# Patient Record
Sex: Female | Born: 1935 | Race: White | Hispanic: No | Smoking: Never smoker
Health system: Southern US, Community
[De-identification: ages and names within clinical notes are randomized; demographics above are authoritative.]

## PROBLEM LIST (undated history)

## (undated) DIAGNOSIS — S42109A Fracture of unspecified part of scapula, unspecified shoulder, initial encounter for closed fracture: Secondary | ICD-10-CM

## (undated) DIAGNOSIS — E119 Type 2 diabetes mellitus without complications: Secondary | ICD-10-CM

## (undated) DIAGNOSIS — R0600 Dyspnea, unspecified: Secondary | ICD-10-CM

## (undated) DIAGNOSIS — I420 Dilated cardiomyopathy: Secondary | ICD-10-CM

## (undated) DIAGNOSIS — I340 Nonrheumatic mitral (valve) insufficiency: Secondary | ICD-10-CM

## (undated) DIAGNOSIS — I429 Cardiomyopathy, unspecified: Secondary | ICD-10-CM

## (undated) DIAGNOSIS — C801 Malignant (primary) neoplasm, unspecified: Secondary | ICD-10-CM

## (undated) DIAGNOSIS — J9620 Acute and chronic respiratory failure, unspecified whether with hypoxia or hypercapnia: Secondary | ICD-10-CM

## (undated) DIAGNOSIS — D62 Acute posthemorrhagic anemia: Secondary | ICD-10-CM

## (undated) DIAGNOSIS — Z853 Personal history of malignant neoplasm of breast: Secondary | ICD-10-CM

## (undated) DIAGNOSIS — N184 Chronic kidney disease, stage 4 (severe): Secondary | ICD-10-CM

## (undated) DIAGNOSIS — S72002A Fracture of unspecified part of neck of left femur, initial encounter for closed fracture: Secondary | ICD-10-CM

## (undated) DIAGNOSIS — N185 Chronic kidney disease, stage 5: Secondary | ICD-10-CM

## (undated) DIAGNOSIS — I480 Paroxysmal atrial fibrillation: Secondary | ICD-10-CM

## (undated) DIAGNOSIS — I509 Heart failure, unspecified: Secondary | ICD-10-CM

## (undated) DIAGNOSIS — I4891 Unspecified atrial fibrillation: Secondary | ICD-10-CM

## (undated) DIAGNOSIS — R221 Localized swelling, mass and lump, neck: Secondary | ICD-10-CM

## (undated) DIAGNOSIS — N179 Acute kidney failure, unspecified: Secondary | ICD-10-CM

## (undated) DIAGNOSIS — W19XXXA Unspecified fall, initial encounter: Secondary | ICD-10-CM

## (undated) DIAGNOSIS — S2239XA Fracture of one rib, unspecified side, initial encounter for closed fracture: Secondary | ICD-10-CM

## (undated) DIAGNOSIS — I10 Essential (primary) hypertension: Secondary | ICD-10-CM

## (undated) DIAGNOSIS — E785 Hyperlipidemia, unspecified: Secondary | ICD-10-CM

## (undated) DIAGNOSIS — R7989 Other specified abnormal findings of blood chemistry: Secondary | ICD-10-CM

## (undated) DIAGNOSIS — I4821 Permanent atrial fibrillation: Secondary | ICD-10-CM

## (undated) DIAGNOSIS — I5022 Chronic systolic (congestive) heart failure: Secondary | ICD-10-CM

## (undated) HISTORY — DX: Essential (primary) hypertension: I10

## (undated) HISTORY — DX: Chronic systolic (congestive) heart failure: I50.22

## (undated) HISTORY — DX: Localized swelling, mass and lump, neck: R22.1

## (undated) HISTORY — PX: ABDOMINAL HYSTERECTOMY: SHX81

## (undated) HISTORY — DX: Heart failure, unspecified: I50.9

## (undated) HISTORY — PX: BREAST LUMPECTOMY: SHX2

## (undated) HISTORY — DX: Permanent atrial fibrillation: I48.21

## (undated) HISTORY — DX: Cardiomyopathy, unspecified: I42.9

## (undated) HISTORY — PX: FRACTURE SURGERY: SHX138

## (undated) HISTORY — DX: Nonrheumatic mitral (valve) insufficiency: I34.0

## (undated) HISTORY — DX: Chronic kidney disease, stage 4 (severe): N18.4

## (undated) HISTORY — PX: JOINT REPLACEMENT: SHX530

---
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
## 1998-01-14 ENCOUNTER — Ambulatory Visit (HOSPITAL_COMMUNITY): Admission: RE | Admit: 1998-01-14 | Discharge: 1998-01-14 | Payer: Self-pay | Admitting: Gastroenterology

## 1999-03-09 ENCOUNTER — Encounter: Admission: RE | Admit: 1999-03-09 | Discharge: 1999-03-09 | Payer: Self-pay | Admitting: General Surgery

## 1999-04-14 ENCOUNTER — Encounter: Payer: Self-pay | Admitting: *Deleted

## 1999-04-14 ENCOUNTER — Encounter: Admission: RE | Admit: 1999-04-14 | Discharge: 1999-04-14 | Payer: Self-pay | Admitting: *Deleted

## 1999-04-27 ENCOUNTER — Ambulatory Visit (HOSPITAL_COMMUNITY): Admission: RE | Admit: 1999-04-27 | Discharge: 1999-04-27 | Payer: Self-pay | Admitting: Gastroenterology

## 1999-08-30 ENCOUNTER — Encounter: Admission: RE | Admit: 1999-08-30 | Discharge: 1999-08-30 | Payer: Self-pay | Admitting: General Surgery

## 1999-08-30 ENCOUNTER — Encounter: Payer: Self-pay | Admitting: General Surgery

## 2000-08-30 ENCOUNTER — Encounter: Admission: RE | Admit: 2000-08-30 | Discharge: 2000-08-30 | Payer: Self-pay | Admitting: Ophthalmology

## 2000-08-30 ENCOUNTER — Encounter: Payer: Self-pay | Admitting: General Surgery

## 2001-03-28 ENCOUNTER — Other Ambulatory Visit: Admission: RE | Admit: 2001-03-28 | Discharge: 2001-03-28 | Payer: Self-pay | Admitting: Obstetrics and Gynecology

## 2001-09-03 ENCOUNTER — Encounter: Payer: Self-pay | Admitting: General Surgery

## 2001-09-03 ENCOUNTER — Encounter: Admission: RE | Admit: 2001-09-03 | Discharge: 2001-09-03 | Payer: Self-pay | Admitting: General Surgery

## 2002-06-17 ENCOUNTER — Encounter: Admission: RE | Admit: 2002-06-17 | Discharge: 2002-06-17 | Payer: Self-pay | Admitting: Internal Medicine

## 2002-06-17 ENCOUNTER — Encounter: Payer: Self-pay | Admitting: Internal Medicine

## 2002-09-08 ENCOUNTER — Encounter: Payer: Self-pay | Admitting: General Surgery

## 2002-09-08 ENCOUNTER — Encounter: Admission: RE | Admit: 2002-09-08 | Discharge: 2002-09-08 | Payer: Self-pay | Admitting: General Surgery

## 2003-05-23 ENCOUNTER — Emergency Department (HOSPITAL_COMMUNITY): Admission: EM | Admit: 2003-05-23 | Discharge: 2003-05-23 | Payer: Self-pay | Admitting: Emergency Medicine

## 2003-06-09 ENCOUNTER — Encounter: Admission: RE | Admit: 2003-06-09 | Discharge: 2003-06-09 | Payer: Self-pay | Admitting: Internal Medicine

## 2003-08-10 ENCOUNTER — Encounter: Admission: RE | Admit: 2003-08-10 | Discharge: 2003-08-10 | Payer: Self-pay | Admitting: Internal Medicine

## 2003-08-12 ENCOUNTER — Inpatient Hospital Stay (HOSPITAL_COMMUNITY): Admission: AD | Admit: 2003-08-12 | Discharge: 2003-08-17 | Payer: Self-pay | Admitting: Internal Medicine

## 2003-08-31 ENCOUNTER — Encounter: Admission: RE | Admit: 2003-08-31 | Discharge: 2003-08-31 | Payer: Self-pay | Admitting: Internal Medicine

## 2003-09-09 ENCOUNTER — Encounter: Admission: RE | Admit: 2003-09-09 | Discharge: 2003-09-09 | Payer: Self-pay | Admitting: General Surgery

## 2003-10-20 ENCOUNTER — Inpatient Hospital Stay (HOSPITAL_COMMUNITY): Admission: RE | Admit: 2003-10-20 | Discharge: 2003-10-27 | Payer: Self-pay | Admitting: Orthopedic Surgery

## 2004-01-22 ENCOUNTER — Ambulatory Visit (HOSPITAL_COMMUNITY): Admission: RE | Admit: 2004-01-22 | Discharge: 2004-01-22 | Payer: Self-pay | Admitting: Orthopedic Surgery

## 2004-03-01 ENCOUNTER — Ambulatory Visit: Payer: Self-pay | Admitting: Physical Medicine & Rehabilitation

## 2004-03-01 ENCOUNTER — Inpatient Hospital Stay (HOSPITAL_COMMUNITY): Admission: RE | Admit: 2004-03-01 | Discharge: 2004-03-05 | Payer: Self-pay | Admitting: Orthopedic Surgery

## 2004-09-09 ENCOUNTER — Encounter: Admission: RE | Admit: 2004-09-09 | Discharge: 2004-09-09 | Payer: Self-pay | Admitting: General Surgery

## 2005-09-11 ENCOUNTER — Encounter: Admission: RE | Admit: 2005-09-11 | Discharge: 2005-09-11 | Payer: Self-pay | Admitting: General Surgery

## 2005-10-25 ENCOUNTER — Observation Stay (HOSPITAL_COMMUNITY): Admission: EM | Admit: 2005-10-25 | Discharge: 2005-10-27 | Payer: Self-pay | Admitting: Emergency Medicine

## 2005-10-25 ENCOUNTER — Ambulatory Visit: Payer: Self-pay | Admitting: Cardiology

## 2005-10-26 ENCOUNTER — Encounter (INDEPENDENT_AMBULATORY_CARE_PROVIDER_SITE_OTHER): Payer: Self-pay | Admitting: Cardiology

## 2005-11-04 ENCOUNTER — Ambulatory Visit (HOSPITAL_COMMUNITY): Admission: RE | Admit: 2005-11-04 | Discharge: 2005-11-04 | Payer: Self-pay | Admitting: Orthopedic Surgery

## 2005-11-14 ENCOUNTER — Ambulatory Visit: Payer: Self-pay

## 2005-11-17 ENCOUNTER — Ambulatory Visit: Payer: Self-pay | Admitting: Cardiology

## 2005-11-28 ENCOUNTER — Encounter: Admission: RE | Admit: 2005-11-28 | Discharge: 2005-11-28 | Payer: Self-pay | Admitting: Gastroenterology

## 2006-01-30 ENCOUNTER — Encounter (INDEPENDENT_AMBULATORY_CARE_PROVIDER_SITE_OTHER): Payer: Self-pay | Admitting: Specialist

## 2006-01-30 ENCOUNTER — Inpatient Hospital Stay (HOSPITAL_COMMUNITY): Admission: RE | Admit: 2006-01-30 | Discharge: 2006-02-06 | Payer: Self-pay | Admitting: Surgery

## 2006-02-06 ENCOUNTER — Ambulatory Visit: Payer: Self-pay | Admitting: Hematology and Oncology

## 2006-02-15 LAB — CBC WITH DIFFERENTIAL/PLATELET
BASO%: 0.7 % (ref 0.0–2.0)
Basophils Absolute: 0.1 10*3/uL (ref 0.0–0.1)
EOS%: 2.5 % (ref 0.0–7.0)
Eosinophils Absolute: 0.2 10*3/uL (ref 0.0–0.5)
HCT: 33.7 % — ABNORMAL LOW (ref 34.8–46.6)
LYMPH%: 25.3 % (ref 14.0–48.0)
MCHC: 34.2 g/dL (ref 32.0–36.0)
MCV: 84.1 fL (ref 81.0–101.0)
MONO#: 0.6 10*3/uL (ref 0.1–0.9)
MONO%: 7.5 % (ref 0.0–13.0)
RDW: 14.3 % (ref 11.3–14.5)
WBC: 8.4 10*3/uL (ref 3.9–10.0)
lymph#: 2.1 10*3/uL (ref 0.9–3.3)

## 2006-02-15 LAB — CEA: CEA: <0.5 ng/mL (ref 0.0–5.0)

## 2006-02-15 LAB — COMPREHENSIVE METABOLIC PANEL
ALT: <8 U/L (ref 0–40)
Albumin: 3.8 g/dL (ref 3.5–5.2)
BUN: 21 mg/dL (ref 6–23)
CO2: 19 mEq/L (ref 19–32)
Glucose, Bld: 95 mg/dL (ref 70–99)
Total Bilirubin: 0.4 mg/dL (ref 0.3–1.2)

## 2006-04-08 ENCOUNTER — Inpatient Hospital Stay (HOSPITAL_COMMUNITY): Admission: EM | Admit: 2006-04-08 | Discharge: 2006-04-12 | Payer: Self-pay | Admitting: Emergency Medicine

## 2006-05-24 ENCOUNTER — Ambulatory Visit: Payer: Self-pay | Admitting: Hematology and Oncology

## 2006-06-06 ENCOUNTER — Encounter: Admission: RE | Admit: 2006-06-06 | Discharge: 2006-06-06 | Payer: Self-pay | Admitting: Internal Medicine

## 2006-07-20 ENCOUNTER — Inpatient Hospital Stay (HOSPITAL_COMMUNITY): Admission: RE | Admit: 2006-07-20 | Discharge: 2006-07-27 | Payer: Self-pay | Admitting: Orthopaedic Surgery

## 2007-05-17 ENCOUNTER — Ambulatory Visit: Payer: Self-pay | Admitting: Cardiology

## 2010-06-19 ENCOUNTER — Encounter: Payer: Self-pay | Admitting: Hematology and Oncology

## 2010-06-19 ENCOUNTER — Encounter: Payer: Self-pay | Admitting: Gastroenterology

## 2010-10-11 NOTE — Assessment & Plan Note (Signed)
[facility] OFFICE NOTE   [name], [name]                        MRN:          [id number]  DATE:05/17/2007                            DOB:          1925/11/21    [name] is seen today for cardiac evaluation and cardiac clearance  for orthopedic surgery to her leg.  I had seen the patient in the past.  She had had some shortness of breath.  We were concerned, and ischemia  was ruled out.  She had a 2D echocardiogram in May 2007 showing good LV  function.  She also had a Myoview scan revealing no evidence of  ischemia.  She is not currently having any chest pain or shortness of  breath.  She is limited only by the discomfort in her left leg for which  she needs surgery.   ALLERGIES:  CODEINE.   MEDICATIONS:  Actonel, Celebrex, iron, metformin, multivitamin,  Micardis, hydrochlorothiazide, Carvedilol, Actos, Cardizem CD, and  lisinopril.   OTHER MEDICAL PROBLEMS:  See the list below.   REVIEW OF SYSTEMS:  As of today a Review of Systems is negative.  She is  not having any significant problems other than the leg discomfort  mentioned in the HPI.   PHYSICAL EXAMINATION:  VITAL SIGNS:  Blood pressure 170/70.  This is her  usual range, and she is on multiple medications.  Pulse 81.  GENERAL:  [name] oriented to person, time, and place.  Affect is  normal.  She is here with a family member today.  LUNGS:  Clear.  Respiratory effort is not labored.  CARDIAC:  Reveals S1, S2.  There are no clicks or significant murmurs.  ABDOMEN:  Soft.  There are no masses or bruits.  EXTREMITIES:  There is no peripheral edema.   EKG shows interventricular conduction delay and decreased anterior R  wave progression.  These are old, and there is no significant change.   PROBLEMS:  1. Hypertension being treated.  2. Diabetes treated.  3. History of breast cancer with lumpectomy and tamoxifen in the past.  4.  History of diverticulosis.  5. Osteoporosis.  6. History of uterine cancer status post hysterectomy.  7. Normal LV function by echocardiogram and no sign of ischemia by      nuclear scan.  8. Need for orthopedic surgery to her left leg.   The patient's cardiac status is stable.  She is cleared for surgery.     Carlena Bjornstad, MD, [facility]  Electronically Signed    JDK/MedQ  DD: 05/17/2007  DT: 05/18/2007  Job #: [id number]   cc:   Ardeen Jourdain, M.D.  Tacey Ruiz, M.D.

## 2010-10-14 NOTE — Op Note (Signed)
NAMEINDIYA, DARITY               ACCOUNT NO.:  [id number]   MEDICAL RECORD NO.:  [id number]          PATIENT TYPE:  AMB   LOCATION:  SDS                          FACILITY:  [facility]   PHYSICIAN:  Lind Guest. Ninfa Linden, M.D.DATE OF BIRTH:  July 12, 1925   DATE OF PROCEDURE:  07/24/2006  DATE OF DISCHARGE:                               OPERATIVE REPORT   PREOPERATIVE DIAGNOSIS:  Left distal femur fracture, nonunion status  post open reduction and internal fixation.   POSTOPERATIVE DIAGNOSIS:  Left distal femur fracture, nonunion status  post open reduction and internal fixation.   PROCEDURE:  1. Antibiotic bead removal, left distal femur fracture.  2. Irrigation and debridement of left distal femur fracture nonunion.  3. Revision osteosynthesis right distal femur fracture using Synthes      4.5 mm medial locking plate.  4. Allograft bone placed on the left distal femur fracture using      Actifuse 15 mL putty and OP1.   SURGEON:  Lind Guest. Ninfa Linden, M.D.   ANESTHESIA:  General.   ANTIBIOTICS:  600 mg IV clindamycin.   BLOOD LOSS:  250 mL.   COMPLICATIONS:  None.   INDICATIONS:  Briefly, Ms. Nakao is a 77 year old female who, back in  November, sustained an open left distal femur fracture that was a  periprosthetic fracture.  She underwent irrigation, debridement, open  reduction, and internal fixation.  At three months, she had collapsed  into a varus position in spite of a large periarticular locking plate  and showed no evidence of healing.  I took her to the operating room  four days ago where she underwent irrigation and debridement at the  fracture site and I found a large seroma. Cultures were taken at the  fracture site of the bone and the fluid as well as the knee and after  four days, all of these were negative.  There was rare white blood  cells, no organisms, and the cultures grew back negative.  She is  returning for repeat irrigation and debridement of the  wound and  possible plating depending on the findings. She has, otherwise, been  doing well and consents for surgery.   PROCEDURE DESCRIPTION:  After informed consent was obtained, the  appropriate left leg was marked, Ms. Owusu was brought to the operating  room and placed supine on the operating table.  General anesthesia was  then obtained. Her leg was prepped and draped with DuraPrep and sterile  drapes down to the ankle and a sterile stockinette was used. I placed a  sterile tourniquet around the leg at the upper thigh and the tourniquet  was inflated to 300 mmHg.  I went through the previous medial incision  and removed the staples.  I dissected down through the vastus medialis  and took this down as a flap. The muscle was very viable and contractile  with electrocautery.  I did encounter a large hematoma from my previous  surgery.  This was irrigated with pulsatile lavage and normal saline  solution to free up the blood clot. I did find some bleeders that were  cauterized. I then continued to have oozing and this was felt to be a  venous tourniquet so I removed the tourniquet from the leg in its  entirety after letting it down.  Hemostasis was then easily obtained.  I  then copiously irrigated the wound again with 3 liters normal saline  solution followed by 500 mL of bacitracin solution.  I curetted the  fracture site again and I did actually get some bleeding bone.  Once  this was accomplished, I was able to dissect further medial off the bone  trying to keep as much periosteum intact.  Again, I found no evidence of  gross purulence at the fracture site.  I next chose a narrow 4.5 mm  Synthes locking plate and used the plate benders to contour this to the  medial side of the distal femur.  This all verified under direct  fluoroscopic guidance.  Once I felt that the plate was an appropriate  fit, holding the leg straight, I worked on the fracture site further  with curetting. I  then packed the fracture site with 15 mL Actifuse bone  graft mixed with OP1 for stimulus. The plate was secured with three  locking screws proximally and two locking screws distally as well as a  cortical screw.  All screws got good purchase of the bone and under  direct fluoroscopic guidance, I had much improvement in the alignment of  the leg overall. There appeared to be fracture stability with range of  motion of the knee.  I then reapproximated the vastus medialis to the  quad tendon as a flap with interrupted #1 Vicryl suture followed by 0  Vicryl and the fascia over the vastus medialis, 2-0 Vicryl in the  subcutaneous tissue, and staples on the skin.  Xeroform followed by a  well padded sterile dressing were applied and the knee was placed in a  knee immobilizer. The foot remained perfused throughout the case with  the tourniquet down.  The patient was then awakened, extubated, and  taken to the recovery room in stable condition.  There were no  complications.  Final blood count was 250 mL and all final counts were  correct.  Postoperatively, I will have her non-weight bearing and I will  place her in a Bledsoe hinged knee brace and begin the rehabilitative  process.           ______________________________  Lind Guest. Ninfa Linden, M.D.     CYB/MEDQ  D:  07/24/2006  T:  07/24/2006  Job:  [id number]

## 2010-10-14 NOTE — Op Note (Signed)
[name], [name]                           ACCOUNT NO.:  [id number]   MEDICAL RECORD NO.:  [id number]                   PATIENT TYPE:  INP   LOCATION:  5041                                 FACILITY:  [facility]   PHYSICIAN:  Anderson Malta, M.D.                 DATE OF BIRTH:  [birth year]   DATE OF PROCEDURE:  10/20/2003  DATE OF DISCHARGE:                                 OPERATIVE REPORT   PREOPERATIVE DIAGNOSIS:  Right knee arthritis.   POSTOPERATIVE DIAGNOSIS:  Right knee arthritis.   PROCEDURE:  Right total knee replacement.   SURGEON:  Anderson Malta, M.D.   ASSISTANT:  Emogene Morgan. Lavender, M.D.   ESTIMATED BLOOD LOSS:  150 mL.   DRAINS:  None.   TOURNIQUET TIME:  1 hour and 39 minutes at 300 mmHg.   COMPONENTS:  Posterior stabilized size 5 patella, 7 femur, 7 tibia with 10  mm spacer.   DESCRIPTION OF PROCEDURE:  The patient was brought to the operating room  where general endotracheal anesthesia was induced.  Preoperative IV  antibiotics were administered.  The right leg was prepped with duraprep  solution and draped in a sterile manner, Ioban was used to cover the  operative field.  The leg was elevated and exsanguinated with the esmarch  wrap, tourniquet was inflated.  An anterior midline incision was made, skin  and subcutaneous tissue was sharply divided, a median parapatellar  arthrotomy was made. The precise location of this arthrotomy was marked with  #1 Vicryl suture a superior medial border of the patella.  The patella was  then everted, fat pad was partially excised, the patellofemoral ligament was  released, elevation of the medial soft tissue sleeve was performed and soft  tissue was cleared from the anterior distal aspect of the femur for  visualization of the anterior cut.  Intramedullary alignment was then used  in order to make the initial distal femoral cut which was placed at 5  degrees valgus.  Following distal femoral resection, the chamfer cuts were  made, the femur was sized to a size 7, chamfer cutting block was then placed  and the anterior posterior chamfer cuts were made.  At this time, a leveling  cut on the tibia was made, intramedullary alignment was used to make a cut  on the tibia perpendicular to the mechanical axis of the tibial shaft.  The  cut was made, spacing block in extension  __________ indicated adequate cut.  At this time, a box cut was made on the femur. The patella was then measured  at 20 mm thickness, 10 mm resection was then performed. A size patella trial  was placed, keel punch on the tibia was performed after trial components and  rotations were marked. The trial components then positioned, the patella had  excellent tracking with __________ technique and good overall mechanical  appearance and had excellent range  of motion with no lift off or full  flexion. Good collateral ligament clearly was noted in both full extension  and full flexion.  At this time, the trial components were removed.  The  knee joint was thoroughly irrigated. The components were cemented into  position beginning with 5/7 tibia, 7 femur, 5 patella, followed by 10  crossfire polyethylene spacer. At this time, the tourniquet was released,  bleeding points were counter controlled using electrocautery.  The  polyethylene spacer was removed as there did appear to be bleeding from the  posterior aspect of the knee.  With the poly removed, there was no active  arterial bleeding seen as the bleeding was coming from the intramedullary  femoral alignment pole. This was packed with bone graft.  A second  polyethylene spacer was placed. The knee was then again thoroughly irrigated  and closed over a Hemovac drain using #1 Vicryl figure-of-eight sutures  followed interrupted inverted 2-0 Vicryl sutures and skin staples.  The  patient was then placed in a bulky dressing and knee immobilizer.  She  tolerated the procedure well without immediate  complications.                                               Anderson Malta, M.D.    GSD/MEDQ  D:  10/20/2003  T:  10/20/2003  Job:  [id number]

## 2010-10-14 NOTE — H&P (Signed)
NAMERHEMY, LANNOM               ACCOUNT NO.:  [id number]   MEDICAL RECORD NO.:  [id number]          PATIENT TYPE:  EMS   LOCATION:  MAJO                         FACILITY:  [facility]   PHYSICIAN:  Cherene Altes, M.D.DATE OF BIRTH:  1925-07-13   DATE OF ADMISSION:  10/25/2005  DATE OF DISCHARGE:                                HISTORY & PHYSICAL   PRIMARY CARE PHYSICIAN:  Ardeen Jourdain, M.D.   CHIEF COMPLAINT:  Dyspnea on exertion.   HISTORY OF PRESENT ILLNESS:  Ms. [name] is a very pleasant 77-year-  old female who lives independently here in [place].  She was in her usual  state of health until this morning.  She was up working around the house  when she experienced the abuse onset of severe shortness of breath.  There  was no associated chest pain or pressure.  She just simply felt that she  could not catch her breath.  This made her also feel very anxious.  She  denies diaphoresis.  She denies nausea or vomiting.  She does report that  she possibly has some numbness in the left arm, but states that she has been  having lots of shoulder trouble on the left for the last few months, which  is being evaluated by her orthopedist.  Her shortness of breath persisted  despite her sitting down.  She then summoned her family, who ultimately  presented with her to [facility] emergency room.  After approximately 3  hours, the patient's symptoms resolved spontaneously.  No medical  intervention in the emergency room changed the course of her symptoms  whatsoever.  There was no associated nausea or vomiting.  There has been no  abdominal pain, no low back pain.  There have been no focal neurologic  deficits.  The patient has not had recent similar symptoms.  She has noted  no lower extremity edema.  As previously stated, she has been up and around  and highly functional without any difficulty until onset of symptoms this  morning.   REVIEW OF SYSTEMS:  Comprehensive review  of systems is unremarkable, with  the exception of the positive ailments noted in the history of present  illness above.   PAST MEDICAL HISTORY:  1.  Hypertension.  2.  Diabetes mellitus.  3.  History of breast cancer status post lumpectomy and Tamoxifen with no      requirement for mastectomy or chemotherapy or radiation.  4.  Diverticulosis.  5.  Osteoporosis/osteoarthritis status post right total knee arthroplasty in      May of 2005 and left total knee arthroplasty in October of 2005.  6.  Status post hysterectomy secondary to uterine cancer.   MEDICATIONS:  1.  AcipHex 20 mg p.o. daily.  2.  Micardis 40 mg p.o. daily.  3.  Cardizem LA 240 mg daily.  4.  Actonel dose unknown.  5.  Metformin 1000 mg b.i.d.  6.  Celebrex 200 mg daily.  7.  Darvocet-N 100/650 one q.h.s.  8.  Iron sulfate 324 mg b.i.d.  9.  Actos 30 mg daily.  ALLERGIES:  CODEINE.   FAMILY HISTORY:  Noncontributory secondary to advanced age.   SOCIAL HISTORY:  [name] a widow.  She lives in [place] in her own  home.  She does have a 88-something-year-old grandson who lives with her,  but his assistance is not required for her day-to-day living.  She does not  smoke, nor has she ever.  She does not drink alcohol.   DATA REVIEWED:  D-dimer is normal.  pH of 7.55, PCO2 of 26, PO2 of 106.  Sodium, potassium, chronic, bicarbonate, BUN, creatinine and serum glucose  are all normal.  Point of care cardiac markers are negative x2.  Chest x-ray  reveals no acute disease as reviewed by this examiner.  A 12-lead  electrocardiogram reveals T wave inversions in leads III and aVF.  These  were not present on a previous electrocardiogram dating February 25, 2004.  Otherwise, there is no evidence of ST elevation or depression on review of  the electrocardiogram.   PHYSICAL EXAMINATION:  VITAL SIGNS:  Temperature of 97.4, blood pressure  188/91, heart rate 88, respiratory rate 18, O2 saturation 98% on room  air.  GENERAL:  Well-developed, well-nourished female in no acute respiratory  distress at the present time.  HEENT:  Normocephalic and atraumatic.  Pupils equal, round and reactive to  light and accommodation.  Extraocular muscles intact bilaterally.  OC/OP  clear.  NECK:  No JVD, no lymphadenopathy.  LUNGS:  Clear to auscultation bilaterally without wheezes or rhonchi.  CARDIOVASCULAR:  Regular rate and rhythm without murmur, gallop, or rub with  normal S1 and S2.  ABDOMEN:  Thin, nontender, nondistended, soft.  Bowel sounds present.  No  hepatosplenomegaly, no rebound, no ascites.  EXTREMITIES:  No significant cyanosis, clubbing, edema in bilateral lower  extremities.  NEUROLOGIC:  Alert and oriented x4.  There was 5/5 strength in bilateral  upper and lower extremities.  Intact sensation to touch throughout.  No  Babinski.   IMPRESSION AND PLAN:  1.  Dyspnea on exertion.  Ms. [name] has multiple risk factors for      coronary artery disease to include advanced age, hypertension and      diabetes.  Her lipid status is not known.  My concern is the patient's      dyspnea represents an angina equivalent in this 77 year old diabetic.      Given her electrocardiogram changes, I feel it is prudent to admit the      patient and to rule her out.  Given evident of left ventricular      hypertrophy on electrocardiogram, I will also obtain an echocardiogram.      This will further provide information useful in risk stratification for      coronary artery disease.  If there is evidence of a wall motion      abnormality or if cardiac enzymes prove to be positive, will consult      cardiology for possible inpatient risk stratification.  The patient will      be treated with aspirin.  There is no evidence of acute heart failure,      and therefore she will also be treated with a beta blocker.  In that D-     dimer, the likelihood of a pulmonary embolism is exceedingly low.       Furthermore, my pretest probability for pulmonary embolism is low in      this patient, who is very active.  She will be treated with  Lovenox for      DVT prophylaxis, but I do not feel that a spiral CT is warranted at the      present time.  2.  Uncontrolled hypertension.  I suspect that the patient's hypertension is      presently elevated because of significant anxiety related to her      presence in the emergency room and her symptoms today.  She admits to      feeling real anxious about her present status.  I will add a beta      blocker as noted above for the possibility of an acute pulmonary      syndrome.  I will not, however, adjust her antihypertensive regimen      further.  Will keep her on her usual home regimen, and we will follow      her blood pressure trend during this hospital stay.  We will avoid acute      over-correction.  3.  Diabetes mellitus.  The patient's serum glucose is 76 at the present      time.  She does report that she is closely adherent to a diabetic diet.      We will check a hemoglobin A1C to assess if adjustment of her baseline      regimen is required.  She will be covered with her baseline medications      plus sliding scale insulin.  4.  History of anemia.  The patient does report that she has been seen by a      gastroenterologist and has had a colonoscopy within the last 2 years.      She is on iron therapy, but denies any history of GI bleeding.  I will      check a CBC, and further evaluation will be carried out as appropriate.      Cherene Altes, M.D.  Electronically Signed     JTM/MEDQ  D:  10/25/2005  T:  10/25/2005  Job:  [id number]   cc:   Ardeen Jourdain, M.D.  Fax: [phone]

## 2010-10-14 NOTE — Op Note (Signed)
[name], [name]               ACCOUNT NO.:  [id number]   MEDICAL RECORD NO.:  [id number]          PATIENT TYPE:  INP   LOCATION:  0005                         FACILITY:  [facility]   PHYSICIAN:  Isabel Caprice. Hassell Done, MD  DATE OF BIRTH:  [birth year]   DATE OF PROCEDURE:  01/30/2006  DATE OF DISCHARGE:                                 OPERATIVE REPORT   PREOPERATIVE DIAGNOSIS:  Gastric adenocarcinoma.   POSTOPERATIVE DIAGNOSIS:  Gastric adenocarcinoma along greater curvature   PROCEDURE:  Laparoscopy, open partial gastrectomy and endoscopy.   SURGEON:  Isabel Caprice. Hassell Done, MD   ASSISTANT:  Jonne Ply, MD.   ANESTHESIA:  General endotracheal.   ESTIMATED BLOOD LOSS:  Minimal.   DESCRIPTION OF PROCEDURE:  [name] is a 77 year old white female who  presented with nausea and vomiting and had endoscopy by Dr. Oletta Lamas who  biopsied a mass along the greater curvature which proved to be an adenoca.  CT scan did not show much of the mass of the greater curvature.  The  possibility of some gallstones seen on the study and we discussed possibly  doing a cholecystectomy if the gallbladder appeared to have had chronic  inflammatory changes.   The patient was taken to room one, given general anesthesia.  I entered the  abdomen through the left upper quadrant with an OptiView and found a lot of  adhesions in the left upper quadrant well away from any previous surgery.  I  went ahead and put in multiple other ports and began trying to tease these  down, but they were very densely adherent and I did look up on the stomach  and could not obviously see the tumor, and felt at this point I was going to  need and go ahead and palpate her stomach, so I elected to make a midline  incision and take down her adhesions.  There was a little serosal tear of  the transverse colon which I repaired with interrupted 4-0 Vicryl sutures.  I palpated the stomach and felt like I could feel the mass. I went  ahead and  broke out scrub, passed the endoscope from above and did an upper endoscopy  and saw the mass on the greater curvature and we put a suture through it.   I then did decompress the stomach and withdrew the scope and from the  outside, got a palpable idea of the size of this mass and went along from  the greater curvature, taking it down with harmonic scalpel and then I did a  gastric resection using multiple applications using the Endo-GIA with the  bronze cartridge.  The specimen was opened on the table and it appeared to  have good margins around this.  It was sent for permanent sections.  Stomach  was not bleeding.  I looked at the gallbladder and was a nice robin's egg  blue with no adhesions whatsoever.  No palpable stones were noted.  I  elected not to do a cholecystectomy.   Sponge and needle counts were reported as correct.  The wound was closed  with #  1 PDS from above and below and tied in the middle.  The wounds were  irrigated and closed with staples.  The patient tolerated the procedure well  and was taken to the recovery room in satisfactory condition.      Isabel Caprice Hassell Done, MD  Electronically Signed     MBM/MEDQ  D:  01/30/2006  T:  01/30/2006  Job:  [id number]   cc:   Ardeen Jourdain, M.D.  Fax: Harrells Rolla Flatten., M.D.  Fax: [phone]

## 2010-10-14 NOTE — Op Note (Signed)
NAMEHALLEL, MELLAND               ACCOUNT NO.:  [id number]   MEDICAL RECORD NO.:  [id number]          PATIENT TYPE:  INP   LOCATION:  [id number]                         FACILITY:  [facility]   PHYSICIAN:  Anderson Malta, M.D.    DATE OF BIRTH:  [birth year]   DATE OF PROCEDURE:  03/01/2004  DATE OF DISCHARGE:                                 OPERATIVE REPORT   PREOPERATIVE DIAGNOSIS:  Left knee arthritis.   POSTOPERATIVE DIAGNOSIS:  Left knee arthritis.   PROCEDURE:  Left total knee replacement using Osteonics cemented posterior-  stabilized femur, size 7 cemented tibia, 12 mm __________ polyethylene  spacer, and size 5 patella.   SURGEON:  Anderson Malta, M.D.   ASSISTANT:  Emogene Morgan. Laurina Bustle, M.D.   ANESTHESIA:  General endotracheal.   ESTIMATED BLOOD LOSS:  150 mL.   DRAINS:  Hemovac x1.   TOURNIQUET TIME:  1 hour 31 minutes at 300 mmHg.   PROCEDURE IN DETAIL:  The patient was brought to the operating room, where  general endotracheal anesthesia was induced, preoperative IV antibiotics  were administered, and the left leg was prepped with Duraprep solution and  draped in a sterile manner.  It was covered with an [place].  The leg was  elevated and exsanguinated with the Esmarch wrap.  The tourniquet was  inflated.  An anterior approach to the knee was utilized with the knee over  a bolster.  A median parapatellar arthrotomy was made.  The precise location  of this arthrotomy was marked with a #1 Vicryl suture.  The patellofemoral  ligament was released.  Osteophytes were removed from the femur and tibia.  ACL and PCL were removed.  The fat pad was partially excised.  Soft tissue  was elevated from the posteromedial aspect of the tibial plateau back to the  semimembranosus bursa.  Soft tissue was removed from the anterior distal  aspect of the femur.  Distal cut of 12 mm was then made and at 5 degrees of  valgus.  The chamfer cutting guide was then placed for a size 7 femur, which  did  not match.  The guide was placed parallel to the epicondylar axis.  Chamfer and condylar cuts were then made and the collateral ligaments  protected.  The tibia was then subluxated forward with the posterior  retractor.  The tibial cut was then made by making a 2 mm resection for  release of the affected lateral plateau.  This cut was made perpendicular to  the mechanical axis using intramedullary alignment.  A box cut was then made  on the femur, the PCL remnant was excised.  Posterior stripping of the  capsule was performed.  Trial components were placed and the patient noted  to have full extension with the 12 mm spacer in place with excellent  collateral stability at 0, 30, and 90 degrees.  At this time the tibial keel  punch was performed after rotation was marked.  The patella was then  repaired by making a free hand cut of 10 mm off the 22 mm thick patella.  A  size 5 trial button was placed, and again the patella had excellent tracking  and a good range of motion with no lift-off.  Trial components were removed.  True components were then cemented into position with excess cement removed.  Cement was allowed to harden.  The knee was again taken through a range of  motion and found to have excellent stability and patellar tracking.  At this  time the tourniquet was released, bleeding points encountered were  controlled using electrocautery.  A Hemovac drain was placed.  Thorough  irrigation was performed.  The 12 mm true spacer was placed.  It was placed  onto the dry tibial base plate.  At this time the medial patellar arthrotomy  was closed using interrupted #1 Vicryl sutures.  The skin was closed using  interrupted inverted 2-0 Vicryl suture and skin staples.  The patient was  placed in a knee immobilizer.  She tolerated the procedure well without  immediate complications.       GSD/MEDQ  D:  03/02/2004  T:  03/02/2004  Job:  [id number]

## 2010-10-14 NOTE — H&P (Signed)
[name], YAMBOR                           ACCOUNT NO.:  [id number]   MEDICAL RECORD NO.:  [id number]                   PATIENT TYPE:  INP   LOCATION:  5703                                 FACILITY:  [facility]   PHYSICIAN:  Ardeen Jourdain, M.D.            DATE OF BIRTH:  [birth year]   DATE OF ADMISSION:  08/12/2003  DATE OF DISCHARGE:                                HISTORY & PHYSICAL   CHIEF COMPLAINT:  Shortness of breath and cough.   HISTORY OF PRESENT ILLNESS:  This is a 77 year old female who was seen in  the primary care setting on March 14 and diagnosed with community-acquired  pneumonia.  She was sent home on Levaquin and has not gotten any better.  The family calls today with continued problems with weakness and shortness  of breath.  As discussed in the office previously we are going to admit her  for IV antibiotics and hydration as she failed outpatient treatment for  community-acquired pneumonia.  She continues to be short of breath, cough,  slight purulent production, poor appetite, weakness.  She denies any chest  pain.   REVIEW OF SYSTEMS:  Positive for shortness of breath, cough, sputum  production.  She denies any chest pain, dysuria, melena, abdominal pain,  rashes, musculoskeletal complaints.  Ten plus systems were reviewed with the  patient.   PAST MEDICAL HISTORY:  Significant for breast cancer, diabetes mellitus type  2, hypertension, diverticulosis and osteopenia.   PAST SURGICAL HISTORY:  The patient had a hysterectomy.   SOCIAL HISTORY:  She is a widow.  She lives with her child.  Nonsmoker,  nondrinker.   ALLERGIES:  CODEINE.   FAMILY HISTORY:  Noncontributory.   MEDICATIONS:  1. Celebrex 200 mg p.o. daily.  2. Metformin 500 mg p.o. b.i.d.  3. Glyburide 5 mg p.o. daily.  4. Elavil 25 mg q.h.s.  5. Actonel 35 mg q. week.   PHYSICAL EXAMINATION:  VITAL SIGNS:  Temperature 98.4.  Blood pressure  150/80. Pulse 80.  Weight 140 pounds.  GENERAL:   The patient was awake, oriented, mild respiratory distress.  HEENT:  Pupils, equal, round and reactive to light.  The extraocular motion  intact.  Supple neck, no JVD, no bruit, no thyromegaly.  LUNGS:  Decreased breath sounds, right base.  No wheezing noted.  HEART:  Slightly tachycardic without murmur, gallop or rub.  ABDOMEN:  Soft, nondistended and nontender, positive bowel sounds.  No  hepatosplenomegaly.  MUSCULOSKELETAL:  Grossly within normal limits.  No clubbing, cyanosis or  edema noted.  NEUROLOGIC:  Grossly nonfocal.   REVIEW OF STUDIES:  Outpatient x-rays showed right lower lobe infiltrate and  early left lower lobe infiltrate.   IMPRESSION/PLAN:  [name] is being admitted for IV Zosyn and IV fluids  after failing outpatient therapy for community-acquired pneumonia.  We will  get routine laboratory studies on the patient.  We will hold off  on blood  cultures since she has received two days of Levaquin. We will get a followup  chest x-ray in the morning and start her on breathing treatments and IV  Zosyn and Mucinex to loosen secretions.  We will start her on 2 liters nasal  cannula and get routine lab studies on the patient.                                                Ardeen Jourdain, M.D.    DEH/MEDQ  D:  08/12/2003  T:  08/14/2003  Job:  [id number]

## 2010-10-14 NOTE — Discharge Summary (Signed)
[name], [name]               ACCOUNT NO.:  [id number]   MEDICAL RECORD NO.:  [id number]          PATIENT TYPE:  INP   LOCATION:  5002                         FACILITY:  [facility]   PHYSICIAN:  Lind Guest. Ninfa Linden, M.D.DATE OF BIRTH:  June 23, 1925   DATE OF ADMISSION:  04/08/2006  DATE OF DISCHARGE:  04/12/2006                               DISCHARGE SUMMARY   ADMITTING DIAGNOSIS:  Left open distal femur periarticular fracture  (around total knee replacement).   DISCHARGE DIAGNOSIS:  Left open distal femur periarticular fracture  (around total knee replacement).   PROCEDURES:  1. Irrigation and debridement of left open distal femur fracture.  2. Open reduction and internal fixation of left open periarticular      distal femur fracture using a locking plate.   HOSPITAL COURSE:  Briefly, Ms. Halseth is a 77 year old female with a  history of bilateral total knee replacement.  She was ambulating at the  [facility] on the day of admission when she sustained a mechanical fall.  She did not have a syncopal episode.  She said she just actually lost  her footing.  She sustained a periarticular distal femur fracture around  her total knee replacement.  This was an open fracture and was taken by  EMS to [facility] Emergency Department.  Assessment was made, and she  was found to have at least a 2 x 2-cm open wound over her anterior thigh  with protruding exposed bone.  It was recommended she undergo internal  fixation due to the nature of the fracture.  She was taken to the  operating room on the day of admission where she underwent the  aforementioned procedures.  She tolerated this well without  complications.  Postoperatively, she was admitted to the hospital for  physical therapy as well as occupational  therapy and IV antibiotics due  to the open nature of her fracture.  By the day of discharge, she was  working well with therapy with non weightbearing on her left leg with  minimal  motion of her knee.  The wound was found to be clean, dry, and  intact.   DISPOSITION:  Home.   DISCHARGE INSTRUCTIONS:  While she is at home, she will keep a close eye  over her incisions and make sure this stays clean, dry, and intact.  I  will let home health physical therapy work with her for just ambulation  and gait training with no weightbearing on her left leg until further  notice.  A followup appointment will be established in two weeks in the  office.   DISCHARGE MEDICATIONS:  1. See medical reconciliation sheet for continuing on home medications      as before.  2. Darvocet for pain as stated.  3. Robaxin for pain as needed.  4. Take 1 aspirin 325 mg daily until further notice.     Lind Guest. Ninfa Linden, M.D.  Electronically Signed    CYB/MEDQ  D:  08/04/2006  T:  08/04/2006  Job:  [id number]

## 2010-10-14 NOTE — Discharge Summary (Signed)
NAMESAMORA, HAUX               ACCOUNT NO.:  [id number]   MEDICAL RECORD NO.:  [id number]          PATIENT TYPE:  INP   LOCATION:  [place]                         FACILITY:  [facility]   PHYSICIAN:  Isabel Caprice. Hassell Done, MD  DATE OF BIRTH:  1925/06/14   DATE OF ADMISSION:  01/30/2006  DATE OF DISCHARGE:  02/06/2006                                 DISCHARGE SUMMARY   CCS#:  [id number]   PROCEDURE:  January 30, 2006 laparoscopy, open, partial gastrectomy and  endoscopy.   DISCHARGE DIAGNOSES:  Gastric adenocarcinoma along the greater curvature  status post resection. Moderately differentiated margins free of tumor.  Seven perigastric lymph nodes free of tumor.   HOSPITAL COURSE:  [name] is a 77 year old lady who had the above  mentioned lesion identified by Dr. Oletta Lamas on endoscopy for nausea. She  underwent the above mentioned operation and did well. She was moved up to  the floor where she had a little bit of trouble with some nausea that  progressed and by postoperative day 7 was ready to go home. She was seen by  Dr. Gunnar Bulla Magrinat and arrangements were made for her to followup with Dr.  Sophronia Simas. She was given prescriptions for Darvocet and Phenergan  tablets at the time of discharge and instructed to return to the office in 2-  3 weeks. Her staples were removed. Her condition is good.      Isabel Caprice Hassell Done, MD  Electronically Signed     MBM/MEDQ  D:  02/06/2006  T:  02/07/2006  Job:  [id number]   cc:   Ardeen Jourdain, M.D.  Fax: [place]. Odogwu, M.D.  Fax: [place]. Rolla Flatten., M.D.  Fax: [phone]

## 2010-10-14 NOTE — Op Note (Signed)
[name], [name]               ACCOUNT NO.:  [id number]   MEDICAL RECORD NO.:  [id number]          PATIENT TYPE:  INP   LOCATION:  2550                         FACILITY:  [facility]   PHYSICIAN:  Lind Guest. Ninfa Linden, M.D.DATE OF BIRTH:  Aug 04, 1925   DATE OF PROCEDURE:  07/20/2006  DATE OF DISCHARGE:                               OPERATIVE REPORT   PREOPERATIVE DIAGNOSIS:  Left distal femur fracture nonunion, status  post open reduction and internal fixation of open fracture.   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   PROCEDURE:  1. Irrigation and debridement of left distal femur nonunion site.  2. Antibiotic bead placement in left distal femur fracture site.   SURGEON:  Lind Guest. Ninfa Linden, M.D.   ASSISTANT:  Epimenio Foot, P.A.-C.   ANESTHESIA:  General.   BLOOD LOSS:  Minimal.   ANTIBIOTICS:  1 gram IV Ancef followed by 1 gram IV vancomycin after  cultures obtained.   FINDINGS:  Large seroma collection and obvious nonunion.  Cultures  pending.   COMPLICATIONS:  None.   INDICATIONS:  Briefly, [name] is a 77 year old who sustained an open  left distal femur fracture in November of this past year.  She underwent  a thorough irrigation and debridement and then plating of the distal  femur.  This was a periprosthetic fracture just proximal to a total knee  replacement.  She had been doing well postoperatively, with slowly  advancing her weightbearing status with walking with a walker.  At an 8-  week standpoint x-rays were obtained and still showed anatomic alignment  and mild evidence of healing.  I decided to advance her weightbearing  status hoping this would help her lay down new bone as well.  She  returned to the office this week complaining of knee pain but no thigh  pain, but she was ambulating with an obvious varus deformity at her  knee.  X-rays did show a nonunion of the  fracture site and varus  collapse as well.  I talked to her and the family at length about the  possibility that this may be an infectious nonunion and brought her to  the operating room for assessment of the fracture site with potential  for bone grafting and medial plating depending on what the finding of  surgery are.  The risks and benefits of this were explained.  The  patient well-understood, and she and her family agreed to proceed with  surgery.   DESCRIPTION OF PROCEDURE:  After informed consent was obtained, the  appropriate left leg was marked.  [name] was brought to the  operating room and placed supine on the operating table.  General  anesthesia was then obtained.  Her left leg was prepped and draped from  the thigh down to the ankle with DuraPrep and sterile drapes.  A sterile  stockinette was used as well as a sterile tourniquet.  The tourniquet  was placed high on the thigh, and then the leg was wrapped with Esmarch.  The tourniquet was inflated to 300 mmHg pressure.  A time-out was  called, and she was identified as the correct patient and the correct  extremity.   I then took a medial approach to the distal femur and tried to keep my  incisions 6 to 7 cm from the previous midline and lateral incisions.  As  I dissected down to the vastus, I then teased the vastus medialis  obliquus muscle off of the bone and right away encountered a large  seroma around the fracture.  I obtained cultures immediately, and she  had been given Ancef prior to the cultures and just as she was brought  back to the operating room.  I also took cultures of the fracture site  and found that at the fracture site as I dissected down that this was an  obvious nonunion, and there was minimal callus formation.  I curetted  extensively the inside and outside of the bone and all around the  fracture site itself.  I then gave her a gram of vancomycin after  cultures were obtained of the soft  tissues and bone.  Using pulsatile  lavage, I irrigated the leg and the fracture site with 500 mL of  bacitracin solution followed by 3 liters of normal saline solution and  finally another 500 mL of bacitracin solution.  I then decided to place  antibiotic beads within the fracture site, and so cement was mixed with  vancomycin and tobramycin and placed on a Prolene suture.  Small beads  were then placed all around the inside of the fracture site and on the  outside of this as well.  I then reapproximated the sleeve using an  interrupted #1 PDS suture followed by 0 PDS in the fascial plane and 2-0  PDS in the subcutaneous tissue.  Staples were used to close the skin.  I  placed Xeroform and a well-padded sterile dressing.  The tourniquet was  let down, and the foot pinked up nicely, and there was a nice pulse.  I  then placed the knee in a knee immobilizer as well.   She was awakened, extubated, and taken to the recovery room in stable  condition.  Postoperatively, we will follow cultures and keep her on IV  antibiotics and then make a decision about more definitive fixation in  the coming days.           ______________________________  Lind Guest. Ninfa Linden, M.D.     CYB/MEDQ  D:  07/20/2006  T:  07/20/2006  Job:  [id number]

## 2010-10-14 NOTE — Discharge Summary (Signed)
NAMESOSHA, EMSLEY                           ACCOUNT NO.:  [id number]   MEDICAL RECORD NO.:  [id number]                   PATIENT TYPE:  INP   LOCATION:  [id number]                                 FACILITY:  [facility]   PHYSICIAN:  Anderson Malta, M.D.                 DATE OF BIRTH:  [birth year]   DATE OF ADMISSION:  10/20/2003  DATE OF DISCHARGE:  10/27/2003                                 DISCHARGE SUMMARY   DISCHARGE DIAGNOSES:  Right knee arthritis.   SECONDARY DIAGNOSES:  None.   OPERATIONS AND PROCEDURES:  Right total knee replacement Oct 20, 2003.   HOSPITAL COURSE:  [name] is a 77 year old patient with right knee  arthritis who presented for right total knee replacement Oct 20, 2003.  She  underwent this procedure without complications.  She tolerated the procedure  well without immediate complications.  She was transferred to the recovery  room.  Motor and sensory functions intact on postop day #1.  She was  mobilized with physical therapy and placed on the CPM machine.  She was  started on Coumadin for DVT prophylaxis.  She was transfused 1 units of  packed red blood cells for hemoglobin of 8.2 on Oct 22, 2003.  The patient  had an otherwise unremarkable recovery.  She was mobilizing well with  physical therapy and went home with home health care,  The patient's  incision was intact.   Discharge medications include previous medications plus Coumadin and  Percocet for pain.  She will follow up with me in one week for staple  removal.  Continue weightbearing as tolerated, CPM machine at home, as well  as home health care.                                                Anderson Malta, M.D.    GSD/MEDQ  D:  12/08/2003  T:  12/08/2003  Job:  [id number]

## 2010-10-14 NOTE — Discharge Summary (Signed)
NAMETRENNY, STENSETH                           ACCOUNT NO.:  [id number]   MEDICAL RECORD NO.:  [id number]                   PATIENT TYPE:  INP   LOCATION:  5703                                 FACILITY:  [facility]   PHYSICIAN:  Ardeen Jourdain, M.D.            DATE OF BIRTH:  03-Dec-1925   DATE OF ADMISSION:  08/12/2003  DATE OF DISCHARGE:  08/17/2003                                 DISCHARGE SUMMARY   DISCHARGE DIAGNOSES:  1. Pneumonia.  2. Diabetes.  3. Hypertension.  4. Anemia of chronic disease.  5. History of breast cancer.   DISCHARGE MEDICATIONS:  1. Celebrex 200 mg p.o. daily.  2. Metformin 500 mg p.o. b.i.d.  3. Glyburide 5 mg p.o. daily.  4. Elavil 25 mg q.h.s.  5. Actimol 35 mg q. week.  6. Levaquin 500 mg p.o. daily times seven days.  7. Cardizem LA 180 mg p.o. q.h.s.   CONSULTATIONS:  None.   PROCEDURES:  None.   FOLLOWUP:  Follow up with Dr. Tomasa Hosteller in 10 to 14 days.   HOSPITAL COURSE:  The patient was admitted on August 12, 2003 after failing  outpatient treatment for community-acquired pneumonia.  She was brought to  the primary care office and noted to be increasing short of breath, weak  with continued purulent production.  She was admitted for IV hydration and  antibiotics.  The patient was admitted and started on Zosyn and IV fluids.  Mucinex to loosen secretions.  During the course of hospitalization, her  white count did slowly resolve, her x-rays did improve with time.  She was  noted to be hypokalemic.  This was replaced.  Her blood pressure was noted  to be elevated and additional medications were added.  The patient was also  found to be anemic and this was felt to be anemia due to chronic disease.  She had guaiac-negative stools.  The patient was discharged in stable  condition, feeling much better, and able to once again eat a regular meal.  She is to follow up as directed.  She was discharged in stable condition.                        Ardeen Jourdain, M.D.    DEH/MEDQ  D:  09/08/2003  T:  09/09/2003  Job:  [id number]

## 2010-10-14 NOTE — Consult Note (Signed)
NAME:  [name], [name]                   ACCOUNT NO.:  o   MEDICAL RECORD NO.:  [id number]          PATIENT TYPE:  INP   LOCATION:  [id number]                         FACILITY:  [facility]   PHYSICIAN:  Dola Argyle, M.D.     DATE OF BIRTH:  [birth year]   DATE OF CONSULTATION:  10/27/2005  DATE OF DISCHARGE:                                   CONSULTATION   Ms. Rochefort is a very pleasant 77 year old lady.  She was admitted after  feeling acute shortness of breath.  She also has a jittery sensation and  feels very anxious.  She was admitted to be sure that she had not had a  pulmonary embolus or an acute MI. Since being here, she has been stable.  We  have an EKG from 2005, and at that time, her T-waves were upright.  Currently she has T-wave inversions.  She does have left ventricular  hypertrophy.  The EKGs have not evolved since being in the hospital.  Her  enzymes are negative.  [name] stable.   The patient tells me today that she had an episode several months ago where  she had this jittery feeling.  At that time, she did not have shortness of  breath.  She rested and improved and this resolved.   Patient does have risk factors for coronary disease including hypertension  and diabetes and of course her age.   ALLERGIES:  CODEINE.   MEDICATIONS ON ADMISSION:  1.  AcipHex 20.  2.  Micardis 40.  3.  Cardizem LA 240.  4.  Actonel.  5.  Metformin 1000 b.i.d.  6.  Celebrex 200 daily.  7.  Darvocet-N nightly.  8.  Iron sulfate.  9.  Actos 30.   OTHER MEDICAL PROBLEMS:  See the complete list below.   SOCIAL HISTORY:  Patient is widowed.  She has family living in town and I  spoke with her daughter during my interview with the patient recently.  She  does not smoke.   FAMILY HISTORY:  Family history is noncontributory related to this patient  at this time.   REVIEW OF SYSTEMS:  She has had no fever or chills.  She does have some  visual problems and uses reading glasses.  She  has had no major skin  problems.  The shortness of breath, as mentioned, occurred with admission.  With review once again, she has not had marked shortness of breath recently  prior to admission.  The patient has some numbness in her left arm  intermittently.  She had some nocturia.  She has arthralgias of her neck.  Otherwise, her review of systems is negative.   PHYSICAL EXAMINATION:  GENERAL APPEARANCE:  [name] oriented to  person, time and place and her affect is normal.  VITAL SIGNS:  Blood pressure 170/74 with respirations of 19, temperature  97.5.  HEENT:  No xanthelasma. She has normal extraocular motion.  There are no  carotid bruits.  There is no jugular venous distension.  CARDIOVASCULAR:  There is an S1 with an S2.  There are no clicks or  significant murmurs.  ABDOMEN:  Soft.  There are no masses or bruits.  There is no significant  peripheral edema.   EKG from 2005 revealed increased voltage but upright T-waves.  EKG this  admission showed some lateral T-wave inversions. The T-wave changes are new  since 2005 but there is no significant evolution of the T-waves here in the  hospital.   Cardiac enzymes are normal so far.  BNP is slightly elevated but not  particularly abnormal for her age at 74.  D-dimer is normal at 0.31 and TSH  is normal.  BUN is 18 with creatinine 1.  Hemoglobin 13.1.   A 2-D echo was done.  It shows an ejection fraction of 55 to 60%.  There is  mild left ventricular hypertrophy. There is some upper septal thickening but  there is no evidence of left ventricular outflow tract obstruction.   PROBLEMS:  1.  Hypertension.  2.  Diabetes.  3.  History of breast carcinoma, status post lumpectomy and tamoxifen in the      past.  4.  History of diverticulosis.  5.  Osteoporosis.  6.  Uterine cancer, status post hysterectomy.  7.  *Current episode of feeling jittery and shortness of breath suddenly.      There has been no evidence of an  myocardial infarction.  She has good      left ventricular function.  As mentioned, she had an episode a month or      two ago at which time she also felt jittery.  At that time, she      thought that her walking was a little unstable.  She then improved.      She does have abnormal EKGs.  However, she does have significant      hypertension.  At this point, I am not convinced that this episode was      an anginal equivalent.  I have carefully thought through the type of      testing that she needs.  The studies can be done as an outpatient.  We      will arrange for an adenosine Myoview scan and I will see her in my      office for follow-up.  I may consider an event recorder.  We will try to      follow her symptoms more carefully over time to get a better feel for      how to proceed with her work-up.  I spoke with the patient's daughter by      telephone when I was in the room with the patient.  She understands the      plan.           ______________________________  Dola Argyle, M.D.     JK/MEDQ  D:  10/27/2005  T:  10/27/2005  Job:  [id number]   cc:   Ardeen Jourdain, M.D.  Fax: [phone]

## 2010-10-14 NOTE — Consult Note (Signed)
[name], [name]               ACCOUNT NO.:  [id number]   MEDICAL RECORD NO.:  [id number]          PATIENT TYPE:  INP   LOCATION:  4731                         FACILITY:  [facility]   PHYSICIAN:  Mobolaji B. Maia Petties, M.D.DATE OF BIRTH:  09/05/1925   DATE OF CONSULTATION:  DATE OF DISCHARGE:                                 CONSULTATION   PRIMARY CARE PHYSICIAN:  [name], M.D.   REFERRING PHYSICIAN:  Dr. Ninfa [name].   REASON FOR CONSULTATION:  Postoperative medical management of diabetes  and hypertension.   HISTORY OF PRESENT ILLNESS:  [name] is a pleasant 77 year old  Caucasian female who unfortunately visited her late husband grave site  in the [place] this afternoon.  She accidentally tripped over while  walking and sustained open fracture involving periprosthetic left knee  distal femoral fracture.  She has been taken to the OR by Dr. Ninfa [name].  We have been requested to manage medical problems which include diabetes  mellitus and hypertension.  Most recently, the patient had partial  gastrectomy by Dr. Hassell Done in early September of 2007.  She was found to  have gastric adenocarcinoma along the greater curvature on upper  endoscopy.  There was metastases. The patient has been having early  morning nausea.   She denies any chest pain, dizziness or syncope preceding the fall.  The  patient has been in her usual state of health and has been relatively  stable.   REVIEW OF SYSTEMS:  She denies pain.  There is no shortness of breath,  chest pain, diarrhea, vomiting, fever or chills, cough or shortness of  breath.   PAST MEDICAL HISTORY:  1. Significant for hypertension.  2. Diabetes mellitus.  3. Diverticulosis.  4. Osteoporosis.  5. Osteoarthritis:  She is status post bilateral knee replacement.  6. Breast cancer:  She is status post lumpectomy, tamoxifen.   PAST SURGICAL HISTORY:  1. Right total knee replacement in May 2005 and left total knee  replacement in October 2005.  2. Hysterectomy secondary to uterine cancer.   CURRENT MEDICATIONS:  Prior to hospitalization include  1.Actonel 35 mg once a week.  1. Celebrex 200 mg daily.  2. Ferrous sulfate 325 mg one table two times a day.  3. Cardizem 240 mg daily.  4. Pioglitazone 30 mg daily.  5. Metformin 1000 mg twice a daily.  6. AcipHex 20 mg daily.  7. Centrum Silver once a day.  8. Micardis 80 mg daily.  9. Reglan 10 mg four times a day.  10.Coreg CR 20 mg daily.   MEDICATIONS IN HOSPITAL:  1. Cephazolin 1 gm IV q.8h.  2. Cardizem 240 mg daily.  3. Avapro 300 mg daily.  4. Actos 30 mg daily.  5. Tylenol p.r.n.  6. Robaxin p.r.n.  7. Morphine p.r.n.  8. Zofran p.r.n.  9. Metformin is on hold.   ALLERGIES:  CODEINE.   FAMILY HISTORY:  Noncontributory.   SOCIAL HISTORY:  [name] a widow.  She is a lifelong nonsmoker.  She does not drink alcohol.  One of her grandsons lives with her.   CURRENT  VITALS:  Temperature 96.8, pulse 71, respiratory rate 18, blood  pressure 142/63, O2 saturation of 97% on two liters.  On examination, the patient is alert, oriented in time, place and  person.  Normocephalic and atraumatic head.  Not in respiratory  distress.  Looks pale, anicteric.  No elevated JVD.  Mucus membrane dry.  Not clear clinically to auscultation.  CVS S1-S2.  A grade 2/6 systolic  murmur.  Abdomen is nondistended, soft, nontender.  Bowel sounds  present.  Extremities no pedal edema or calf tenderness.  Dorsalis pedis  pulses 2+ bilaterally.  CNS is nonfocal.   LABORATORY DATA:  Urinalysis unremarkable, specific gravity was 1.016.  Sodium 141, potassium 3.4, chloride 112, glucose 126, BUN 21, creatinine  1.4.  White cell 8.9, hemoglobin 11.3, hematocrit 33.4, MCV 86,  platelets 360.  Differential is normal.  Note a previous BUN and  creatinine on the February 06, 2006 was 6/1.1.  Chest x-ray shows  cardiomegaly with evidence of mild interstitial  edema.  Pelvic x-ray  shows no acute abnormality.  X-ray left knee showed distal femoral  fracture with periprostatic fracture.   EKG shows sinus arrhythmia with PVCs, although this was reported as  atrial fibrillation.  This EKG has a wavy baseline.  [name]  currently on telemetry and she is in sinus rythym at a rate of mid-60s.  Left ventricular hypertrophy.   ASSESSMENT/PLAN:  Ms. [name] is a 77 year old Caucasian female  presenting with open fracture of the distal femur.  We have been asked  to manage medical problems.   IMPRESSION:  1. Open fracture distal left femur.  She is status post surgery.  2. Diabetes mellitus.  3. Hypertension which is fairly controlled.  4. Acute renal insufficiency, likely prerenal.  5. History of gastric cancer, status post partial gastrectomy      September 2007.   PLAN:  We will continue with home medications.  We will continue hold  metformin for increased creatinine and check hemoglobin A1c.  We will  place on hyperglycemia protocol with NovoLog q.a.c. using moderate  hyperglycemia protocol and if creatinine improves we will resume  metformin and also titrate hyperglycemia protocol as deemed necessary.  Continue with IV fluids, although chest x-ray shows mild interstitial  edema.  The patient appears to be clinically dry.  We will check BNP.  Of note is that 2-D echocardiogram in May 2007 showed normal ejection  fraction of 60%.  Thank you for the consult.  We will follow.   CBG has not been checked yet.  We will request for this, although BMET  showed CPK of 126.  We will repeat EKG in the a.m.      Mobolaji B. Maia Petties, M.D.  Electronically Signed     MBB/MEDQ  D:  04/09/2006  T:  04/09/2006  Job:  [id number]   cc:   [name]. [name], M.D.  [name], M.D.

## 2010-10-14 NOTE — Op Note (Signed)
[name], [name]               ACCOUNT NO.:  [id number]   MEDICAL RECORD NO.:  [id number]          PATIENT TYPE:  INP   LOCATION:  4731                         FACILITY:  [facility]   PHYSICIAN:  Lind Guest. Ninfa Linden, M.D.DATE OF BIRTH:  10/28/1925   DATE OF PROCEDURE:  04/08/2006  DATE OF DISCHARGE:                                 OPERATIVE REPORT   PREOPERATIVE DIAGNOSIS:  Left left open distal femur periarticular fracture  (around total knee replacement).   POSTOPERATIVE DIAGNOSIS:  Left left open distal femur periarticular fracture  (around total knee replacement).   PROCEDURES:  1. Irrigation and debridement of left open distal femur fracture.  2. Open reduction and internal fixation of left open periarticular distal      femur fracture using a [employer] Nephew 8-hole periarticular locking      plate.   SURGEON:  Lind Guest. Ninfa Linden, M.D.   ANESTHESIA:  General.   ANTIBIOTICS:  1 g IV Ancef.   BLOOD LOSS:  150 mL.   TOURNIQUET TIME:  1 hour 45 minutes.   COMPLICATIONS:  None.   INDICATIONS:  Briefly, [name] is a very pleasant 77 year old female with  a history of bilateral total knee replacement.  She was ambulating with  family at a cemetery today when she sustained a mechanical fall.  She did  not have a syncopal episode and says she accidentally lost her footing.  She  sustained a periarticular distal femur fracture around her left total knee  replacement.  This was an open fracture, and she was taken by EMS to the  [facility] emergency department.  Assessment was made, and she was found to  have a least 2 x 2 cm open wound on the anterior lateral thigh with  protruding exposed bone.  It was recommend that she undergo open reduction  and internal fixation with irrigation and debridement due to the open nature  of the fracture as well as plating of the fracture due to its comminution  and her likeliness of soft bone.  The risks and benefits of this were  explained to her and her family.  They agreed to proceed with surgery.  She  was given 900 mg of IV clindamycin in the emergency room.   PROCEDURE DESCRIPTION:  After informed consent was obtained, the appropriate  left leg was marked.  She was brought to the operating room and placed  supine on the plate flat table.  General anesthesia was obtained.  Her leg  was prepped and draped with Betadine scrub and paint.  A sterile stockinette  was used as well as sterile drapes and a sterile tourniquet.  The leg was  left in the tourniquet that was inflated to 350 mm pressure.  The open wound  was then extended proximally and distally so I could expose the bone ends.  There was minimal contamination but I did curette the bone ends and then  irrigated the tissues using pulsatile lavage with first 500 mL of bacitracin  solution, followed by 3 L of normal saline solution, again using pulsatile  lavage.  Due to  the distal nature of the fracture, I did have to open the  joint and expose the distal femoral component of the total knee.  This was  not found to be loose, but the fracture lines did extend laterally down to  the distal aspect of the femur.  Again, there was significant comminution  and the bone felt to be soft.  I was able to obtain close to anatomic  reduction visually and with holding the fracture in place I then fashioned  an 8-hole Smith [employer] Nephew periarticular locking plate along the lateral  cortex of the femur.  I then secured this proximally with 4 bicortical  screws 3 of these being locking screws.  I then secured it distally with at  least 6 locking screws as well to have an anatomical reduction.  This was  all verified under direct fluoroscopic guidance.  I then copiously irrigated  the wound again using pulsatile lavage and an additional 3 L of normal  saline solution.  I then closed the joint capsule with interrupted #1 Vicryl  suture, followed by 0 Vicryl loosely in the muscle  that was split.  I did  not close the IT band because of the swollen nature of this but was easily  able to mobilize the soft tissues and close the subcutaneous tissue with 2-0  interrupted Vicryl and then staples on the skin.  The tourniquet was let  down and Xeroform was placed over the wound, followed by a well-padded  sterile dressing and an Ace wrap.  The knee was then put in a knee  immobilizer.  The total tourniquet time was 1 hour 45 minutes, the total  blood loss was 150 mL, and final counts were all correct and there were no  complications noted.  She was awakened, extubated, taken to the recovery  room in stable condition.           ______________________________  Lind Guest. Ninfa Linden, M.D.     CYB/MEDQ  D:  04/08/2006  T:  04/09/2006  Job:  [id number]

## 2010-10-14 NOTE — Discharge Summary (Signed)
NAMECHEZ, GRAUER               ACCOUNT NO.:  [id number]   MEDICAL RECORD NO.:  [id number]          PATIENT TYPE:  INP   LOCATION:  5035                         FACILITY:  [facility]   PHYSICIAN:  Lind Guest. Ninfa Linden, M.D.DATE OF BIRTH:  04-23-1926   DATE OF ADMISSION:  07/20/2006  DATE OF DISCHARGE:  07/27/2006                               DISCHARGE SUMMARY   ADMITTING DIAGNOSIS:  Left distal femur periprosthetic fracture  nonunion.   DISCHARGE DIAGNOSIS:  Left distal femur periprosthetic fracture  nonunion.   SECONDARY DIAGNOSES:  1. History of chronic anemia.  2. Hypertension.  3. Diabetes.  4. Osteoporosis.  5. Status post left knee replacement.   PROCEDURES:  1. Irrigation and debridement with antibiotic bead placement on      July 20, 2006.  2. Irrigation and debridement of left leg with definitive fixation and      plating on July 24, 2006.   HOSPITAL COURSE:  Ms. Kaschak is a 77 year old female who back in  November sustained a periprosthetic left distal femur fracture just  proximal to a knee prosthesis.  This is an open fracture from a fall in  a [place].  She underwent open reduction, internal fixation of this  fracture.  At three months' time it was felt that she has developed a  nonunion.  She came into the office with a varus deformity.  I  recommended further operative intervention.  She was admitted to the  hospital and taken to the operating room on July 20, 2006, where she  underwent an assessment of the fracture site.  There was a large seroma  noted and no healing of the bone was encountered.  Cultures were  obtained from the knee joint as well as of the fracture and antibiotic  beads were placed in the wound.  For detailed description of the  operation, please refer to the dictated operative note in the patient's  medical record.  She was admitted for further IV antibiotics and for  assessment of cultures.  After three to four days, the  cultures were  final and none of these grew out anything.  Her infectious parameter  indices came back to normal.  She did require a transfusion in the  interim due to acute blood loss anemia on top of chronic anemia.  She  was taken back to the operating room on July 24, 2006, where she  underwent removal of the antibiotic beads and thorough irrigation and  debridement.  She underwent bone grafting with allograft bone graft at  the fracture site and then plating with a medial plate.  For detailed  description of this operation, please refer to the dictated operative  noted in the patient's medical record.   Postoperatively, I had her in a Bledsoe knee brace with touchdown  weightbearing on this leg.  She did require one additional transfusion  prior to discharge.  By the day of discharge, she was tolerating a  regular diet as well as oral pain medications.  She was having a bowel  movement.  A PICC line had been placed as well.  It was felt that she  could be discharged home safely with Home Health to follow up and [facility] working with her vancomycin.   DISPOSITION:  Home.   DISCHARGE INSTRUCTIONS:  While she is at home, she will receive  vancomycin per pharmacy protocol following their peaks and troughs for a  total of three weeks.  She will remain in the [facility] knee brace.  She  can come out of this for rest and for shower but she should limit any  type of knee motion.  She will remain non weightbearing on that knee  with work and with therapy.   DISCHARGE MEDICATIONS:  1. Vancomycin.  2. Percocet.  3. She will continue all of her same home medications as before which      can be seen in her medical administration record sheet.   Followup in the office will be established in two weeks.           ______________________________  Lind Guest. Ninfa Linden, M.D.     CYB/MEDQ  D:  07/27/2006  T:  07/28/2006  Job:  [id number]

## 2010-10-14 NOTE — Discharge Summary (Signed)
[name], [name]               ACCOUNT NO.:  [id number]   MEDICAL RECORD NO.:  [id number]          PATIENT TYPE:  INP   LOCATION:  [id number]                         FACILITY:  [facility]   PHYSICIAN:  Anderson Malta, M.D.    DATE OF BIRTH:  [birth year]   DATE OF ADMISSION:  03/01/2004  DATE OF DISCHARGE:  03/05/2004                                 DISCHARGE SUMMARY   DISCHARGE DIAGNOSES:  Left knee arthritis.   SECONDARY DIAGNOSES:  None.   OPERATION/PROCEDURE:  Left total knee replacement performed March 01, 2004.   HOSPITAL COURSE:  [name] is a 77 year old female with bilateral knee  arthritis.  She underwent a left total knee replacement March 01, 2004.  She tolerated procedure well without immediate complications.  Postoperative  day one left foot was noted to be perfuse, sensate, mobile.  Her starting  hematocrit was 30.  Hematocrit postoperative day #1 was 21.6.  She was  transfused 2 units of packed red blood cells at that time.  She was started  on CPM for knee range of motion and physical therapy for mobilization.  Patient's hematocrit bounced back to 27 on postoperative day #2.  INR was  1.5 at that time.  Incision was intact on postoperative day #3.  CPM was  increased.  She made apparently quick recovery and was safe to walk around  in the room by postoperative day #4.  She was discharged at that time.  She  will follow up with me in a week.   DISCHARGE MEDICATIONS:  1.  Admission medications.  2.  Coumadin for DVT prophylaxis.  3.  Percocet for pain.  4.  Robaxin for muscle spasm.   Home health OT and PT will follow the patient at home.       GSD/MEDQ  D:  03/31/2004  T:  03/31/2004  Job:  [id number]

## 2011-12-05 ENCOUNTER — Ambulatory Visit
Admission: RE | Admit: 2011-12-05 | Discharge: 2011-12-05 | Disposition: A | Discharge status: Home / Self Care | Payer: Medicare Other | Source: Ambulatory Visit | Attending: Internal Medicine | Admitting: Internal Medicine

## 2011-12-05 ENCOUNTER — Other Ambulatory Visit: Payer: Self-pay | Admitting: Internal Medicine

## 2011-12-05 DIAGNOSIS — R109 Unspecified abdominal pain: Secondary | ICD-10-CM

## 2011-12-05 DIAGNOSIS — M25512 Pain in left shoulder: Secondary | ICD-10-CM

## 2012-03-07 ENCOUNTER — Emergency Department (HOSPITAL_COMMUNITY): Payer: Medicare Other

## 2012-03-07 ENCOUNTER — Encounter (HOSPITAL_COMMUNITY): Payer: Self-pay | Admitting: *Deleted

## 2012-03-07 ENCOUNTER — Emergency Department (HOSPITAL_COMMUNITY)
Admission: EM | Admit: 2012-03-07 | Discharge: 2012-03-07 | Disposition: A | Discharge status: Home / Self Care | Payer: Medicare Other | Attending: Emergency Medicine | Admitting: Emergency Medicine

## 2012-03-07 DIAGNOSIS — I10 Essential (primary) hypertension: Secondary | ICD-10-CM | POA: Insufficient documentation

## 2012-03-07 DIAGNOSIS — M25519 Pain in unspecified shoulder: Secondary | ICD-10-CM | POA: Insufficient documentation

## 2012-03-07 DIAGNOSIS — S52539A Colles' fracture of unspecified radius, initial encounter for closed fracture: Secondary | ICD-10-CM | POA: Insufficient documentation

## 2012-03-07 DIAGNOSIS — E119 Type 2 diabetes mellitus without complications: Secondary | ICD-10-CM | POA: Insufficient documentation

## 2012-03-07 DIAGNOSIS — M25539 Pain in unspecified wrist: Secondary | ICD-10-CM | POA: Insufficient documentation

## 2012-03-07 DIAGNOSIS — S52502A Unspecified fracture of the lower end of left radius, initial encounter for closed fracture: Secondary | ICD-10-CM

## 2012-03-07 DIAGNOSIS — S42209A Unspecified fracture of upper end of unspecified humerus, initial encounter for closed fracture: Secondary | ICD-10-CM

## 2012-03-07 DIAGNOSIS — S42293A Other displaced fracture of upper end of unspecified humerus, initial encounter for closed fracture: Secondary | ICD-10-CM | POA: Insufficient documentation

## 2012-03-07 DIAGNOSIS — W19XXXA Unspecified fall, initial encounter: Secondary | ICD-10-CM

## 2012-03-07 DIAGNOSIS — W010XXA Fall on same level from slipping, tripping and stumbling without subsequent striking against object, initial encounter: Secondary | ICD-10-CM | POA: Insufficient documentation

## 2012-03-07 HISTORY — DX: Malignant (primary) neoplasm, unspecified: C80.1

## 2012-03-07 HISTORY — DX: Type 2 diabetes mellitus without complications: E11.9

## 2012-03-07 MED ORDER — TRAMADOL HCL 50 MG PO TABS: 50.0000 mg | ORAL_TABLET | Freq: Four times a day (QID) | ORAL | Status: DC | PRN

## 2012-03-07 MED ORDER — TRAMADOL HCL 50 MG PO TABS
50.0000 mg | ORAL_TABLET | Freq: Once | ORAL | Status: AC
  Administered 2012-03-07: 50 mg via ORAL
  Filled 2012-03-07: qty 1

## 2012-03-07 NOTE — ED Notes (Signed)
Pt's daughter reports pt fell, missed a step.  Landed on her L shoulder.  Pt reports pain radiates from her L shoulder all the way down to there L wrist.  Pt is guarding her L arm.

## 2012-03-07 NOTE — ED Notes (Signed)
[employer] called for splint application.

## 2012-03-07 NOTE — ED Provider Notes (Signed)
History     CSN: [id number]  Arrival date & time 03/07/12  1107   First MD Initiated Contact with Patient 03/07/12 1252      Chief Complaint  Patient presents with  . Fall  . Shoulder Pain    (Consider location/radiation/quality/duration/timing/severity/associated sxs/prior treatment) Patient is a 78 y.o. female presenting with fall and shoulder pain. The history is provided by the patient.  Fall Pertinent negatives include no numbness and no abdominal pain.  Shoulder Pain Pertinent negatives include no chest pain, no abdominal pain and no shortness of breath.   patient tripped and fell. Pain in left shoulder and left wrist. She denies hitting her head or neck. No chest or abdominal pain. No numbness or weakness. She's normally ambulatory. She does use a cane as needed, however she does not use it at home. She does have an orthopedic surgeon. No lightheadedness or dizziness.  Past Medical History  Diagnosis Date  . Diabetes mellitus without complication   . Hypertension   . Cancer     breast cancer    Past Surgical History  Procedure Date  . Abdominal hysterectomy   . Joint replacement     knees  . Fracture surgery     L femur  . Breast lumpectomy     No family history on file.  History  Substance Use Topics  . Smoking status: Never Smoker   . Smokeless tobacco: Not on file  . Alcohol Use: No    OB History    Grav Para Term Preterm Abortions TAB SAB Ect Mult Living                  Review of Systems  Constitutional: Negative for chills.  Respiratory: Negative for shortness of breath.   Cardiovascular: Negative for chest pain.  Gastrointestinal: Negative for abdominal pain.  Genitourinary: Negative for flank pain.  Musculoskeletal: Negative for back pain.       Left shoulder and left wrist pain.  Neurological: Negative for weakness and numbness.    Allergies  Codeine and Percocet  Home Medications   Current Outpatient Rx  Name Route Sig  Dispense Refill  . CALCIUM CARBONATE 600 MG PO TABS Oral Take 600 mg by mouth 2 (two) times daily with a meal.    . CARVEDILOL 12.5 MG PO TABS Oral Take 12.5 mg by mouth 2 (two) times daily with a meal.    . VITAMIN D 1000 UNITS PO TABS Oral Take 2,000 Units by mouth daily.    [name] FERROUS SULFATE 325 (65 FE) MG PO TABS Oral Take 325 mg by mouth daily with breakfast.    . LUTEIN PO Oral Take 1 capsule by mouth daily.    [name] MAGNESIUM-ZINC PO Oral Take 1 tablet by mouth daily.    . ADULT MULTIVITAMIN W/MINERALS CH Oral Take 1 tablet by mouth daily.    [name] PIOGLITAZONE HCL 30 MG PO TABS Oral Take 30 mg by mouth daily.    [name] SIMVASTATIN 20 MG PO TABS Oral Take 20 mg by mouth every evening.    [name] TELMISARTAN 40 MG PO TABS Oral Take 40 mg by mouth daily.    . TRAMADOL HCL 50 MG PO TABS Oral Take 1 tablet (50 mg total) by mouth every 6 (six) hours as needed for pain. 15 tablet 0    BP 116/57  Pulse 56  Temp 98.1 F (36.7 C) (Oral)  Resp 18  SpO2 95%  Physical Exam  Constitutional: She appears well-developed.  HENT:  Head: Normocephalic.  Eyes: Pupils are equal, round, and reactive to light.  Cardiovascular: Normal rate and regular rhythm.   Pulmonary/Chest: Effort normal.  Abdominal: Soft. There is no tenderness.  Musculoskeletal:       Patient with left shoulder tenderness. Decreased range of motion. Neurovascularly intact in hand. Tenderness over wrist. Decreased flexion-extension of wrist due to pain. Skin is intact.good capilary refil distally.  Skin: Skin is warm.    ED Course  Procedures (including critical care time)  Labs Reviewed - No data to display Dg Wrist Complete Left  03/07/2012  *RADIOLOGY REPORT*  Clinical Data: Fall, left wrist pain/swelling  LEFT WRIST - COMPLETE 3+ VIEW  Comparison: None.  Findings: Comminuted distal radial fracture with a transverse component as well as a longitudinal component extending to the articular surface.  No additional fracture is seen.   Associated soft tissue swelling  IMPRESSION: Comminuted distal radial fracture with intra-articular extension.   Original Report Authenticated By: Julian Hy, M.D.    Dg Shoulder Left  03/07/2012  *RADIOLOGY REPORT*  Clinical Data: Fall, left shoulder pain  LEFT SHOULDER - 2+ VIEW  Comparison: None.  Findings: Comminuted fracture involving the left humeral head/neck with at least four discrete fracture fragments.  Multiple left rib deformities, likely old.  IMPRESSION: Comminuted fracture involving the left humeral head/neck.   Original Report Authenticated By: Julian Hy, M.D.      1. Fall   2. Proximal humerus fracture   3. Closed fracture of left distal radius       MDM  Patient with fall. Proximal left humerus fracture and left radial intra-articular fracture. Patient has baseline ambulatory. She would like to go home. No other apparent injuries. After discussion with Dr. Marlou Sa he will see her in the office tomorrow.        Jasper Riling. Alvino Chapel, MD 03/07/12 [phone]

## 2012-04-22 ENCOUNTER — Other Ambulatory Visit: Payer: Self-pay | Admitting: Nephrology

## 2012-04-22 DIAGNOSIS — I10 Essential (primary) hypertension: Secondary | ICD-10-CM

## 2012-04-24 ENCOUNTER — Other Ambulatory Visit: Payer: Medicare Other

## 2012-04-29 ENCOUNTER — Ambulatory Visit
Admission: RE | Admit: 2012-04-29 | Discharge: 2012-04-29 | Disposition: A | Discharge status: Home / Self Care | Payer: Medicare Other | Source: Ambulatory Visit | Attending: Nephrology | Admitting: Nephrology

## 2012-04-29 DIAGNOSIS — I10 Essential (primary) hypertension: Secondary | ICD-10-CM

## 2013-01-28 ENCOUNTER — Other Ambulatory Visit: Payer: Self-pay | Admitting: Nurse Practitioner

## 2013-01-28 ENCOUNTER — Ambulatory Visit
Admission: RE | Admit: 2013-01-28 | Discharge: 2013-01-28 | Disposition: A | Discharge status: Home / Self Care | Payer: Medicare Other | Source: Ambulatory Visit | Attending: Nurse Practitioner | Admitting: Nurse Practitioner

## 2013-01-28 DIAGNOSIS — R0989 Other specified symptoms and signs involving the circulatory and respiratory systems: Secondary | ICD-10-CM

## 2013-04-01 ENCOUNTER — Ambulatory Visit (INDEPENDENT_AMBULATORY_CARE_PROVIDER_SITE_OTHER): Payer: Medicare Other

## 2013-04-01 VITALS — BP 162/69 | HR 76 | Resp 12

## 2013-04-01 DIAGNOSIS — E1140 Type 2 diabetes mellitus with diabetic neuropathy, unspecified: Secondary | ICD-10-CM

## 2013-04-01 DIAGNOSIS — B351 Tinea unguium: Secondary | ICD-10-CM

## 2013-04-01 DIAGNOSIS — Q828 Other specified congenital malformations of skin: Secondary | ICD-10-CM

## 2013-04-01 DIAGNOSIS — E1149 Type 2 diabetes mellitus with other diabetic neurological complication: Secondary | ICD-10-CM

## 2013-04-01 DIAGNOSIS — M79609 Pain in unspecified limb: Secondary | ICD-10-CM

## 2013-04-01 DIAGNOSIS — E1142 Type 2 diabetes mellitus with diabetic polyneuropathy: Secondary | ICD-10-CM

## 2013-04-01 NOTE — Patient Instructions (Signed)

## 2013-04-01 NOTE — Progress Notes (Signed)
  Subjective:    Patient ID: [name], female    DOB: Apr 23, 1926, 79 y.o.   MRN: [id number]  HPI Comments: '' TOENAILS TRIM''  no changes medication her health. Patient is having some burning or cramping in her feet especially right foot at times associated with her diabetes and neuropathy.    Review of Systems  Constitutional: Negative.   HENT: Negative.   Eyes: Positive for visual disturbance.  Cardiovascular: Positive for leg swelling.  Gastrointestinal: Negative.   Endocrine: Negative.   Genitourinary: Negative.   Musculoskeletal: Negative.   Skin: Negative.   Allergic/Immunologic: Negative.   Neurological: Negative.   Hematological: Negative.   Psychiatric/Behavioral: Negative.        Objective:   Physical Exam Vascular status unchanged intact dorsalis pedis pulse one over 4 bilateral absent PT pulse bilateral. Capillary fill time 3 seconds all digits. No edema rubor noted mild varicosities noted bilateral. Epicritic and proprioceptive sensations diminished on Semmes Weinstein testing to both feet there is normal plantar response DTRs not listed neurologic skin color pigment normal hair growth absent bilateral nails extremely friable criptotic toes incurvation one through 5 bilateral painful tender on palpation and attempted debridement at this time. Patient also pinch callus in the hallux IP joint at MTP joints bilateral which are debrided.       Assessment & Plan:  Diabetes with peripheral neuropathy., Onychomycosis glucose in nails 1 through 5 bilateral. At this time nails are debrided and the presence of diabetes cocking factors as well as pain symptomology. Also this time debridement multiple keratoses both hallux IP joint and MTP joints. Return for followup and palliative care in 3 months. Maintain a coming shoe year as instructed also recommended lotion to her feet daily  Harriet Masson DPM

## 2013-07-01 ENCOUNTER — Ambulatory Visit (INDEPENDENT_AMBULATORY_CARE_PROVIDER_SITE_OTHER): Payer: Medicare Other

## 2013-07-01 VITALS — BP 136/80 | HR 78 | Resp 16

## 2013-07-01 DIAGNOSIS — Q828 Other specified congenital malformations of skin: Secondary | ICD-10-CM

## 2013-07-01 DIAGNOSIS — B351 Tinea unguium: Secondary | ICD-10-CM

## 2013-07-01 DIAGNOSIS — E1149 Type 2 diabetes mellitus with other diabetic neurological complication: Secondary | ICD-10-CM

## 2013-07-01 DIAGNOSIS — M79609 Pain in unspecified limb: Secondary | ICD-10-CM

## 2013-07-01 DIAGNOSIS — E1142 Type 2 diabetes mellitus with diabetic polyneuropathy: Secondary | ICD-10-CM

## 2013-07-01 DIAGNOSIS — E1140 Type 2 diabetes mellitus with diabetic neuropathy, unspecified: Secondary | ICD-10-CM

## 2013-07-01 NOTE — Patient Instructions (Signed)
Diabetes and Foot Care Diabetes may cause you to have problems because of poor blood supply (circulation) to your feet and legs. This may cause the skin on your feet to become thinner, break easier, and heal more slowly. Your skin may become dry, and the skin may peel and crack. You may also have nerve damage in your legs and feet causing decreased feeling in them. You may not notice minor injuries to your feet that could lead to infections or more serious problems. Taking care of your feet is one of the most important things you can do for yourself.  HOME CARE INSTRUCTIONS  Wear shoes at all times, even in the house. Do not go barefoot. Bare feet are easily injured.  Check your feet daily for blisters, cuts, and redness. If you cannot see the bottom of your feet, use a mirror or ask someone for help.  Wash your feet with warm water (do not use hot water) and mild soap. Then pat your feet and the areas between your toes until they are completely dry. Do not soak your feet as this can dry your skin.  Apply a moisturizing lotion or petroleum jelly (that does not contain alcohol and is unscented) to the skin on your feet and to dry, brittle toenails. Do not apply lotion between your toes.  Trim your toenails straight across. Do not dig under them or around the cuticle. File the edges of your nails with an emery board or nail file.  Do not cut corns or calluses or try to remove them with medicine.  Wear clean socks or stockings every day. Make sure they are not too tight. Do not wear knee-high stockings since they may decrease blood flow to your legs.  Wear shoes that fit properly and have enough cushioning. To break in new shoes, wear them for just a few hours a day. This prevents you from injuring your feet. Always look in your shoes before you put them on to be sure there are no objects inside.  Do not cross your legs. This may decrease the blood flow to your feet.  If you find a minor scrape,  cut, or break in the skin on your feet, keep it and the skin around it clean and dry. These areas may be cleansed with mild soap and water. Do not cleanse the area with peroxide, alcohol, or iodine.  When you remove an adhesive bandage, be sure not to damage the skin around it.  If you have a wound, look at it several times a day to make sure it is healing.  Do not use heating pads or hot water bottles. They may burn your skin. If you have lost feeling in your feet or legs, you may not know it is happening until it is too late.  Make sure your health care provider performs a complete foot exam at least annually or more often if you have foot problems. Report any cuts, sores, or bruises to your health care provider immediately. SEEK MEDICAL CARE IF:   You have an injury that is not healing.  You have cuts or breaks in the skin.  You have an ingrown nail.  You notice redness on your legs or feet.  You feel burning or tingling in your legs or feet.  You have pain or cramps in your legs and feet.  Your legs or feet are numb.  Your feet always feel cold. SEEK IMMEDIATE MEDICAL CARE IF:   There is increasing redness,   swelling, or pain in or around a wound.  There is a red line that goes up your leg.  Pus is coming from a wound.  You develop a fever or as directed by your health care provider.  You notice a bad smell coming from an ulcer or wound. Document Released: 05/12/2000 Document Revised: 01/15/2013 Document Reviewed: 10/22/2012 [employer] Patient Information 2014 [employer].  

## 2013-07-01 NOTE — Progress Notes (Signed)
   Subjective:    Patient ID: [name], female    DOB: 08-25-1925, 80 y.o.   MRN: [id number]  HPI Comments: "Trim my toenails and the calluses"     Review of Systems no new changes or findings noted     Objective:   Physical Exam Vascular status is intact with pedal pulses palpable DP plus one over 4 bilateral PT pulse thready absent bilateral Refill time 3 seconds all digits neurologically epicritic and proprioceptive sensations diminished on Semmes Weinstein testing. There is normal plantar response DTRs not elicited skin color pigment and hair growth absent nails thick brittle criptotic incurvated 1 through 5 bilateral with discoloration tenderness on palpation and debridement at this time. Also is callus and pinch callus of the first digit MTP and IP joints bilateral. No secondary infections no other changes or new findings noted.       Assessment & Plan:  Assessment this time diabetes with history peripheral neuropathy. Also onychomycosis criptotic incurvation of nails 1 through 5 bilateral painful tender and symptomatic debrided at this time. Debridement of multiple keratoses pinch callus the hallux IP joint first bilateral. Maintain a coming shoe gear recheck in 3 months for continued palliative care in the future as needed

## 2013-09-26 ENCOUNTER — Ambulatory Visit (INDEPENDENT_AMBULATORY_CARE_PROVIDER_SITE_OTHER): Payer: Medicare Other

## 2013-09-26 VITALS — BP 153/86 | HR 70 | Resp 12

## 2013-09-26 DIAGNOSIS — B351 Tinea unguium: Secondary | ICD-10-CM

## 2013-09-26 DIAGNOSIS — M79609 Pain in unspecified limb: Secondary | ICD-10-CM

## 2013-09-26 DIAGNOSIS — E1149 Type 2 diabetes mellitus with other diabetic neurological complication: Secondary | ICD-10-CM

## 2013-09-26 DIAGNOSIS — E1142 Type 2 diabetes mellitus with diabetic polyneuropathy: Secondary | ICD-10-CM

## 2013-09-26 DIAGNOSIS — Q828 Other specified congenital malformations of skin: Secondary | ICD-10-CM

## 2013-09-26 DIAGNOSIS — E1140 Type 2 diabetes mellitus with diabetic neuropathy, unspecified: Secondary | ICD-10-CM

## 2013-09-26 NOTE — Progress Notes (Signed)
   Subjective:    Patient ID: [name], female    DOB: 1926/03/17, 80 y.o.   MRN: [id number]  HPI toenails trim.    Review of Systems no new systemic changes or findings     Objective:   Physical Exam Masker status is intact pedal pulses palpable DP plus one over 4 bilateral PT thready one over 4 bilateral absent on the left refill time 3 seconds all digits and capillary refill epicritic sensations diminished on Semmes Weinstein testing to the digits and forefoot and arch area. There is pinch callus of the hallux and MTP joint bilateral with hemorrhage a keratoses no open wounds or ulcers noted DTRs not listed there is normal plantar response noted there is absent hair growth diminished skin texture turgor nails thick criptotic incurvated brittle and crumbly 1 through 5 bilateral no other new changes no open wounds no secondary infections.       Assessment & Plan:  Assessment this time his diabetes with peripheral neuropathy multiple keratoses debrided pinch callus the hallux IP joint and MTP joints. Also debridement multiple dystrophic probably orthotic nails brittle discolored and tender and painful 1 through 5 bilateral with brittleness and friability consistent with onychomycosis nails debrided x10 multiple keratoses debrided return in 3 months for long-term followup  Harriet Masson DPM

## 2013-09-26 NOTE — Patient Instructions (Signed)
Diabetes and Foot Care Diabetes may cause you to have problems because of poor blood supply (circulation) to your feet and legs. This may cause the skin on your feet to become thinner, break easier, and heal more slowly. Your skin may become dry, and the skin may peel and crack. You may also have nerve damage in your legs and feet causing decreased feeling in them. You may not notice minor injuries to your feet that could lead to infections or more serious problems. Taking care of your feet is one of the most important things you can do for yourself.  HOME CARE INSTRUCTIONS  Wear shoes at all times, even in the house. Do not go barefoot. Bare feet are easily injured.  Check your feet daily for blisters, cuts, and redness. If you cannot see the bottom of your feet, use a mirror or ask someone for help.  Wash your feet with warm water (do not use hot water) and mild soap. Then pat your feet and the areas between your toes until they are completely dry. Do not soak your feet as this can dry your skin.  Apply a moisturizing lotion or petroleum jelly (that does not contain alcohol and is unscented) to the skin on your feet and to dry, brittle toenails. Do not apply lotion between your toes.  Trim your toenails straight across. Do not dig under them or around the cuticle. File the edges of your nails with an emery board or nail file.  Do not cut corns or calluses or try to remove them with medicine.  Wear clean socks or stockings every day. Make sure they are not too tight. Do not wear knee-high stockings since they may decrease blood flow to your legs.  Wear shoes that fit properly and have enough cushioning. To break in new shoes, wear them for just a few hours a day. This prevents you from injuring your feet. Always look in your shoes before you put them on to be sure there are no objects inside.  Do not cross your legs. This may decrease the blood flow to your feet.  If you find a minor scrape,  cut, or break in the skin on your feet, keep it and the skin around it clean and dry. These areas may be cleansed with mild soap and water. Do not cleanse the area with peroxide, alcohol, or iodine.  When you remove an adhesive bandage, be sure not to damage the skin around it.  If you have a wound, look at it several times a day to make sure it is healing.  Do not use heating pads or hot water bottles. They may burn your skin. If you have lost feeling in your feet or legs, you may not know it is happening until it is too late.  Make sure your health care provider performs a complete foot exam at least annually or more often if you have foot problems. Report any cuts, sores, or bruises to your health care provider immediately. SEEK MEDICAL CARE IF:   You have an injury that is not healing.  You have cuts or breaks in the skin.  You have an ingrown nail.  You notice redness on your legs or feet.  You feel burning or tingling in your legs or feet.  You have pain or cramps in your legs and feet.  Your legs or feet are numb.  Your feet always feel cold. SEEK IMMEDIATE MEDICAL CARE IF:   There is increasing redness,   swelling, or pain in or around a wound.  There is a red line that goes up your leg.  Pus is coming from a wound.  You develop a fever or as directed by your health care provider.  You notice a bad smell coming from an ulcer or wound. Document Released: 05/12/2000 Document Revised: 01/15/2013 Document Reviewed: 10/22/2012 [employer] Patient Information 2014 [employer].  

## 2014-01-02 ENCOUNTER — Ambulatory Visit: Payer: Medicare Other

## 2014-02-03 ENCOUNTER — Ambulatory Visit (INDEPENDENT_AMBULATORY_CARE_PROVIDER_SITE_OTHER): Payer: Medicare Other

## 2014-02-03 DIAGNOSIS — M79609 Pain in unspecified limb: Secondary | ICD-10-CM

## 2014-02-03 DIAGNOSIS — E1149 Type 2 diabetes mellitus with other diabetic neurological complication: Secondary | ICD-10-CM

## 2014-02-03 DIAGNOSIS — E1142 Type 2 diabetes mellitus with diabetic polyneuropathy: Secondary | ICD-10-CM

## 2014-02-03 DIAGNOSIS — B351 Tinea unguium: Secondary | ICD-10-CM

## 2014-02-03 DIAGNOSIS — E1140 Type 2 diabetes mellitus with diabetic neuropathy, unspecified: Secondary | ICD-10-CM

## 2014-02-03 DIAGNOSIS — Q828 Other specified congenital malformations of skin: Secondary | ICD-10-CM

## 2014-02-03 DIAGNOSIS — M79676 Pain in unspecified toe(s): Secondary | ICD-10-CM

## 2014-02-03 NOTE — Progress Notes (Signed)
   Subjective:    Patient ID: [name], female    DOB: [birth year], 80 y.o.   MRN: [id number]  HPI Comments: Pt requests trimming of 10 toenails and callouses B/L 1st toes and MPJs.      Review of Systems no new findings or systemic changes noted     Objective:   Physical Exam Lower extremity objective findings reveal pedal pulses palpable DP plus one over 4 bilateral PT nonpalpable thready one over 4S bilateral absent on the left. Capillary refill time 3 seconds to 4 seconds all digits. There is no open wounds or ulcers at this time however multiple keratoses pinch callus both hallux with hemorrhage changes at the MTP and IP joints bilateral. Nails thick brittle crumbly friable dystrophic with tenderness and discoloration and friability. No open wounds no ulcers no secondary infections patient does have a digital contractures hammertoe deformities as well as mild HAV deformity      Assessment & Plan:  Assessment this time his diabetes with history peripheral neuropathy and angiopathy. This time painful mycotic nails 1 through 5 bilateral debrided and brittleness discoloration for and tenderness with onychomycosis and diabetes noted at this time also debridement multiple keratoses hallux at MTP and IP joints bilateral following debridement the right first MTP to with lumicain and Neosporin and Band-Aid dressing reappointed 3 months for followup future continued palliative nail care as needed maintain appropriate coming shoes at all times next progress Harriet Masson DPM

## 2014-05-05 ENCOUNTER — Ambulatory Visit: Payer: Medicare Other

## 2014-05-05 ENCOUNTER — Ambulatory Visit (INDEPENDENT_AMBULATORY_CARE_PROVIDER_SITE_OTHER): Payer: Medicare Other

## 2014-05-05 DIAGNOSIS — M79673 Pain in unspecified foot: Secondary | ICD-10-CM

## 2014-05-05 DIAGNOSIS — Q828 Other specified congenital malformations of skin: Secondary | ICD-10-CM

## 2014-05-05 DIAGNOSIS — B351 Tinea unguium: Secondary | ICD-10-CM

## 2014-05-05 DIAGNOSIS — E1140 Type 2 diabetes mellitus with diabetic neuropathy, unspecified: Secondary | ICD-10-CM

## 2014-05-05 NOTE — Progress Notes (Signed)
   Subjective:    Patient ID: [name], female    DOB: 30-Mar-1926, 80 y.o.   MRN: [id number]  HPI  Pt presents for nail debridement  Review of Systems no new findings or systemic changes noted     Objective:   Physical Exam Lower extremity objective findings unchanged pedal pulses DP plus one over 4 PT thready to nonpalpable bilateral Refill time 3 seconds all digits epicritic sensation is diminished to the forefoot digits and arch. There is pinch callus the hallux IP joint bilateral and first MTP area right. No open wounds no ulcers no secondary infections nails thick brittle [place] friable dystrophic 1 through 5 bilateral with discoloration and friability. Digital contractures noted hammertoe deformities and mild HAV deformity noted.       Assessment & Plan:  Assessment diabetes with history of neuropathy and angiopathy. Painful mycotic brittle dystrophic probably nails debrided 1 through 5 bilateral. Also keratotic lesions debrided 3 hallux IP joints bilateral and first MTP area right the MTP area right is treated with lidocaine Neosporin and Band-Aid dressing following debridement maintain appropriate accommodative shoes and socks at all times. Recommended 3 month follow-up for continued palliative nail care  Harriet Masson DPM

## 2014-05-05 NOTE — Patient Instructions (Signed)
Diabetes and Foot Care Diabetes may cause you to have problems because of poor blood supply (circulation) to your feet and legs. This may cause the skin on your feet to become thinner, break easier, and heal more slowly. Your skin may become dry, and the skin may peel and crack. You may also have nerve damage in your legs and feet causing decreased feeling in them. You may not notice minor injuries to your feet that could lead to infections or more serious problems. Taking care of your feet is one of the most important things you can do for yourself.  HOME CARE INSTRUCTIONS  Wear shoes at all times, even in the house. Do not go barefoot. Bare feet are easily injured.  Check your feet daily for blisters, cuts, and redness. If you cannot see the bottom of your feet, use a mirror or ask someone for help.  Wash your feet with warm water (do not use hot water) and mild soap. Then pat your feet and the areas between your toes until they are completely dry. Do not soak your feet as this can dry your skin.  Apply a moisturizing lotion or petroleum jelly (that does not contain alcohol and is unscented) to the skin on your feet and to dry, brittle toenails. Do not apply lotion between your toes.  Trim your toenails straight across. Do not dig under them or around the cuticle. File the edges of your nails with an emery board or nail file.  Do not cut corns or calluses or try to remove them with medicine.  Wear clean socks or stockings every day. Make sure they are not too tight. Do not wear knee-high stockings since they may decrease blood flow to your legs.  Wear shoes that fit properly and have enough cushioning. To break in new shoes, wear them for just a few hours a day. This prevents you from injuring your feet. Always look in your shoes before you put them on to be sure there are no objects inside.  Do not cross your legs. This may decrease the blood flow to your feet.  If you find a minor scrape,  cut, or break in the skin on your feet, keep it and the skin around it clean and dry. These areas may be cleansed with mild soap and water. Do not cleanse the area with peroxide, alcohol, or iodine.  When you remove an adhesive bandage, be sure not to damage the skin around it.  If you have a wound, look at it several times a day to make sure it is healing.  Do not use heating pads or hot water bottles. They may burn your skin. If you have lost feeling in your feet or legs, you may not know it is happening until it is too late.  Make sure your health care provider performs a complete foot exam at least annually or more often if you have foot problems. Report any cuts, sores, or bruises to your health care provider immediately. SEEK MEDICAL CARE IF:   You have an injury that is not healing.  You have cuts or breaks in the skin.  You have an ingrown nail.  You notice redness on your legs or feet.  You feel burning or tingling in your legs or feet.  You have pain or cramps in your legs and feet.  Your legs or feet are numb.  Your feet always feel cold. SEEK IMMEDIATE MEDICAL CARE IF:   There is increasing redness,   swelling, or pain in or around a wound.  There is a red line that goes up your leg.  Pus is coming from a wound.  You develop a fever or as directed by your health care provider.  You notice a bad smell coming from an ulcer or wound. Document Released: 05/12/2000 Document Revised: 01/15/2013 Document Reviewed: 10/22/2012 [employer] Patient Information 2015 [employer]. This information is not intended to replace advice given to you by your health care provider. Make sure you discuss any questions you have with your health care provider.  

## 2014-06-10 DIAGNOSIS — H401232 Low-tension glaucoma, bilateral, moderate stage: Secondary | ICD-10-CM | POA: Diagnosis not present

## 2014-08-04 ENCOUNTER — Ambulatory Visit

## 2014-08-19 ENCOUNTER — Ambulatory Visit (INDEPENDENT_AMBULATORY_CARE_PROVIDER_SITE_OTHER): Payer: Medicare Other | Admitting: Podiatry

## 2014-08-19 ENCOUNTER — Encounter: Payer: Self-pay | Admitting: Podiatry

## 2014-08-19 DIAGNOSIS — M79676 Pain in unspecified toe(s): Secondary | ICD-10-CM

## 2014-08-19 DIAGNOSIS — B351 Tinea unguium: Secondary | ICD-10-CM | POA: Diagnosis not present

## 2014-08-19 DIAGNOSIS — E1140 Type 2 diabetes mellitus with diabetic neuropathy, unspecified: Secondary | ICD-10-CM

## 2014-08-19 DIAGNOSIS — L84 Corns and callosities: Secondary | ICD-10-CM

## 2014-08-19 NOTE — Patient Instructions (Signed)
Diabetes and Foot Care Diabetes may cause you to have problems because of poor blood supply (circulation) to your feet and legs. This may cause the skin on your feet to become thinner, break easier, and heal more slowly. Your skin may become dry, and the skin may peel and crack. You may also have nerve damage in your legs and feet causing decreased feeling in them. You may not notice minor injuries to your feet that could lead to infections or more serious problems. Taking care of your feet is one of the most important things you can do for yourself.  HOME CARE INSTRUCTIONS  Wear shoes at all times, even in the house. Do not go barefoot. Bare feet are easily injured.  Check your feet daily for blisters, cuts, and redness. If you cannot see the bottom of your feet, use a mirror or ask someone for help.  Wash your feet with warm water (do not use hot water) and mild soap. Then pat your feet and the areas between your toes until they are completely dry. Do not soak your feet as this can dry your skin.  Apply a moisturizing lotion or petroleum jelly (that does not contain alcohol and is unscented) to the skin on your feet and to dry, brittle toenails. Do not apply lotion between your toes.  Trim your toenails straight across. Do not dig under them or around the cuticle. File the edges of your nails with an emery board or nail file.  Do not cut corns or calluses or try to remove them with medicine.  Wear clean socks or stockings every day. Make sure they are not too tight. Do not wear knee-high stockings since they may decrease blood flow to your legs.  Wear shoes that fit properly and have enough cushioning. To break in new shoes, wear them for just a few hours a day. This prevents you from injuring your feet. Always look in your shoes before you put them on to be sure there are no objects inside.  Do not cross your legs. This may decrease the blood flow to your feet.  If you find a minor scrape,  cut, or break in the skin on your feet, keep it and the skin around it clean and dry. These areas may be cleansed with mild soap and water. Do not cleanse the area with peroxide, alcohol, or iodine.  When you remove an adhesive bandage, be sure not to damage the skin around it.  If you have a wound, look at it several times a day to make sure it is healing.  Do not use heating pads or hot water bottles. They may burn your skin. If you have lost feeling in your feet or legs, you may not know it is happening until it is too late.  Make sure your health care provider performs a complete foot exam at least annually or more often if you have foot problems. Report any cuts, sores, or bruises to your health care provider immediately. SEEK MEDICAL CARE IF:   You have an injury that is not healing.  You have cuts or breaks in the skin.  You have an ingrown nail.  You notice redness on your legs or feet.  You feel burning or tingling in your legs or feet.  You have pain or cramps in your legs and feet.  Your legs or feet are numb.  Your feet always feel cold. SEEK IMMEDIATE MEDICAL CARE IF:   There is increasing redness,   swelling, or pain in or around a wound.  There is a red line that goes up your leg.  Pus is coming from a wound.  You develop a fever or as directed by your health care provider.  You notice a bad smell coming from an ulcer or wound. Document Released: 05/12/2000 Document Revised: 01/15/2013 Document Reviewed: 10/22/2012 [employer] Patient Information 2015 [employer]. This information is not intended to replace advice given to you by your health care provider. Make sure you discuss any questions you have with your health care provider.  

## 2014-08-20 NOTE — Progress Notes (Signed)
Patient ID: [name], female   DOB: 1926/01/30, 81 y.o.   MRN: [id number]  Subjective: Patient presents with her granddaughter and request debridement of painful toenails and keratoses.  Objective: The toenails are elongated, brittle, incurvated and tender to palpation 6-10 Keratoses hallux bilaterally Plantar callus right  Assessment History of diabetes: History of neuropathy and angiopathy Symptomatic onychomycoses 6-10 Keratoses 3  Plan: Debridement of toenails 10 and keratoses 3 without any bleeding  Reappoint at three-month intervals

## 2014-09-06 ENCOUNTER — Emergency Department (INDEPENDENT_AMBULATORY_CARE_PROVIDER_SITE_OTHER): Payer: Medicare Other

## 2014-09-06 ENCOUNTER — Emergency Department (INDEPENDENT_AMBULATORY_CARE_PROVIDER_SITE_OTHER)
Admission: EM | Admit: 2014-09-06 | Discharge: 2014-09-06 | Disposition: A | Discharge status: Home / Self Care | Payer: Medicare Other | Source: Home / Self Care | Attending: Family Medicine | Admitting: Family Medicine

## 2014-09-06 DIAGNOSIS — S93402A Sprain of unspecified ligament of left ankle, initial encounter: Secondary | ICD-10-CM | POA: Diagnosis not present

## 2014-09-06 NOTE — ED Provider Notes (Signed)
CSN: [id number]     Arrival date & time 09/06/14  0941 History   First MD Initiated Contact with Patient 09/06/14 1005     Chief Complaint  Patient presents with  . Fall   (Consider location/radiation/quality/duration/timing/severity/associated sxs/prior Treatment) HPI      81 year old female with a history of osteoporosis presents complaining of left lower leg and ankle pain. She tripped over something this morning and then started to express pain. She has been ambulatory since this happened. She denies any numbness. She denies any other injury. She does not take any blood thinners. She did not hit her head and has had no headache or loss of consciousness. No numbness or weakness.  Past Medical History  Diagnosis Date  . Diabetes mellitus without complication   . Hypertension   . Cancer     breast cancer   Past Surgical History  Procedure Laterality Date  . Abdominal hysterectomy    . Joint replacement      knees  . Fracture surgery      L femur  . Breast lumpectomy     No family history on file. History  Substance Use Topics  . Smoking status: Never Smoker   . Smokeless tobacco: Not on file  . Alcohol Use: No   OB History    No data available     Review of Systems  Musculoskeletal:       Left lower leg and ankle pain  All other systems reviewed and are negative.   Allergies  Codeine and Percocet  Home Medications   Prior to Admission medications   Medication Sig Start Date End Date Taking? Authorizing Provider  carvedilol (COREG) 12.5 MG tablet Take 12.5 mg by mouth 2 (two) times daily with a meal.   Yes Historical Provider, MD  ferrous sulfate 325 (65 FE) MG tablet Take 325 mg by mouth daily with breakfast.   Yes Historical Provider, MD  simvastatin (ZOCOR) 20 MG tablet Take 20 mg by mouth every evening.   Yes Historical Provider, MD  calcium carbonate (OS-CAL) 600 MG TABS Take 600 mg by mouth 2 (two) times daily with a meal.    Historical Provider, MD   cholecalciferol (VITAMIN D) 1000 UNITS tablet Take 2,000 Units by mouth daily.    Historical Provider, MD  LUTEIN PO Take 1 capsule by mouth daily.    Historical Provider, MD  MAGNESIUM-ZINC PO Take 1 tablet by mouth daily.    Historical Provider, MD  Multiple Vitamin (MULTIVITAMIN WITH MINERALS) TABS Take 1 tablet by mouth daily.    Historical Provider, MD  pioglitazone (ACTOS) 30 MG tablet Take 30 mg by mouth daily.    Historical Provider, MD  telmisartan (MICARDIS) 40 MG tablet Take 40 mg by mouth daily.    Historical Provider, MD  traMADol (ULTRAM) 50 MG tablet Take 1 tablet (50 mg total) by mouth every 6 (six) hours as needed for pain. 03/07/12   Davonna Belling, MD   BP 170/73 mmHg  Pulse 73  Temp(Src) 98 F (36.7 C) (Oral)  Resp 16  SpO2 98% Physical Exam  Constitutional: She is oriented to person, place, and time. Vital signs are normal. She appears well-developed and well-nourished. No distress.  HENT:  Head: Normocephalic and atraumatic.  Cardiovascular:  Pulses:      Dorsalis pedis pulses are 2+ on the left side.  Pulmonary/Chest: Effort normal. No respiratory distress.  Musculoskeletal:       Left knee: Normal.  Left ankle: She exhibits swelling (About the lateral malleolus). She exhibits normal range of motion, no deformity and normal pulse. Tenderness. Lateral malleolus tenderness found. No head of 5th metatarsal tenderness found. Achilles tendon normal.       Left lower leg: Normal.       Left foot: Normal.  Neurological: She is alert and oriented to person, place, and time. She has normal strength. Coordination normal.  Skin: Skin is warm and dry. No rash noted. She is not diaphoretic.  Psychiatric: She has a normal mood and affect. Judgment normal.  Nursing note and vitals reviewed.   ED Course  Procedures (including critical care time) Labs Review Labs Reviewed - No data to display  Imaging Review Dg Ankle Complete Left  09/06/2014   CLINICAL DATA:   Fall today with pain and swelling lateral ankle.  EXAM: LEFT ANKLE COMPLETE - 3+ VIEW  COMPARISON:  None.  FINDINGS: There is diffuse decreased bone mineralization. There is generalized soft tissue swelling over the ankle. Ankle mortise is normal. No acute fracture or dislocation. There are mild degenerative changes over the midfoot. Small calcaneal spur is present.  IMPRESSION: No acute fracture.   Electronically Signed   By: Marin Olp M.D.   On: 09/06/2014 11:26     MDM   1. Ankle sprain, left, initial encounter    X-rays negative for any fracture. Most likely a sprain, treated with an Ace wrap and a stirrup splint. Ice, elevation. Follow-up with primary care as necessary      Liam Graham, PA-C 09/06/14 1138

## 2014-09-06 NOTE — Discharge Instructions (Signed)

## 2014-09-06 NOTE — ED Notes (Signed)
Pt reports tripping over grandson's high chair and falling hitting left leg and ankle.  Incident happened this a.m.  Having pain in left leg and ankle.

## 2014-09-30 ENCOUNTER — Other Ambulatory Visit: Payer: Self-pay | Admitting: Internal Medicine

## 2014-09-30 DIAGNOSIS — Z9889 Other specified postprocedural states: Secondary | ICD-10-CM

## 2014-09-30 DIAGNOSIS — M810 Age-related osteoporosis without current pathological fracture: Secondary | ICD-10-CM

## 2014-09-30 DIAGNOSIS — Z1231 Encounter for screening mammogram for malignant neoplasm of breast: Secondary | ICD-10-CM

## 2014-10-23 ENCOUNTER — Ambulatory Visit
Admission: RE | Admit: 2014-10-23 | Discharge: 2014-10-23 | Disposition: A | Discharge status: Home / Self Care | Payer: Medicare Other | Source: Ambulatory Visit | Attending: Internal Medicine | Admitting: Internal Medicine

## 2014-10-23 DIAGNOSIS — Z9889 Other specified postprocedural states: Secondary | ICD-10-CM

## 2014-10-23 DIAGNOSIS — Z1231 Encounter for screening mammogram for malignant neoplasm of breast: Secondary | ICD-10-CM

## 2014-10-23 DIAGNOSIS — M810 Age-related osteoporosis without current pathological fracture: Secondary | ICD-10-CM

## 2014-11-18 ENCOUNTER — Ambulatory Visit (INDEPENDENT_AMBULATORY_CARE_PROVIDER_SITE_OTHER): Payer: Medicare Other | Admitting: Podiatry

## 2014-11-18 ENCOUNTER — Encounter: Payer: Self-pay | Admitting: Podiatry

## 2014-11-18 DIAGNOSIS — E1140 Type 2 diabetes mellitus with diabetic neuropathy, unspecified: Secondary | ICD-10-CM

## 2014-11-18 DIAGNOSIS — L84 Corns and callosities: Secondary | ICD-10-CM

## 2014-11-18 DIAGNOSIS — B351 Tinea unguium: Secondary | ICD-10-CM | POA: Diagnosis not present

## 2014-11-18 DIAGNOSIS — M79676 Pain in unspecified toe(s): Secondary | ICD-10-CM

## 2014-11-18 NOTE — Patient Instructions (Signed)
Diabetes and Foot Care Diabetes may cause you to have problems because of poor blood supply (circulation) to your feet and legs. This may cause the skin on your feet to become thinner, break easier, and heal more slowly. Your skin may become dry, and the skin may peel and crack. You may also have nerve damage in your legs and feet causing decreased feeling in them. You may not notice minor injuries to your feet that could lead to infections or more serious problems. Taking care of your feet is one of the most important things you can do for yourself.  HOME CARE INSTRUCTIONS  Wear shoes at all times, even in the house. Do not go barefoot. Bare feet are easily injured.  Check your feet daily for blisters, cuts, and redness. If you cannot see the bottom of your feet, use a mirror or ask someone for help.  Wash your feet with warm water (do not use hot water) and mild soap. Then pat your feet and the areas between your toes until they are completely dry. Do not soak your feet as this can dry your skin.  Apply a moisturizing lotion or petroleum jelly (that does not contain alcohol and is unscented) to the skin on your feet and to dry, brittle toenails. Do not apply lotion between your toes.  Trim your toenails straight across. Do not dig under them or around the cuticle. File the edges of your nails with an emery board or nail file.  Do not cut corns or calluses or try to remove them with medicine.  Wear clean socks or stockings every day. Make sure they are not too tight. Do not wear knee-high stockings since they may decrease blood flow to your legs.  Wear shoes that fit properly and have enough cushioning. To break in new shoes, wear them for just a few hours a day. This prevents you from injuring your feet. Always look in your shoes before you put them on to be sure there are no objects inside.  Do not cross your legs. This may decrease the blood flow to your feet.  If you find a minor scrape,  cut, or break in the skin on your feet, keep it and the skin around it clean and dry. These areas may be cleansed with mild soap and water. Do not cleanse the area with peroxide, alcohol, or iodine.  When you remove an adhesive bandage, be sure not to damage the skin around it.  If you have a wound, look at it several times a day to make sure it is healing.  Do not use heating pads or hot water bottles. They may burn your skin. If you have lost feeling in your feet or legs, you may not know it is happening until it is too late.  Make sure your health care provider performs a complete foot exam at least annually or more often if you have foot problems. Report any cuts, sores, or bruises to your health care provider immediately. SEEK MEDICAL CARE IF:   You have an injury that is not healing.  You have cuts or breaks in the skin.  You have an ingrown nail.  You notice redness on your legs or feet.  You feel burning or tingling in your legs or feet.  You have pain or cramps in your legs and feet.  Your legs or feet are numb.  Your feet always feel cold. SEEK IMMEDIATE MEDICAL CARE IF:   There is increasing redness,   swelling, or pain in or around a wound.  There is a red line that goes up your leg.  Pus is coming from a wound.  You develop a fever or as directed by your health care provider.  You notice a bad smell coming from an ulcer or wound. Document Released: 05/12/2000 Document Revised: 01/15/2013 Document Reviewed: 10/22/2012 [employer] Patient Information 2015 [employer]. This information is not intended to replace advice given to you by your health care provider. Make sure you discuss any questions you have with your health care provider.  

## 2014-11-19 NOTE — Progress Notes (Signed)
Patient ID: [name], female   DOB: 1925-06-13, 81 y.o.   MRN: [id number]  Subjective: This patient presents again force schedule visit complaining of painful toenails and keratoses.  Objective: The toenails are incurvated, brittle, discolored and tender to direct palpation Keratoses hallux bilaterally Plantar callus right  Assessment: Symptomatic onychomycoses 6-10 Keratoses 3 History of diabetes associated with neuropathy and angiopathy  Plan: Debridement of toenails 10 and keratoses 3 without any bleeding  Reappoint 3 months

## 2015-02-23 ENCOUNTER — Encounter: Payer: Self-pay | Admitting: Podiatry

## 2015-02-23 ENCOUNTER — Ambulatory Visit (INDEPENDENT_AMBULATORY_CARE_PROVIDER_SITE_OTHER): Payer: Medicare Other | Admitting: Podiatry

## 2015-02-23 DIAGNOSIS — L84 Corns and callosities: Secondary | ICD-10-CM

## 2015-02-23 DIAGNOSIS — M79676 Pain in unspecified toe(s): Secondary | ICD-10-CM

## 2015-02-23 DIAGNOSIS — E1140 Type 2 diabetes mellitus with diabetic neuropathy, unspecified: Secondary | ICD-10-CM

## 2015-02-23 DIAGNOSIS — B351 Tinea unguium: Secondary | ICD-10-CM | POA: Diagnosis not present

## 2015-02-23 NOTE — Progress Notes (Signed)
Patient ID: [name], female   DOB: 18-Sep-1925, 81 y.o.   MRN: [id number]  Subjective: This patient presents today for scheduled visit complaining of painful toenails and keratoses  Objective: The toenails are elongated, brittle, incurvated, discolored and tender to direct palpation 6-10 Keratoses hallux bilaterally and plantar right first MPJ  Assessment: Diabetic with a history of neuropathy and angiopathy Symptomatic onychomycoses 6-10 Keratoses 3  Plan: Debrided toenails 10 mechanically and electrically without any bleeding Debrided keratoses 3 without a bleeding  Reappoint 3 months

## 2015-02-23 NOTE — Patient Instructions (Signed)
Diabetes and Foot Care Diabetes may cause you to have problems because of poor blood supply (circulation) to your feet and legs. This may cause the skin on your feet to become thinner, break easier, and heal more slowly. Your skin may become dry, and the skin may peel and crack. You may also have nerve damage in your legs and feet causing decreased feeling in them. You may not notice minor injuries to your feet that could lead to infections or more serious problems. Taking care of your feet is one of the most important things you can do for yourself.  HOME CARE INSTRUCTIONS  Wear shoes at all times, even in the house. Do not go barefoot. Bare feet are easily injured.  Check your feet daily for blisters, cuts, and redness. If you cannot see the bottom of your feet, use a mirror or ask someone for help.  Wash your feet with warm water (do not use hot water) and mild soap. Then pat your feet and the areas between your toes until they are completely dry. Do not soak your feet as this can dry your skin.  Apply a moisturizing lotion or petroleum jelly (that does not contain alcohol and is unscented) to the skin on your feet and to dry, brittle toenails. Do not apply lotion between your toes.  Trim your toenails straight across. Do not dig under them or around the cuticle. File the edges of your nails with an emery board or nail file.  Do not cut corns or calluses or try to remove them with medicine.  Wear clean socks or stockings every day. Make sure they are not too tight. Do not wear knee-high stockings since they may decrease blood flow to your legs.  Wear shoes that fit properly and have enough cushioning. To break in new shoes, wear them for just a few hours a day. This prevents you from injuring your feet. Always look in your shoes before you put them on to be sure there are no objects inside.  Do not cross your legs. This may decrease the blood flow to your feet.  If you find a minor scrape,  cut, or break in the skin on your feet, keep it and the skin around it clean and dry. These areas may be cleansed with mild soap and water. Do not cleanse the area with peroxide, alcohol, or iodine.  When you remove an adhesive bandage, be sure not to damage the skin around it.  If you have a wound, look at it several times a day to make sure it is healing.  Do not use heating pads or hot water bottles. They may burn your skin. If you have lost feeling in your feet or legs, you may not know it is happening until it is too late.  Make sure your health care Magdalene Tardiff performs a complete foot exam at least annually or more often if you have foot problems. Report any cuts, sores, or bruises to your health care Tywon Niday immediately. SEEK MEDICAL CARE IF:   You have an injury that is not healing.  You have cuts or breaks in the skin.  You have an ingrown nail.  You notice redness on your legs or feet.  You feel burning or tingling in your legs or feet.  You have pain or cramps in your legs and feet.  Your legs or feet are numb.  Your feet always feel cold. SEEK IMMEDIATE MEDICAL CARE IF:   There is increasing redness,   swelling, or pain in or around a wound.  There is a red line that goes up your leg.  Pus is coming from a wound.  You develop a fever or as directed by your health care Amila Callies.  You notice a bad smell coming from an ulcer or wound. Document Released: 05/12/2000 Document Revised: 01/15/2013 Document Reviewed: 10/22/2012 Exit[employer] Patient Information 2015 [employer]. This information is not intended to replace advice given to you by your health care Jaun Galluzzo. Make sure you discuss any questions you have with your health care Hilmar Moldovan.  

## 2015-05-09 ENCOUNTER — Emergency Department (HOSPITAL_COMMUNITY): Payer: Medicare Other

## 2015-05-09 ENCOUNTER — Inpatient Hospital Stay (HOSPITAL_COMMUNITY)
Admission: EM | Admit: 2015-05-09 | Discharge: 2015-05-13 | DRG: 183 | Disposition: A | Discharge status: Home / Self Care | Payer: Medicare Other | Attending: Internal Medicine | Admitting: Internal Medicine

## 2015-05-09 ENCOUNTER — Encounter (HOSPITAL_COMMUNITY): Payer: Self-pay

## 2015-05-09 DIAGNOSIS — W010XXA Fall on same level from slipping, tripping and stumbling without subsequent striking against object, initial encounter: Secondary | ICD-10-CM | POA: Diagnosis present

## 2015-05-09 DIAGNOSIS — J9811 Atelectasis: Secondary | ICD-10-CM | POA: Diagnosis present

## 2015-05-09 DIAGNOSIS — I10 Essential (primary) hypertension: Secondary | ICD-10-CM | POA: Diagnosis present

## 2015-05-09 DIAGNOSIS — D649 Anemia, unspecified: Secondary | ICD-10-CM | POA: Diagnosis present

## 2015-05-09 DIAGNOSIS — M810 Age-related osteoporosis without current pathological fracture: Secondary | ICD-10-CM | POA: Diagnosis present

## 2015-05-09 DIAGNOSIS — Z79899 Other long term (current) drug therapy: Secondary | ICD-10-CM | POA: Diagnosis not present

## 2015-05-09 DIAGNOSIS — E872 Acidosis: Secondary | ICD-10-CM | POA: Diagnosis present

## 2015-05-09 DIAGNOSIS — E119 Type 2 diabetes mellitus without complications: Secondary | ICD-10-CM | POA: Diagnosis present

## 2015-05-09 DIAGNOSIS — S2231XA Fracture of one rib, right side, initial encounter for closed fracture: Secondary | ICD-10-CM | POA: Diagnosis not present

## 2015-05-09 DIAGNOSIS — I4891 Unspecified atrial fibrillation: Secondary | ICD-10-CM | POA: Diagnosis present

## 2015-05-09 DIAGNOSIS — R0902 Hypoxemia: Secondary | ICD-10-CM

## 2015-05-09 DIAGNOSIS — S2241XA Multiple fractures of ribs, right side, initial encounter for closed fracture: Principal | ICD-10-CM | POA: Diagnosis present

## 2015-05-09 DIAGNOSIS — S2249XA Multiple fractures of ribs, unspecified side, initial encounter for closed fracture: Secondary | ICD-10-CM | POA: Diagnosis present

## 2015-05-09 DIAGNOSIS — J9601 Acute respiratory failure with hypoxia: Secondary | ICD-10-CM | POA: Diagnosis present

## 2015-05-09 DIAGNOSIS — Z853 Personal history of malignant neoplasm of breast: Secondary | ICD-10-CM

## 2015-05-09 DIAGNOSIS — S42101A Fracture of unspecified part of scapula, right shoulder, initial encounter for closed fracture: Secondary | ICD-10-CM | POA: Diagnosis present

## 2015-05-09 DIAGNOSIS — S42109A Fracture of unspecified part of scapula, unspecified shoulder, initial encounter for closed fracture: Secondary | ICD-10-CM | POA: Diagnosis present

## 2015-05-09 DIAGNOSIS — N179 Acute kidney failure, unspecified: Secondary | ICD-10-CM | POA: Diagnosis present

## 2015-05-09 DIAGNOSIS — J811 Chronic pulmonary edema: Secondary | ICD-10-CM | POA: Diagnosis present

## 2015-05-09 DIAGNOSIS — M79601 Pain in right arm: Secondary | ICD-10-CM | POA: Diagnosis present

## 2015-05-09 DIAGNOSIS — Z885 Allergy status to narcotic agent status: Secondary | ICD-10-CM

## 2015-05-09 DIAGNOSIS — S2239XA Fracture of one rib, unspecified side, initial encounter for closed fracture: Secondary | ICD-10-CM | POA: Diagnosis present

## 2015-05-09 DIAGNOSIS — W19XXXA Unspecified fall, initial encounter: Secondary | ICD-10-CM | POA: Diagnosis present

## 2015-05-09 DIAGNOSIS — K430 Incisional hernia with obstruction, without gangrene: Secondary | ICD-10-CM | POA: Diagnosis present

## 2015-05-09 DIAGNOSIS — Y92009 Unspecified place in unspecified non-institutional (private) residence as the place of occurrence of the external cause: Secondary | ICD-10-CM | POA: Diagnosis not present

## 2015-05-09 HISTORY — DX: Personal history of malignant neoplasm of breast: Z85.3

## 2015-05-09 HISTORY — DX: Unspecified fall, initial encounter: W19.XXXA

## 2015-05-09 HISTORY — DX: Fracture of unspecified part of scapula, unspecified shoulder, initial encounter for closed fracture: S42.109A

## 2015-05-09 HISTORY — DX: Unspecified atrial fibrillation: I48.91

## 2015-05-09 HISTORY — DX: Multiple fractures of ribs, unspecified side, initial encounter for closed fracture: S22.49XA

## 2015-05-09 LAB — CBC WITH DIFFERENTIAL/PLATELET
BASOS PCT: 0 %
Basophils Absolute: 0 10*3/uL (ref 0.0–0.1)
EOS ABS: 0 10*3/uL (ref 0.0–0.7)
Eosinophils Relative: 0 %
HCT: 33.2 % — ABNORMAL LOW (ref 36.0–46.0)
Hemoglobin: 11.7 g/dL — ABNORMAL LOW (ref 12.0–15.0)
LYMPHS PCT: 9 %
Lymphs Abs: 1.2 10*3/uL (ref 0.7–4.0)
MCH: 29.8 pg (ref 26.0–34.0)
MCHC: 35.2 g/dL (ref 30.0–36.0)
MCV: 84.5 fL (ref 78.0–100.0)
Monocytes Absolute: 0.8 10*3/uL (ref 0.1–1.0)
Monocytes Relative: 6 %
NEUTROS ABS: 11.7 10*3/uL — AB (ref 1.7–7.7)
Neutrophils Relative %: 85 %
Platelets: 239 10*3/uL (ref 150–400)
RBC: 3.93 MIL/uL (ref 3.87–5.11)
RDW: 13.4 % (ref 11.5–15.5)
WBC: 13.7 10*3/uL — ABNORMAL HIGH (ref 4.0–10.5)

## 2015-05-09 LAB — I-STAT TROPONIN, ED: TROPONIN I, POC: 0.02 ng/mL (ref 0.00–0.08)

## 2015-05-09 LAB — COMPREHENSIVE METABOLIC PANEL
ALK PHOS: 56 U/L (ref 38–126)
ALT: 13 U/L — ABNORMAL LOW (ref 14–54)
ANION GAP: 11 (ref 5–15)
AST: 19 U/L (ref 15–41)
Albumin: 3.9 g/dL (ref 3.5–5.0)
BUN: 50 mg/dL — ABNORMAL HIGH (ref 6–20)
CHLORIDE: 110 mmol/L (ref 101–111)
CO2: 18 mmol/L — ABNORMAL LOW (ref 22–32)
Calcium: 9.2 mg/dL (ref 8.9–10.3)
Creatinine, Ser: 2.1 mg/dL — ABNORMAL HIGH (ref 0.44–1.00)
GFR calc Af Amer: 23 mL/min — ABNORMAL LOW (ref >60)
GFR calc non Af Amer: 20 mL/min — ABNORMAL LOW (ref >60)
Glucose, Bld: 170 mg/dL — ABNORMAL HIGH (ref 65–99)
Potassium: 4.2 mmol/L (ref 3.5–5.1)
SODIUM: 139 mmol/L (ref 135–145)
Total Bilirubin: 0.8 mg/dL (ref 0.3–1.2)
Total Protein: 6.5 g/dL (ref 6.5–8.1)

## 2015-05-09 LAB — MRSA PCR SCREENING: MRSA by PCR: NEGATIVE

## 2015-05-09 LAB — BRAIN NATRIURETIC PEPTIDE: B NATRIURETIC PEPTIDE 5: 587.2 pg/mL — AB (ref 0.0–100.0)

## 2015-05-09 MED ORDER — PHENOL 1.4 % MT LIQD
2.0000 | OROMUCOSAL | Status: DC | PRN
  Filled 2015-05-09: qty 177

## 2015-05-09 MED ORDER — SIMVASTATIN 20 MG PO TABS
20.0000 mg | ORAL_TABLET | Freq: Every evening | ORAL | Status: DC
  Administered 2015-05-10 – 2015-05-12 (×3): 20 mg via ORAL
  Filled 2015-05-09 (×2): qty 1
  Filled 2015-05-09: qty 2

## 2015-05-09 MED ORDER — ALUM & MAG HYDROXIDE-SIMETH 200-200-20 MG/5ML PO SUSP: 30.0000 mL | Freq: Four times a day (QID) | ORAL | Status: DC | PRN

## 2015-05-09 MED ORDER — METHOCARBAMOL 1000 MG/10ML IJ SOLN
1000.0000 mg | Freq: Four times a day (QID) | INTRAVENOUS | Status: DC | PRN
  Filled 2015-05-09: qty 10

## 2015-05-09 MED ORDER — LIP MEDEX EX OINT
1.0000 "application " | TOPICAL_OINTMENT | Freq: Two times a day (BID) | CUTANEOUS | Status: DC
  Administered 2015-05-09 – 2015-05-13 (×8): 1 via TOPICAL
  Filled 2015-05-09 (×2): qty 7

## 2015-05-09 MED ORDER — CARVEDILOL 25 MG PO TABS
25.0000 mg | ORAL_TABLET | Freq: Every evening | ORAL | Status: DC
  Administered 2015-05-09 – 2015-05-12 (×4): 25 mg via ORAL
  Filled 2015-05-09 (×2): qty 2
  Filled 2015-05-09 (×2): qty 1

## 2015-05-09 MED ORDER — SODIUM CHLORIDE 0.9 % IJ SOLN: 3.0000 mL | INTRAMUSCULAR | Status: DC | PRN

## 2015-05-09 MED ORDER — CARVEDILOL 12.5 MG PO TABS
12.5000 mg | ORAL_TABLET | Freq: Every day | ORAL | Status: DC
  Administered 2015-05-10 – 2015-05-13 (×4): 12.5 mg via ORAL
  Filled 2015-05-09 (×4): qty 1

## 2015-05-09 MED ORDER — HYDROMORPHONE HCL 1 MG/ML IJ SOLN
0.5000 mg | INTRAMUSCULAR | Status: DC | PRN
  Administered 2015-05-09: 1 mg via INTRAVENOUS
  Filled 2015-05-09: qty 1

## 2015-05-09 MED ORDER — SODIUM CHLORIDE 0.9 % IV BOLUS (SEPSIS)
1000.0000 mL | Freq: Once | INTRAVENOUS | Status: AC
  Administered 2015-05-09: 1000 mL via INTRAVENOUS

## 2015-05-09 MED ORDER — ONDANSETRON HCL 4 MG PO TABS
4.0000 mg | ORAL_TABLET | Freq: Four times a day (QID) | ORAL | Status: DC | PRN
  Administered 2015-05-11: 4 mg via ORAL
  Filled 2015-05-09: qty 1

## 2015-05-09 MED ORDER — SODIUM CHLORIDE 0.9 % IV SOLN
250.0000 mL | INTRAVENOUS | Status: DC | PRN
  Administered 2015-05-10: 10 mL via INTRAVENOUS

## 2015-05-09 MED ORDER — TRAMADOL HCL 50 MG PO TABS
50.0000 mg | ORAL_TABLET | Freq: Two times a day (BID) | ORAL | Status: DC | PRN
Start: 2015-05-09 — End: 2015-05-13
  Administered 2015-05-10: 100 mg via ORAL
  Administered 2015-05-11 – 2015-05-12 (×3): 50 mg via ORAL
  Administered 2015-05-13: 100 mg via ORAL
  Administered 2015-05-13: 50 mg via ORAL
  Filled 2015-05-09: qty 2
  Filled 2015-05-09 (×2): qty 1
  Filled 2015-05-09: qty 2
  Filled 2015-05-09 (×2): qty 1

## 2015-05-09 MED ORDER — BISACODYL 10 MG RE SUPP: 10.0000 mg | Freq: Two times a day (BID) | RECTAL | Status: DC | PRN

## 2015-05-09 MED ORDER — SODIUM CHLORIDE 0.9 % IJ SOLN
3.0000 mL | Freq: Two times a day (BID) | INTRAMUSCULAR | Status: DC
  Administered 2015-05-09 – 2015-05-13 (×8): 3 mL via INTRAVENOUS

## 2015-05-09 MED ORDER — ENOXAPARIN SODIUM 30 MG/0.3ML ~~LOC~~ SOLN
30.0000 mg | SUBCUTANEOUS | Status: DC
  Administered 2015-05-09 – 2015-05-12 (×4): 30 mg via SUBCUTANEOUS
  Filled 2015-05-09 (×4): qty 0.3

## 2015-05-09 MED ORDER — FENTANYL CITRATE (PF) 100 MCG/2ML IJ SOLN
50.0000 ug | Freq: Once | INTRAMUSCULAR | Status: AC
Start: 2015-05-09 — End: 2015-05-09
  Administered 2015-05-09: 50 ug via INTRAVENOUS
  Filled 2015-05-09: qty 2

## 2015-05-09 MED ORDER — MENTHOL 3 MG MT LOZG: 1.0000 | LOZENGE | OROMUCOSAL | Status: DC | PRN

## 2015-05-09 MED ORDER — MAGIC MOUTHWASH
15.0000 mL | Freq: Four times a day (QID) | ORAL | Status: DC | PRN
  Filled 2015-05-09: qty 15

## 2015-05-09 MED ORDER — DILTIAZEM HCL 100 MG IV SOLR: 5.0000 mg/h | INTRAVENOUS | Status: DC

## 2015-05-09 MED ORDER — POLYETHYLENE GLYCOL 3350 17 G PO PACK
17.0000 g | PACK | Freq: Two times a day (BID) | ORAL | Status: DC
  Administered 2015-05-10 – 2015-05-13 (×8): 17 g via ORAL
  Filled 2015-05-09 (×7): qty 1

## 2015-05-09 MED ORDER — DEXTROSE 5 % IV SOLN
5.0000 mg/h | INTRAVENOUS | Status: DC
  Administered 2015-05-09: 5 mg/h via INTRAVENOUS
  Filled 2015-05-09: qty 100

## 2015-05-09 MED ORDER — FENTANYL CITRATE (PF) 100 MCG/2ML IJ SOLN
50.0000 ug | Freq: Once | INTRAMUSCULAR | Status: AC
  Administered 2015-05-09: 50 ug via INTRAVENOUS
  Filled 2015-05-09: qty 2

## 2015-05-09 MED ORDER — POLYETHYLENE GLYCOL 3350 17 G PO PACK: 17.0000 g | PACK | Freq: Two times a day (BID) | ORAL | Status: DC | PRN

## 2015-05-09 MED ORDER — ADULT MULTIVITAMIN W/MINERALS CH
1.0000 | ORAL_TABLET | Freq: Every day | ORAL | Status: DC
  Administered 2015-05-10 – 2015-05-13 (×4): 1 via ORAL
  Filled 2015-05-09 (×4): qty 1

## 2015-05-09 MED ORDER — MORPHINE SULFATE (PF) 4 MG/ML IV SOLN
4.0000 mg | Freq: Once | INTRAVENOUS | Status: AC
  Administered 2015-05-09: 4 mg via INTRAVENOUS
  Filled 2015-05-09: qty 1

## 2015-05-09 MED ORDER — ACETAMINOPHEN 325 MG PO TABS: 650.0000 mg | ORAL_TABLET | Freq: Four times a day (QID) | ORAL | Status: DC | PRN

## 2015-05-09 MED ORDER — HYDROMORPHONE HCL 1 MG/ML IJ SOLN
0.5000 mg | INTRAMUSCULAR | Status: DC | PRN
  Administered 2015-05-10 – 2015-05-13 (×11): 1 mg via INTRAVENOUS
  Administered 2015-05-13: 0.5 mg via INTRAVENOUS
  Administered 2015-05-13: 1 mg via INTRAVENOUS
  Filled 2015-05-09 (×13): qty 1

## 2015-05-09 MED ORDER — CALCIUM CARBONATE 1250 (500 CA) MG PO TABS
1250.0000 mg | ORAL_TABLET | Freq: Two times a day (BID) | ORAL | Status: DC
  Administered 2015-05-10 – 2015-05-13 (×7): 1250 mg via ORAL
  Filled 2015-05-09 (×7): qty 1

## 2015-05-09 MED ORDER — MAGNESIUM OXIDE 400 (241.3 MG) MG PO TABS
400.0000 mg | ORAL_TABLET | Freq: Every day | ORAL | Status: DC
  Administered 2015-05-10 – 2015-05-13 (×4): 400 mg via ORAL
  Filled 2015-05-09 (×4): qty 1

## 2015-05-09 MED ORDER — SODIUM CHLORIDE 0.9 % IV SOLN
INTRAVENOUS | Status: DC
  Administered 2015-05-09: 22:00:00 via INTRAVENOUS

## 2015-05-09 MED ORDER — ONDANSETRON HCL 4 MG/2ML IJ SOLN
4.0000 mg | Freq: Four times a day (QID) | INTRAMUSCULAR | Status: DC | PRN
  Administered 2015-05-13: 4 mg via INTRAVENOUS
  Filled 2015-05-09: qty 2

## 2015-05-09 MED ORDER — ACETAMINOPHEN 650 MG RE SUPP: 650.0000 mg | Freq: Four times a day (QID) | RECTAL | Status: DC | PRN

## 2015-05-09 MED ORDER — VITAMIN D 1000 UNITS PO TABS
2000.0000 [IU] | ORAL_TABLET | Freq: Every day | ORAL | Status: DC
  Administered 2015-05-10 – 2015-05-13 (×4): 2000 [IU] via ORAL
  Filled 2015-05-09 (×4): qty 2

## 2015-05-09 MED ORDER — ACETAMINOPHEN 500 MG PO TABS
1000.0000 mg | ORAL_TABLET | Freq: Three times a day (TID) | ORAL | Status: DC
  Administered 2015-05-10 – 2015-05-13 (×11): 1000 mg via ORAL
  Filled 2015-05-09 (×11): qty 2

## 2015-05-09 MED ORDER — FERROUS SULFATE 325 (65 FE) MG PO TABS
325.0000 mg | ORAL_TABLET | Freq: Two times a day (BID) | ORAL | Status: DC
  Administered 2015-05-10 – 2015-05-13 (×7): 325 mg via ORAL
  Filled 2015-05-09 (×7): qty 1

## 2015-05-09 NOTE — Discharge Instructions (Signed)
Scapular Fracture  A scapular fracture is a break in the large, triangular bone behind your shoulder (shoulder blade or scapula). This bone makes up the socket joint of your shoulder.  The scapula is well protected by muscles, so scapular fractures are unusual injuries. They often involve a lot of force. People who have a scapular fracture often have other injuries as well. These may be injuries to the lung, spine, head, shoulder, or ribs.  CAUSES   Typical causes of a scapular fracture include:  · A fall from a significant height.  · A car or motorcycle accident.  · A heavy, direct blow to the scapula.  SIGNS AND SYMPTOMS  The main symptom of a scapular fracture is severe pain when you try to move your arm. Other signs and symptoms include:  · Swelling behind the shoulder.  · Bruising.  · Holding the arm still and close to the body.  DIAGNOSIS  Your health care provider may suspect a scapular fracture based on your symptoms and the details of a recent injury. A physical exam will be done. Tests may also be done to confirm the diagnosis. These may include a regular X-ray or a CT scan. Your health care provider will also use these tests to check for injuries to other parts of your body.  TREATMENT  Treatment options for a scapular fracture include:  · Immobilization. Your health care provider may put your arm in a sling and wrap a support bandage around your chest. The health care provider will explain how to move your shoulder for the first week after your injury to prevent stiffness. The sling can be removed as your movement increases and your pain decreases.  · Surgery. Surgery is rarely needed. You may need surgery if the bone pieces are out of place (displaced fracture). You may also need surgery if the fracture causes the bone to be deformed. In this case, the broken scapula will be put back into position and held in place with a surgical plate and screws.  · Physical therapy. A physical therapist will teach  you exercises to stretch and strengthen your shoulder. The goal is to keep your shoulder from getting stiff or frozen. You may need to do these exercises for 6-12 months.  HOME CARE INSTRUCTIONS  · Apply ice to the back of your shoulder:    Put ice in a plastic bag.    Place a towel between your skin and the bag.    Leave the ice on for 20 minutes, 2-3 times per day.  · If you have a splint and a wrap, wear them all of the time. Remove them only to take a shower or to do your physical therapy exercises.  · Take medicines only as directed by your health care provider.  · Ask your health care provider:    When you can stop wearing the splint and wrap.    When you can do all of your usual activities again.  SEEK MEDICAL CARE IF:  · You have pain that is not helped by pain medicine.  · You are unable to do your physical therapy because of pain or stiffness.  SEEK IMMEDIATE MEDICAL CARE IF:  · You are short of breath.  · You cough up blood.  · You cannot move your arm or your fingers.     This information is not intended to replace advice given to you by your health care provider. Make sure you discuss any questions you have   with your health care provider.     Document Released: 05/15/2005 Document Revised: 06/05/2014 Document Reviewed: 10/15/2013  Elsevier Interactive Patient Education ©2016 [employer]

## 2015-05-09 NOTE — ED Provider Notes (Signed)
CSN: [id number]     Arrival date & time 05/09/15  1333 History   First MD Initiated Contact with Patient 05/09/15 1355     Chief Complaint  Patient presents with  . Fall    Patient is a 81 y.o. female presenting with fall. The history is provided by the patient and a relative. No language interpreter was used.  Fall   [name] is a 81 y.o. female who presents to the Emergency Department complaining of injuries related to a fall.  She was on the front porch and went to walk inside and tripped over the threshold falling backwards striking her head on the stove and her right arm. This happened just prior to ED arrival. She experienced immediate pain in the right arm. She denies any loss of consciousness, vomiting, abdominal pain. She has some pain throughout her right side and back. Symptoms are moderate, constant, worsening.  Past Medical History  Diagnosis Date  . Diabetes mellitus without complication ([place])   . Hypertension   . Cancer [facility])     breast cancer   Past Surgical History  Procedure Laterality Date  . Abdominal hysterectomy    . Joint replacement      knees  . Fracture surgery      L femur  . Breast lumpectomy     No family history on file. Social History  Substance Use Topics  . Smoking status: Never Smoker   . Smokeless tobacco: None  . Alcohol Use: No   OB History    No data available     Review of Systems  All other systems reviewed and are negative.     Allergies  Codeine and Percocet  Home Medications   Prior to Admission medications   Medication Sig Start Date End Date Taking? Authorizing Provider  calcium carbonate (OS-CAL) 600 MG TABS Take 600 mg by mouth 2 (two) times daily with a meal.    Historical Provider, MD  carvedilol (COREG) 12.5 MG tablet Take 12.5 mg by mouth 2 (two) times daily with a meal.    Historical Provider, MD  cholecalciferol (VITAMIN D) 1000 UNITS tablet Take 2,000 Units by mouth daily.    Historical Provider, MD   ferrous sulfate 325 (65 FE) MG tablet Take 325 mg by mouth daily with breakfast.    Historical Provider, MD  LUTEIN PO Take 1 capsule by mouth daily.    Historical Provider, MD  MAGNESIUM-ZINC PO Take 1 tablet by mouth daily.    Historical Provider, MD  Multiple Vitamin (MULTIVITAMIN WITH MINERALS) TABS Take 1 tablet by mouth daily.    Historical Provider, MD  pioglitazone (ACTOS) 30 MG tablet Take 30 mg by mouth daily.    Historical Provider, MD  simvastatin (ZOCOR) 20 MG tablet Take 20 mg by mouth every evening.    Historical Provider, MD  telmisartan (MICARDIS) 40 MG tablet Take 40 mg by mouth daily.    Historical Provider, MD  traMADol (ULTRAM) 50 MG tablet Take 1 tablet (50 mg total) by mouth every 6 (six) hours as needed for pain. 03/07/12   Davonna Belling, MD   BP 141/82 mmHg  Temp(Src) 97.9 F (36.6 C) (Oral)  Resp 16  SpO2 95% Physical Exam  Constitutional: She is oriented to person, place, and time. She appears well-developed and well-nourished.  Uncomfortable appearing  HENT:  Head: Normocephalic and atraumatic.  Eyes: Pupils are equal, round, and reactive to light.  Neck:  No C-spine tenderness  Cardiovascular: Normal rate and  regular rhythm.   No murmur heard. Pulmonary/Chest: Effort normal and breath sounds normal. No respiratory distress. She exhibits no tenderness.  Abdominal: Soft. There is no rebound and no guarding.  There is a large umbilical and ventral hernia that are soft. Patient has mild local tenderness in this area. She states this tenderness in the hernia is chronic.  Musculoskeletal:  Moderate tenderness throughout the right upper arm. No shoulder tenderness bilaterally. No elbow tenderness. 2+ radial pulses bilaterally. No thoracic or lumbar tenderness to palpation. No hip tenderness to palpation. Flexion and extension is intact at the elbow bilaterally.  Neurological: She is alert and oriented to person, place, and time.  5/5 grip strength in bilateral  upper extremities, sensation to light touch is intact throughout bilateral upper extremities.  Skin: Skin is warm and dry.  Psychiatric: She has a normal mood and affect. Her behavior is normal.  Nursing note and vitals reviewed.   ED Course  Procedures (including critical care time) Labs Review Labs Reviewed - No data to display  Imaging Review Dg Chest 1 View  05/09/2015  CLINICAL DATA:  Pain following fall.  History of breast carcinoma EXAM: CHEST 1 VIEW COMPARISON:  January 28, 2013 FINDINGS: There is mild generalized interstitial prominence. No frank edema or consolidation. Heart is enlarged with pulmonary vascularity within normal limits. There old healed rib fractures bilaterally. No acute fracture is evident. There is arthropathy in both shoulders and in the thoracic spine. No pneumothorax. No adenopathy. IMPRESSION: Mild generalized interstitial prominence without frank edema or consolidation. Suspect mild underlying inflammatory type change/ fibrotic change. Stable cardiac enlargement. Old fractures bilaterally without acute fracture evident. Extensive arthropathy in both shoulders. No pneumothorax. Electronically Signed   By: Lowella Grip III M.D.   On: 05/09/2015 15:39   Dg Shoulder Right  05/09/2015  CLINICAL DATA:  Fall today with right shoulder pain, initial encounter EXAM: RIGHT SHOULDER - 2+ VIEW COMPARISON:  None. FINDINGS: Mild degenerative changes of the acromioclavicular joint are seen. No dislocation of the humeral head is noted. Just central to the glenoid there is a mildly displaced fracture in the scapula. No underlying rib abnormality is noted. IMPRESSION: Mildly displaced scapular fracture. Electronically Signed   By: Inez Catalina M.D.   On: 05/09/2015 15:38   Ct Head Wo Contrast  05/09/2015  CLINICAL DATA:  Recent trip and fall with headaches and neck pain, initial encounter EXAM: CT HEAD WITHOUT CONTRAST CT CERVICAL SPINE WITHOUT CONTRAST TECHNIQUE:  Multidetector CT imaging of the head and cervical spine was performed following the standard protocol without intravenous contrast. Multiplanar CT image reconstructions of the cervical spine were also generated. COMPARISON:  None. FINDINGS: CT HEAD FINDINGS Bony calvarium is intact. Opacification of the left maxillary antrum is noted likely related chronic sinusitis. Diffuse atrophic changes and chronic white matter ischemic change is seen. No findings to suggest acute hemorrhage, acute infarction or space-occupying mass lesion are noted. CT CERVICAL SPINE FINDINGS Seven cervical segments are well visualized. Vertebral body height is well maintained. Diffuse facet hypertrophic changes are noted. No acute fracture or acute facet abnormality is noted in the cervical spine. In the right scapula, there is a displaced fracture similar to that seen on the prior shoulder films. This is best visualized on the sagittal reconstructions. No soft tissue abnormality is noted. IMPRESSION: CT of the head: Chronic atrophic and ischemic changes without acute abnormality. CT of the cervical spine:  Multilevel degenerative changes. Minimally displaced right scapular fracture. Electronically Signed  By: Inez Catalina M.D.   On: 05/09/2015 15:48   Ct Cervical Spine Wo Contrast  05/09/2015  CLINICAL DATA:  Recent trip and fall with headaches and neck pain, initial encounter EXAM: CT HEAD WITHOUT CONTRAST CT CERVICAL SPINE WITHOUT CONTRAST TECHNIQUE: Multidetector CT imaging of the head and cervical spine was performed following the standard protocol without intravenous contrast. Multiplanar CT image reconstructions of the cervical spine were also generated. COMPARISON:  None. FINDINGS: CT HEAD FINDINGS Bony calvarium is intact. Opacification of the left maxillary antrum is noted likely related chronic sinusitis. Diffuse atrophic changes and chronic white matter ischemic change is seen. No findings to suggest acute hemorrhage, acute  infarction or space-occupying mass lesion are noted. CT CERVICAL SPINE FINDINGS Seven cervical segments are well visualized. Vertebral body height is well maintained. Diffuse facet hypertrophic changes are noted. No acute fracture or acute facet abnormality is noted in the cervical spine. In the right scapula, there is a displaced fracture similar to that seen on the prior shoulder films. This is best visualized on the sagittal reconstructions. No soft tissue abnormality is noted. IMPRESSION: CT of the head: Chronic atrophic and ischemic changes without acute abnormality. CT of the cervical spine:  Multilevel degenerative changes. Minimally displaced right scapular fracture. Electronically Signed   By: Inez Catalina M.D.   On: 05/09/2015 15:48   Dg Humerus Right  05/09/2015  CLINICAL DATA:  Recent fall with right shoulder and arm pain. EXAM: RIGHT HUMERUS - 2+ VIEW COMPARISON:  None. FINDINGS: Humerus is well visualized and demonstrates no acute fracture or dislocation. There is again visualized fracture in the scapula similar to that seen on the shoulder films. IMPRESSION: Right scapular fracture.  No humeral fracture is noted. Electronically Signed   By: Inez Catalina M.D.   On: 05/09/2015 15:40   I have personally reviewed and evaluated these images and lab results as part of my medical decision-making.   EKG Interpretation None      MDM   Final diagnoses:  Fall    Patient here for evaluation of injuries following a mechanical fall. She reports pain to her right upper arm. Imaging demonstrates right scapular fracture. Plan to obtain CT chest to better evaluate and rule out additional rib fractures or ptx.  Pt care transferred pending CT scan.  D/w Dr. Sharol Given regarding scapula fracture - recommends sling with outpatient follow up with Dr. Marlou Sa.     Quintella Reichert, MD 05/09/15 1755

## 2015-05-09 NOTE — H&P (Signed)
[facility] Admission History and Physical       [name] [id number] DOB: 02-Sep-1925 DOA: 05/09/2015  Referring physician: EDP PCP: Salena Saner., MD  Specialists:   Chief Complaint: Fall  HPI: [name] is a 81 y.o. female with a history of DM2, HTN and a remote history of Breast Cancer who presents to the ED with complaints of falling as she walked from her front porch and tripped on the threshold hitting her head and right arm on the stove.   She was evaluated in the ED and found to have fractures of the right scapula and multiple right ribs.  She was also found to have new atrial fibrillation.  She was referred for admission.   The EDP consulted Orthopedics to see the patient.      Review of Systems:  Constitutional: No Weight Loss, No Weight Gain, Night Sweats, Fevers, Chills, Dizziness, Light Headedness, Fatigue, or Generalized Weakness HEENT: No Headaches, Difficulty Swallowing,Tooth/Dental Problems,Sore Throat,  No Sneezing, Rhinitis, Ear Ache, Nasal Congestion, or Post Nasal Drip,  Cardio-vascular:  No Chest pain, Orthopnea, PND, Edema in Lower Extremities, Anasarca, Dizziness, Palpitations  Resp: No Dyspnea, No DOE, No Productive Cough, No Non-Productive Cough, No Hemoptysis, No Wheezing.    GI: No Heartburn, Indigestion, Abdominal Pain, Nausea, Vomiting, Diarrhea, Constipation, Hematemesis, Hematochezia, Melena, Change in Bowel Habits,  Loss of Appetite  GU: No Dysuria, No Change in Color of Urine, No Urgency or Urinary Frequency, No Flank pain.  Musculoskeletal: No Joint Pain or Swelling, No Decreased Range of Motion, No Back Pain.  Neurologic: No Syncope, No Seizures, Muscle Weakness, Paresthesia, Vision Disturbance or Loss, No Diplopia, No Vertigo, No Difficulty Walking,  Skin: No Rash or Lesions. Psych: No Change in Mood or Affect, No Depression or Anxiety, No Memory loss, No Confusion, or Hallucinations   Past Medical History  Diagnosis  Date  . Diabetes mellitus without complication ([place])   . Hypertension   . Cancer [facility])     breast cancer     Past Surgical History  Procedure Laterality Date  . Abdominal hysterectomy    . Joint replacement      knees  . Fracture surgery      L femur  . Breast lumpectomy        Prior to Admission medications   Medication Sig Start Date End Date Taking? Authorizing Provider  calcium carbonate (OS-CAL) 600 MG TABS Take 600 mg by mouth 2 (two) times daily with a meal.   Yes Historical Provider, MD  carvedilol (COREG) 12.5 MG tablet Take 12.5 mg by mouth daily with breakfast.    Yes Historical Provider, MD  carvedilol (COREG) 25 MG tablet Take 25 mg by mouth every evening.   Yes Historical Provider, MD  cholecalciferol (VITAMIN D) 1000 UNITS tablet Take 2,000 Units by mouth daily.   Yes Historical Provider, MD  diphenhydramine-acetaminophen (TYLENOL PM) 25-500 MG TABS tablet Take 2 tablets by mouth at bedtime as needed (for sleep).   Yes Historical Provider, MD  ferrous sulfate 325 (65 FE) MG tablet Take 325 mg by mouth 2 (two) times daily with a meal.    Yes Historical Provider, MD  LUTEIN PO Take 1 capsule by mouth daily.   Yes Historical Provider, MD  MAGNESIUM-ZINC PO Take 1 tablet by mouth daily.   Yes Historical Provider, MD  Multiple Vitamin (MULTIVITAMIN WITH MINERALS) TABS Take 1 tablet by mouth daily.   Yes Historical Provider, MD  pioglitazone (ACTOS) 30 MG tablet Take 30 mg  by mouth daily.   Yes Historical Provider, MD  simvastatin (ZOCOR) 20 MG tablet Take 20 mg by mouth every evening.   Yes Historical Provider, MD     Allergies  Allergen Reactions  . Codeine     "talks out of her head"  . Percocet [Oxycodone-Acetaminophen]     "talks out of her head"    Social History:  reports that she has never smoked. She does not have any smokeless tobacco history on file. She reports that she does not drink alcohol or use illicit drugs.    No family history on file.      Physical Exam:  GEN:  Pleasant Elderly Thin 81 y.o. Caucasian female examined and in discomfort but no acute distress; cooperative with exam Filed Vitals:   05/09/15 1549 05/09/15 1756 05/09/15 2029 05/09/15 2030  BP: 162/80 147/78 154/73 154/73  Pulse: 90 103 109 114  Temp:      TempSrc:      Resp: 25 23 18 17   SpO2: 91% 92% 94% 93%   Blood pressure 154/73, pulse 114, temperature 97.9 F (36.6 C), temperature source Oral, resp. rate 17, SpO2 93 %. PSYCH: She is alert and oriented x4; does not appear anxious does not appear depressed; affect is normal HEENT: Normocephalic and Atraumatic, Mucous membranes pink; PERRLA; EOM intact; Fundi:  Benign;  No scleral icterus, Nares: Patent, Oropharynx: Clear,    Neck:  FROM, No Cervical Lymphadenopathy nor Thyromegaly or Carotid Bruit; No JVD; Breasts:: Not examined CHEST WALL: No tenderness CHEST: Normal respiration, clear to auscultation bilaterally HEART: Irregular rate and rhythm; no murmurs rubs or gallops BACK: No kyphosis or scoliosis; No CVA tenderness ABDOMEN: Positive Bowel Sounds, Large Reducible mid-ABD Ventral Hernia, Soft Non-Tender, No Rebound or Guarding; No Masses, No Organomegaly.    Rectal Exam: Not done EXTREMITIES: No Cyanosis, Clubbing, or Edema; No Ulcerations. Genitalia: not examined PULSES: 2+ and symmetric SKIN: Normal hydration no rash or ulceration CNS:  Alert and Oriented x 4, No Focal Deficits Vascular: pulses palpable throughout    Labs on Admission:  Basic Metabolic Panel:  Recent Labs Lab 05/09/15 1815  NA 139  K 4.2  CL 110  CO2 18*  GLUCOSE 170*  BUN 50*  CREATININE 2.10*  CALCIUM 9.2   Liver Function Tests:  Recent Labs Lab 05/09/15 1815  AST 19  ALT 13*  ALKPHOS 56  BILITOT 0.8  PROT 6.5  ALBUMIN 3.9   No results for input(s): LIPASE, AMYLASE in the last 168 hours. No results for input(s): AMMONIA in the last 168 hours. CBC:  Recent Labs Lab 05/09/15 1815  WBC 13.7*   NEUTROABS 11.7*  HGB 11.7*  HCT 33.2*  MCV 84.5  PLT 239   Cardiac Enzymes: No results for input(s): CKTOTAL, CKMB, CKMBINDEX, TROPONINI in the last 168 hours.  BNP (last 3 results) No results for input(s): BNP in the last 8760 hours.  ProBNP (last 3 results) No results for input(s): PROBNP in the last 8760 hours.  CBG: No results for input(s): GLUCAP in the last 168 hours.  Radiological Exams on Admission: Dg Chest 1 View  05/09/2015  CLINICAL DATA:  Pain following fall.  History of breast carcinoma EXAM: CHEST 1 VIEW COMPARISON:  January 28, 2013 FINDINGS: There is mild generalized interstitial prominence. No frank edema or consolidation. Heart is enlarged with pulmonary vascularity within normal limits. There old healed rib fractures bilaterally. No acute fracture is evident. There is arthropathy in both shoulders and in the thoracic  spine. No pneumothorax. No adenopathy. IMPRESSION: Mild generalized interstitial prominence without frank edema or consolidation. Suspect mild underlying inflammatory type change/ fibrotic change. Stable cardiac enlargement. Old fractures bilaterally without acute fracture evident. Extensive arthropathy in both shoulders. No pneumothorax. Electronically Signed   By: Lowella Grip III M.D.   On: 05/09/2015 15:39   Dg Shoulder Right  05/09/2015  CLINICAL DATA:  Fall today with right shoulder pain, initial encounter EXAM: RIGHT SHOULDER - 2+ VIEW COMPARISON:  None. FINDINGS: Mild degenerative changes of the acromioclavicular joint are seen. No dislocation of the humeral head is noted. Just central to the glenoid there is a mildly displaced fracture in the scapula. No underlying rib abnormality is noted. IMPRESSION: Mildly displaced scapular fracture. Electronically Signed   By: Inez Catalina M.D.   On: 05/09/2015 15:38   Ct Head Wo Contrast  05/09/2015  CLINICAL DATA:  Recent trip and fall with headaches and neck pain, initial encounter EXAM: CT HEAD  WITHOUT CONTRAST CT CERVICAL SPINE WITHOUT CONTRAST TECHNIQUE: Multidetector CT imaging of the head and cervical spine was performed following the standard protocol without intravenous contrast. Multiplanar CT image reconstructions of the cervical spine were also generated. COMPARISON:  None. FINDINGS: CT HEAD FINDINGS Bony calvarium is intact. Opacification of the left maxillary antrum is noted likely related chronic sinusitis. Diffuse atrophic changes and chronic white matter ischemic change is seen. No findings to suggest acute hemorrhage, acute infarction or space-occupying mass lesion are noted. CT CERVICAL SPINE FINDINGS Seven cervical segments are well visualized. Vertebral body height is well maintained. Diffuse facet hypertrophic changes are noted. No acute fracture or acute facet abnormality is noted in the cervical spine. In the right scapula, there is a displaced fracture similar to that seen on the prior shoulder films. This is best visualized on the sagittal reconstructions. No soft tissue abnormality is noted. IMPRESSION: CT of the head: Chronic atrophic and ischemic changes without acute abnormality. CT of the cervical spine:  Multilevel degenerative changes. Minimally displaced right scapular fracture. Electronically Signed   By: Inez Catalina M.D.   On: 05/09/2015 15:48   Ct Chest Wo Contrast  05/09/2015  CLINICAL DATA:  Fall in home. Right posterior flank pain. Right arm pain. EXAM: CT CHEST WITHOUT CONTRAST TECHNIQUE: Multidetector CT imaging of the chest was performed following the standard protocol without IV contrast. COMPARISON:  05/09/2015 chest radiograph FINDINGS: Mediastinum/Nodes: Coronary, aortic arch, and branch vessel atherosclerotic vascular disease. Tortuous brachiocephalic artery. Moderate cardiomegaly. Small pericardial effusion. No pathologic thoracic adenopathy. Lungs/Pleura: Abnormal secondary pulmonary lobular interstitial accentuation favoring interstitial pulmonary edema.  Mild airway thickening. Mild atelectasis in both lower lobes. Hazy dependent density in the upper lobes could worsened select early airspace edema. Upper abdomen: Unremarkable Musculoskeletal: Mildly displaced and minimally comminuted right scapular fracture. Today' s exam was not optimized to assess this fracture, but it does appear to extend into the inferior glenoid articular surface on images 12-13 of series 5, and extend along the blade of the scapula inferiorly to the inferior scapular tip. There is severe degenerative arthropathy of both glenohumeral joints. There is some old healed right posterolateral rib fractures, and also acute nondisplaced rib fractures of the right eighth, ninth, and tenth ribs posteriorly. On the left side there numerous rib deformities compatible with old fractures but I do not see any new left rib fractures. Thoracic kyphosis and spondylosis. Suspected remote superior endplate compressions at T9 and T10. IMPRESSION: 1. Mildly displaced and minimally comminuted right scapular fracture extends from the  inferior glenoid to the inferior scapular tip. 2. Acute nondisplaced right eighth, ninth, and tenth rib fractures posteriorly. Old bilateral healed rib fractures are also present. 3. Remote superior endplate compression fractures at T9 and T10. 4. Moderate enlargement of the cardiopericardial silhouette with interstitial pulmonary edema and a small pericardial effusion. No pleural effusion. 5. Coronary, aortic arch, and branch vessel atherosclerotic vascular disease. 6. Mild atelectasis in both lower lobes. 7. Bilateral degenerative glenohumeral arthropathy. Electronically Signed   By: Van Clines M.D.   On: 05/09/2015 17:14   Ct Cervical Spine Wo Contrast  05/09/2015  CLINICAL DATA:  Recent trip and fall with headaches and neck pain, initial encounter EXAM: CT HEAD WITHOUT CONTRAST CT CERVICAL SPINE WITHOUT CONTRAST TECHNIQUE: Multidetector CT imaging of the head and  cervical spine was performed following the standard protocol without intravenous contrast. Multiplanar CT image reconstructions of the cervical spine were also generated. COMPARISON:  None. FINDINGS: CT HEAD FINDINGS Bony calvarium is intact. Opacification of the left maxillary antrum is noted likely related chronic sinusitis. Diffuse atrophic changes and chronic white matter ischemic change is seen. No findings to suggest acute hemorrhage, acute infarction or space-occupying mass lesion are noted. CT CERVICAL SPINE FINDINGS Seven cervical segments are well visualized. Vertebral body height is well maintained. Diffuse facet hypertrophic changes are noted. No acute fracture or acute facet abnormality is noted in the cervical spine. In the right scapula, there is a displaced fracture similar to that seen on the prior shoulder films. This is best visualized on the sagittal reconstructions. No soft tissue abnormality is noted. IMPRESSION: CT of the head: Chronic atrophic and ischemic changes without acute abnormality. CT of the cervical spine:  Multilevel degenerative changes. Minimally displaced right scapular fracture. Electronically Signed   By: Inez Catalina M.D.   On: 05/09/2015 15:48   Dg Humerus Right  05/09/2015  CLINICAL DATA:  Recent fall with right shoulder and arm pain. EXAM: RIGHT HUMERUS - 2+ VIEW COMPARISON:  None. FINDINGS: Humerus is well visualized and demonstrates no acute fracture or dislocation. There is again visualized fracture in the scapula similar to that seen on the shoulder films. IMPRESSION: Right scapular fracture.  No humeral fracture is noted. Electronically Signed   By: Inez Catalina M.D.   On: 05/09/2015 15:40     EKG: Independently reviewed. Atrial Fibrillation rate = 116, +LBBB        Assessment/Plan:    81 y.o. female with  Principal Problem:   1.      Atrial fibrillation with rapid ventricular response (HCC)   Crdiac Monitoring   IV Diltiazem Drip    (did not order  Full dose Lovenox due to her acute fractures)   Active Problems:   2.     Rib fractures and Scapula fracture   Pain control PRN   Orthopedics to see in AM      3.     Fall- Mechanical in [employer] Precautions     4.     Essential hypertension, benign   Continue Carvedilol Rx         5.     Diabetes mellitus without complication ([place])   Hold Actos Rx   SSI coverage PRN   Check HbA1C in AM     6.     History of breast cancer   Past Hx > 20 Years ago     7.     DVT Prophylaxis   Lovenox   Code Status:  FULL CODE        Family Communication:   Family at Bedside   Disposition Plan:    Inpatient Status        Time spent:  72 Minutes      [facility] Pager [phone]   If [place] Please Contact the Day Rounding Team MD for [facility]  If 7PM-7AM, Please Contact Night-Floor Coverage  [email or web address] Password [email or web address] 05/09/2015, 9:29 PM     ADDENDUM:   Patient was seen and examined on 05/09/2015

## 2015-05-09 NOTE — ED Notes (Signed)
She states she tripped whilst coming in her back door.  She c/o right arm and post. Right flank area pain.  She is awake, alert and in no distress.

## 2015-05-09 NOTE — ED Notes (Signed)
Patient back from radiology.

## 2015-05-09 NOTE — ED Notes (Signed)
Patient transported to X-ray 

## 2015-05-10 ENCOUNTER — Encounter (HOSPITAL_COMMUNITY): Payer: Self-pay | Admitting: *Deleted

## 2015-05-10 ENCOUNTER — Inpatient Hospital Stay (HOSPITAL_COMMUNITY): Payer: Medicare Other

## 2015-05-10 DIAGNOSIS — I4891 Unspecified atrial fibrillation: Secondary | ICD-10-CM

## 2015-05-10 DIAGNOSIS — N179 Acute kidney failure, unspecified: Secondary | ICD-10-CM

## 2015-05-10 HISTORY — DX: Acute kidney failure, unspecified: N17.9

## 2015-05-10 LAB — CBC
HEMATOCRIT: 33.1 % — AB (ref 36.0–46.0)
Hemoglobin: 11.5 g/dL — ABNORMAL LOW (ref 12.0–15.0)
MCH: 30.3 pg (ref 26.0–34.0)
MCHC: 34.7 g/dL (ref 30.0–36.0)
MCV: 87.1 fL (ref 78.0–100.0)
PLATELETS: 244 10*3/uL (ref 150–400)
RBC: 3.8 MIL/uL — ABNORMAL LOW (ref 3.87–5.11)
RDW: 13.7 % (ref 11.5–15.5)
WBC: 10.9 10*3/uL — AB (ref 4.0–10.5)

## 2015-05-10 LAB — BASIC METABOLIC PANEL
ANION GAP: 9 (ref 5–15)
BUN: 49 mg/dL — AB (ref 6–20)
CO2: 20 mmol/L — ABNORMAL LOW (ref 22–32)
Calcium: 8.9 mg/dL (ref 8.9–10.3)
Chloride: 110 mmol/L (ref 101–111)
Creatinine, Ser: 2.03 mg/dL — ABNORMAL HIGH (ref 0.44–1.00)
GFR calc Af Amer: 24 mL/min — ABNORMAL LOW (ref >60)
GFR, EST NON AFRICAN AMERICAN: 21 mL/min — AB (ref >60)
Glucose, Bld: 214 mg/dL — ABNORMAL HIGH (ref 65–99)
POTASSIUM: 4.5 mmol/L (ref 3.5–5.1)
SODIUM: 139 mmol/L (ref 135–145)

## 2015-05-10 LAB — GLUCOSE, CAPILLARY
Glucose-Capillary: 160 mg/dL — ABNORMAL HIGH (ref 65–99)
Glucose-Capillary: 180 mg/dL — ABNORMAL HIGH (ref 65–99)
Glucose-Capillary: 189 mg/dL — ABNORMAL HIGH (ref 65–99)

## 2015-05-10 LAB — CREATININE, URINE, RANDOM: CREATININE, URINE: 48.38 mg/dL

## 2015-05-10 LAB — TSH: TSH: 2.264 u[IU]/mL (ref 0.350–4.500)

## 2015-05-10 LAB — CK: CK TOTAL: 119 U/L (ref 38–234)

## 2015-05-10 LAB — SODIUM, URINE, RANDOM: Sodium, Ur: 109 mmol/L

## 2015-05-10 LAB — TROPONIN I

## 2015-05-10 MED ORDER — FUROSEMIDE 10 MG/ML IJ SOLN
20.0000 mg | Freq: Once | INTRAMUSCULAR | Status: AC
  Administered 2015-05-10: 20 mg via INTRAVENOUS
  Filled 2015-05-10: qty 2

## 2015-05-10 MED ORDER — POLYVINYL ALCOHOL 1.4 % OP SOLN
1.0000 [drp] | OPHTHALMIC | Status: DC | PRN
  Filled 2015-05-10: qty 15

## 2015-05-10 MED ORDER — INSULIN ASPART 100 UNIT/ML ~~LOC~~ SOLN: 0.0000 [IU] | Freq: Every day | SUBCUTANEOUS | Status: DC

## 2015-05-10 MED ORDER — INSULIN ASPART 100 UNIT/ML ~~LOC~~ SOLN
0.0000 [IU] | Freq: Three times a day (TID) | SUBCUTANEOUS | Status: DC
  Administered 2015-05-10 – 2015-05-11 (×3): 2 [IU] via SUBCUTANEOUS
  Administered 2015-05-11 – 2015-05-13 (×6): 1 [IU] via SUBCUTANEOUS
  Administered 2015-05-13: 2 [IU] via SUBCUTANEOUS

## 2015-05-10 NOTE — Progress Notes (Addendum)
[facility] Progress Note   [name]  [id number]  DOB: Sep 18, 1925  DOA: 05/09/2015 PCP: Salena Saner., MD  Brief narrative: [name] is a 81 y.o. female with osteoporosis, diabetes, hypertension and history of breast cancer who presents after a fall where she hit her right chest and shoulder onto the stove. She is found to have a right scapular fracture and fractures of the right 8, 9 and 10 ribs all with minimal displacement. She is also found to have new onset atrial fibrillation.  Subjective: Having pain in right chest. No dyspnea, cough, nausea, vomiting or diarrhea. No palpitations or chest pain.   Assessment/Plan: Principal Problem:   Atrial fibrillation with rapid ventricular response -CHA2DS2-VASc Score at least 5 - Cardizem infusion weaned off- - Rate controlled- Cont Coreg as she takes at home - hold off on anticoagulation for now as she has acute trauma and may be a fall risk- await PT eval to assess balance - f/u ECHO, TSH- last ECHO in 2007 revealed mild LV and RV failure  Active Problems:    Fall - PT eval- states she tripped- last fall was this summer due to tripping as well  Rib fractures - right #8, 9, 10  - IS - control pain with Tramadol -  watch for pneumonia   Right Scapula fracture - non displaced- follow- arm in sling - ortho eval requested by ER  AKI - last Cr in 01/2006 was 1.06 - received slow IV hydration overnight- will need to stop as CT reveals mild pulm edema - check CPK - check U sodium and Cr to determine FeNa  Metabolic acidosis - likely from acute renal failure - mild   Acute hypoxic resp failure (A)Pulm edema - noted on CT, elevated BNP, quite hypoxic but no resp distress- able to speak in full sentences -stop IVF - small dose of Lasix today - F/U ECHO  (B) Splinting/ atelectasis - IS, OOB in chair, ambulate, PT ordered    Essential hypertension, benign - cont Coreg    Diabetes mellitus without  complication  - Actos on hold- ISS ordered  Anemia -cont Ferrous Sulfate    History of breast cancer    Code Status:     Code Status Orders        Start     Ordered   05/09/15 2137  Full code   Continuous     05/09/15 2136     Family Communication:  Disposition Plan: follow in SDU today DVT prophylaxis: SCDs, Lovenox Consultants: trauma, ortho Procedures:   Antibiotics: Anti-infectives    None      Objective: Filed Weights   05/09/15 2200  Weight: 76.6 kg (168 lb 14 oz)    Intake/Output Summary (Last 24 hours) at 05/10/15 0814 Last data filed at 05/10/15 0600  Gross per 24 hour  Intake 638.41 ml  Output    125 ml  Net 513.41 ml     Vitals Filed Vitals:   05/10/15 0330 05/10/15 0400 05/10/15 0500 05/10/15 0600  BP: 114/71 119/53 134/41 141/66  Pulse: 60  70 78  Temp:  97.5 F (36.4 C)    TempSrc:  Oral    Resp: 14 13 13 14   Height:      Weight:      SpO2: 90% 91% 93% 91%    Exam:  General:  Pt is alert, not in acute distress  HEENT: No icterus, No thrush, oral mucosa moist  Cardiovascular: regular rate and rhythm, S1/S2 No murmur  Respiratory: very poor air entry- unable to hear crackles, no wheeze or rhonchi  Abdomen: Soft, +Bowel sounds, non tender, non distended, no guarding  MSK: No LE edema, cyanosis or clubbing- right arm in sling  Data Reviewed: Basic Metabolic Panel:  Recent Labs Lab 05/09/15 1815 05/10/15 0110  NA 139 139  K 4.2 4.5  CL 110 110  CO2 18* 20*  GLUCOSE 170* 214*  BUN 50* 49*  CREATININE 2.10* 2.03*  CALCIUM 9.2 8.9   Liver Function Tests:  Recent Labs Lab 05/09/15 1815  AST 19  ALT 13*  ALKPHOS 56  BILITOT 0.8  PROT 6.5  ALBUMIN 3.9   No results for input(s): LIPASE, AMYLASE in the last 168 hours. No results for input(s): AMMONIA in the last 168 hours. CBC:  Recent Labs Lab 05/09/15 1815 05/10/15 0110  WBC 13.7* 10.9*  NEUTROABS 11.7*  --   HGB 11.7* 11.5*  HCT 33.2* 33.1*  MCV  84.5 87.1  PLT 239 244   Cardiac Enzymes:  Recent Labs Lab 05/10/15 0110  TROPONINI <0.03   BNP (last 3 results)  Recent Labs  05/09/15 1815  BNP 587.2*    ProBNP (last 3 results) No results for input(s): PROBNP in the last 8760 hours.  CBG: No results for input(s): GLUCAP in the last 168 hours.  Recent Results (from the past 240 hour(s))  MRSA PCR Screening     Status: None   Collection Time: 05/09/15  9:37 PM  Result Value Ref Range Status   MRSA by PCR NEGATIVE NEGATIVE Final    Comment:        The GeneXpert MRSA Assay (FDA approved for NASAL specimens only), is one component of a comprehensive MRSA colonization surveillance program. It is not intended to diagnose MRSA infection nor to guide or monitor treatment for MRSA infections.      Studies: Dg Chest 1 View  05/09/2015  CLINICAL DATA:  Pain following fall.  History of breast carcinoma EXAM: CHEST 1 VIEW COMPARISON:  January 28, 2013 FINDINGS: There is mild generalized interstitial prominence. No frank edema or consolidation. Heart is enlarged with pulmonary vascularity within normal limits. There old healed rib fractures bilaterally. No acute fracture is evident. There is arthropathy in both shoulders and in the thoracic spine. No pneumothorax. No adenopathy. IMPRESSION: Mild generalized interstitial prominence without frank edema or consolidation. Suspect mild underlying inflammatory type change/ fibrotic change. Stable cardiac enlargement. Old fractures bilaterally without acute fracture evident. Extensive arthropathy in both shoulders. No pneumothorax. Electronically Signed   By: Lowella Grip III M.D.   On: 05/09/2015 15:39   Dg Shoulder Right  05/09/2015  CLINICAL DATA:  Fall today with right shoulder pain, initial encounter EXAM: RIGHT SHOULDER - 2+ VIEW COMPARISON:  None. FINDINGS: Mild degenerative changes of the acromioclavicular joint are seen. No dislocation of the humeral head is noted. Just  central to the glenoid there is a mildly displaced fracture in the scapula. No underlying rib abnormality is noted. IMPRESSION: Mildly displaced scapular fracture. Electronically Signed   By: Inez Catalina M.D.   On: 05/09/2015 15:38   Ct Head Wo Contrast  05/09/2015  CLINICAL DATA:  Recent trip and fall with headaches and neck pain, initial encounter EXAM: CT HEAD WITHOUT CONTRAST CT CERVICAL SPINE WITHOUT CONTRAST TECHNIQUE: Multidetector CT imaging of the head and cervical spine was performed following the standard protocol without intravenous contrast. Multiplanar CT image reconstructions of the cervical spine were also generated. COMPARISON:  None. FINDINGS: CT HEAD  FINDINGS Bony calvarium is intact. Opacification of the left maxillary antrum is noted likely related chronic sinusitis. Diffuse atrophic changes and chronic white matter ischemic change is seen. No findings to suggest acute hemorrhage, acute infarction or space-occupying mass lesion are noted. CT CERVICAL SPINE FINDINGS Seven cervical segments are well visualized. Vertebral body height is well maintained. Diffuse facet hypertrophic changes are noted. No acute fracture or acute facet abnormality is noted in the cervical spine. In the right scapula, there is a displaced fracture similar to that seen on the prior shoulder films. This is best visualized on the sagittal reconstructions. No soft tissue abnormality is noted. IMPRESSION: CT of the head: Chronic atrophic and ischemic changes without acute abnormality. CT of the cervical spine:  Multilevel degenerative changes. Minimally displaced right scapular fracture. Electronically Signed   By: Inez Catalina M.D.   On: 05/09/2015 15:48   Ct Chest Wo Contrast  05/09/2015  CLINICAL DATA:  Fall in home. Right posterior flank pain. Right arm pain. EXAM: CT CHEST WITHOUT CONTRAST TECHNIQUE: Multidetector CT imaging of the chest was performed following the standard protocol without IV contrast.  COMPARISON:  05/09/2015 chest radiograph FINDINGS: Mediastinum/Nodes: Coronary, aortic arch, and branch vessel atherosclerotic vascular disease. Tortuous brachiocephalic artery. Moderate cardiomegaly. Small pericardial effusion. No pathologic thoracic adenopathy. Lungs/Pleura: Abnormal secondary pulmonary lobular interstitial accentuation favoring interstitial pulmonary edema. Mild airway thickening. Mild atelectasis in both lower lobes. Hazy dependent density in the upper lobes could worsened select early airspace edema. Upper abdomen: Unremarkable Musculoskeletal: Mildly displaced and minimally comminuted right scapular fracture. Today' s exam was not optimized to assess this fracture, but it does appear to extend into the inferior glenoid articular surface on images 12-13 of series 5, and extend along the blade of the scapula inferiorly to the inferior scapular tip. There is severe degenerative arthropathy of both glenohumeral joints. There is some old healed right posterolateral rib fractures, and also acute nondisplaced rib fractures of the right eighth, ninth, and tenth ribs posteriorly. On the left side there numerous rib deformities compatible with old fractures but I do not see any new left rib fractures. Thoracic kyphosis and spondylosis. Suspected remote superior endplate compressions at T9 and T10. IMPRESSION: 1. Mildly displaced and minimally comminuted right scapular fracture extends from the inferior glenoid to the inferior scapular tip. 2. Acute nondisplaced right eighth, ninth, and tenth rib fractures posteriorly. Old bilateral healed rib fractures are also present. 3. Remote superior endplate compression fractures at T9 and T10. 4. Moderate enlargement of the cardiopericardial silhouette with interstitial pulmonary edema and a small pericardial effusion. No pleural effusion. 5. Coronary, aortic arch, and branch vessel atherosclerotic vascular disease. 6. Mild atelectasis in both lower lobes. 7.  Bilateral degenerative glenohumeral arthropathy. Electronically Signed   By: Van Clines M.D.   On: 05/09/2015 17:14   Ct Cervical Spine Wo Contrast  05/09/2015  CLINICAL DATA:  Recent trip and fall with headaches and neck pain, initial encounter EXAM: CT HEAD WITHOUT CONTRAST CT CERVICAL SPINE WITHOUT CONTRAST TECHNIQUE: Multidetector CT imaging of the head and cervical spine was performed following the standard protocol without intravenous contrast. Multiplanar CT image reconstructions of the cervical spine were also generated. COMPARISON:  None. FINDINGS: CT HEAD FINDINGS Bony calvarium is intact. Opacification of the left maxillary antrum is noted likely related chronic sinusitis. Diffuse atrophic changes and chronic white matter ischemic change is seen. No findings to suggest acute hemorrhage, acute infarction or space-occupying mass lesion are noted. CT CERVICAL SPINE FINDINGS Seven cervical  segments are well visualized. Vertebral body height is well maintained. Diffuse facet hypertrophic changes are noted. No acute fracture or acute facet abnormality is noted in the cervical spine. In the right scapula, there is a displaced fracture similar to that seen on the prior shoulder films. This is best visualized on the sagittal reconstructions. No soft tissue abnormality is noted. IMPRESSION: CT of the head: Chronic atrophic and ischemic changes without acute abnormality. CT of the cervical spine:  Multilevel degenerative changes. Minimally displaced right scapular fracture. Electronically Signed   By: Inez Catalina M.D.   On: 05/09/2015 15:48   Dg Humerus Right  05/09/2015  CLINICAL DATA:  Recent fall with right shoulder and arm pain. EXAM: RIGHT HUMERUS - 2+ VIEW COMPARISON:  None. FINDINGS: Humerus is well visualized and demonstrates no acute fracture or dislocation. There is again visualized fracture in the scapula similar to that seen on the shoulder films. IMPRESSION: Right scapular fracture.  No  humeral fracture is noted. Electronically Signed   By: Inez Catalina M.D.   On: 05/09/2015 15:40    Scheduled Meds:  Scheduled Meds: . acetaminophen  1,000 mg Oral TID  . calcium carbonate  1,250 mg Oral BID WC  . carvedilol  12.5 mg Oral Q breakfast  . carvedilol  25 mg Oral QPM  . cholecalciferol  2,000 Units Oral Daily  . enoxaparin (LOVENOX) injection  30 mg Subcutaneous Q24H  . ferrous sulfate  325 mg Oral BID WC  . lip balm  1 application Topical BID  . magnesium oxide  400 mg Oral Daily  . multivitamin with minerals  1 tablet Oral Daily  . polyethylene glycol  17 g Oral BID  . simvastatin  20 mg Oral QPM  . sodium chloride  3 mL Intravenous Q12H   Continuous Infusions: . sodium chloride 75 mL/hr at 05/09/15 2148  . diltiazem (CARDIZEM) infusion Stopped (05/10/15 0306)    Time spent on care of this patient: 29 min   Morse, MD 05/10/2015, 8:14 AM  LOS: 1 day   [facility] Office  [phone] Pager - Text Page per [email or web address] If 7PM-7AM, please contact night-coverage [email or web address]

## 2015-05-10 NOTE — Evaluation (Addendum)
Physical Therapy Evaluation Patient Details Name: [name] MRN: [id number] DOB: Oct 22, 1925 Today's Date: 05/10/2015   History of Present Illness  [name] is a 81 y.o. female with a history of DM2, HTN and a remote history of Breast Cancer who presents to the ED 05/09/15  with complaints of falling , hitting her head and right arm on the stove.Found to have  displaced and minimally comminuted right scapular fractures of 8,9,10 ribs on the  right. She was also found to have new atrial fibrillation. Patient has  A Chronic incarcerated ventral incisional hernia without obstruction.  Clinical Impression  [name] very pleasant, tolerated standing with HHA and taking small steps x 4  to recliner. Patient reports desire for Home. Sats dropped to 86 on RA during transfer. Replaced back on 5 l . sats > 91%. HR remained 80-90's.Pt admitted with above diagnosis. Pt currently with functional limitations due to the deficits listed below (see PT Problem List).  Pt will benefit from skilled PT to increase their independence and safety with mobility to allow discharge to SNF vs HH.   Follow Up Recommendations SNF;Supervision/Assistance - 24 hour;Home health PT (patient wants home, no caregiver/family present.)    Equipment Recommendations  None recommended by PT    Recommendations for Other Services OT consult     Precautions / Restrictions Precautions Precautions: Fall Required Braces or Orthoses: Sling      Mobility  Bed Mobility Overal bed mobility: Needs Assistance Bed Mobility: Supine to Sit     Supine to sit: Mod assist;HOB elevated     General bed mobility comments: extra time to  and assist for trunk to upright with increased pain of  R side and RUE.  Transfers Overall transfer level: Needs assistance Equipment used: 1 person hand held assist Transfers: Sit to/from [facility] Sit to Stand: Mod assist;+2 safety/equipment;From elevated  surface Stand pivot transfers: Mod assist;+2 safety/equipment       General transfer comment: HHA on the  L, use of safety belt for satbilizing after standing up. Small shuffle steps x 4 to recliner  Ambulation/Gait- 4 small steps to the recliner with HHA                Stairs            Wheelchair Mobility    Modified Rankin (Stroke Patients Only)       Balance Overall balance assessment: History of Falls;Needs assistance Sitting-balance support: Single extremity supported Sitting balance-Leahy Scale: Fair     Standing balance support: During functional activity;Single extremity supported Standing balance-Leahy Scale: Poor                               Pertinent Vitals/Pain Pain Assessment: Faces Faces Pain Scale: Hurts even more Pain Location: R arm and chest on the R. Pain Descriptors / Indicators: Aching;Discomfort;Grimacing Pain Intervention(s): Repositioned    Home Living Family/patient expects to be discharged to:: Private residence   Available Help at Discharge: Family Type of Home: House Home Access: Stairs to enter   Technical brewer of Steps: 1 Home Layout: One level Home Equipment: Environmental consultant - 2 wheels;Cane - single point;Bedside commode;Wheelchair - manual      Prior Function Level of Independence: Independent with assistive device(s)               Hand Dominance   Dominant Hand: Right    Extremity/Trunk Assessment   Upper Extremity  Assessment: Generalized weakness;RUE deficits/detail RUE Deficits / Details: in sling         Lower Extremity Assessment: Generalized weakness      Cervical / Trunk Assessment: Kyphotic  Communication   Communication: No difficulties  Cognition Arousal/Alertness: Awake/alert Behavior During Therapy: WFL for tasks assessed/performed Overall Cognitive Status: Within Functional Limits for tasks assessed                      General Comments      Exercises         Assessment/Plan    PT Assessment Patient needs continued PT services  PT Diagnosis Difficulty walking;Generalized weakness;Acute pain   PT Problem List Decreased strength;Decreased activity tolerance;Decreased balance;Decreased mobility;Decreased knowledge of precautions;Decreased safety awareness;Decreased knowledge of use of DME;Pain  PT Treatment Interventions DME instruction;Gait training;Functional mobility training;Therapeutic activities;Therapeutic exercise;Balance training;Stair training;Patient/family education   PT Goals (Current goals can be found in the Care Plan section) Acute Rehab PT Goals Patient Stated Goal: to go home PT Goal Formulation: With patient Time For Goal Achievement: 05/24/15 Potential to Achieve Goals: Good    Frequency Min 3X/week   Barriers to discharge        Co-evaluation               End of Session Equipment Utilized During Treatment: Gait belt Activity Tolerance: Patient limited by fatigue;Patient limited by pain Patient left: in chair;with call bell/phone within reach Nurse Communication: Mobility status         Time: 1145-1200 PT Time Calculation (min) (ACUTE ONLY): 15 min   Charges:   PT Evaluation $Initial PT Evaluation Tier I: 1 Procedure     PT G CodesClaretha Cooper 05/10/2015, 12:35 PM Tresa Endo PT [phone]

## 2015-05-10 NOTE — Care Management Note (Signed)
Case Management Note  Patient Details  Name: [name] MRN: [id number] Date of Birth: 12-17-1925  Subjective/Objective:    Falls at home new onset afib             Action/Plan:Date: May 10, 2015 Chart reviewed for concurrent status and case management needs. Will continue to follow patient for changes and needs: Velva Harman, RN, BSN, [place]   [phone]  Expected Discharge Date:                  Expected Discharge Plan:  Home/Self Care  In-House Referral:  NA  Discharge planning Services  CM Consult  Post Acute Care Choice:    Choice offered to:     DME Arranged:    DME Agency:     HH Arranged:    HH Agency:     Status of Service:  In process, will continue to follow  Medicare Important Message Given:    Date Medicare IM Given:    Medicare IM give by:    Date Additional Medicare IM Given:    Additional Medicare Important Message give by:     If discussed at [place] of Stay Meetings, dates discussed:    Additional Comments:  Leeroy Cha, RN 05/10/2015, 8:29 AM

## 2015-05-10 NOTE — Consult Note (Signed)
Reason for Consult:Right shoulder pain Referring Physician: Dr. Davy Pique is an 81 y.o. female.  HPI: On [name] is an 81 year old female with right shoulder pain. She had a fall yesterday and was evaluated in the emergency room. Her some concern about her fluctuating heart rate. She's currently in the intensive care unit. She reports right shoulder pain but denies any other orthopedic complaints.  Past Medical History  Diagnosis Date  . Diabetes mellitus without complication ([place])   . Hypertension   . Cancer [facility])     breast cancer    Past Surgical History  Procedure Laterality Date  . Abdominal hysterectomy    . Joint replacement      knees  . Fracture surgery      L femur  . Breast lumpectomy      History reviewed. No pertinent family history.  Social History:  reports that she has never smoked. She does not have any smokeless tobacco history on file. She reports that she does not drink alcohol or use illicit drugs.  Allergies:  Allergies  Allergen Reactions  . Codeine     "talks out of her head"  . Oxycodone Other (See Comments)    confusion    Medications: I have reviewed the patient's current medications.  Results for orders placed or performed during the hospital encounter of 05/09/15 (from the past 48 hour(s))  CBC with Differential     Status: Abnormal   Collection Time: 05/09/15  6:15 PM  Result Value Ref Range   WBC 13.7 (H) 4.0 - 10.5 K/uL   RBC 3.93 3.87 - 5.11 MIL/uL   Hemoglobin 11.7 (L) 12.0 - 15.0 g/dL   HCT 33.2 (L) 36.0 - 46.0 %   MCV 84.5 78.0 - 100.0 fL   MCH 29.8 26.0 - 34.0 pg   MCHC 35.2 30.0 - 36.0 g/dL   RDW 13.4 11.5 - 15.5 %   Platelets 239 150 - 400 K/uL   Neutrophils Relative % 85 %   Lymphocytes Relative 9 %   Monocytes Relative 6 %   Eosinophils Relative 0 %   Basophils Relative 0 %   Neutro Abs 11.7 (H) 1.7 - 7.7 K/uL   Lymphs Abs 1.2 0.7 - 4.0 K/uL   Monocytes Absolute 0.8 0.1 - 1.0 K/uL   Eosinophils Absolute 0.0  0.0 - 0.7 K/uL   Basophils Absolute 0.0 0.0 - 0.1 K/uL   Smear Review MORPHOLOGY UNREMARKABLE   Comprehensive metabolic panel     Status: Abnormal   Collection Time: 05/09/15  6:15 PM  Result Value Ref Range   Sodium 139 135 - 145 mmol/L   Potassium 4.2 3.5 - 5.1 mmol/L   Chloride 110 101 - 111 mmol/L   CO2 18 (L) 22 - 32 mmol/L   Glucose, Bld 170 (H) 65 - 99 mg/dL   BUN 50 (H) 6 - 20 mg/dL   Creatinine, Ser 2.10 (H) 0.44 - 1.00 mg/dL   Calcium 9.2 8.9 - 10.3 mg/dL   Total Protein 6.5 6.5 - 8.1 g/dL   Albumin 3.9 3.5 - 5.0 g/dL   AST 19 15 - 41 U/L   ALT 13 (L) 14 - 54 U/L   Alkaline Phosphatase 56 38 - 126 U/L   Total Bilirubin 0.8 0.3 - 1.2 mg/dL   GFR calc non Af Amer 20 (L) >60 mL/min   GFR calc Af Amer 23 (L) >60 mL/min    Comment: (NOTE) The eGFR has been calculated using the CKD EPI  equation. This calculation has not been validated in all clinical situations. eGFR's persistently <60 mL/min signify possible Chronic Kidney Disease.    Anion gap 11 5 - 15  Brain natriuretic peptide     Status: Abnormal   Collection Time: 05/09/15  6:15 PM  Result Value Ref Range   B Natriuretic Peptide 587.2 (H) 0.0 - 100.0 pg/mL  I-Stat Troponin, ED - 0, 3, 6 hours (not at [facility])     Status: None   Collection Time: 05/09/15  6:18 PM  Result Value Ref Range   Troponin i, poc 0.02 0.00 - 0.08 ng/mL   Comment 3            Comment: Due to the release kinetics of cTnI, a negative result within the first hours of the onset of symptoms does not rule out myocardial infarction with certainty. If myocardial infarction is still suspected, repeat the test at appropriate intervals.   MRSA PCR Screening     Status: None   Collection Time: 05/09/15  9:37 PM  Result Value Ref Range   MRSA by PCR NEGATIVE NEGATIVE    Comment:        The GeneXpert MRSA Assay (FDA approved for NASAL specimens only), is one component of a comprehensive MRSA colonization surveillance program. It is not intended  to diagnose MRSA infection nor to guide or monitor treatment for MRSA infections.   Basic metabolic panel     Status: Abnormal   Collection Time: 05/10/15  1:10 AM  Result Value Ref Range   Sodium 139 135 - 145 mmol/L   Potassium 4.5 3.5 - 5.1 mmol/L   Chloride 110 101 - 111 mmol/L   CO2 20 (L) 22 - 32 mmol/L   Glucose, Bld 214 (H) 65 - 99 mg/dL   BUN 49 (H) 6 - 20 mg/dL   Creatinine, Ser 2.03 (H) 0.44 - 1.00 mg/dL   Calcium 8.9 8.9 - 10.3 mg/dL   GFR calc non Af Amer 21 (L) >60 mL/min   GFR calc Af Amer 24 (L) >60 mL/min    Comment: (NOTE) The eGFR has been calculated using the CKD EPI equation. This calculation has not been validated in all clinical situations. eGFR's persistently <60 mL/min signify possible Chronic Kidney Disease.    Anion gap 9 5 - 15  CBC     Status: Abnormal   Collection Time: 05/10/15  1:10 AM  Result Value Ref Range   WBC 10.9 (H) 4.0 - 10.5 K/uL   RBC 3.80 (L) 3.87 - 5.11 MIL/uL   Hemoglobin 11.5 (L) 12.0 - 15.0 g/dL   HCT 33.1 (L) 36.0 - 46.0 %   MCV 87.1 78.0 - 100.0 fL   MCH 30.3 26.0 - 34.0 pg   MCHC 34.7 30.0 - 36.0 g/dL   RDW 13.7 11.5 - 15.5 %   Platelets 244 150 - 400 K/uL  Troponin I     Status: None   Collection Time: 05/10/15  1:10 AM  Result Value Ref Range   Troponin I <0.03 <0.031 ng/mL    Comment:        NO INDICATION OF MYOCARDIAL INJURY.   CK     Status: None   Collection Time: 05/10/15 12:35 PM  Result Value Ref Range   Total CK 119 38 - 234 U/L  TSH     Status: None   Collection Time: 05/10/15 12:35 PM  Result Value Ref Range   TSH 2.264 0.350 - 4.500 uIU/mL  Glucose, capillary  Status: Abnormal   Collection Time: 05/10/15 12:37 PM  Result Value Ref Range   Glucose-Capillary 160 (H) 65 - 99 mg/dL    Dg Chest 1 View  05/09/2015  CLINICAL DATA:  Pain following fall.  History of breast carcinoma EXAM: CHEST 1 VIEW COMPARISON:  January 28, 2013 FINDINGS: There is mild generalized interstitial prominence. No  frank edema or consolidation. Heart is enlarged with pulmonary vascularity within normal limits. There old healed rib fractures bilaterally. No acute fracture is evident. There is arthropathy in both shoulders and in the thoracic spine. No pneumothorax. No adenopathy. IMPRESSION: Mild generalized interstitial prominence without frank edema or consolidation. Suspect mild underlying inflammatory type change/ fibrotic change. Stable cardiac enlargement. Old fractures bilaterally without acute fracture evident. Extensive arthropathy in both shoulders. No pneumothorax. Electronically Signed   By: Lowella Grip III M.D.   On: 05/09/2015 15:39   Dg Shoulder Right  05/09/2015  CLINICAL DATA:  Fall today with right shoulder pain, initial encounter EXAM: RIGHT SHOULDER - 2+ VIEW COMPARISON:  None. FINDINGS: Mild degenerative changes of the acromioclavicular joint are seen. No dislocation of the humeral head is noted. Just central to the glenoid there is a mildly displaced fracture in the scapula. No underlying rib abnormality is noted. IMPRESSION: Mildly displaced scapular fracture. Electronically Signed   By: Inez Catalina M.D.   On: 05/09/2015 15:38   Ct Head Wo Contrast  05/09/2015  CLINICAL DATA:  Recent trip and fall with headaches and neck pain, initial encounter EXAM: CT HEAD WITHOUT CONTRAST CT CERVICAL SPINE WITHOUT CONTRAST TECHNIQUE: Multidetector CT imaging of the head and cervical spine was performed following the standard protocol without intravenous contrast. Multiplanar CT image reconstructions of the cervical spine were also generated. COMPARISON:  None. FINDINGS: CT HEAD FINDINGS Bony calvarium is intact. Opacification of the left maxillary antrum is noted likely related chronic sinusitis. Diffuse atrophic changes and chronic white matter ischemic change is seen. No findings to suggest acute hemorrhage, acute infarction or space-occupying mass lesion are noted. CT CERVICAL SPINE FINDINGS Seven  cervical segments are well visualized. Vertebral body height is well maintained. Diffuse facet hypertrophic changes are noted. No acute fracture or acute facet abnormality is noted in the cervical spine. In the right scapula, there is a displaced fracture similar to that seen on the prior shoulder films. This is best visualized on the sagittal reconstructions. No soft tissue abnormality is noted. IMPRESSION: CT of the head: Chronic atrophic and ischemic changes without acute abnormality. CT of the cervical spine:  Multilevel degenerative changes. Minimally displaced right scapular fracture. Electronically Signed   By: Inez Catalina M.D.   On: 05/09/2015 15:48   Ct Chest Wo Contrast  05/09/2015  CLINICAL DATA:  Fall in home. Right posterior flank pain. Right arm pain. EXAM: CT CHEST WITHOUT CONTRAST TECHNIQUE: Multidetector CT imaging of the chest was performed following the standard protocol without IV contrast. COMPARISON:  05/09/2015 chest radiograph FINDINGS: Mediastinum/Nodes: Coronary, aortic arch, and branch vessel atherosclerotic vascular disease. Tortuous brachiocephalic artery. Moderate cardiomegaly. Small pericardial effusion. No pathologic thoracic adenopathy. Lungs/Pleura: Abnormal secondary pulmonary lobular interstitial accentuation favoring interstitial pulmonary edema. Mild airway thickening. Mild atelectasis in both lower lobes. Hazy dependent density in the upper lobes could worsened select early airspace edema. Upper abdomen: Unremarkable Musculoskeletal: Mildly displaced and minimally comminuted right scapular fracture. Today' s exam was not optimized to assess this fracture, but it does appear to extend into the inferior glenoid articular surface on images 12-13 of series 5,  and extend along the blade of the scapula inferiorly to the inferior scapular tip. There is severe degenerative arthropathy of both glenohumeral joints. There is some old healed right posterolateral rib fractures, and  also acute nondisplaced rib fractures of the right eighth, ninth, and tenth ribs posteriorly. On the left side there numerous rib deformities compatible with old fractures but I do not see any new left rib fractures. Thoracic kyphosis and spondylosis. Suspected remote superior endplate compressions at T9 and T10. IMPRESSION: 1. Mildly displaced and minimally comminuted right scapular fracture extends from the inferior glenoid to the inferior scapular tip. 2. Acute nondisplaced right eighth, ninth, and tenth rib fractures posteriorly. Old bilateral healed rib fractures are also present. 3. Remote superior endplate compression fractures at T9 and T10. 4. Moderate enlargement of the cardiopericardial silhouette with interstitial pulmonary edema and a small pericardial effusion. No pleural effusion. 5. Coronary, aortic arch, and branch vessel atherosclerotic vascular disease. 6. Mild atelectasis in both lower lobes. 7. Bilateral degenerative glenohumeral arthropathy. Electronically Signed   By: Van Clines M.D.   On: 05/09/2015 17:14   Ct Cervical Spine Wo Contrast  05/09/2015  CLINICAL DATA:  Recent trip and fall with headaches and neck pain, initial encounter EXAM: CT HEAD WITHOUT CONTRAST CT CERVICAL SPINE WITHOUT CONTRAST TECHNIQUE: Multidetector CT imaging of the head and cervical spine was performed following the standard protocol without intravenous contrast. Multiplanar CT image reconstructions of the cervical spine were also generated. COMPARISON:  None. FINDINGS: CT HEAD FINDINGS Bony calvarium is intact. Opacification of the left maxillary antrum is noted likely related chronic sinusitis. Diffuse atrophic changes and chronic white matter ischemic change is seen. No findings to suggest acute hemorrhage, acute infarction or space-occupying mass lesion are noted. CT CERVICAL SPINE FINDINGS Seven cervical segments are well visualized. Vertebral body height is well maintained. Diffuse facet hypertrophic  changes are noted. No acute fracture or acute facet abnormality is noted in the cervical spine. In the right scapula, there is a displaced fracture similar to that seen on the prior shoulder films. This is best visualized on the sagittal reconstructions. No soft tissue abnormality is noted. IMPRESSION: CT of the head: Chronic atrophic and ischemic changes without acute abnormality. CT of the cervical spine:  Multilevel degenerative changes. Minimally displaced right scapular fracture. Electronically Signed   By: Inez Catalina M.D.   On: 05/09/2015 15:48   Dg Humerus Right  05/09/2015  CLINICAL DATA:  Recent fall with right shoulder and arm pain. EXAM: RIGHT HUMERUS - 2+ VIEW COMPARISON:  None. FINDINGS: Humerus is well visualized and demonstrates no acute fracture or dislocation. There is again visualized fracture in the scapula similar to that seen on the shoulder films. IMPRESSION: Right scapular fracture.  No humeral fracture is noted. Electronically Signed   By: Inez Catalina M.D.   On: 05/09/2015 15:40    Review of Systems  Constitutional: Negative.   HENT: Negative.   Eyes: Negative.   Respiratory: Negative.   Cardiovascular: Negative.   Gastrointestinal: Negative.   Genitourinary: Negative.   Musculoskeletal: Positive for joint pain.  Skin: Negative.   Neurological: Negative.   Endo/Heme/Allergies: Negative.   Psychiatric/Behavioral: Negative.    Blood pressure 158/72, pulse 84, temperature 97.7 F (36.5 C), temperature source Oral, resp. rate 21, height $RemoveBe'5\' 4"'[id number]$  (1.626 m), weight 76.6 kg (168 lb 14 oz), SpO2 92 %. Physical Exam  Constitutional: She appears well-developed.  HENT:  Head: Normocephalic.  Eyes: Pupils are equal, round, and reactive to light.  Neck: Normal range of motion.  Cardiovascular: Normal rate.   Respiratory: Effort normal.  Neurological: She is alert.  Skin: Skin is warm.  Psychiatric: She has a normal mood and affect.  Examination of the right shoulder  demonstrates  Swelling but palpable radial pulse intact EPL FPL interosseous function this pain bruising with wrist or elbow range of motion on the right-hand side left shoulder elbow wrist has good range of motion without crepitus grinding or ecchymosis. Motor sensory function the left arm is intact no pain with range of motion bilateral ankles knees or hips.  Assessment/Plan: Impression is right scapula fracture minimally displaced along with some rib fractures. Plan sling immobilization for 3 weeks followed by therapy assisted pendulums for total of 6 weeks. She'll need repeat radiographs in 10 days to make sure fracture displacement is not occurring in the meantime is okay for her to be out of sling for  Baths and showers only otherwise remain in the sling to facilitate healing she will need to come out of the sling at least once a day to straighten the arm to the elbow doesn't get too stiff  DEAN,GREGORY SCOTT 05/10/2015, 5:06 PM

## 2015-05-10 NOTE — Consult Note (Signed)
General Surgery [facility]  Reason for Consult: fall from level, scapula fracture, rib fractures  Referring Physician: [facility]  [name] is an 81 y.o. female.  HPI: patient is an 81 yo WF who fell at home witnessed by her granddaughter.  Struck right shoulder and chest on stove.  Evaluated in ER with multiple plain films and CT scans.  Mildly displaced and comminuted right scapula fracture.  Acute fractures of right ribs 8, 9, 10 with minimal displacement.  No pulmonary injury.  New onset atrial fibrillation noted.  Admitted to ICU/stepdown to medical service.  Previous breast surgery by Dr. Marylene Buerger from our practice.  Past Medical History  Diagnosis Date  . Diabetes mellitus without complication ([place])   . Hypertension   . Cancer [facility])     breast cancer    Past Surgical History  Procedure Laterality Date  . Abdominal hysterectomy    . Joint replacement      knees  . Fracture surgery      L femur  . Breast lumpectomy      No family history on file.  Social History:  reports that she has never smoked. She does not have any smokeless tobacco history on file. She reports that she does not drink alcohol or use illicit drugs.  Allergies:  Allergies  Allergen Reactions  . Codeine     "talks out of her head"  . Oxycodone Other (See Comments)    confusion    Medications: I have reviewed the patient's current medications.  Results for orders placed or performed during the hospital encounter of 05/09/15 (from the past 48 hour(s))  CBC with Differential     Status: Abnormal   Collection Time: 05/09/15  6:15 PM  Result Value Ref Range   WBC 13.7 (H) 4.0 - 10.5 K/uL   RBC 3.93 3.87 - 5.11 MIL/uL   Hemoglobin 11.7 (L) 12.0 - 15.0 g/dL   HCT 33.2 (L) 36.0 - 46.0 %   MCV 84.5 78.0 - 100.0 fL   MCH 29.8 26.0 - 34.0 pg   MCHC 35.2 30.0 - 36.0 g/dL   RDW 13.4 11.5 - 15.5 %   Platelets 239 150 - 400 K/uL   Neutrophils Relative % 85 %    Lymphocytes Relative 9 %   Monocytes Relative 6 %   Eosinophils Relative 0 %   Basophils Relative 0 %   Neutro Abs 11.7 (H) 1.7 - 7.7 K/uL   Lymphs Abs 1.2 0.7 - 4.0 K/uL   Monocytes Absolute 0.8 0.1 - 1.0 K/uL   Eosinophils Absolute 0.0 0.0 - 0.7 K/uL   Basophils Absolute 0.0 0.0 - 0.1 K/uL   Smear Review MORPHOLOGY UNREMARKABLE   Comprehensive metabolic panel     Status: Abnormal   Collection Time: 05/09/15  6:15 PM  Result Value Ref Range   Sodium 139 135 - 145 mmol/L   Potassium 4.2 3.5 - 5.1 mmol/L   Chloride 110 101 - 111 mmol/L   CO2 18 (L) 22 - 32 mmol/L   Glucose, Bld 170 (H) 65 - 99 mg/dL   BUN 50 (H) 6 - 20 mg/dL   Creatinine, Ser 2.10 (H) 0.44 - 1.00 mg/dL   Calcium 9.2 8.9 - 10.3 mg/dL   Total Protein 6.5 6.5 - 8.1 g/dL   Albumin 3.9 3.5 - 5.0 g/dL   AST 19 15 - 41 U/L   ALT 13 (L) 14 - 54 U/L   Alkaline Phosphatase 56 38 -  126 U/L   Total Bilirubin 0.8 0.3 - 1.2 mg/dL   GFR calc non Af Amer 20 (L) >60 mL/min   GFR calc Af Amer 23 (L) >60 mL/min    Comment: (NOTE) The eGFR has been calculated using the CKD EPI equation. This calculation has not been validated in all clinical situations. eGFR's persistently <60 mL/min signify possible Chronic Kidney Disease.    Anion gap 11 5 - 15  Brain natriuretic peptide     Status: Abnormal   Collection Time: 05/09/15  6:15 PM  Result Value Ref Range   B Natriuretic Peptide 587.2 (H) 0.0 - 100.0 pg/mL  I-Stat Troponin, ED - 0, 3, 6 hours (not at [facility])     Status: None   Collection Time: 05/09/15  6:18 PM  Result Value Ref Range   Troponin i, poc 0.02 0.00 - 0.08 ng/mL   Comment 3            Comment: Due to the release kinetics of cTnI, a negative result within the first hours of the onset of symptoms does not rule out myocardial infarction with certainty. If myocardial infarction is still suspected, repeat the test at appropriate intervals.   MRSA PCR Screening     Status: None   Collection Time: 05/09/15  9:37 PM   Result Value Ref Range   MRSA by PCR NEGATIVE NEGATIVE    Comment:        The GeneXpert MRSA Assay (FDA approved for NASAL specimens only), is one component of a comprehensive MRSA colonization surveillance program. It is not intended to diagnose MRSA infection nor to guide or monitor treatment for MRSA infections.   Basic metabolic panel     Status: Abnormal   Collection Time: 05/10/15  1:10 AM  Result Value Ref Range   Sodium 139 135 - 145 mmol/L   Potassium 4.5 3.5 - 5.1 mmol/L   Chloride 110 101 - 111 mmol/L   CO2 20 (L) 22 - 32 mmol/L   Glucose, Bld 214 (H) 65 - 99 mg/dL   BUN 49 (H) 6 - 20 mg/dL   Creatinine, Ser 2.03 (H) 0.44 - 1.00 mg/dL   Calcium 8.9 8.9 - 10.3 mg/dL   GFR calc non Af Amer 21 (L) >60 mL/min   GFR calc Af Amer 24 (L) >60 mL/min    Comment: (NOTE) The eGFR has been calculated using the CKD EPI equation. This calculation has not been validated in all clinical situations. eGFR's persistently <60 mL/min signify possible Chronic Kidney Disease.    Anion gap 9 5 - 15  CBC     Status: Abnormal   Collection Time: 05/10/15  1:10 AM  Result Value Ref Range   WBC 10.9 (H) 4.0 - 10.5 K/uL   RBC 3.80 (L) 3.87 - 5.11 MIL/uL   Hemoglobin 11.5 (L) 12.0 - 15.0 g/dL   HCT 33.1 (L) 36.0 - 46.0 %   MCV 87.1 78.0 - 100.0 fL   MCH 30.3 26.0 - 34.0 pg   MCHC 34.7 30.0 - 36.0 g/dL   RDW 13.7 11.5 - 15.5 %   Platelets 244 150 - 400 K/uL  Troponin I     Status: None   Collection Time: 05/10/15  1:10 AM  Result Value Ref Range   Troponin I <0.03 <0.031 ng/mL    Comment:        NO INDICATION OF MYOCARDIAL INJURY.     Dg Chest 1 View  05/09/2015  CLINICAL DATA:  Pain  following fall.  History of breast carcinoma EXAM: CHEST 1 VIEW COMPARISON:  January 28, 2013 FINDINGS: There is mild generalized interstitial prominence. No frank edema or consolidation. Heart is enlarged with pulmonary vascularity within normal limits. There old healed rib fractures bilaterally.  No acute fracture is evident. There is arthropathy in both shoulders and in the thoracic spine. No pneumothorax. No adenopathy. IMPRESSION: Mild generalized interstitial prominence without frank edema or consolidation. Suspect mild underlying inflammatory type change/ fibrotic change. Stable cardiac enlargement. Old fractures bilaterally without acute fracture evident. Extensive arthropathy in both shoulders. No pneumothorax. Electronically Signed   By: Lowella Grip III M.D.   On: 05/09/2015 15:39   Dg Shoulder Right  05/09/2015  CLINICAL DATA:  Fall today with right shoulder pain, initial encounter EXAM: RIGHT SHOULDER - 2+ VIEW COMPARISON:  None. FINDINGS: Mild degenerative changes of the acromioclavicular joint are seen. No dislocation of the humeral head is noted. Just central to the glenoid there is a mildly displaced fracture in the scapula. No underlying rib abnormality is noted. IMPRESSION: Mildly displaced scapular fracture. Electronically Signed   By: Inez Catalina M.D.   On: 05/09/2015 15:38   Ct Head Wo Contrast  05/09/2015  CLINICAL DATA:  Recent trip and fall with headaches and neck pain, initial encounter EXAM: CT HEAD WITHOUT CONTRAST CT CERVICAL SPINE WITHOUT CONTRAST TECHNIQUE: Multidetector CT imaging of the head and cervical spine was performed following the standard protocol without intravenous contrast. Multiplanar CT image reconstructions of the cervical spine were also generated. COMPARISON:  None. FINDINGS: CT HEAD FINDINGS Bony calvarium is intact. Opacification of the left maxillary antrum is noted likely related chronic sinusitis. Diffuse atrophic changes and chronic white matter ischemic change is seen. No findings to suggest acute hemorrhage, acute infarction or space-occupying mass lesion are noted. CT CERVICAL SPINE FINDINGS Seven cervical segments are well visualized. Vertebral body height is well maintained. Diffuse facet hypertrophic changes are noted. No acute fracture  or acute facet abnormality is noted in the cervical spine. In the right scapula, there is a displaced fracture similar to that seen on the prior shoulder films. This is best visualized on the sagittal reconstructions. No soft tissue abnormality is noted. IMPRESSION: CT of the head: Chronic atrophic and ischemic changes without acute abnormality. CT of the cervical spine:  Multilevel degenerative changes. Minimally displaced right scapular fracture. Electronically Signed   By: Inez Catalina M.D.   On: 05/09/2015 15:48   Ct Chest Wo Contrast  05/09/2015  CLINICAL DATA:  Fall in home. Right posterior flank pain. Right arm pain. EXAM: CT CHEST WITHOUT CONTRAST TECHNIQUE: Multidetector CT imaging of the chest was performed following the standard protocol without IV contrast. COMPARISON:  05/09/2015 chest radiograph FINDINGS: Mediastinum/Nodes: Coronary, aortic arch, and branch vessel atherosclerotic vascular disease. Tortuous brachiocephalic artery. Moderate cardiomegaly. Small pericardial effusion. No pathologic thoracic adenopathy. Lungs/Pleura: Abnormal secondary pulmonary lobular interstitial accentuation favoring interstitial pulmonary edema. Mild airway thickening. Mild atelectasis in both lower lobes. Hazy dependent density in the upper lobes could worsened select early airspace edema. Upper abdomen: Unremarkable Musculoskeletal: Mildly displaced and minimally comminuted right scapular fracture. Today' s exam was not optimized to assess this fracture, but it does appear to extend into the inferior glenoid articular surface on images 12-13 of series 5, and extend along the blade of the scapula inferiorly to the inferior scapular tip. There is severe degenerative arthropathy of both glenohumeral joints. There is some old healed right posterolateral rib fractures, and also acute nondisplaced rib  fractures of the right eighth, ninth, and tenth ribs posteriorly. On the left side there numerous rib deformities  compatible with old fractures but I do not see any new left rib fractures. Thoracic kyphosis and spondylosis. Suspected remote superior endplate compressions at T9 and T10. IMPRESSION: 1. Mildly displaced and minimally comminuted right scapular fracture extends from the inferior glenoid to the inferior scapular tip. 2. Acute nondisplaced right eighth, ninth, and tenth rib fractures posteriorly. Old bilateral healed rib fractures are also present. 3. Remote superior endplate compression fractures at T9 and T10. 4. Moderate enlargement of the cardiopericardial silhouette with interstitial pulmonary edema and a small pericardial effusion. No pleural effusion. 5. Coronary, aortic arch, and branch vessel atherosclerotic vascular disease. 6. Mild atelectasis in both lower lobes. 7. Bilateral degenerative glenohumeral arthropathy. Electronically Signed   By: Van Clines M.D.   On: 05/09/2015 17:14   Ct Cervical Spine Wo Contrast  05/09/2015  CLINICAL DATA:  Recent trip and fall with headaches and neck pain, initial encounter EXAM: CT HEAD WITHOUT CONTRAST CT CERVICAL SPINE WITHOUT CONTRAST TECHNIQUE: Multidetector CT imaging of the head and cervical spine was performed following the standard protocol without intravenous contrast. Multiplanar CT image reconstructions of the cervical spine were also generated. COMPARISON:  None. FINDINGS: CT HEAD FINDINGS Bony calvarium is intact. Opacification of the left maxillary antrum is noted likely related chronic sinusitis. Diffuse atrophic changes and chronic white matter ischemic change is seen. No findings to suggest acute hemorrhage, acute infarction or space-occupying mass lesion are noted. CT CERVICAL SPINE FINDINGS Seven cervical segments are well visualized. Vertebral body height is well maintained. Diffuse facet hypertrophic changes are noted. No acute fracture or acute facet abnormality is noted in the cervical spine. In the right scapula, there is a displaced  fracture similar to that seen on the prior shoulder films. This is best visualized on the sagittal reconstructions. No soft tissue abnormality is noted. IMPRESSION: CT of the head: Chronic atrophic and ischemic changes without acute abnormality. CT of the cervical spine:  Multilevel degenerative changes. Minimally displaced right scapular fracture. Electronically Signed   By: Inez Catalina M.D.   On: 05/09/2015 15:48   Dg Humerus Right  05/09/2015  CLINICAL DATA:  Recent fall with right shoulder and arm pain. EXAM: RIGHT HUMERUS - 2+ VIEW COMPARISON:  None. FINDINGS: Humerus is well visualized and demonstrates no acute fracture or dislocation. There is again visualized fracture in the scapula similar to that seen on the shoulder films. IMPRESSION: Right scapular fracture.  No humeral fracture is noted. Electronically Signed   By: Inez Catalina M.D.   On: 05/09/2015 15:40    Review of Systems  Constitutional: Negative.   HENT: Negative.   Eyes: Negative.   Respiratory: Negative.   Cardiovascular: Positive for chest pain.  Gastrointestinal: Negative.   Genitourinary: Negative.   Musculoskeletal: Positive for joint pain and falls.  Skin: Negative.   Neurological: Negative.   Endo/Heme/Allergies: Negative.   Psychiatric/Behavioral: Negative.    Blood pressure 141/66, pulse 78, temperature 97.5 F (36.4 C), temperature source Oral, resp. rate 14, height $RemoveBe'5\' 4"'[id number]$  (1.626 m), weight 76.6 kg (168 lb 14 oz), SpO2 91 %. Physical Exam  Constitutional: She is oriented to person, place, and time. She appears well-developed and well-nourished. No distress.  HENT:  Head: Normocephalic and atraumatic.  Right Ear: External ear normal.  Left Ear: External ear normal.  Eyes: Conjunctivae are normal. No scleral icterus.  Neck: Normal range of motion. Neck supple. No tracheal  deviation present. No thyromegaly present.  Cardiovascular: Normal rate and normal heart sounds.   Respiratory: Effort normal and breath  sounds normal. No respiratory distress. She has no wheezes. She exhibits tenderness (right lateral chest wall and shoulder; no ecchymosis).  GI: Soft. Bowel sounds are normal. She exhibits no distension and no mass. There is no tenderness. There is no rebound and no guarding.  Large incarcerated ventral hernia, chronic  Musculoskeletal: She exhibits tenderness (right shoulder; in sling).  Neurological: She is alert and oriented to person, place, and time.  Skin: Skin is warm and dry. She is not diaphoretic.  Psychiatric: She has a normal mood and affect. Her behavior is normal.    Assessment/Plan: Fall from level, multiple non-displaced right rib fractures (01/04/09)  Respiratory status appears stable in ICU  Encouraged IS use, pulmonary toilet - at risk for pneumonia from splinting  Pain controlled  No further intervention required for rib fractures Right scapula fracture  Dr. Marlou Sa (orthopedics) to evaluate per patient  Sling Chronic incarcerated ventral incisional hernia without obstruction  Asymptomatic  Patient refuses further surgical assessment  Will sign off.  Call if further surgical assessment required.  Earnstine Regal, MD, [facility] Office: [place] 05/10/2015, 8:00 AM

## 2015-05-10 NOTE — Progress Notes (Signed)
Echocardiogram 2D Echocardiogram has been performed.  [name] 05/10/2015, 3:15 PM

## 2015-05-11 DIAGNOSIS — E119 Type 2 diabetes mellitus without complications: Secondary | ICD-10-CM

## 2015-05-11 DIAGNOSIS — I4891 Unspecified atrial fibrillation: Secondary | ICD-10-CM

## 2015-05-11 DIAGNOSIS — I10 Essential (primary) hypertension: Secondary | ICD-10-CM

## 2015-05-11 DIAGNOSIS — N179 Acute kidney failure, unspecified: Secondary | ICD-10-CM

## 2015-05-11 LAB — CBC
HEMATOCRIT: 32 % — AB (ref 36.0–46.0)
Hemoglobin: 10.8 g/dL — ABNORMAL LOW (ref 12.0–15.0)
MCH: 29.1 pg (ref 26.0–34.0)
MCHC: 33.8 g/dL (ref 30.0–36.0)
MCV: 86.3 fL (ref 78.0–100.0)
Platelets: 196 10*3/uL (ref 150–400)
RBC: 3.71 MIL/uL — AB (ref 3.87–5.11)
RDW: 13.8 % (ref 11.5–15.5)
WBC: 10.9 10*3/uL — AB (ref 4.0–10.5)

## 2015-05-11 LAB — BASIC METABOLIC PANEL
ANION GAP: 7 (ref 5–15)
BUN: 49 mg/dL — ABNORMAL HIGH (ref 6–20)
CHLORIDE: 111 mmol/L (ref 101–111)
CO2: 20 mmol/L — ABNORMAL LOW (ref 22–32)
Calcium: 8.8 mg/dL — ABNORMAL LOW (ref 8.9–10.3)
Creatinine, Ser: 1.94 mg/dL — ABNORMAL HIGH (ref 0.44–1.00)
GFR, EST AFRICAN AMERICAN: 26 mL/min — AB (ref >60)
GFR, EST NON AFRICAN AMERICAN: 22 mL/min — AB (ref >60)
Glucose, Bld: 145 mg/dL — ABNORMAL HIGH (ref 65–99)
POTASSIUM: 3.8 mmol/L (ref 3.5–5.1)
SODIUM: 138 mmol/L (ref 135–145)

## 2015-05-11 LAB — GLUCOSE, CAPILLARY
GLUCOSE-CAPILLARY: 187 mg/dL — AB (ref 65–99)
GLUCOSE-CAPILLARY: 128 mg/dL — AB (ref 65–99)
GLUCOSE-CAPILLARY: 143 mg/dL — AB (ref 65–99)
Glucose-Capillary: 145 mg/dL — ABNORMAL HIGH (ref 65–99)

## 2015-05-11 LAB — BRAIN NATRIURETIC PEPTIDE: B Natriuretic Peptide: 592.4 pg/mL — ABNORMAL HIGH (ref 0.0–100.0)

## 2015-05-11 MED ORDER — FUROSEMIDE 10 MG/ML IJ SOLN
40.0000 mg | Freq: Once | INTRAMUSCULAR | Status: AC
Start: 2015-05-11 — End: 2015-05-11
  Administered 2015-05-11: 40 mg via INTRAVENOUS
  Filled 2015-05-11: qty 4

## 2015-05-11 NOTE — Progress Notes (Signed)
[facility] Progress Note   [name]  [id number]  DOB: [birth year]  DOA: 05/09/2015 PCP: Salena Saner., MD  Brief narrative: [name] is a 81 y.o. female with osteoporosis, diabetes, hypertension and history of breast cancer who presents after a fall where she hit her right chest and shoulder onto the stove. She is found to have a right scapular fracture and fractures of the right 8, 9 and 10 ribs all with minimal displacement. She is also found to have new onset atrial fibrillation.  Subjective: Having pain in right chest. No dyspnea, cough, nausea, vomiting or diarrhea. No palpitations or chest pain.   Assessment/Plan: Principal Problem:   Atrial fibrillation with rapid ventricular response - Cardizem infusion weaned off- - Rate controlled- Cont Coreg as she takes at home - hold off on anticoagulation for now as she has acute trauma and may be a fall risk -patient refusing SNF-- says she has 24/7 caregiving at home - ECHO, TSH ok- last ECHO in 2007 revealed mild LV and RV failure -has seen [facility] cards- Ron Parker in past -This patients CHA2DS2-VASc Score and unadjusted Ischemic Stroke Rate (% per year) is equal to 3    Fall - PT eval- states she tripped- last fall was this summer due to tripping as well -no anticoagulation  Rib fractures - right #8, 9, 10  - IS - control pain with Tramadol -  watch for pneumonia   Right Scapula fracture - non displaced- follow- arm in sling - ortho eval requested by ER  AKI - last Cr in 01/2006 was 1.06 - received slow IV hydration overnight- will need to stop as CT reveals mild pulm edema   Metabolic acidosis - likely from acute renal failure - mild   right scapula fracture  -seen by ortho -"minimally displaced along with some rib fractures. Plan sling immobilization for 3 weeks followed by therapy assisted pendulums for total of 6 weeks. She'll need repeat radiographs in 10 days to make sure fracture displacement  is not occurring in the meantime is okay for her to be out of sling for Baths and showers only otherwise remain in the sling to facilitate healing she will need to come out of the sling at least once a day to straighten the arm to the elbow doesn't get too stiff"  Acute hypoxic resp failure (A)Pulm edema - noted on CT, elevated BNP, quite hypoxic but no resp distress- able to speak in full sentences -stop IVF - I/Ox still +: lasix again -wean O2 as tolerated  (B) Splinting/ atelectasis - IS, OOB in chair, ambulate, PT ordered    Essential hypertension, benign - cont Coreg    Diabetes mellitus without complication  - Actos on hold- ISS ordered  Anemia -cont Ferrous Sulfate    History of breast cancer   Patient refusing SNF- wants to go home-- called daughter- no answer-- lives with granddaughter  Code Status:     Code Status Orders        Start     Ordered   05/09/15 2137  Full code   Continuous     05/09/15 2136      Disposition Plan: tx to tele bed DVT prophylaxis: SCDs, Lovenox Consultants: trauma, ortho Procedures:   Antibiotics: Anti-infectives    None      Objective: Filed Weights   05/09/15 2200  Weight: 76.6 kg (168 lb 14 oz)    Intake/Output Summary (Last 24 hours) at 05/11/15 0813 Last data filed at 05/11/15 0600  Gross per 24 hour  Intake    991 ml  Output   1250 ml  Net   -259 ml     Vitals Filed Vitals:   05/11/15 0300 05/11/15 0431 05/11/15 0500 05/11/15 0600  BP: 130/46  111/48 140/70  Pulse: 88  83 90  Temp:  98.3 F (36.8 C)    TempSrc:      Resp: 19  17 18   Height:      Weight:      SpO2: 89%  90% 91%    Exam:  General:  Pt is alert, not in acute distress  Cardiovascular: irregular  Respiratory: diminished, no wheezing  Abdomen: Soft, +Bowel sounds, non tender, non distended, no guarding  MSK: No LE edema, cyanosis or clubbing- right arm in sling  Data Reviewed: Basic Metabolic Panel:  Recent Labs Lab  05/09/15 1815 05/10/15 0110 05/11/15 0340  NA 139 139 138  K 4.2 4.5 3.8  CL 110 110 111  CO2 18* 20* 20*  GLUCOSE 170* 214* 145*  BUN 50* 49* 49*  CREATININE 2.10* 2.03* 1.94*  CALCIUM 9.2 8.9 8.8*   Liver Function Tests:  Recent Labs Lab 05/09/15 1815  AST 19  ALT 13*  ALKPHOS 56  BILITOT 0.8  PROT 6.5  ALBUMIN 3.9   No results for input(s): LIPASE, AMYLASE in the last 168 hours. No results for input(s): AMMONIA in the last 168 hours. CBC:  Recent Labs Lab 05/09/15 1815 05/10/15 0110 05/11/15 0340  WBC 13.7* 10.9* 10.9*  NEUTROABS 11.7*  --   --   HGB 11.7* 11.5* 10.8*  HCT 33.2* 33.1* 32.0*  MCV 84.5 87.1 86.3  PLT 239 244 196   Cardiac Enzymes:  Recent Labs Lab 05/10/15 0110 05/10/15 1235  CKTOTAL  --  119  TROPONINI <0.03  --    BNP (last 3 results)  Recent Labs  05/09/15 1815 05/11/15 0340  BNP 587.2* 592.4*    ProBNP (last 3 results) No results for input(s): PROBNP in the last 8760 hours.  CBG:  Recent Labs Lab 05/10/15 1237 05/10/15 1720 05/10/15 2136  GLUCAP 160* 180* 189*    Recent Results (from the past 240 hour(s))  MRSA PCR Screening     Status: None   Collection Time: 05/09/15  9:37 PM  Result Value Ref Range Status   MRSA by PCR NEGATIVE NEGATIVE Final    Comment:        The GeneXpert MRSA Assay (FDA approved for NASAL specimens only), is one component of a comprehensive MRSA colonization surveillance program. It is not intended to diagnose MRSA infection nor to guide or monitor treatment for MRSA infections.      Studies: Dg Chest 1 View  05/09/2015  CLINICAL DATA:  Pain following fall.  History of breast carcinoma EXAM: CHEST 1 VIEW COMPARISON:  January 28, 2013 FINDINGS: There is mild generalized interstitial prominence. No frank edema or consolidation. Heart is enlarged with pulmonary vascularity within normal limits. There old healed rib fractures bilaterally. No acute fracture is evident. There is  arthropathy in both shoulders and in the thoracic spine. No pneumothorax. No adenopathy. IMPRESSION: Mild generalized interstitial prominence without frank edema or consolidation. Suspect mild underlying inflammatory type change/ fibrotic change. Stable cardiac enlargement. Old fractures bilaterally without acute fracture evident. Extensive arthropathy in both shoulders. No pneumothorax. Electronically Signed   By: Lowella Grip III M.D.   On: 05/09/2015 15:39   Dg Shoulder Right  05/09/2015  CLINICAL DATA:  Fall today  with right shoulder pain, initial encounter EXAM: RIGHT SHOULDER - 2+ VIEW COMPARISON:  None. FINDINGS: Mild degenerative changes of the acromioclavicular joint are seen. No dislocation of the humeral head is noted. Just central to the glenoid there is a mildly displaced fracture in the scapula. No underlying rib abnormality is noted. IMPRESSION: Mildly displaced scapular fracture. Electronically Signed   By: Inez Catalina M.D.   On: 05/09/2015 15:38   Ct Head Wo Contrast  05/09/2015  CLINICAL DATA:  Recent trip and fall with headaches and neck pain, initial encounter EXAM: CT HEAD WITHOUT CONTRAST CT CERVICAL SPINE WITHOUT CONTRAST TECHNIQUE: Multidetector CT imaging of the head and cervical spine was performed following the standard protocol without intravenous contrast. Multiplanar CT image reconstructions of the cervical spine were also generated. COMPARISON:  None. FINDINGS: CT HEAD FINDINGS Bony calvarium is intact. Opacification of the left maxillary antrum is noted likely related chronic sinusitis. Diffuse atrophic changes and chronic white matter ischemic change is seen. No findings to suggest acute hemorrhage, acute infarction or space-occupying mass lesion are noted. CT CERVICAL SPINE FINDINGS Seven cervical segments are well visualized. Vertebral body height is well maintained. Diffuse facet hypertrophic changes are noted. No acute fracture or acute facet abnormality is noted in  the cervical spine. In the right scapula, there is a displaced fracture similar to that seen on the prior shoulder films. This is best visualized on the sagittal reconstructions. No soft tissue abnormality is noted. IMPRESSION: CT of the head: Chronic atrophic and ischemic changes without acute abnormality. CT of the cervical spine:  Multilevel degenerative changes. Minimally displaced right scapular fracture. Electronically Signed   By: Inez Catalina M.D.   On: 05/09/2015 15:48   Ct Chest Wo Contrast  05/09/2015  CLINICAL DATA:  Fall in home. Right posterior flank pain. Right arm pain. EXAM: CT CHEST WITHOUT CONTRAST TECHNIQUE: Multidetector CT imaging of the chest was performed following the standard protocol without IV contrast. COMPARISON:  05/09/2015 chest radiograph FINDINGS: Mediastinum/Nodes: Coronary, aortic arch, and branch vessel atherosclerotic vascular disease. Tortuous brachiocephalic artery. Moderate cardiomegaly. Small pericardial effusion. No pathologic thoracic adenopathy. Lungs/Pleura: Abnormal secondary pulmonary lobular interstitial accentuation favoring interstitial pulmonary edema. Mild airway thickening. Mild atelectasis in both lower lobes. Hazy dependent density in the upper lobes could worsened select early airspace edema. Upper abdomen: Unremarkable Musculoskeletal: Mildly displaced and minimally comminuted right scapular fracture. Today' s exam was not optimized to assess this fracture, but it does appear to extend into the inferior glenoid articular surface on images 12-13 of series 5, and extend along the blade of the scapula inferiorly to the inferior scapular tip. There is severe degenerative arthropathy of both glenohumeral joints. There is some old healed right posterolateral rib fractures, and also acute nondisplaced rib fractures of the right eighth, ninth, and tenth ribs posteriorly. On the left side there numerous rib deformities compatible with old fractures but I do not  see any new left rib fractures. Thoracic kyphosis and spondylosis. Suspected remote superior endplate compressions at T9 and T10. IMPRESSION: 1. Mildly displaced and minimally comminuted right scapular fracture extends from the inferior glenoid to the inferior scapular tip. 2. Acute nondisplaced right eighth, ninth, and tenth rib fractures posteriorly. Old bilateral healed rib fractures are also present. 3. Remote superior endplate compression fractures at T9 and T10. 4. Moderate enlargement of the cardiopericardial silhouette with interstitial pulmonary edema and a small pericardial effusion. No pleural effusion. 5. Coronary, aortic arch, and branch vessel atherosclerotic vascular disease. 6. Mild atelectasis  in both lower lobes. 7. Bilateral degenerative glenohumeral arthropathy. Electronically Signed   By: Van Clines M.D.   On: 05/09/2015 17:14   Ct Cervical Spine Wo Contrast  05/09/2015  CLINICAL DATA:  Recent trip and fall with headaches and neck pain, initial encounter EXAM: CT HEAD WITHOUT CONTRAST CT CERVICAL SPINE WITHOUT CONTRAST TECHNIQUE: Multidetector CT imaging of the head and cervical spine was performed following the standard protocol without intravenous contrast. Multiplanar CT image reconstructions of the cervical spine were also generated. COMPARISON:  None. FINDINGS: CT HEAD FINDINGS Bony calvarium is intact. Opacification of the left maxillary antrum is noted likely related chronic sinusitis. Diffuse atrophic changes and chronic white matter ischemic change is seen. No findings to suggest acute hemorrhage, acute infarction or space-occupying mass lesion are noted. CT CERVICAL SPINE FINDINGS Seven cervical segments are well visualized. Vertebral body height is well maintained. Diffuse facet hypertrophic changes are noted. No acute fracture or acute facet abnormality is noted in the cervical spine. In the right scapula, there is a displaced fracture similar to that seen on the prior  shoulder films. This is best visualized on the sagittal reconstructions. No soft tissue abnormality is noted. IMPRESSION: CT of the head: Chronic atrophic and ischemic changes without acute abnormality. CT of the cervical spine:  Multilevel degenerative changes. Minimally displaced right scapular fracture. Electronically Signed   By: Inez Catalina M.D.   On: 05/09/2015 15:48   Dg Humerus Right  05/09/2015  CLINICAL DATA:  Recent fall with right shoulder and arm pain. EXAM: RIGHT HUMERUS - 2+ VIEW COMPARISON:  None. FINDINGS: Humerus is well visualized and demonstrates no acute fracture or dislocation. There is again visualized fracture in the scapula similar to that seen on the shoulder films. IMPRESSION: Right scapular fracture.  No humeral fracture is noted. Electronically Signed   By: Inez Catalina M.D.   On: 05/09/2015 15:40    Scheduled Meds:  Scheduled Meds: . acetaminophen  1,000 mg Oral TID  . calcium carbonate  1,250 mg Oral BID WC  . carvedilol  12.5 mg Oral Q breakfast  . carvedilol  25 mg Oral QPM  . cholecalciferol  2,000 Units Oral Daily  . enoxaparin (LOVENOX) injection  30 mg Subcutaneous Q24H  . ferrous sulfate  325 mg Oral BID WC  . insulin aspart  0-5 Units Subcutaneous QHS  . insulin aspart  0-9 Units Subcutaneous TID WC  . lip balm  1 application Topical BID  . magnesium oxide  400 mg Oral Daily  . multivitamin with minerals  1 tablet Oral Daily  . polyethylene glycol  17 g Oral BID  . simvastatin  20 mg Oral QPM  . sodium chloride  3 mL Intravenous Q12H   Continuous Infusions:    Time spent on care of this patient: 35 min   [place], DO  05/11/2015, 8:13 AM  LOS: 2 days   [phone]  If 7PM-7AM, please contact night-coverage [email or web address]

## 2015-05-11 NOTE — Progress Notes (Signed)
Transferred via wheelchair and o2 at 5 liters to room 1434.

## 2015-05-11 NOTE — Progress Notes (Signed)
Physical Therapy Treatment Patient Details Name: [name] MRN: [id number] DOB: 12-22-1925 Today's Date: 05/11/2015    History of Present Illness [name] is a 81 y.o. female with a history of DM2, HTN and a remote history of Breast Cancer who presents to the ED 05/09/15  with complaints of falling , hitting her head and right arm on the stove.Found to have  displaced and minimally comminuted right scapular fractures of 8,9,10 ribs on the  right. She was also found to have new atrial fibrillation. 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    PT Comments    Patient declines need for SNF. Patient will  Require 1:1 during mobility for balance. Patient reports having access to [facility] which will be  Helpful in house. Recommend HHPT, aide.  Follow Up Recommendations  SNF;Supervision/Assistance - 24 hour;Home health PT (patient declines SNF)     Equipment Recommendations  None recommended by PT (patient declines   hospitl bed, she has a WC somewhere and will need to get it.)    Recommendations for Other Services       Precautions / Restrictions Precautions Precautions: Fall Precaution Comments: monitor  sats and HR Required Braces or Orthoses: Sling    Mobility  Bed Mobility Overal bed mobility: Needs Assistance Bed Mobility: Supine to Sit;Sit to Supine     Supine to sit: Mod assist;HOB elevated Sit to supine: Mod assist   General bed mobility comments: extra time to  and assist for trunk to upright, assist with legs and trunk back to bed.  Transfers Overall transfer level: Needs assistance Equipment used: 1 person hand held assist Transfers: Sit to/from [facility] Sit to Stand: Mod assist Stand pivot transfers: Min assist       General transfer comment: BSC, extra time to turn  to pivot to the  Left to [facility],  Ambulation/Gait Ambulation/Gait assistance: Mod assist;+2 safety/equipment Ambulation Distance (Feet): 50  Feet Assistive device: Quad cane Gait Pattern/deviations: Step-to pattern;Decreased step length - right;Decreased step length - left;Decreased weight shift to right Gait velocity: slow   General Gait Details: gait is very unsteady , trying to balance with a Quad cane. external balance support , gait is very slow.   Stairs            Wheelchair Mobility    Modified Rankin (Stroke Patients Only)       Balance           Standing balance support: Single extremity supported;During functional activity Standing balance-Leahy Scale: Poor                      Cognition Arousal/Alertness: Awake/alert Behavior During Therapy: WFL for tasks assessed/performed                        Exercises      General Comments        Pertinent Vitals/Pain Pain Assessment: Faces Faces Pain Scale: Hurts even more Pain Location: back Pain Descriptors / Indicators: Aching;Discomfort Pain Intervention(s): Repositioned;Monitored during session;Patient requesting pain meds-RN notified    Home Living                      Prior Function            PT Goals (current goals can now be found in the care plan section) Progress towards PT goals: Progressing toward goals    Frequency  Min 3X/week    PT Plan  Current plan remains appropriate    Co-evaluation             End of Session Equipment Utilized During Treatment: Gait belt;Oxygen Activity Tolerance: Patient limited by fatigue;Patient limited by pain Patient left: in bed;with call bell/phone within reach;with bed alarm set;with family/visitor present     Time: 1459-1520 PT Time Calculation (min) (ACUTE ONLY): 21 min  Charges:  $Gait Training: 8-22 mins                    G Codes:      Claretha Cooper 05/11/2015, 3:42 PM

## 2015-05-12 ENCOUNTER — Inpatient Hospital Stay (HOSPITAL_COMMUNITY): Payer: Medicare Other

## 2015-05-12 DIAGNOSIS — W19XXXA Unspecified fall, initial encounter: Secondary | ICD-10-CM

## 2015-05-12 DIAGNOSIS — S2231XA Fracture of one rib, right side, initial encounter for closed fracture: Secondary | ICD-10-CM

## 2015-05-12 DIAGNOSIS — S42101A Fracture of unspecified part of scapula, right shoulder, initial encounter for closed fracture: Secondary | ICD-10-CM

## 2015-05-12 LAB — BASIC METABOLIC PANEL
Anion gap: 8 (ref 5–15)
BUN: 58 mg/dL — AB (ref 6–20)
CALCIUM: 9.2 mg/dL (ref 8.9–10.3)
CO2: 21 mmol/L — AB (ref 22–32)
CREATININE: 2.06 mg/dL — AB (ref 0.44–1.00)
Chloride: 107 mmol/L (ref 101–111)
GFR calc Af Amer: 24 mL/min — ABNORMAL LOW (ref >60)
GFR calc non Af Amer: 21 mL/min — ABNORMAL LOW (ref >60)
GLUCOSE: 159 mg/dL — AB (ref 65–99)
Potassium: 4.1 mmol/L (ref 3.5–5.1)
Sodium: 136 mmol/L (ref 135–145)

## 2015-05-12 LAB — GLUCOSE, CAPILLARY
GLUCOSE-CAPILLARY: 122 mg/dL — AB (ref 65–99)
GLUCOSE-CAPILLARY: 169 mg/dL — AB (ref 65–99)
Glucose-Capillary: 145 mg/dL — ABNORMAL HIGH (ref 65–99)
Glucose-Capillary: 143 mg/dL — ABNORMAL HIGH (ref 65–99)

## 2015-05-12 MED ORDER — ASPIRIN 325 MG PO TABS
325.0000 mg | ORAL_TABLET | Freq: Every day | ORAL | Status: DC
  Administered 2015-05-12 – 2015-05-13 (×2): 325 mg via ORAL
  Filled 2015-05-12 (×2): qty 1

## 2015-05-12 NOTE — Progress Notes (Signed)
[facility] Progress Note   [name]  [id number]  DOB: 1926-02-09  DOA: 05/09/2015 PCP: Salena Saner., MD  Brief narrative: [name] is a 81 y.o. female with osteoporosis, diabetes, hypertension and history of breast cancer who presents after a fall where she hit her right chest and shoulder onto the stove. She is found to have a right scapular fracture and fractures of the right 8, 9 and 10 ribs all with minimal displacement. She is also found to have new onset atrial fibrillation.  Subjective: Not liking sitting still-- used to getting up and going more  Assessment/Plan:   Atrial fibrillation with rapid ventricular response - Cardizem infusion weaned off- - Rate controlled- Cont Coreg as she takes at home - ASA for now- outpatient cardiology follow up-- fall risk -patient refusing SNF-- says she has 24/7 caregiving at home - ECHO, TSH ok- last ECHO in 2007 revealed mild LV and RV failure -has seen [facility] cards- Ron Parker in past -This patients CHA2DS2-VASc Score and unadjusted Ischemic Stroke Rate (% per year) is equal to 3    Fall - PT eval- states she tripped- last fall was this summer due to tripping as well  Rib fractures - right #8, 9, 10  - IS - control pain with Tramadol -  watch for pneumonia   Right Scapula fracture - non displaced- follow- arm in sling - ortho eval requested by ER  AKI - last Cr in 01/2006 was 1.06 - received slow IV hydration overnight- will need to stop as CT reveals mild pulm edema  Metabolic acidosis - likely from acute renal failure - mild   right scapula fracture  -seen by ortho -"minimally displaced along with some rib fractures. Plan sling immobilization for 3 weeks followed by therapy assisted pendulums for total of 6 weeks. She'll need repeat radiographs in 10 days to make sure fracture displacement is not occurring in the meantime is okay for her to be out of sling for Baths and showers only otherwise remain in  the sling to facilitate healing she will need to come out of the sling at least once a day to straighten the arm to the elbow doesn't get too stiff"  Acute hypoxic resp failure (A)Pulm edema - noted on CT, elevated BNP, quite hypoxic but no resp distress- able to speak in full sentences -stop IVF - I/Ox still +: lasix x 2 doses -wean O2 as tolerated  (B) Splinting/ atelectasis - IS, OOB in chair, ambulate, PT ordered    Essential hypertension, benign - cont Coreg    Diabetes mellitus without complication  - Actos on hold- ISS ordered  Anemia -cont Ferrous Sulfate    History of breast cancer   Patient refusing SNF- wants to go home-- spoke with daughter  Code Status:     Code Status Orders        Start     Ordered   05/09/15 2137  Full code   Continuous     05/09/15 2136      Disposition Plan: home once off O2 DVT prophylaxis: SCDs, Lovenox Consultants: trauma, ortho Procedures:   Antibiotics: Anti-infectives    None      Objective: Filed Weights   05/09/15 2200  Weight: 76.6 kg (168 lb 14 oz)    Intake/Output Summary (Last 24 hours) at 05/12/15 1210 Last data filed at 05/12/15 0900  Gross per 24 hour  Intake   1200 ml  Output    745 ml  Net  455 ml     Vitals Filed Vitals:   05/11/15 1453 05/11/15 2213 05/12/15 0230 05/12/15 0620  BP: 150/78 112/59 138/69 138/55  Pulse: 82 83 86 88  Temp: 98 F (36.7 C) 97.5 F (36.4 C) 97.8 F (36.6 C) 97.8 F (36.6 C)  TempSrc: Oral Oral Oral Oral  Resp: 20 18 18 18   Height:      Weight:      SpO2: 91% 90% 92% 93%    Exam:  General:  Pt is alert, not in acute distress  Cardiovascular: irregular  Respiratory: diminished, no wheezing  Abdomen: Soft, +Bowel sounds, non tender, non distended, no guarding  MSK: No LE edema, cyanosis or clubbing- right arm in sling  Data Reviewed: Basic Metabolic Panel:  Recent Labs Lab 05/09/15 1815 05/10/15 0110 05/11/15 0340 05/12/15 0857  NA 139  139 138 136  K 4.2 4.5 3.8 4.1  CL 110 110 111 107  CO2 18* 20* 20* 21*  GLUCOSE 170* 214* 145* 159*  BUN 50* 49* 49* 58*  CREATININE 2.10* 2.03* 1.94* 2.06*  CALCIUM 9.2 8.9 8.8* 9.2   Liver Function Tests:  Recent Labs Lab 05/09/15 1815  AST 19  ALT 13*  ALKPHOS 56  BILITOT 0.8  PROT 6.5  ALBUMIN 3.9   No results for input(s): LIPASE, AMYLASE in the last 168 hours. No results for input(s): AMMONIA in the last 168 hours. CBC:  Recent Labs Lab 05/09/15 1815 05/10/15 0110 05/11/15 0340  WBC 13.7* 10.9* 10.9*  NEUTROABS 11.7*  --   --   HGB 11.7* 11.5* 10.8*  HCT 33.2* 33.1* 32.0*  MCV 84.5 87.1 86.3  PLT 239 244 196   Cardiac Enzymes:  Recent Labs Lab 05/10/15 0110 05/10/15 1235  CKTOTAL  --  119  TROPONINI <0.03  --    BNP (last 3 results)  Recent Labs  05/09/15 1815 05/11/15 0340  BNP 587.2* 592.4*    ProBNP (last 3 results) No results for input(s): PROBNP in the last 8760 hours.  CBG:  Recent Labs Lab 05/11/15 0757 05/11/15 1157 05/11/15 1654 05/11/15 2211 05/12/15 0746  GLUCAP 145* 187* 128* 143* 145*    Recent Results (from the past 240 hour(s))  MRSA PCR Screening     Status: None   Collection Time: 05/09/15  9:37 PM  Result Value Ref Range Status   MRSA by PCR NEGATIVE NEGATIVE Final    Comment:        The GeneXpert MRSA Assay (FDA approved for NASAL specimens only), is one component of a comprehensive MRSA colonization surveillance program. It is not intended to diagnose MRSA infection nor to guide or monitor treatment for MRSA infections.      Studies: Dg Chest 2 View  05/12/2015  CLINICAL DATA:  Hypoxia, weakness EXAM: CHEST  2 VIEW COMPARISON:  CT chest dated 05/09/2015 FINDINGS: Patchy left lower lobe opacity, likely atelectasis. Mild right basilar opacity, likely atelectasis. No definite pleural effusion. No pneumothorax. Cardiomegaly. Multiple old left rib fracture deformities. Degenerative changes of the  bilateral shoulders. Known inferior right scapular fracture, better evaluated on CT. IMPRESSION: Patchy left lower lobe opacity, likely atelectasis. Right basilar atelectasis. Right inferior scapular fracture, better evaluated on CT. No pneumothorax. Electronically Signed   By: Julian Hy M.D.   On: 05/12/2015 09:07    Scheduled Meds:  Scheduled Meds: . acetaminophen  1,000 mg Oral TID  . calcium carbonate  1,250 mg Oral BID WC  . carvedilol  12.5 mg Oral Q  breakfast  . carvedilol  25 mg Oral QPM  . cholecalciferol  2,000 Units Oral Daily  . enoxaparin (LOVENOX) injection  30 mg Subcutaneous Q24H  . ferrous sulfate  325 mg Oral BID WC  . insulin aspart  0-5 Units Subcutaneous QHS  . insulin aspart  0-9 Units Subcutaneous TID WC  . lip balm  1 application Topical BID  . magnesium oxide  400 mg Oral Daily  . multivitamin with minerals  1 tablet Oral Daily  . polyethylene glycol  17 g Oral BID  . simvastatin  20 mg Oral QPM  . sodium chloride  3 mL Intravenous Q12H   Continuous Infusions:    Time spent on care of this patient: 25 min   [place], DO  05/12/2015, 12:10 PM  LOS: 3 days   [phone]  If 7PM-7AM, please contact night-coverage [email or web address]

## 2015-05-12 NOTE — Progress Notes (Signed)
Physical Therapy Treatment Patient Details Name: [name] MRN: [id number] DOB: [birth year] Today's Date: 05/12/2015    History of Present Illness [name] is a 81 y.o. female with a history of DM2, HTN and a remote history of Breast Cancer who presents to the ED 05/09/15  with complaints of falling , hitting her head and right arm on the stove.Found to have  displaced and minimally comminuted right scapular fractures of 8,9,10 ribs on the  right. She was also found to have new atrial fibrillation. 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    PT Comments    Gait continues to be unsteady with quad cane, requires  Mod assist for stability during ambulation. Patient is HIGH fall risk . She will need a person walking with her at all times. PT recommends a WC at home, patient reports that she has one but not at her house. Patient reports that her family is available. Granddaughter had left before PT was able to speak to her about patient safety. Patient's oxygen saturation down to 82%  at rest in bed. While on 6 liters, sats 85% while mobilizing, HR up to 115. RN aware.  Follow Up Recommendations  Home health PT;SNF;Supervision/Assistance - 24 hour (patient declines SNF again)     Equipment Recommendations  Wheelchair (measurements PT) (reports having one but not at her house. Encouraged patient to get family to bring.)    Recommendations for Other Services       Precautions / Restrictions Precautions Precautions: Fall Precaution Comments: monitor  sats and HR Required Braces or Orthoses: Sling Restrictions Weight Bearing Restrictions: Yes    Mobility  Bed Mobility Overal bed mobility: Needs Assistance Bed Mobility: Supine to Sit;Sit to Supine     Supine to sit: Mod assist;HOB elevated     General bed mobility comments: VC for pt to breathe with supine to sit  Transfers Overall transfer level: Needs assistance Equipment used: Quad  cane Transfers: Sit to/from Stand Sit to Stand: Min assist Stand pivot transfers: Min assist       General transfer comment: VC for safety and sequencing  Ambulation/Gait Ambulation/Gait assistance: Mod assist;+2 safety/equipment Ambulation Distance (Feet): 20 Feet (then 7' on 6 l oxygen) Assistive device: Quad cane Gait Pattern/deviations: Step-to pattern;Staggering left;Staggering right;Shuffle;Decreased step length - right;Decreased step length - left     General Gait Details: gait is very unsteady , trying to balance with a Quad cane. external balance support , gait is very slow.   Stairs            Wheelchair Mobility    Modified Rankin (Stroke Patients Only)       Balance Overall balance assessment: Needs assistance         Standing balance support: During functional activity;Single extremity supported Standing balance-Leahy Scale: Poor                      Cognition Arousal/Alertness: Awake/alert Behavior During Therapy: WFL for tasks assessed/performed Overall Cognitive Status: Within Functional Limits for tasks assessed                      Exercises      General Comments        Pertinent Vitals/Pain Pain Assessment: 0-10 Faces Pain Scale: Hurts a little bit Pain Location: side Pain Descriptors / Indicators: Aching;Guarding;Discomfort Pain Intervention(s): Monitored during session;Premedicated before session;Repositioned    Home Living Family/patient expects to be discharged to:: Private residence   Available  Help at Discharge: Family Type of Home: House Home Access: Stairs to enter   Home Layout: One level Home Equipment: Environmental consultant - 2 wheels;Cane - single point;Bedside commode;Wheelchair - manual      Prior Function Level of Independence: Independent with assistive device(s)          PT Goals (current goals can now be found in the care plan section) Acute Rehab PT Goals Patient Stated Goal: to go home Progress  towards PT goals: Progressing toward goals    Frequency  Min 3X/week    PT Plan Current plan remains appropriate    Co-evaluation PT/OT/SLP Co-Evaluation/Treatment: Yes Reason for Co-Treatment: For patient/therapist safety PT goals addressed during session: Mobility/safety with mobility       End of Session Equipment Utilized During Treatment: Gait belt;Oxygen Activity Tolerance: Patient limited by fatigue;Patient limited by pain Patient left: in chair;with call bell/phone within reach;with chair alarm set     Time: 1157-1230 PT Time Calculation (min) (ACUTE ONLY): 33 min  Charges:  $Gait Training: 8-22 mins                    G Codes:      Claretha Cooper 05/12/2015, 12:55 PM Tresa Endo PT [phone]

## 2015-05-12 NOTE — Progress Notes (Signed)
Resumed care of patient. Agree with previous assessment. Will continue to monitor. 

## 2015-05-12 NOTE — Evaluation (Signed)
Occupational Therapy Evaluation Patient Details Name: [name] MRN: [id number] DOB: 02-09-1926 Today's Date: 05/12/2015    History of Present Illness [name] is a 81 y.o. female with a history of DM2, HTN and a remote history of Breast Cancer who presents to the ED 05/09/15  with complaints of falling , hitting her head and right arm on the stove.Found to have  displaced and minimally comminuted right scapular fractures of 8,9,10 ribs on the  right. She was also found to have new atrial fibrillation. 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   Clinical Impression   Pt admitted with s/p fall. Pt currently with functional limitations due to the deficits listed below (see OT Problem List).  Pt will benefit from skilled OT to increase their safety and independence with ADL and functional mobility for ADL to facilitate discharge to venue listed below     Follow Up Recommendations  Supervision/Assistance - 24 hour;Home health OT    Equipment Recommendations  None recommended by OT       Precautions / Restrictions Precautions Precautions: Fall Precaution Comments: monitor  sats and HR Required Braces or Orthoses: Sling Restrictions Weight Bearing Restrictions: Yes      Mobility Bed Mobility Overal bed mobility: Needs Assistance Bed Mobility: Supine to Sit;Sit to Supine     Supine to sit: Mod assist;HOB elevated     General bed mobility comments: VC for pt to breathe with supine to sit  Transfers Overall transfer level: Needs assistance Equipment used: Quad cane Transfers: Sit to/from [facility] Sit to Stand: Min assist Stand pivot transfers: Min assist       General transfer comment: VC for safety and sequencing         ADL Overall ADL's : Needs assistance/impaired             Lower Body Bathing: Maximal assistance;Sit to/from stand   Upper Body Dressing : Moderate assistance;Sitting   Lower Body  Dressing: Maximal assistance;Sit to/from stand   Toilet Transfer: Minimal assistance;Regular Toilet;Comfort height toilet;Ambulation   Toileting- Clothing Manipulation and Hygiene: Minimal assistance;Sit to/from stand;Cueing for sequencing;Cueing for safety;Cueing for compensatory techniques         General ADL Comments: pt will need 24/7 A at home for fall prevention and safety!     Vision            Pertinent Vitals/Pain Pain Assessment: 0-10 Faces Pain Scale: Hurts a little bit Pain Location: back Pain Intervention(s): Monitored during session;Repositioned     Hand Dominance Right   Extremity/Trunk Assessment Upper Extremity Assessment RUE Deficits / Details: in sling       Cervical / Trunk Assessment Cervical / Trunk Assessment: Kyphotic   Communication Communication Communication: No difficulties   Cognition Arousal/Alertness: Awake/alert Behavior During Therapy: WFL for tasks assessed/performed Overall Cognitive Status: Within Functional Limits for tasks assessed                                Home Living Family/patient expects to be discharged to:: Private residence   Available Help at Discharge: Family Type of Home: House Home Access: Stairs to enter Technical brewer of Steps: 1   Home Layout: One level     Bathroom Shower/Tub: Occupational psychologist: Standard     Home Equipment: Environmental consultant - 2 wheels;Cane - single point;Bedside commode;Wheelchair - manual          Prior Functioning/Environment Level of  Independence: Independent with assistive device(s)             OT Diagnosis: Generalized weakness   OT Problem List: Decreased strength;Decreased activity tolerance;Decreased safety awareness   OT Treatment/Interventions: Self-care/ADL training;DME and/or AE instruction;Patient/family education    OT Goals(Current goals can be found in the care plan section) Acute Rehab OT Goals Patient Stated Goal: to go  home OT Goal Formulation: With patient Time For Goal Achievement: 05/26/15 ADL Goals Pt Will Perform Grooming: with supervision;standing Pt Will Perform Upper Body Dressing: with min guard assist;sitting Pt Will Perform Lower Body Dressing: with min guard assist;sit to/from stand;with caregiver independent in assisting Pt Will Transfer to Toilet: with supervision;regular height toilet;ambulating Pt Will Perform Toileting - Clothing Manipulation and hygiene: with supervision;sit to/from stand  OT Frequency: Min 2X/week              End of Session Nurse Communication: Mobility status  Activity Tolerance: Patient tolerated treatment well Patient left: in chair;with call bell/phone within reach;with chair alarm set   Time: 1212-1230 OT Time Calculation (min): 18 min Charges:  OT General Charges $OT Visit: 1 Procedure OT Evaluation $Initial OT Evaluation Tier I: 1 Procedure G-Codes:    [name] Jun 06, 2015, 12:45 PM

## 2015-05-12 NOTE — Progress Notes (Addendum)
05/12/15 SATURATION QUALIFICATIONS: (This note is used to comply with regulatory documentation for home oxygen)  Patient Saturations on Room Air at Rest = 82%  Patient Saturations on Room Air while Ambulating = did not test due to low sats on RA. Patient Saturations on 6 Liters of oxygen while Ambulating =85% %  Please briefly explain why patient needs home oxygen:oxygen desaturation on RA. Tresa Endo PT [phone]

## 2015-05-12 NOTE — Care Management Important Message (Signed)
Important Message  Patient Details  Name: Antanette Conry MRN: [id number] Date of Birth: 11-17-1925   Medicare Important Message Given:  Yes    Camillo Flaming 05/12/2015, 12:27 [place] Message  Patient Details  Name: Andreas Evangelista MRN: [id number] Date of Birth: 1926/02/16   Medicare Important Message Given:  Yes    Camillo Flaming 05/12/2015, 12:27 PM

## 2015-05-13 LAB — CBC
HCT: 29.7 % — ABNORMAL LOW (ref 36.0–46.0)
Hemoglobin: 10.3 g/dL — ABNORMAL LOW (ref 12.0–15.0)
MCH: 30.3 pg (ref 26.0–34.0)
MCHC: 34.7 g/dL (ref 30.0–36.0)
MCV: 87.4 fL (ref 78.0–100.0)
PLATELETS: 228 10*3/uL (ref 150–400)
RBC: 3.4 MIL/uL — AB (ref 3.87–5.11)
RDW: 13.8 % (ref 11.5–15.5)
WBC: 7.2 10*3/uL (ref 4.0–10.5)

## 2015-05-13 LAB — BASIC METABOLIC PANEL
Anion gap: 9 (ref 5–15)
BUN: 59 mg/dL — AB (ref 6–20)
CALCIUM: 9 mg/dL (ref 8.9–10.3)
CO2: 22 mmol/L (ref 22–32)
CREATININE: 2.08 mg/dL — AB (ref 0.44–1.00)
Chloride: 109 mmol/L (ref 101–111)
GFR calc Af Amer: 24 mL/min — ABNORMAL LOW (ref >60)
GFR, EST NON AFRICAN AMERICAN: 20 mL/min — AB (ref >60)
Glucose, Bld: 128 mg/dL — ABNORMAL HIGH (ref 65–99)
POTASSIUM: 4.3 mmol/L (ref 3.5–5.1)
SODIUM: 140 mmol/L (ref 135–145)

## 2015-05-13 LAB — GLUCOSE, CAPILLARY
Glucose-Capillary: 136 mg/dL — ABNORMAL HIGH (ref 65–99)
Glucose-Capillary: 157 mg/dL — ABNORMAL HIGH (ref 65–99)

## 2015-05-13 MED ORDER — ASPIRIN 325 MG PO TABS: 325.0000 mg | ORAL_TABLET | Freq: Every day | ORAL | Status: DC

## 2015-05-13 MED ORDER — POLYETHYLENE GLYCOL 3350 17 G PO PACK: 17.0000 g | PACK | Freq: Two times a day (BID) | ORAL | Status: DC

## 2015-05-13 MED ORDER — BISACODYL 10 MG RE SUPP
10.0000 mg | Freq: Two times a day (BID) | RECTAL | Status: DC | PRN
Start: 2015-05-13 — End: 2015-08-24

## 2015-05-13 MED ORDER — TRAMADOL HCL 50 MG PO TABS: 50.0000 mg | ORAL_TABLET | Freq: Two times a day (BID) | ORAL | Status: DC | PRN

## 2015-05-13 MED ORDER — ACETAMINOPHEN 500 MG PO TABS: 1000.0000 mg | ORAL_TABLET | Freq: Three times a day (TID) | ORAL | Status: DC

## 2015-05-13 NOTE — Discharge Summary (Signed)
Physician Discharge Summary  [name] ZOX:[id number] DOB: [birth year] DOA: 05/09/2015  PCP: Alva Garnet., MD  Admit date: 05/09/2015 Discharge date: 05/13/2015  Time spent: 35 minutes  Recommendations for Outpatient Follow-up:  1. Outpatient follow up with ortho for fracture 2. Home O2 3. Home health 4. BMP 5. Outpatient cardiology follow up   Discharge Diagnoses:  Principal Problem:   Atrial fibrillation with rapid ventricular response (HCC) Active Problems:    Rib fractures - right #8-10   Scapula fracture   Fall   Essential hypertension, benign   Diabetes mellitus without complication (HCC)   History of breast cancer   AKI (acute kidney injury) (HCC)   Discharge Condition: improved  Diet recommendation: diabetic  Filed Weights   05/09/15 2200  Weight: 76.6 kg (168 lb 14 oz)    History of present illness:  [name] is a 81 y.o. female with a history of DM2, HTN and a remote history of Breast Cancer who presents to the ED with complaints of falling as she walked from her front porch and tripped on the threshold hitting her head and right arm on the stove. She was evaluated in the ED and found to have fractures of the right scapula and multiple right ribs. She was also found to have new atrial fibrillation. She was referred for admission. The EDP consulted Orthopedics to see the patient.   Hospital Course:  Atrial fibrillation with rapid ventricular response - Cardizem infusion weaned off- - Rate controlled- Cont Coreg as she takes at home - ASA for now- outpatient cardiology follow up-- fall risk -patient refusing SNF-- says she has 24/7 caregiving at home - ECHO, TSH ok- last ECHO in 2007 revealed mild LV and RV failure -has seen [facility] cards- [name] in past -This patients CHA2DS2-VASc Score and unadjusted Ischemic Stroke Rate (% per year) is equal to 3   Fall - PT eval- states she tripped- last fall was this summer due to tripping as  well  Rib fractures - right #8, 9, 10  - IS - control pain with Tramadol- bowel regimen while on pain meds - watch for pneumonia - incentive spirometry  Right Scapula fracture - non displaced- follow- arm in sling - ortho eval requested by ER  AKI appears to be CKD- most likely from DM - last Cr in 01/2006 was 1.06  Metabolic acidosis - likely from acute renal failure - mild   right scapula fracture  -seen by ortho -"minimally displaced along with some rib fractures. Plan sling immobilization for 3 weeks followed by therapy assisted pendulums for total of 6 weeks. She'll need repeat radiographs in 10 days to make sure fracture displacement is not occurring in the meantime is okay for her to be out of sling for Baths and showers only otherwise remain in the sling to facilitate healing she will need to come out of the sling at least once a day to straighten the arm to the elbow doesn't get too stiff"  Acute hypoxic resp failure (A)Pulm edema - noted on CT, elevated BNP, quite hypoxic but no resp distress- able to speak in full sentences -stop IVF - I/Ox still +: lasix x 2 doses -wean O2 as tolerated  (B) Splinting/ atelectasis - IS, OOB in chair, ambulate, PT ordered   Essential hypertension, benign - cont Coreg   Diabetes mellitus without complication  - hold actos while Cr up- strict diabetic diet  Anemia -cont Ferrous Sulfate   History of breast cancer   Patient refusing  SNF- wants to go home-- spoke with daughter  Procedures:    Consultations:  Ortho  trauma  Discharge Exam: Filed Vitals:   05/13/15 0705 05/13/15 0817  BP: 138/68   Pulse: 89 80  Temp: 97.7 F (36.5 C)   Resp: 18       Discharge Instructions   Discharge Instructions    Diet Carb Modified    Complete by:  As directed      Discharge instructions    Complete by:  As directed   24 hour supervision by family Home health- PT/OT sling immobilization for 3 weeks followed by  therapy assisted pendulums for total of 6 weeks. She'll need repeat radiographs in 10 days to make sure fracture displacement is not occurring in the meantime is okay for her to be out of sling for Baths and showers only otherwise remain in the sling to facilitate healing she will need to come out of the sling at least once a day to straighten the arm to the elbow doesn't get too stiff" BMP 1 week with PCP  Bowel regimen while on pain meds     Increase activity slowly    Complete by:  As directed           Current Discharge Medication List    START taking these medications   Details  acetaminophen (TYLENOL) 500 MG tablet Take 2 tablets (1,000 mg total) by mouth 3 (three) times daily. Qty: 30 tablet, Refills: 0    aspirin 325 MG tablet Take 1 tablet (325 mg total) by mouth daily. Qty: 30 tablet    bisacodyl (DULCOLAX) 10 MG suppository Place 1 suppository (10 mg total) rectally every 12 (twelve) hours as needed for mild constipation or moderate constipation. Qty: 12 suppository, Refills: 0    polyethylene glycol (MIRALAX / GLYCOLAX) packet Take 17 g by mouth 2 (two) times daily. Qty: 14 each, Refills: 0    traMADol (ULTRAM) 50 MG tablet Take 1-2 tablets (50-100 mg total) by mouth every 12 (twelve) hours as needed for moderate pain or severe pain. Qty: 45 tablet, Refills: 0      CONTINUE these medications which have NOT CHANGED   Details  calcium carbonate (OS-CAL) 600 MG TABS Take 600 mg by mouth 2 (two) times daily with a meal.    !! carvedilol (COREG) 12.5 MG tablet Take 12.5 mg by mouth daily with breakfast.     !! carvedilol (COREG) 25 MG tablet Take 25 mg by mouth every evening.    cholecalciferol (VITAMIN D) 1000 UNITS tablet Take 2,000 Units by mouth daily.    ferrous sulfate 325 (65 FE) MG tablet Take 325 mg by mouth 2 (two) times daily with a meal.     LUTEIN PO Take 1 capsule by mouth daily.    MAGNESIUM-ZINC PO Take 1 tablet by mouth daily.    Multiple Vitamin  (MULTIVITAMIN WITH MINERALS) TABS Take 1 tablet by mouth daily.    simvastatin (ZOCOR) 20 MG tablet Take 20 mg by mouth every evening.     !! - Potential duplicate medications found. Please discuss with provider.    STOP taking these medications     diphenhydramine-acetaminophen (TYLENOL PM) 25-500 MG TABS tablet      pioglitazone (ACTOS) 30 MG tablet        Allergies  Allergen Reactions  . Codeine     "talks out of her head"  . Oxycodone Other (See Comments)    confusion   Follow-up Information  Schedule an appointment as soon as possible for a visit with Kendra Copa, MD.   Specialty:  Orthopedic Surgery   Contact information:   [address] [place] [address] [phone]        The results of significant diagnostics from this hospitalization (including imaging, microbiology, ancillary and laboratory) are listed below for reference.    Significant Diagnostic Studies: Dg Chest 1 View  05/09/2015  CLINICAL DATA:  Pain following fall.  History of breast carcinoma EXAM: CHEST 1 VIEW COMPARISON:  January 28, 2013 FINDINGS: There is mild generalized interstitial prominence. No frank edema or consolidation. Heart is enlarged with pulmonary vascularity within normal limits. There old healed rib fractures bilaterally. No acute fracture is evident. There is arthropathy in both shoulders and in the thoracic spine. No pneumothorax. No adenopathy. IMPRESSION: Mild generalized interstitial prominence without frank edema or consolidation. Suspect mild underlying inflammatory type change/ fibrotic change. Stable cardiac enlargement. Old fractures bilaterally without acute fracture evident. Extensive arthropathy in both shoulders. No pneumothorax. Electronically Signed   By: Bretta Bang III M.D.   On: 05/09/2015 15:39   Dg Chest 2 View  05/12/2015  CLINICAL DATA:  Hypoxia, weakness EXAM: CHEST  2 VIEW COMPARISON:  CT chest dated 05/09/2015 FINDINGS: Patchy left  lower lobe opacity, likely atelectasis. Mild right basilar opacity, likely atelectasis. No definite pleural effusion. No pneumothorax. Cardiomegaly. Multiple old left rib fracture deformities. Degenerative changes of the bilateral shoulders. Known inferior right scapular fracture, better evaluated on CT. IMPRESSION: Patchy left lower lobe opacity, likely atelectasis. Right basilar atelectasis. Right inferior scapular fracture, better evaluated on CT. No pneumothorax. Electronically Signed   By: Charline Bills M.D.   On: 05/12/2015 09:07   Dg Shoulder Right  05/09/2015  CLINICAL DATA:  Fall today with right shoulder pain, initial encounter EXAM: RIGHT SHOULDER - 2+ VIEW COMPARISON:  None. FINDINGS: Mild degenerative changes of the acromioclavicular joint are seen. No dislocation of the humeral head is noted. Just central to the glenoid there is a mildly displaced fracture in the scapula. No underlying rib abnormality is noted. IMPRESSION: Mildly displaced scapular fracture. Electronically Signed   By: Alcide Clever M.D.   On: 05/09/2015 15:38   Ct Head Wo Contrast  05/09/2015  CLINICAL DATA:  Recent trip and fall with headaches and neck pain, initial encounter EXAM: CT HEAD WITHOUT CONTRAST CT CERVICAL SPINE WITHOUT CONTRAST TECHNIQUE: Multidetector CT imaging of the head and cervical spine was performed following the standard protocol without intravenous contrast. Multiplanar CT image reconstructions of the cervical spine were also generated. COMPARISON:  None. FINDINGS: CT HEAD FINDINGS Bony calvarium is intact. Opacification of the left maxillary antrum is noted likely related chronic sinusitis. Diffuse atrophic changes and chronic white matter ischemic change is seen. No findings to suggest acute hemorrhage, acute infarction or space-occupying mass lesion are noted. CT CERVICAL SPINE FINDINGS Seven cervical segments are well visualized. Vertebral body height is well maintained. Diffuse facet  hypertrophic changes are noted. No acute fracture or acute facet abnormality is noted in the cervical spine. In the right scapula, there is a displaced fracture similar to that seen on the prior shoulder films. This is best visualized on the sagittal reconstructions. No soft tissue abnormality is noted. IMPRESSION: CT of the head: Chronic atrophic and ischemic changes without acute abnormality. CT of the cervical spine:  Multilevel degenerative changes. Minimally displaced right scapular fracture. Electronically Signed   By: Alcide Clever M.D.   On: 05/09/2015 15:48  Ct Chest Wo Contrast  05/09/2015  CLINICAL DATA:  Fall in home. Right posterior flank pain. Right arm pain. EXAM: CT CHEST WITHOUT CONTRAST TECHNIQUE: Multidetector CT imaging of the chest was performed following the standard protocol without IV contrast. COMPARISON:  05/09/2015 chest radiograph FINDINGS: Mediastinum/Nodes: Coronary, aortic arch, and branch vessel atherosclerotic vascular disease. Tortuous brachiocephalic artery. Moderate cardiomegaly. Small pericardial effusion. No pathologic thoracic adenopathy. Lungs/Pleura: Abnormal secondary pulmonary lobular interstitial accentuation favoring interstitial pulmonary edema. Mild airway thickening. Mild atelectasis in both lower lobes. Hazy dependent density in the upper lobes could worsened select early airspace edema. Upper abdomen: Unremarkable Musculoskeletal: Mildly displaced and minimally comminuted right scapular fracture. Today' s exam was not optimized to assess this fracture, but it does appear to extend into the inferior glenoid articular surface on images 12-13 of series 5, and extend along the blade of the scapula inferiorly to the inferior scapular tip. There is severe degenerative arthropathy of both glenohumeral joints. There is some old healed right posterolateral rib fractures, and also acute nondisplaced rib fractures of the right eighth, ninth, and tenth ribs posteriorly. On  the left side there numerous rib deformities compatible with old fractures but I do not see any new left rib fractures. Thoracic kyphosis and spondylosis. Suspected remote superior endplate compressions at T9 and T10. IMPRESSION: 1. Mildly displaced and minimally comminuted right scapular fracture extends from the inferior glenoid to the inferior scapular tip. 2. Acute nondisplaced right eighth, ninth, and tenth rib fractures posteriorly. Old bilateral healed rib fractures are also present. 3. Remote superior endplate compression fractures at T9 and T10. 4. Moderate enlargement of the cardiopericardial silhouette with interstitial pulmonary edema and a small pericardial effusion. No pleural effusion. 5. Coronary, aortic arch, and branch vessel atherosclerotic vascular disease. 6. Mild atelectasis in both lower lobes. 7. Bilateral degenerative glenohumeral arthropathy. Electronically Signed   By: Gaylyn Rong M.D.   On: 05/09/2015 17:14   Ct Cervical Spine Wo Contrast  05/09/2015  CLINICAL DATA:  Recent trip and fall with headaches and neck pain, initial encounter EXAM: CT HEAD WITHOUT CONTRAST CT CERVICAL SPINE WITHOUT CONTRAST TECHNIQUE: Multidetector CT imaging of the head and cervical spine was performed following the standard protocol without intravenous contrast. Multiplanar CT image reconstructions of the cervical spine were also generated. COMPARISON:  None. FINDINGS: CT HEAD FINDINGS Bony calvarium is intact. Opacification of the left maxillary antrum is noted likely related chronic sinusitis. Diffuse atrophic changes and chronic white matter ischemic change is seen. No findings to suggest acute hemorrhage, acute infarction or space-occupying mass lesion are noted. CT CERVICAL SPINE FINDINGS Seven cervical segments are well visualized. Vertebral body height is well maintained. Diffuse facet hypertrophic changes are noted. No acute fracture or acute facet abnormality is noted in the cervical spine.  In the right scapula, there is a displaced fracture similar to that seen on the prior shoulder films. This is best visualized on the sagittal reconstructions. No soft tissue abnormality is noted. IMPRESSION: CT of the head: Chronic atrophic and ischemic changes without acute abnormality. CT of the cervical spine:  Multilevel degenerative changes. Minimally displaced right scapular fracture. Electronically Signed   By: Alcide Clever M.D.   On: 05/09/2015 15:48   Dg Humerus Right  05/09/2015  CLINICAL DATA:  Recent fall with right shoulder and arm pain. EXAM: RIGHT HUMERUS - 2+ VIEW COMPARISON:  None. FINDINGS: Humerus is well visualized and demonstrates no acute fracture or dislocation. There is again visualized fracture in the scapula similar to  that seen on the shoulder films. IMPRESSION: Right scapular fracture.  No humeral fracture is noted. Electronically Signed   By: Alcide Clever M.D.   On: 05/09/2015 15:40    Microbiology: Recent Results (from the past 240 hour(s))  MRSA PCR Screening     Status: None   Collection Time: 05/09/15  9:37 PM  Result Value Ref Range Status   MRSA by PCR NEGATIVE NEGATIVE Final    Comment:        The GeneXpert MRSA Assay (FDA approved for NASAL specimens only), is one component of a comprehensive MRSA colonization surveillance program. It is not intended to diagnose MRSA infection nor to guide or monitor treatment for MRSA infections.      Labs: Basic Metabolic Panel:  Recent Labs Lab 05/09/15 1815 05/10/15 0110 05/11/15 0340 05/12/15 0857 05/13/15 0450  NA 139 139 138 136 140  K 4.2 4.5 3.8 4.1 4.3  CL 110 110 111 107 109  CO2 18* 20* 20* 21* 22  GLUCOSE 170* 214* 145* 159* 128*  BUN 50* 49* 49* 58* 59*  CREATININE 2.10* 2.03* 1.94* 2.06* 2.08*  CALCIUM 9.2 8.9 8.8* 9.2 9.0   Liver Function Tests:  Recent Labs Lab 05/09/15 1815  AST 19  ALT 13*  ALKPHOS 56  BILITOT 0.8  PROT 6.5  ALBUMIN 3.9   No results for input(s): LIPASE,  AMYLASE in the last 168 hours. No results for input(s): AMMONIA in the last 168 hours. CBC:  Recent Labs Lab 05/09/15 1815 05/10/15 0110 05/11/15 0340 05/13/15 0450  WBC 13.7* 10.9* 10.9* 7.2  NEUTROABS 11.7*  --   --   --   HGB 11.7* 11.5* 10.8* 10.3*  HCT 33.2* 33.1* 32.0* 29.7*  MCV 84.5 87.1 86.3 87.4  PLT 239 244 196 228   Cardiac Enzymes:  Recent Labs Lab 05/10/15 0110 05/10/15 1235  CKTOTAL  --  119  TROPONINI <0.03  --    BNP: BNP (last 3 results)  Recent Labs  05/09/15 1815 05/11/15 0340  BNP 587.2* 592.4*    ProBNP (last 3 results) No results for input(s): PROBNP in the last 8760 hours.  CBG:  Recent Labs Lab 05/12/15 0746 05/12/15 1210 05/12/15 1648 05/12/15 2141 05/13/15 0713  GLUCAP 145* 143* 122* 169* 136*       Signed:  Yesika [name] UPCHURCH Lamin Chandley  [facility] 05/13/2015, 12:07 PM

## 2015-05-13 NOTE — Progress Notes (Signed)
Pt selected [facility] for [facility] needs.

## 2015-05-13 NOTE — Progress Notes (Signed)
Reviewed discharge information with patient and caregiver. Answered all questions. Patient/caregiver able to teach back medications and reasons to contact MD/911. Patient verbalizes importance of PCP follow up appointment.

## 2015-05-13 NOTE — Care Management Note (Signed)
Case Management Note  Patient Details  Name: [name] MRN: [id number] Date of Birth: Apr 07, 1926  Subjective/Objective:                    Action/Plan:   Expected Discharge Date:                  Expected Discharge Plan:  Home/Self Care  In-House Referral:  NA  Discharge planning Services  CM Consult  Post Acute Care Choice:    Choice offered to:     DME Arranged:    DME Agency:     HH Arranged:  RN, PT, OT [place] Agency:  [place]  Status of Service:  In process, will continue to follow  Medicare Important Message Given:  Yes Date Medicare IM Given:    Medicare IM give by:    Date Additional Medicare IM Given:    Additional Medicare Important Message give by:     If discussed at [place] of Stay Meetings, dates discussed:    Additional CommentsPurcell Mouton, RN 05/13/2015, 12:06 PM

## 2015-05-13 NOTE — Progress Notes (Signed)
Occupational Therapy Treatment Patient Details Name: [name] MRN: [id number] DOB: [birth year] Today's Date: 05/13/2015    History of present illness [name] is a 81 y.o. female with a history of DM2, HTN and a remote history of Breast Cancer who presents to the ED 05/09/15  with complaints of falling , hitting her head and right arm on the stove.Found to have  displaced and minimally comminuted right scapular fractures of 8,9,10 ribs on the  right. She was also found to have new atrial fibrillation. 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   OT comments  Pt did better this day and verbalizes understanding that family must A  Her at all times  Follow Up Recommendations  Supervision/Assistance - 24 hour;Home health OT    Equipment Recommendations  None recommended by OT    Recommendations for Other Services      Precautions / Restrictions Precautions Precautions: Fall Precaution Comments: monitor  sats and HR Required Braces or Orthoses: Sling Restrictions Weight Bearing Restrictions: No       Mobility Bed Mobility Overal bed mobility: Needs Assistance         Sit to supine: Mod assist      Transfers Overall transfer level: Needs assistance Equipment used: 1 person hand held assist Transfers: Sit to/from [employer] Sit to Stand: Min assist Stand pivot transfers: Min assist       General transfer comment: VC for safety and sequencing    Balance                                   ADL       Grooming: Set up;Sitting   Upper Body Bathing: Moderate assistance;Sitting       Upper Body Dressing : Moderate assistance;Sitting   Lower Body Dressing: Moderate assistance;Sit to/from stand   Toilet Transfer: Minimal assistance;BSC;Cueing for sequencing;Cueing for safety;Stand-pivot   Toileting- Clothing Manipulation and Hygiene: Minimal assistance;Sit to/from stand;Cueing for  sequencing;Cueing for safety;Cueing for compensatory techniques Toileting - Clothing Manipulation Details (indicate cue type and reason): using L hand       General ADL Comments: pt will need 24/7 A at home for fall prevention and safety!                Cognition   Behavior During Therapy: WFL for tasks assessed/performed Overall Cognitive Status: Within Functional Limits for tasks assessed                       Extremity/Trunk Assessment                          Pertinent Vitals/ Pain       Pain Score: 3  Pain Location: ribs Pain Descriptors / Indicators: Sore Pain Intervention(s): Monitored during session;Patient requesting pain meds-RN notified     Prior Functioning/Environment              Frequency Min 2X/week     Progress Toward Goals  OT Goals(current goals can now be found in the care plan section)  Progress towards OT goals: Progressing toward goals     Plan Discharge plan remains appropriate       End of Session     Activity Tolerance Patient tolerated treatment well   Patient Left in chair;with call bell/phone within reach;with chair alarm set   Nurse Communication Mobility status  Time: 0935-1000 OT Time Calculation (min): 25 min  Charges: OT General Charges $OT Visit: 1 Procedure OT Treatments $Self Care/Home Management : 23-37 mins  Jerimy Johanson, [place] 05/13/2015, 11:18 AM

## 2015-06-01 ENCOUNTER — Ambulatory Visit: Payer: Medicare Other | Admitting: Podiatry

## 2015-06-30 ENCOUNTER — Ambulatory Visit (INDEPENDENT_AMBULATORY_CARE_PROVIDER_SITE_OTHER): Payer: Medicare Other | Admitting: Podiatry

## 2015-06-30 ENCOUNTER — Encounter: Payer: Self-pay | Admitting: Podiatry

## 2015-06-30 DIAGNOSIS — Q828 Other specified congenital malformations of skin: Secondary | ICD-10-CM

## 2015-06-30 DIAGNOSIS — M79676 Pain in unspecified toe(s): Secondary | ICD-10-CM

## 2015-06-30 DIAGNOSIS — B351 Tinea unguium: Secondary | ICD-10-CM | POA: Diagnosis not present

## 2015-06-30 DIAGNOSIS — E1140 Type 2 diabetes mellitus with diabetic neuropathy, unspecified: Secondary | ICD-10-CM

## 2015-06-30 NOTE — Patient Instructions (Signed)
Diabetes and Foot Care Diabetes may cause you to have problems because of poor blood supply (circulation) to your feet and legs. This may cause the skin on your feet to become thinner, break easier, and heal more slowly. Your skin may become dry, and the skin may peel and crack. You may also have nerve damage in your legs and feet causing decreased feeling in them. You may not notice minor injuries to your feet that could lead to infections or more serious problems. Taking care of your feet is one of the most important things you can do for yourself.  HOME CARE INSTRUCTIONS  Wear shoes at all times, even in the house. Do not go barefoot. Bare feet are easily injured.  Check your feet daily for blisters, cuts, and redness. If you cannot see the bottom of your feet, use a mirror or ask someone for help.  Wash your feet with warm water (do not use hot water) and mild soap. Then pat your feet and the areas between your toes until they are completely dry. Do not soak your feet as this can dry your skin.  Apply a moisturizing lotion or petroleum jelly (that does not contain alcohol and is unscented) to the skin on your feet and to dry, brittle toenails. Do not apply lotion between your toes.  Trim your toenails straight across. Do not dig under them or around the cuticle. File the edges of your nails with an emery board or nail file.  Do not cut corns or calluses or try to remove them with medicine.  Wear clean socks or stockings every day. Make sure they are not too tight. Do not wear knee-high stockings since they may decrease blood flow to your legs.  Wear shoes that fit properly and have enough cushioning. To break in new shoes, wear them for just a few hours a day. This prevents you from injuring your feet. Always look in your shoes before you put them on to be sure there are no objects inside.  Do not cross your legs. This may decrease the blood flow to your feet.  If you find a minor scrape,  cut, or break in the skin on your feet, keep it and the skin around it clean and dry. These areas may be cleansed with mild soap and water. Do not cleanse the area with peroxide, alcohol, or iodine.  When you remove an adhesive bandage, be sure not to damage the skin around it.  If you have a wound, look at it several times a day to make sure it is healing.  Do not use heating pads or hot water bottles. They may burn your skin. If you have lost feeling in your feet or legs, you may not know it is happening until it is too late.  Make sure your health care provider performs a complete foot exam at least annually or more often if you have foot problems. Report any cuts, sores, or bruises to your health care provider immediately. SEEK MEDICAL CARE IF:   You have an injury that is not healing.  You have cuts or breaks in the skin.  You have an ingrown nail.  You notice redness on your legs or feet.  You feel burning or tingling in your legs or feet.  You have pain or cramps in your legs and feet.  Your legs or feet are numb.  Your feet always feel cold. SEEK IMMEDIATE MEDICAL CARE IF:   There is increasing redness,   swelling, or pain in or around a wound.  There is a red line that goes up your leg.  Pus is coming from a wound.  You develop a fever or as directed by your health care provider.  You notice a bad smell coming from an ulcer or wound.   This information is not intended to replace advice given to you by your health care provider. Make sure you discuss any questions you have with your health care provider.   Document Released: 05/12/2000 Document Revised: 01/15/2013 Document Reviewed: 10/22/2012 Elsevier Interactive Patient Education 2016 [employer]vier Inc.  

## 2015-06-30 NOTE — Progress Notes (Signed)
Patient ID: [name], female   DOB: [birth year], 82 y.o.   MRN: [id number]  Subjective: This patient presents again with daughter present in the treatment room complaining of thickened and elongated toenails are cough or tingling shoes and requests toenail debridement. Also, patient complaining of uncomfortable callouses right and left feet. Patient recovering from fall since the visit of 02/23/2015 a history of shoulder fracture and rib fracture  Objective: Orientated 3 No open skin lesions bilaterally The toenails are elongated, brittle, discolored, deformed, tender to palpation 6-10 Keratoses hallux bilaterally plantar right first MPJ  Assessment: Diabetic with a history neuropathy and angiopathy Symptomatic onychomycoses 6-10 Keratoses 3  Plan: Debridement toenails 6-10 mechanically and electronically without a bleeding Debrided keratoses 3 without any bleeding  Reappoint 3 months

## 2015-07-29 ENCOUNTER — Ambulatory Visit
Admission: RE | Admit: 2015-07-29 | Discharge: 2015-07-29 | Disposition: A | Discharge status: Home / Self Care | Payer: Medicare Other | Source: Ambulatory Visit | Attending: Internal Medicine | Admitting: Internal Medicine

## 2015-07-29 ENCOUNTER — Other Ambulatory Visit: Payer: Self-pay | Admitting: Internal Medicine

## 2015-07-29 DIAGNOSIS — R0602 Shortness of breath: Secondary | ICD-10-CM

## 2015-08-02 ENCOUNTER — Inpatient Hospital Stay (HOSPITAL_COMMUNITY)
Admission: EM | Admit: 2015-08-02 | Discharge: 2015-08-06 | DRG: 291 | Disposition: A | Discharge status: Home / Self Care | Payer: Medicare Other | Attending: Internal Medicine | Admitting: Internal Medicine

## 2015-08-02 ENCOUNTER — Encounter (HOSPITAL_COMMUNITY): Payer: Self-pay | Admitting: Family Medicine

## 2015-08-02 ENCOUNTER — Emergency Department (HOSPITAL_COMMUNITY): Payer: Medicare Other

## 2015-08-02 DIAGNOSIS — E872 Acidosis: Secondary | ICD-10-CM | POA: Diagnosis not present

## 2015-08-02 DIAGNOSIS — Z8249 Family history of ischemic heart disease and other diseases of the circulatory system: Secondary | ICD-10-CM | POA: Diagnosis not present

## 2015-08-02 DIAGNOSIS — R0600 Dyspnea, unspecified: Secondary | ICD-10-CM | POA: Diagnosis present

## 2015-08-02 DIAGNOSIS — I509 Heart failure, unspecified: Secondary | ICD-10-CM | POA: Insufficient documentation

## 2015-08-02 DIAGNOSIS — I5043 Acute on chronic combined systolic (congestive) and diastolic (congestive) heart failure: Secondary | ICD-10-CM | POA: Diagnosis present

## 2015-08-02 DIAGNOSIS — Z823 Family history of stroke: Secondary | ICD-10-CM

## 2015-08-02 DIAGNOSIS — Z9181 History of falling: Secondary | ICD-10-CM

## 2015-08-02 DIAGNOSIS — E119 Type 2 diabetes mellitus without complications: Secondary | ICD-10-CM

## 2015-08-02 DIAGNOSIS — I4891 Unspecified atrial fibrillation: Secondary | ICD-10-CM | POA: Diagnosis not present

## 2015-08-02 DIAGNOSIS — E785 Hyperlipidemia, unspecified: Secondary | ICD-10-CM | POA: Diagnosis present

## 2015-08-02 DIAGNOSIS — I480 Paroxysmal atrial fibrillation: Secondary | ICD-10-CM | POA: Diagnosis present

## 2015-08-02 DIAGNOSIS — Z7982 Long term (current) use of aspirin: Secondary | ICD-10-CM | POA: Diagnosis not present

## 2015-08-02 DIAGNOSIS — N179 Acute kidney failure, unspecified: Secondary | ICD-10-CM | POA: Diagnosis present

## 2015-08-02 DIAGNOSIS — J810 Acute pulmonary edema: Secondary | ICD-10-CM

## 2015-08-02 DIAGNOSIS — E1122 Type 2 diabetes mellitus with diabetic chronic kidney disease: Secondary | ICD-10-CM | POA: Diagnosis present

## 2015-08-02 DIAGNOSIS — M1990 Unspecified osteoarthritis, unspecified site: Secondary | ICD-10-CM | POA: Diagnosis present

## 2015-08-02 DIAGNOSIS — J9601 Acute respiratory failure with hypoxia: Secondary | ICD-10-CM | POA: Diagnosis present

## 2015-08-02 DIAGNOSIS — I429 Cardiomyopathy, unspecified: Secondary | ICD-10-CM | POA: Diagnosis present

## 2015-08-02 DIAGNOSIS — I5023 Acute on chronic systolic (congestive) heart failure: Secondary | ICD-10-CM | POA: Diagnosis not present

## 2015-08-02 DIAGNOSIS — Z9981 Dependence on supplemental oxygen: Secondary | ICD-10-CM | POA: Diagnosis not present

## 2015-08-02 DIAGNOSIS — R0602 Shortness of breath: Secondary | ICD-10-CM | POA: Diagnosis present

## 2015-08-02 DIAGNOSIS — I5033 Acute on chronic diastolic (congestive) heart failure: Secondary | ICD-10-CM

## 2015-08-02 DIAGNOSIS — Z853 Personal history of malignant neoplasm of breast: Secondary | ICD-10-CM

## 2015-08-02 DIAGNOSIS — Z66 Do not resuscitate: Secondary | ICD-10-CM | POA: Diagnosis present

## 2015-08-02 DIAGNOSIS — I340 Nonrheumatic mitral (valve) insufficiency: Secondary | ICD-10-CM | POA: Diagnosis present

## 2015-08-02 DIAGNOSIS — I130 Hypertensive heart and chronic kidney disease with heart failure and stage 1 through stage 4 chronic kidney disease, or unspecified chronic kidney disease: Principal | ICD-10-CM | POA: Diagnosis present

## 2015-08-02 DIAGNOSIS — N184 Chronic kidney disease, stage 4 (severe): Secondary | ICD-10-CM | POA: Diagnosis present

## 2015-08-02 DIAGNOSIS — R7989 Other specified abnormal findings of blood chemistry: Secondary | ICD-10-CM

## 2015-08-02 DIAGNOSIS — I10 Essential (primary) hypertension: Secondary | ICD-10-CM | POA: Diagnosis present

## 2015-08-02 HISTORY — DX: Heart failure, unspecified: I50.9

## 2015-08-02 HISTORY — DX: Paroxysmal atrial fibrillation: I48.0

## 2015-08-02 HISTORY — DX: Other specified abnormal findings of blood chemistry: R79.89

## 2015-08-02 HISTORY — DX: Hyperlipidemia, unspecified: E78.5

## 2015-08-02 HISTORY — DX: Dyspnea, unspecified: R06.00

## 2015-08-02 LAB — URINE MICROSCOPIC-ADD ON: RBC / HPF: NONE SEEN RBC/hpf (ref 0–5)

## 2015-08-02 LAB — CBC
HCT: 38.2 % (ref 36.0–46.0)
HEMOGLOBIN: 12.7 g/dL (ref 12.0–15.0)
MCH: 29.5 pg (ref 26.0–34.0)
MCHC: 33.2 g/dL (ref 30.0–36.0)
MCV: 88.8 fL (ref 78.0–100.0)
PLATELETS: 253 10*3/uL (ref 150–400)
RBC: 4.3 MIL/uL (ref 3.87–5.11)
RDW: 15.1 % (ref 11.5–15.5)
WBC: 12.4 10*3/uL — ABNORMAL HIGH (ref 4.0–10.5)

## 2015-08-02 LAB — BASIC METABOLIC PANEL
ANION GAP: 14 (ref 5–15)
BUN: 36 mg/dL — ABNORMAL HIGH (ref 6–20)
CALCIUM: 9.9 mg/dL (ref 8.9–10.3)
CO2: 16 mmol/L — ABNORMAL LOW (ref 22–32)
Chloride: 112 mmol/L — ABNORMAL HIGH (ref 101–111)
Creatinine, Ser: 1.77 mg/dL — ABNORMAL HIGH (ref 0.44–1.00)
GFR, EST AFRICAN AMERICAN: 29 mL/min — AB (ref >60)
GFR, EST NON AFRICAN AMERICAN: 25 mL/min — AB (ref >60)
Glucose, Bld: 179 mg/dL — ABNORMAL HIGH (ref 65–99)
Potassium: 4.8 mmol/L (ref 3.5–5.1)
Sodium: 142 mmol/L (ref 135–145)

## 2015-08-02 LAB — URINALYSIS, ROUTINE W REFLEX MICROSCOPIC
Bilirubin Urine: NEGATIVE
GLUCOSE, UA: NEGATIVE mg/dL
HGB URINE DIPSTICK: NEGATIVE
Ketones, ur: NEGATIVE mg/dL
LEUKOCYTES UA: NEGATIVE
Nitrite: NEGATIVE
PH: 5 (ref 5.0–8.0)
Protein, ur: 100 mg/dL — AB
SPECIFIC GRAVITY, URINE: 1.013 (ref 1.005–1.030)

## 2015-08-02 LAB — LACTIC ACID, PLASMA
LACTIC ACID, VENOUS: 1.1 mmol/L (ref 0.5–2.0)
Lactic Acid, Venous: 1.6 mmol/L (ref 0.5–2.0)

## 2015-08-02 LAB — GLUCOSE, CAPILLARY: GLUCOSE-CAPILLARY: 146 mg/dL — AB (ref 65–99)

## 2015-08-02 LAB — I-STAT CG4 LACTIC ACID, ED
Lactic Acid, Venous: 2.42 mmol/L (ref 0.5–2.0)
Lactic Acid, Venous: 0.68 mmol/L (ref 0.5–2.0)

## 2015-08-02 LAB — I-STAT TROPONIN, ED
TROPONIN I, POC: 0.01 ng/mL (ref 0.00–0.08)
TROPONIN I, POC: 0.02 ng/mL (ref 0.00–0.08)

## 2015-08-02 LAB — BRAIN NATRIURETIC PEPTIDE: B NATRIURETIC PEPTIDE 5: 983.8 pg/mL — AB (ref 0.0–100.0)

## 2015-08-02 MED ORDER — FUROSEMIDE 10 MG/ML IJ SOLN
20.0000 mg | Freq: Once | INTRAMUSCULAR | Status: AC
  Administered 2015-08-02: 20 mg via INTRAVENOUS
  Filled 2015-08-02: qty 2

## 2015-08-02 MED ORDER — METOPROLOL TARTRATE 1 MG/ML IV SOLN
5.0000 mg | Freq: Once | INTRAVENOUS | Status: AC
  Administered 2015-08-02: 5 mg via INTRAVENOUS
  Filled 2015-08-02: qty 5

## 2015-08-02 MED ORDER — FERROUS SULFATE 325 (65 FE) MG PO TABS
325.0000 mg | ORAL_TABLET | Freq: Two times a day (BID) | ORAL | Status: DC
  Administered 2015-08-02 – 2015-08-04 (×5): 325 mg via ORAL
  Filled 2015-08-02 (×5): qty 1

## 2015-08-02 MED ORDER — ONDANSETRON HCL 4 MG/2ML IJ SOLN: 4.0000 mg | Freq: Four times a day (QID) | INTRAMUSCULAR | Status: DC | PRN

## 2015-08-02 MED ORDER — ACETAMINOPHEN 325 MG PO TABS
650.0000 mg | ORAL_TABLET | ORAL | Status: DC | PRN
  Administered 2015-08-02 – 2015-08-05 (×6): 650 mg via ORAL
  Filled 2015-08-02 (×6): qty 2

## 2015-08-02 MED ORDER — ENOXAPARIN SODIUM 30 MG/0.3ML ~~LOC~~ SOLN
30.0000 mg | SUBCUTANEOUS | Status: DC
  Administered 2015-08-02 – 2015-08-04 (×3): 30 mg via SUBCUTANEOUS
  Filled 2015-08-02 (×3): qty 0.3

## 2015-08-02 MED ORDER — CALCIUM CARBONATE 1250 (500 CA) MG PO TABS
1.0000 | ORAL_TABLET | Freq: Two times a day (BID) | ORAL | Status: DC
  Administered 2015-08-02 – 2015-08-04 (×5): 500 mg via ORAL
  Filled 2015-08-02 (×5): qty 1

## 2015-08-02 MED ORDER — SODIUM CHLORIDE 0.9% FLUSH
3.0000 mL | Freq: Two times a day (BID) | INTRAVENOUS | Status: DC
  Administered 2015-08-02 – 2015-08-06 (×9): 3 mL via INTRAVENOUS

## 2015-08-02 MED ORDER — CARVEDILOL 25 MG PO TABS
25.0000 mg | ORAL_TABLET | Freq: Every evening | ORAL | Status: DC
Start: 2015-08-02 — End: 2015-08-03
  Administered 2015-08-02: 25 mg via ORAL
  Filled 2015-08-02: qty 1

## 2015-08-02 MED ORDER — CARVEDILOL 12.5 MG PO TABS
12.5000 mg | ORAL_TABLET | Freq: Every day | ORAL | Status: DC
  Administered 2015-08-03: 12.5 mg via ORAL
  Filled 2015-08-02: qty 1

## 2015-08-02 MED ORDER — SIMVASTATIN 20 MG PO TABS
20.0000 mg | ORAL_TABLET | Freq: Every evening | ORAL | Status: DC
  Administered 2015-08-02 – 2015-08-05 (×4): 20 mg via ORAL
  Filled 2015-08-02 (×4): qty 1

## 2015-08-02 MED ORDER — FUROSEMIDE 10 MG/ML IJ SOLN
20.0000 mg | Freq: Two times a day (BID) | INTRAMUSCULAR | Status: DC
  Administered 2015-08-02 – 2015-08-03 (×2): 20 mg via INTRAVENOUS
  Filled 2015-08-02 (×2): qty 2

## 2015-08-02 MED ORDER — INSULIN ASPART 100 UNIT/ML ~~LOC~~ SOLN
0.0000 [IU] | Freq: Three times a day (TID) | SUBCUTANEOUS | Status: DC
  Administered 2015-08-02: 2 [IU] via SUBCUTANEOUS
  Administered 2015-08-03 – 2015-08-04 (×3): 1 [IU] via SUBCUTANEOUS

## 2015-08-02 MED ORDER — ARTIFICIAL TEARS 0.1-0.3 % OP SOLN: 1.0000 [drp] | Freq: Three times a day (TID) | OPHTHALMIC | Status: DC

## 2015-08-02 MED ORDER — POLYETHYLENE GLYCOL 3350 17 G PO PACK
17.0000 g | PACK | Freq: Two times a day (BID) | ORAL | Status: DC
  Administered 2015-08-03 – 2015-08-05 (×2): 17 g via ORAL
  Filled 2015-08-02 (×6): qty 1

## 2015-08-02 MED ORDER — SODIUM CHLORIDE 0.9 % IV SOLN: 250.0000 mL | INTRAVENOUS | Status: DC | PRN

## 2015-08-02 MED ORDER — SODIUM CHLORIDE 0.9% FLUSH: 3.0000 mL | INTRAVENOUS | Status: DC | PRN

## 2015-08-02 MED ORDER — POLYVINYL ALCOHOL 1.4 % OP SOLN
1.0000 [drp] | Freq: Three times a day (TID) | OPHTHALMIC | Status: DC
  Administered 2015-08-02 – 2015-08-06 (×12): 1 [drp] via OPHTHALMIC
  Filled 2015-08-02: qty 15

## 2015-08-02 NOTE — ED Notes (Signed)
Pt here for severe SOB. sts yesterday she was having some nausea and hyperventilating. sts HR was 120. sts some chest pain. Pt labored breathing. Pt on home o2 3l.

## 2015-08-02 NOTE — ED Provider Notes (Signed)
CSN: [id number]     Arrival date & time 08/02/15  0919 History   First MD Initiated Contact with Patient 08/02/15 [phone]     Chief Complaint  Patient presents with  . Shortness of Breath     (Consider location/radiation/quality/duration/timing/severity/associated sxs/prior Treatment) Patient is a 82 y.o. female presenting with shortness of breath.  Shortness of Breath Severity:  Severe Onset quality:  Sudden Duration:  2 hours Timing:  Constant Progression:  Worsening Chronicity:  New Associated symptoms: PND   Associated symptoms: no abdominal pain, no chest pain, no cough (2 weeks ago, abx for pneumonia, improved, no coughing since then), no fever, no headaches, no neck pain, no rash, no sore throat, no sputum production, no syncope and no vomiting   Risk factors: no hx of PE/DVT, no prolonged immobilization, no recent surgery and no tobacco use     Past Medical History  Diagnosis Date  . Diabetes mellitus without complication ([place])   . Hypertension   . Cancer [facility])     breast cancer   Past Surgical History  Procedure Laterality Date  . Abdominal hysterectomy    . Joint replacement      knees  . Fracture surgery      L femur  . Breast lumpectomy     Family History  Problem Relation Age of Onset  . Coronary artery disease Father   . Stroke Mother    Social History  Substance Use Topics  . Smoking status: Never Smoker   . Smokeless tobacco: None  . Alcohol Use: No   OB History    No data available     Review of Systems  Constitutional: Negative for fever.  HENT: Negative for sore throat.   Eyes: Negative for visual disturbance.  Respiratory: Positive for shortness of breath. Negative for cough (2 weeks ago, abx for pneumonia, improved, no coughing since then) and sputum production.   Cardiovascular: Positive for PND. Negative for chest pain and syncope.  Gastrointestinal: Positive for nausea. Negative for vomiting, abdominal pain, diarrhea and constipation.   Genitourinary: Negative for difficulty urinating.  Musculoskeletal: Negative for back pain and neck pain.  Skin: Negative for rash.  Neurological: Negative for syncope and headaches.      Allergies  Codeine and Oxycodone  Home Medications   Prior to Admission medications   Medication Sig Start Date End Date Taking? Authorizing Provider  acetaminophen (TYLENOL) 500 MG tablet Take 2 tablets (1,000 mg total) by mouth 3 (three) times daily. 05/13/15  Yes Jessica U Vann, DO  ARTIFICIAL TEARS 0.1-0.3 % SOLN Apply 1 drop to eye 3 (three) times daily.   Yes Historical Provider, MD  aspirin 325 MG tablet Take 1 tablet (325 mg total) by mouth daily. 05/13/15  Yes Geradine Girt, DO  calcium carbonate (OS-CAL) 600 MG TABS Take 600 mg by mouth 2 (two) times daily with a meal.   Yes Historical Provider, MD  carvedilol (COREG) 12.5 MG tablet Take 12.5 mg by mouth daily with breakfast.    Yes Historical Provider, MD  carvedilol (COREG) 25 MG tablet Take 25 mg by mouth every evening.   Yes Historical Provider, MD  cholecalciferol (VITAMIN D) 1000 UNITS tablet Take 2,000 Units by mouth daily.   Yes Historical Provider, MD  ferrous sulfate 325 (65 FE) MG tablet Take 325 mg by mouth 2 (two) times daily with a meal.    Yes Historical Provider, MD  LUTEIN PO Take 1 capsule by mouth daily.   Yes Historical  Provider, MD  MAGNESIUM-ZINC PO Take 1 tablet by mouth daily.   Yes Historical Provider, MD  Multiple Vitamin (MULTIVITAMIN WITH MINERALS) TABS Take 1 tablet by mouth daily.   Yes Historical Provider, MD  polyethylene glycol (MIRALAX / GLYCOLAX) packet Take 17 g by mouth 2 (two) times daily. 05/13/15  Yes Geradine Girt, DO  simvastatin (ZOCOR) 20 MG tablet Take 20 mg by mouth every evening.   Yes Historical Provider, MD  bisacodyl (DULCOLAX) 10 MG suppository Place 1 suppository (10 mg total) rectally every 12 (twelve) hours as needed for mild constipation or moderate constipation. 05/13/15   Jessica U  Vann, DO   BP 159/72 mmHg  Pulse 115  Temp(Src) 98.5 F (36.9 C) (Oral)  Resp 22  Wt 161 lb 6.4 oz (73.211 kg)  SpO2 92% Physical Exam  Constitutional: She is oriented to person, place, and time. She appears well-developed and well-nourished. No distress.  HENT:  Head: Normocephalic and atraumatic.  Eyes: Conjunctivae and EOM are normal.  Neck: Normal range of motion.  Cardiovascular: Normal heart sounds and intact distal pulses.  An irregularly irregular rhythm present. Tachycardia present.  Exam reveals no gallop and no friction rub.   No murmur heard. Pulmonary/Chest: Effort normal. No respiratory distress. She has no wheezes. She has rales (diffuse).  Abdominal: Soft. She exhibits no distension. There is no tenderness. There is no guarding.  Musculoskeletal: She exhibits no edema or tenderness.  Neurological: She is alert and oriented to person, place, and time.  Skin: Skin is warm and dry. No rash noted. She is not diaphoretic. No erythema.  Nursing note and vitals reviewed.   ED Course  Procedures (including critical care time) Labs Review Labs Reviewed  BASIC METABOLIC PANEL - Abnormal; Notable for the following:    Chloride 112 (*)    CO2 16 (*)    Glucose, Bld 179 (*)    BUN 36 (*)    Creatinine, Ser 1.77 (*)    GFR calc non Af Amer 25 (*)    GFR calc Af Amer 29 (*)    All other components within normal limits  CBC - Abnormal; Notable for the following:    WBC 12.4 (*)    All other components within normal limits  BRAIN NATRIURETIC PEPTIDE - Abnormal; Notable for the following:    B Natriuretic Peptide 983.8 (*)    All other components within normal limits  URINALYSIS, ROUTINE W REFLEX MICROSCOPIC (NOT AT [facility]) - Abnormal; Notable for the following:    Protein, ur 100 (*)    All other components within normal limits  URINE MICROSCOPIC-ADD ON - Abnormal; Notable for the following:    Squamous Epithelial / LPF 0-5 (*)    Bacteria, UA FEW (*)    All other  components within normal limits  I-STAT CG4 LACTIC ACID, ED - Abnormal; Notable for the following:    Lactic Acid, Venous 2.42 (*)    All other components within normal limits  URINE CULTURE  LACTIC ACID, PLASMA  HEMOGLOBIN A1C  LACTIC ACID, PLASMA  I-STAT TROPOININ, ED  I-STAT TROPOININ, ED  I-STAT TROPOININ, ED  I-STAT CG4 LACTIC ACID, ED  Randolm Idol, ED    Imaging Review Dg Chest Portable 1 View  08/02/2015  CLINICAL DATA:  Shortness of Breath EXAM: PORTABLE CHEST 1 VIEW COMPARISON:  07/29/2015 FINDINGS: Cardiac shadow remains enlarged. Increased vascular congestion with bilateral pleural effusions and bibasilar atelectatic changes is seen. No acute bony abnormality is noted. IMPRESSION: CHF  with associated effusions. Electronically Signed   By: Inez Catalina M.D.   On: 08/02/2015 10:42   I have personally reviewed and evaluated these images and lab results as part of my medical decision-making.   EKG Interpretation   Date/Time:  Monday August 02 2015 09:26:19 EST Ventricular Rate:  123 PR Interval:    QRS Duration: 134 QT Interval:  335 QTC Calculation: 479 R Axis:   35 Text Interpretation:  Atrial fibrillation with rapid ventricular response  Ventricular premature complex Left bundle branch block Baseline wander in  lead(s) V6 No significant change since last tracing Confirmed by  [facility] MD, Mckinley Adelstein ([id number]) on 08/02/2015 10:18:47 AM      MDM   Final diagnoses:  Acute on chronic congestive heart failure, unspecified congestive heart failure type (HCC)  Elevated lactic acid level  Atrial fibrillation, unspecified type [facility])   82 year old female with a history of diabetes, hypertension, remote breast cancer, admission to the hospital in December 2016 for rib fractures, pneumonia and found to have atrial fibrillation with RVR, who presents presents today with acute onset of dyspnea.   Differential diagnosis for dyspnea includes ACS, PE, CHF exacerbation, anemia,  pneumonia.  Patient in atrial fibrillation with RVR with a rate in the 130s and 140s on arrival to the emergency department, and exam is consistent with diffuse pulmonary edema and crackles. Concern is for flash pulmonary edema secondary to A. fib with RVR. Patient was given 5 mg of metoprolol with decrease in her heart rate to the 90s.  Chest x-ray was done which showed pulmonary edema. BNP was in the 900s.  Patient's lactic acid is elevated, white count is 12.3, however have low suspicion for pneumonia in this clinical setting, and feel that her history and physical exam are most consistent with flash pulmonary edema secondary to atrial fibrillation with RVR and congestive heart failure exacerbation. No recent cough, no fevers. Patient is Lasix nave and was given 20 mg of IV Lasix.  Patient initially requiring O2 via face mask, however was able to be weaned to nasal cannula after consult to admission with Hospitalist team. Given pulmonary edema on XR, hx of afib, feel PE is less likely cause of dyspnea than flash pulmonary edema at this time and inpt team to diuresis and monitor clinically.       Gareth Morgan, MD 08/02/15 [phone]

## 2015-08-02 NOTE — ED Notes (Signed)
EDP at the bedside.  ?

## 2015-08-02 NOTE — ED Notes (Signed)
Pt placed on [place] at this time at 4L to maintain O2 sats. Tolerating at this time. Will continue to monitor.

## 2015-08-02 NOTE — ED Notes (Signed)
Attempted to transition patient off NRB but sats dropped into the mid 80s. Placed back to NRB and sats increased. Pt remains on monitor at this time.

## 2015-08-02 NOTE — H&P (Signed)
[facility] History and Physical  [name] [id number] DOB: 04/19/1926 DOA: 08/02/2015  Referring physician: Emergency Department PCP: Salena Saner., MD   CHIEF COMPLAINT:  Shortness of breath                 HPI: [name] is a 82 y.o. female in the ED with severe shortness of breath and nausea. Patient had tightness in her chest with nausea yesterday. She had recurrent symptoms today. No coughing or fevers. No chest pain or palpitations. No dizziness.        Patient was hospitalized in December  after falling at home and fracturing her right scapula and multiple right ribs. During that admission patient was also found to have new atrial fibrillation. She was started on a Cardizem drip, eventually weaned off and prescribed aspirin until outpatient cardiology follow-up . [name] Patient and family state that patient has never been treated for heart failure. She takes Coreg for hypertension.   ED COURSE:           Labs:   BNP 903 Lactic acid 2.42 Trop 0.01  EKG:    Atrial fibrillation Left bundle branch block V rate 94                  Medications  furosemide (LASIX) injection 20 mg (not administered)  metoprolol (LOPRESSOR) injection 5 mg (5 mg Intravenous Given 08/02/15 0942)    Review of Systems  Constitutional: Negative.   HENT: Negative.   Eyes: Negative.   Respiratory: Positive for shortness of breath.   Cardiovascular: Negative.   Gastrointestinal: Negative.   Genitourinary: Negative.   Musculoskeletal: Negative.   Skin: Negative.   Neurological: Negative.   Endo/Heme/Allergies: Negative.   Psychiatric/Behavioral: Negative.     Past Medical History  Diagnosis Date  . Diabetes mellitus without complication ([place])   . Hypertension   . Cancer [facility])     breast cancer   Past Surgical History  Procedure Laterality Date  . Abdominal hysterectomy    . Joint replacement      knees  . Fracture surgery      L femur  . Breast lumpectomy       SOCIAL HISTORY:  reports that she has never smoked. She does not have any smokeless tobacco history on file. She reports that she does not drink alcohol or use illicit drugs. Lives:   At home with daughter and grandchildren    Assistive devices:   Walker needed for ambulation.   Allergies  Allergen Reactions  . Codeine     "talks out of her head"  . Oxycodone Other (See Comments)    confusion    [place]: father had heart disease. Mother - CVA  Prior to Admission medications   Medication Sig Start Date End Date Taking? Authorizing Provider  acetaminophen (TYLENOL) 500 MG tablet Take 2 tablets (1,000 mg total) by mouth 3 (three) times daily. 05/13/15  Yes Jessica U Vann, DO  ARTIFICIAL TEARS 0.1-0.3 % SOLN Apply 1 drop to eye 3 (three) times daily.   Yes Historical Provider, MD  aspirin 325 MG tablet Take 1 tablet (325 mg total) by mouth daily. 05/13/15  Yes Geradine Girt, DO  calcium carbonate (OS-CAL) 600 MG TABS Take 600 mg by mouth 2 (two) times daily with a meal.   Yes Historical Provider, MD  carvedilol (COREG) 12.5 MG tablet Take 12.5 mg by mouth daily with breakfast.    Yes Historical Provider, MD  carvedilol (COREG) 25 MG tablet  Take 25 mg by mouth every evening.   Yes Historical Provider, MD  cholecalciferol (VITAMIN D) 1000 UNITS tablet Take 2,000 Units by mouth daily.   Yes Historical Provider, MD  ferrous sulfate 325 (65 FE) MG tablet Take 325 mg by mouth 2 (two) times daily with a meal.    Yes Historical Provider, MD  LUTEIN PO Take 1 capsule by mouth daily.   Yes Historical Provider, MD  MAGNESIUM-ZINC PO Take 1 tablet by mouth daily.   Yes Historical Provider, MD  Multiple Vitamin (MULTIVITAMIN WITH MINERALS) TABS Take 1 tablet by mouth daily.   Yes Historical Provider, MD  polyethylene glycol (MIRALAX / GLYCOLAX) packet Take 17 g by mouth 2 (two) times daily. 05/13/15  Yes Geradine Girt, DO  simvastatin (ZOCOR) 20 MG tablet Take 20 mg by mouth every evening.   Yes  Historical Provider, MD  bisacodyl (DULCOLAX) 10 MG suppository Place 1 suppository (10 mg total) rectally every 12 (twelve) hours as needed for mild constipation or moderate constipation. 05/13/15   Geradine Girt, DO   PHYSICAL EXAM: Filed Vitals:   08/02/15 1024 08/02/15 1030 08/02/15 1100 08/02/15 1130  BP:  130/78 139/96 138/80  Pulse:  108 97 99  Temp: 98.2 F (36.8 C)     TempSrc: Oral     Resp:  23 23 29   SpO2:  97% 98% 98%    Wt Readings from Last 3 Encounters:  05/09/15 76.6 kg (168 lb 14 oz)    General:  Pleasant white  female. Appears calm and comfortable Eyes: PER, normal lids, irises & conjunctiva ENT: grossly normal hearing, lips & tongue Neck: no LAD, no masses Cardiovascular: Irregular irregular, 1+ BLE edema.  Respiratory: Respirations even and unlabored. On NR. Significantly decreased breath sounds in both lower lungs    Abdomen: soft, non-distended, non-tender, active bowel sounds. No obvious masses.  Skin: no rash seen on limited exam Musculoskeletal: grossly normal tone BUE/BLE Psychiatric: grossly normal mood and affect, speech fluent and appropriate Neurologic: grossly non-focal.         LABS ON ADMISSION:    Basic Metabolic Panel:  Recent Labs Lab 08/02/15 0930  NA 142  K 4.8  CL 112*  CO2 16*  GLUCOSE 179*  BUN 36*  CREATININE 1.77*  CALCIUM 9.9  CBC:  Recent Labs Lab 08/02/15 0930  WBC 12.4*  HGB 12.7  HCT 38.2  MCV 88.8  PLT 253    BNP (last 3 results)  Recent Labs  05/09/15 1815 05/11/15 0340 08/02/15 0930  BNP 587.2* 592.4* 983.8*    Creatinine clearance cannot be calculated (Unknown ideal weight.)  Radiological Exams on Admission: Dg Chest Portable 1 View  08/02/2015  CLINICAL DATA:  Shortness of Breath EXAM: PORTABLE CHEST 1 VIEW COMPARISON:  07/29/2015 FINDINGS: Cardiac shadow remains enlarged. Increased vascular congestion with bilateral pleural effusions and bibasilar atelectatic changes is seen. No acute bony  abnormality is noted. IMPRESSION: CHF with associated effusions. Electronically Signed   By: Inez Catalina M.D.   On: 08/02/2015 10:42    Echo December 2016  Study Conclusions  - Left ventricle: The cavity size was normal. There was mild concentric hypertrophy. Systolic function was normal. The estimated ejection fraction was in the range of 55% to 60%. Wall motion was normal; there were no regional wall motion abnormalities. - Ventricular septum: Septal motion showed paradox. These changes are consistent with intraventricular conduction delay. - Aortic valve: Trileaflet; mildly thickened, mildly calcified leaflets. There was mild regurgitation. -  Aorta: Ascending aortic diameter: 47 mm (S). - Ascending aorta: The ascending aorta was mildly dilated. - Mitral valve: There was mild regurgitation. - Left atrium: The atrium was mildly dilated. - Atrial septum: There was increased thickness of the septum, consistent with lipomatous hypertrophy. - Pulmonary arteries: PA peak pressure: 47 mm Hg (S).  Impressions:  - The right ventricular systolic pressure was increased consistent with moderate pulmonary hypertension.  ASSESSMENT / PLAN    Acute respiratory failure with hypoxia most likely secondary to diastolic heart failure. Has 02 at home to use as needed but lately using more frequently. On non-rebreather. Will change to nasal cannula but if desaturates again NR will be replaced and patient admitted to [facility]. If does okay off NR then admit to Telemetry.  -if breathing doesn't improve with diuresis then consider CTA to rule out PE.  Acute diastolic congestive heart failure, possibly secondary to afib.[name] BNP 900s CXR >>CHF with effusions.  Never formally diagnosed with diastolic failure though echo findings in December suggests she may be in the early stages.  -Admit toTelemetry -Lasix naive, given 20mg  IV in ED. Will increase to BID monitoring pulmonary response and  renal function - Cycle troponins - Daily weights and strict I/O - TSH normal at 2.26 in December - ECHO  - 2 gm sodium diet  - Nursing staff to provide education about CHF - PT evaluation  Atrial fibrillation with RVR. Chadvasc 6. She presented in December with new onset Afib with RVR. Started on ASA at the time, probably wasn't candidate for anticoagulation secondary to falls. Was supposed to follow up with Cardiology as outpatient. Presenting rate 125, down to 103 after IV Metoprolol.  -resume home Coreg (she didn't get it today).  -monitor on Telemetry  Elevated lactic acid, WBC 12.4. Afebrile. Tachypnea secondary to acute respiratory failure. Tachycardia secondary to Afib with RVR. Elevated lactic acid likely from acute respiratory failure. It has trended down from 2.42 to 0.68 with IVF.    Acute renal failure, probably superimposed on CKD, stage IV. Creatinine 1.94. Paucity of labs in Epic but Cr was 2.09 during December admission. Prior to that, the only labs were from 2007 at which time renal function was normal.  - Renal US - Strict I/O and daily weights - Renally dose medications - Minimize nephrotoxins and avoid ACEI/ARB  Hypertension. Controlled.  -continue home Coreg  Type II diabetes. Diet controlled -hgb a1c -SSI - sensitive -carb modified diet  CONSULTANTS:   None  Code Status: DNR DVT Prophylaxis: Lovenox  Family Communication:  Patient alert, oriented and understands plan of care.  Disposition Plan: Discharge to home in 2-3 days   Time spent: 60 minutes Tye Savoy  NP [facility] Pager [phone]

## 2015-08-03 ENCOUNTER — Inpatient Hospital Stay (HOSPITAL_COMMUNITY): Payer: Medicare Other

## 2015-08-03 DIAGNOSIS — I509 Heart failure, unspecified: Secondary | ICD-10-CM

## 2015-08-03 DIAGNOSIS — N179 Acute kidney failure, unspecified: Secondary | ICD-10-CM

## 2015-08-03 LAB — CBC
HEMATOCRIT: 33 % — AB (ref 36.0–46.0)
HEMOGLOBIN: 11.4 g/dL — AB (ref 12.0–15.0)
MCH: 30.4 pg (ref 26.0–34.0)
MCHC: 34.5 g/dL (ref 30.0–36.0)
MCV: 88 fL (ref 78.0–100.0)
Platelets: 234 10*3/uL (ref 150–400)
RBC: 3.75 MIL/uL — ABNORMAL LOW (ref 3.87–5.11)
RDW: 15.1 % (ref 11.5–15.5)
WBC: 8.5 10*3/uL (ref 4.0–10.5)

## 2015-08-03 LAB — BASIC METABOLIC PANEL
ANION GAP: 13 (ref 5–15)
BUN: 40 mg/dL — ABNORMAL HIGH (ref 6–20)
CALCIUM: 9.8 mg/dL (ref 8.9–10.3)
CO2: 20 mmol/L — AB (ref 22–32)
Chloride: 109 mmol/L (ref 101–111)
Creatinine, Ser: 1.86 mg/dL — ABNORMAL HIGH (ref 0.44–1.00)
GFR calc Af Amer: 27 mL/min — ABNORMAL LOW (ref >60)
GFR calc non Af Amer: 23 mL/min — ABNORMAL LOW (ref >60)
GLUCOSE: 144 mg/dL — AB (ref 65–99)
POTASSIUM: 4.1 mmol/L (ref 3.5–5.1)
Sodium: 142 mmol/L (ref 135–145)

## 2015-08-03 LAB — GLUCOSE, CAPILLARY
GLUCOSE-CAPILLARY: 94 mg/dL (ref 65–99)
Glucose-Capillary: 137 mg/dL — ABNORMAL HIGH (ref 65–99)
Glucose-Capillary: 143 mg/dL — ABNORMAL HIGH (ref 65–99)
Glucose-Capillary: 132 mg/dL — ABNORMAL HIGH (ref 65–99)

## 2015-08-03 LAB — URINE CULTURE

## 2015-08-03 LAB — HEMOGLOBIN A1C
Hgb A1c MFr Bld: 6.2 % — ABNORMAL HIGH (ref 4.8–5.6)
Mean Plasma Glucose: 131 mg/dL

## 2015-08-03 MED ORDER — CARVEDILOL 12.5 MG PO TABS
12.5000 mg | ORAL_TABLET | Freq: Once | ORAL | Status: DC
Start: 2015-08-03 — End: 2015-08-06
  Filled 2015-08-03: qty 1

## 2015-08-03 MED ORDER — CARVEDILOL 25 MG PO TABS
25.0000 mg | ORAL_TABLET | Freq: Two times a day (BID) | ORAL | Status: DC
  Administered 2015-08-03 – 2015-08-04 (×3): 25 mg via ORAL
  Filled 2015-08-03 (×4): qty 1

## 2015-08-03 MED ORDER — FUROSEMIDE 10 MG/ML IJ SOLN
40.0000 mg | Freq: Two times a day (BID) | INTRAMUSCULAR | Status: DC
  Administered 2015-08-03 – 2015-08-06 (×6): 40 mg via INTRAVENOUS
  Filled 2015-08-03 (×6): qty 4

## 2015-08-03 MED ORDER — CARVEDILOL 25 MG PO TABS: 25.0000 mg | ORAL_TABLET | Freq: Every day | ORAL | Status: DC

## 2015-08-03 MED ORDER — CETYLPYRIDINIUM CHLORIDE 0.05 % MT LIQD
7.0000 mL | Freq: Two times a day (BID) | OROMUCOSAL | Status: DC
  Administered 2015-08-05 – 2015-08-06 (×3): 7 mL via OROMUCOSAL

## 2015-08-03 NOTE — Progress Notes (Signed)
[facility] PROGRESS NOTE  [name] [id number] DOB: 07-Jun-1925 DOA: 08/02/2015 PCP: Salena Saner., MD  Assessment/Plan: Acute respiratory failure with hypoxia most likely secondary to diastolic heart failure. Has 02 at home to use as needed but lately using more frequently. Was On non-rebreather in the Ed. Now on 3 l.  continue with IV lasix.    Acute diastolic congestive heart failure, possibly secondary to afib.[name] BNP 900s CXR >>CHF with effusions. Never formally diagnosed with diastolic failure though echo findings in December suggests she may be in the early stages.  -will increase lasix to 40 mg IV BID.  - Cycle troponins - Daily weights 73---72 - TSH normal at 2.26 in December - ECHO pending.  - PT evaluation -urine out put ; 1.2 L   Atrial fibrillation with RVR. Chadvasc 6. She presented in December with new onset Afib with RVR. Started on ASA at the time, probably wasn't candidate for anticoagulation secondary to falls. Was supposed to follow up with Cardiology as outpatient. Presenting rate 125, down to 103 after IV Metoprolol.  -Hr mildly elevated today 100. Will increase home dose from 12.5 in am to 25 mg. And continue with 25 mg at night.  -monitor on Telemetry  Elevated lactic acid, WBC 12.4. Afebrile. Tachypnea secondary to acute respiratory failure. Tachycardia secondary to Afib with RVR. Elevated lactic acid likely from acute respiratory failure. It has trended down from 2.42 to 0.68   Acute renal failure, probably superimposed on CKD, stage IV. Creatinine 1.94. Cr was 2.09 during December admission. Prior to that, the only labs were from 2007 at which time renal function was normal.  - Strict I/O and daily weights - Renally dose medications - Minimize nephrotoxins and avoid ACEI/ARB -monitor on lasix.   Hypertension. Controlled.  -continue  Coreg   Type II diabetes. Diet controlled -hgb a1c -SSI - sensitive -carb modified diet  Code  Status: DNR Family Communication; care discussed with patient and daughter who was at bedside.  Disposition Plan: Remain inpatient.    Consultants:  none  Procedures:  ECHO   Antibiotics:  none  HPI/Subjective: She is feeling better today, dyspnea has improved.   Objective: Filed Vitals:   08/03/15 0448 08/03/15 0751  BP: 154/80 161/74  Pulse: 90 108  Temp: 98.1 F (36.7 C) 98.4 F (36.9 C)  Resp: 21 22    Intake/Output Summary (Last 24 hours) at 08/03/15 1210 Last data filed at 08/03/15 1028  Gross per 24 hour  Intake    600 ml  Output   1628 ml  Net  -1028 ml   Filed Weights   08/02/15 1533 08/03/15 0448  Weight: 73.211 kg (161 lb 6.4 oz) 72.213 kg (159 lb 3.2 oz)    Exam:   General:  NAD  Cardiovascular: S 1, S 2 IRR  Respiratory: Bilateral crackles.   Abdomen: Bs present, soft, nt  Musculoskeletal: trace edema   Data Reviewed: Basic Metabolic Panel:  Recent Labs Lab 08/02/15 0930 08/03/15 0503  NA 142 142  K 4.8 4.1  CL 112* 109  CO2 16* 20*  GLUCOSE 179* 144*  BUN 36* 40*  CREATININE 1.77* 1.86*  CALCIUM 9.9 9.8   Liver Function Tests: No results for input(s): AST, ALT, ALKPHOS, BILITOT, PROT, ALBUMIN in the last 168 hours. No results for input(s): LIPASE, AMYLASE in the last 168 hours. No results for input(s): AMMONIA in the last 168 hours. CBC:  Recent Labs Lab 08/02/15 0930 08/03/15 0503  WBC 12.4* 8.5  HGB 12.7 11.4*  HCT 38.2 33.0*  MCV 88.8 88.0  PLT 253 234   Cardiac Enzymes: No results for input(s): CKTOTAL, CKMB, CKMBINDEX, TROPONINI in the last 168 hours. BNP (last 3 results)  Recent Labs  05/09/15 1815 05/11/15 0340 08/02/15 0930  BNP 587.2* 592.4* 983.8*    ProBNP (last 3 results) No results for input(s): PROBNP in the last 8760 hours.  CBG:  Recent Labs Lab 08/02/15 2043 08/03/15 0558 08/03/15 1055  GLUCAP 146* 137* 143*    Recent Results (from the past 240 hour(s))  Urine culture      Status: None (Preliminary result)   Collection Time: 08/02/15 12:43 PM  Result Value Ref Range Status   Specimen Description URINE, CLEAN CATCH  Final   Special Requests NONE  Final   Culture TOO YOUNG TO READ  Final   Report Status PENDING  Incomplete     Studies: Dg Chest Portable 1 View  08/02/2015  CLINICAL DATA:  Shortness of Breath EXAM: PORTABLE CHEST 1 VIEW COMPARISON:  07/29/2015 FINDINGS: Cardiac shadow remains enlarged. Increased vascular congestion with bilateral pleural effusions and bibasilar atelectatic changes is seen. No acute bony abnormality is noted. IMPRESSION: CHF with associated effusions. Electronically Signed   By: Inez Catalina M.D.   On: 08/02/2015 10:42    Scheduled Meds: . calcium carbonate  1 tablet Oral BID WC  . carvedilol  12.5 mg Oral Q breakfast  . carvedilol  25 mg Oral QPM  . enoxaparin (LOVENOX) injection  30 mg Subcutaneous Q24H  . ferrous sulfate  325 mg Oral BID WC  . furosemide  20 mg Intravenous BID  . insulin aspart  0-9 Units Subcutaneous TID WC  . polyethylene glycol  17 g Oral BID  . polyvinyl alcohol  1 drop Both Eyes TID  . simvastatin  20 mg Oral QPM  . sodium chloride flush  3 mL Intravenous Q12H   Continuous Infusions:   Active Problems:   Atrial fibrillation with rapid ventricular response (HCC)   Essential hypertension, benign   Diabetes mellitus without complication (HCC)   AKI (acute kidney injury) (HCC)   CHF exacerbation (HCC)   Elevated lactic acid level   Increased lactic acid level   Dyspnea    Time spent: 35 minutes.     Niel Hummer A  [facility] Pager [phone]. If 7PM-7AM, please contact night-coverage at [email or web address], password [facility] 08/03/2015, 12:10 PM  LOS: 1 day

## 2015-08-03 NOTE — Evaluation (Signed)
Physical Therapy Evaluation Patient Details Name: [name] MRN: [id number] DOB: [birth year] Today's Date: 08/03/2015   History of Present Illness  Nature [name] is a 82 y.o. female in the ED with severe shortness of breath and nausea. Recent fall in [place] with fractured scapula and wrist fx. PMH: DM, HTN and breast cancer.  Clinical Impression  Pt admitted with above. Pt with DOE during ambulation and reports "that made me very winded" however SpO2 at 97% on 3 Lo2 via [place]. Pt with no episodes of LOB but recommend use of RW for long distance ambulation for energy conservation. Pt with good home set up and support and anticipate pt will be safe for d/c home with family.    Follow Up Recommendations No PT follow up;Supervision/Assistance - 24 hour    Equipment Recommendations  None recommended by PT    Recommendations for Other Services       Precautions / Restrictions Precautions Precautions: Fall Precaution Comments: SOB with mobility Restrictions Weight Bearing Restrictions: No      Mobility  Bed Mobility Overal bed mobility: Needs Assistance Bed Mobility: Supine to Sit     Supine to sit: HOB elevated;Supervision     General bed mobility comments: pt required increased time and used bed rail  Transfers Overall transfer level: Needs assistance Equipment used: Rolling walker (2 wheeled) Transfers: Sit to/from Stand Sit to Stand: Supervision         General transfer comment: v/c's for hand placement, increased time  Ambulation/Gait Ambulation/Gait assistance: Min guard Ambulation Distance (Feet): 100 Feet Assistive device: Rolling walker (2 wheeled) Gait Pattern/deviations: Step-through pattern;Decreased stride length Gait velocity: slow Gait velocity interpretation: Below normal speed for age/gender General Gait Details: +SOB, no episodes of LOB, pt reports "it's easier to walk with the walker"  Stairs            Wheelchair Mobility     Modified Rankin (Stroke Patients Only)       Balance Overall balance assessment: History of Falls                                           Pertinent Vitals/Pain Pain Assessment: No/denies pain    Home Living Family/patient expects to be discharged to:: Private residence Living Arrangements: Other (Comment) (granddaughter and 4 grandchildren) Available Help at Discharge: Family;Available 24 hours/day Type of Home: House Home Access: Ramped entrance     Home Layout: One level Home Equipment: Walker - 2 wheels;Cane - single point (tub bench)      Prior Function Level of Independence: Needs assistance   Gait / Transfers Assistance Needed: uses cane for ambulation  ADL's / Homemaking Assistance Needed: family present while patient bathing, assist with LB dressing. pt does do some cooking when able        Hand Dominance   Dominant Hand: Right    Extremity/Trunk Assessment   Upper Extremity Assessment: Generalized weakness           Lower Extremity Assessment: Generalized weakness      Cervical / Trunk Assessment: Normal  Communication   Communication: No difficulties  Cognition Arousal/Alertness: Awake/alert Behavior During Therapy: WFL for tasks assessed/performed Overall Cognitive Status: Within Functional Limits for tasks assessed                      General Comments      Exercises  Assessment/Plan    PT Assessment Patient needs continued PT services  PT Diagnosis Difficulty walking;Generalized weakness   PT Problem List Decreased strength;Decreased activity tolerance;Decreased balance;Decreased mobility;Cardiopulmonary status limiting activity  PT Treatment Interventions DME instruction;Gait training;Functional mobility training;Therapeutic activities;Therapeutic exercise;Balance training   PT Goals (Current goals can be found in the Care Plan section) Acute Rehab PT Goals Patient Stated Goal: home PT Goal  Formulation: With patient Time For Goal Achievement: 08/10/15 Potential to Achieve Goals: Good    Frequency Min 3X/week   Barriers to discharge        Co-evaluation               End of Session Equipment Utilized During Treatment: Gait belt;Oxygen (3Lo2 via [place]) Activity Tolerance: Patient tolerated treatment well Patient left: in chair;with call bell/phone within reach;with family/visitor present Nurse Communication: Mobility status         Time: [id number] PT Time Calculation (min) (ACUTE ONLY): 20 min   Charges:   PT Evaluation $PT Eval Low Complexity: 1 Procedure     PT G CodesKingsley Callander 08/03/2015, 12:14 PM  Kittie Plater, PT, DPT Pager #: [phone] Office #: [phone]

## 2015-08-03 NOTE — Progress Notes (Signed)
  Echocardiogram 2D Echocardiogram has been performed.  Jennette Dubin 08/03/2015, 4:43 PM

## 2015-08-04 ENCOUNTER — Encounter (HOSPITAL_COMMUNITY): Payer: Self-pay | Admitting: Student

## 2015-08-04 DIAGNOSIS — I4891 Unspecified atrial fibrillation: Secondary | ICD-10-CM

## 2015-08-04 LAB — GLUCOSE, CAPILLARY
GLUCOSE-CAPILLARY: 114 mg/dL — AB (ref 65–99)
GLUCOSE-CAPILLARY: 100 mg/dL — AB (ref 65–99)
Glucose-Capillary: 139 mg/dL — ABNORMAL HIGH (ref 65–99)
Glucose-Capillary: 113 mg/dL — ABNORMAL HIGH (ref 65–99)
Glucose-Capillary: 126 mg/dL — ABNORMAL HIGH (ref 65–99)

## 2015-08-04 LAB — CBC
HCT: 32.8 % — ABNORMAL LOW (ref 36.0–46.0)
HEMOGLOBIN: 11.1 g/dL — AB (ref 12.0–15.0)
MCH: 29.5 pg (ref 26.0–34.0)
MCHC: 33.8 g/dL (ref 30.0–36.0)
MCV: 87.2 fL (ref 78.0–100.0)
PLATELETS: 220 10*3/uL (ref 150–400)
RBC: 3.76 MIL/uL — ABNORMAL LOW (ref 3.87–5.11)
RDW: 14.9 % (ref 11.5–15.5)
WBC: 8.2 10*3/uL (ref 4.0–10.5)

## 2015-08-04 LAB — BASIC METABOLIC PANEL
ANION GAP: 15 (ref 5–15)
BUN: 43 mg/dL — AB (ref 6–20)
CALCIUM: 9.6 mg/dL (ref 8.9–10.3)
CO2: 21 mmol/L — ABNORMAL LOW (ref 22–32)
CREATININE: 1.97 mg/dL — AB (ref 0.44–1.00)
Chloride: 106 mmol/L (ref 101–111)
GFR calc Af Amer: 25 mL/min — ABNORMAL LOW (ref >60)
GFR, EST NON AFRICAN AMERICAN: 22 mL/min — AB (ref >60)
GLUCOSE: 130 mg/dL — AB (ref 65–99)
Potassium: 3.5 mmol/L (ref 3.5–5.1)
Sodium: 142 mmol/L (ref 135–145)

## 2015-08-04 NOTE — Consult Note (Signed)
Cardiology Consult    Patient ID: [name] MRN: [id number], DOB/AGE: 01/16/1926   Admit date: 08/02/2015 Date of Consult: 08/04/2015  Primary Physician: Salena Saner., MD Reason for Consult: CHF, Atrial Fibrillation Primary Cardiologist: Dr. Ron Parker (2008) Requesting Provider: Dr. Erlinda Hong   History of Present Illness    [name] is a 82 y.o. female with past medical history of Type 2 DM, HTN, HLD, PAF (diagnosed in 04/2015; not on anticoagulation), renal insufficiency and breast cancer (s/p lumpectomy) who presented to [facility] ED on 08/02/2015 for evaluation of shortness of breath.  She reports having increased fatigue for the past several weeks. She was treated for CAP three weeks ago and reported improvement in her cough and congestion upon starting antibiotics. She reports her breathing was at baseline for several days before she developed severe shortness of breath on 08/02/2015. She has PRN oxygen at home and was using this but had minimal improvement in her symptoms. She says she felt as if she could not catch her breath. Denies any associated chest pain, palpitations, orthopnea, or PND. Does report having lower extremity edema for several months but she accredited this to her ankle arthritis.  Upon arrival to the ED, she was noted to be in atrial fibrillation with RVR, HR in the 130's - 140's. Her O2 saturation was in the 80's initially and she required a NRB. She has now been transitioned to [place]. Initial labs showed a BNP of 983. WBC 12.4. Hgb 12.7. Platelets 253. Creatinine 1.77 (was 2.08 at time of hospital discharge in 04/2015). Troponin negative. CXR showed CHF with associated effusions. EKG shows atrial fibrillation with a LBBB, which is present on previous tracings last admission.  An echocardiogram was performed on 08/03/2015 which showed an EF of 35-40% with dyskinesis of the anteroseptal myocardium. Moderate aortic regurgitation, severe MR, severely dilated LA, and mildly  dilated RA were also noted. Her previous echocardiogram was in 04/2015 and showed an EF of 55-60% with mild MR.  She was started on IV Lasix at time of admission with a net output of -3.1L thus far. Her creatinine has trended up from 1.77 on admission to 1.97 on 08/04/2015. At the time, she reports her breathing is close to baseline. She remains in atrial fibrillation, but is rate-controlled in the 80's - 90's.   She was initially diagnosed with atrial fibrillation in 04/2015, when she was admitted for a fall. She suffered multiple fractures at that time including a right scapular fx along with fractures of her right 8th, 9th, and 10th ribs.  She reports less than 3 falls in the past 5 years. She currently lives with her granddaughter and her family and denies any falls since 04/2015. Uses a cane for ambulation.   Past Medical History   Past Medical History  Diagnosis Date  . Diabetes mellitus without complication ([place])   . Hypertension   . Cancer [facility])     breast cancer  . PAF (paroxysmal atrial fibrillation) ([place])     a. diagnosed 04/2015 --> not placed on anticoagulation at that time due to recent fall  . HLD (hyperlipidemia)     Past Surgical History  Procedure Laterality Date  . Abdominal hysterectomy    . Joint replacement      knees  . Fracture surgery      L femur  . Breast lumpectomy       Allergies  Allergies  Allergen Reactions  . Codeine     "talks out of  her head"  . Oxycodone Other (See Comments)    confusion    Inpatient Medications    . antiseptic oral rinse  7 mL Mouth Rinse BID  . calcium carbonate  1 tablet Oral BID WC  . carvedilol  12.5 mg Oral Once  . carvedilol  25 mg Oral BID WC  . enoxaparin (LOVENOX) injection  30 mg Subcutaneous Q24H  . ferrous sulfate  325 mg Oral BID WC  . furosemide  40 mg Intravenous BID  . insulin aspart  0-9 Units Subcutaneous TID WC  . polyethylene glycol  17 g Oral BID  . polyvinyl alcohol  1 drop Both Eyes TID  .  simvastatin  20 mg Oral QPM  . sodium chloride flush  3 mL Intravenous Q12H    Family History    Family History  Problem Relation Age of Onset  . Coronary artery disease Father   . Stroke Mother     Social History    Social History   Social History  . Marital Status: Widowed    Spouse Name: N/A  . Number of Children: N/A  . Years of Education: N/A   Occupational History  . Not on file.   Social History Main Topics  . Smoking status: Never Smoker   . Smokeless tobacco: Not on file  . Alcohol Use: No  . Drug Use: No  . Sexual Activity: No   Other Topics Concern  . Not on file   Social History Narrative     Review of Systems    General:  No chills, fever, night sweats or weight changes.  Cardiovascular:  No chest pain, orthopnea, palpitations, paroxysmal nocturnal dyspnea. Positive for dyspnea on exertion and lower extremity edema. Dermatological: No rash, lesions/masses Respiratory: No cough, Positive for dyspnea. Urologic: No hematuria, dysuria Abdominal:   No nausea, vomiting, diarrhea, bright red blood per rectum, melena, or hematemesis Neurologic:  No visual changes, wkns, changes in mental status. All other systems reviewed and are otherwise negative except as noted above.  Physical Exam    Blood pressure 140/79, pulse 90, temperature 97.8 F (36.6 C), temperature source Oral, resp. rate 20, weight 154 lb 12.8 oz (70.217 kg), SpO2 97 %.  General: Pleasant, elderly Caucasian female appearing in NAD. Psych: Normal affect. Neuro: Alert and oriented X 3. Moves all extremities spontaneously. HEENT: Normal  Neck: Supple without bruits or JVD. Lungs:  Resp regular and unlabored, minimal rales at the bases bilaterally. Currently on 3L [place]. Heart: Irregularly irregular, no s3, s4, 2/6 SEM at apex. Abdomen: Soft, non-tender, non-distended, BS + x 4.  Extremities: No clubbing or cyanosis. Trace edema. DP/PT/Radials 2+ and equal bilaterally.  Labs    Troponin  [facility] of Care Test)  Recent Labs  08/02/15 1236  TROPIPOC 0.02   No results for input(s): CKTOTAL, CKMB, TROPONINI in the last 72 hours. Lab Results  Component Value Date   WBC 8.2 08/04/2015   HGB 11.1* 08/04/2015   HCT 32.8* 08/04/2015   MCV 87.2 08/04/2015   PLT 220 08/04/2015     Recent Labs Lab 08/04/15 0435  NA 142  K 3.5  CL 106  CO2 21*  BUN 43*  CREATININE 1.97*  CALCIUM 9.6  GLUCOSE 130*     Radiology Studies    Dg Chest 2 View: 07/29/2015  CLINICAL DATA:  Cough for 1 month. EXAM: CHEST  2 VIEW COMPARISON:  May 12, 2015. FINDINGS: Stable cardiomegaly. No pneumothorax is noted. Minimal bilateral pleural effusions are  noted. Of proximal left humerus is noted consistent with old fracture. Degenerative change of right glenohumeral joint is noted. Anterior osteophyte formation is noted in lower thoracic spine. Probable old upper lumbar vertebral body fracture is noted. IMPRESSION: Minimal bilateral pleural effusions. No consolidative process is noted. Electronically Signed   By: Marijo Conception, M.D.   On: 07/29/2015 15:11   Dg Chest Portable 1 View: 08/02/2015  CLINICAL DATA:  Shortness of Breath EXAM: PORTABLE CHEST 1 VIEW COMPARISON:  07/29/2015 FINDINGS: Cardiac shadow remains enlarged. Increased vascular congestion with bilateral pleural effusions and bibasilar atelectatic changes is seen. No acute bony abnormality is noted. IMPRESSION: CHF with associated effusions. Electronically Signed   By: Inez Catalina M.D.   On: 08/02/2015 10:42    EKG & Cardiac Imaging    EKG: 08/02/2015: Atrial fibrillation, HR 94, LBBB (noted in 04/2015)  Echocardiogram: 08/03/2015 Study Conclusions - Left ventricle: Wall thickness was increased in a pattern of  moderate LVH. There was focal basal hypertrophy. Systolic  function was moderately reduced. The estimated ejection fraction  was in the range of 35% to 40%. Dyskinesis of the anteroseptal  myocardium. - Aortic valve:  There was moderate regurgitation. - Mitral valve: There was severe regurgitation. - Left atrium: The atrium was severely dilated. - Right atrium: The atrium was mildly dilated. - Tricuspid valve: There was mild-moderate regurgitation. - Pulmonary arteries: Systolic pressure was mildly increased. PA  peak pressure: 33 mm Hg (S).  Assessment & Plan    1. Acute on Chronic Combined Systolic and Diastolic CHF - presented with worsening shortness of breath over the past several days.  - BNP elevated to 983. WBC 12.4. Hgb 12.7. Platelets 253. Creatinine 1.77 (was 2.08 at time of hospital discharge in 04/2015). Troponin negative. CXR showed CHF with associated effusions.  - echo showed an EF of 35-40% with dyskinesis of the anteroseptal myocardium. Her previous echocardiogram was in 04/2015 and showed an EF of 55-60% with mild MR. With her reduced EF, she would likely benefit from an ischemic evaluation, but it is unclear at this time if she would want to proceed with that. - started on IV Lasix at time of admission with a net output of -3.1L thus far. Her creatinine has trended up from 1.77 on admission to 1.97 on 08/04/2015. Can likely transition to PO Lasix tomorrow as her volume status has significantly improved. - continue BB. [name].  2. Paroxysmal Atrial Fibrillation - Unsure how long she has been in AF, for she was initially diagnosed in 04/2015 during an admission for a fall. Was not placed on anticoagulation at that time. Patient is without palpitations or chest discomfort. - This patients CHA2DS2-VASc Score and unadjusted Ischemic Stroke Rate (% per year) is equal to 9.7 % stroke rate/year from a score of 6 (CHF, HTN, DM, Female, Age (2)). She was initially not placed on anticoagulation due to a fall risk. She reports less than 3 falls in the past 5 years. Currently resides with her granddaughter who encourages her to use a cane with ambulation to reduce her fall  risk. She wishes to discuss anticoagulation with her granddaughter before starting. If she does wish to start anticoagulation, Eliquis 2.5mg  BID would be a good option due to her elevated creatinine and age. - continue BB for rate-control. Would avoid Cardizem with her reduced EF.  3. Severe Mitral Regurgitation - noted on echo this admission - will need to be followed as outpatient  4.  HTN - BP has been 131/73 - 140/79 in the past 24 hours. - continue Coreg 25mg  BID.  5. HLD - continue statin therapy  6. Type 2 DM - per admitting team  7. Acute Renal Injury - creatinine was 2.08 at time of hospital discharge in 04/2015. - at 1.77 on admission. Elevated to  1.97 on 08/04/2015. - continue to monitor in the setting of IV diuresis.   Signed, Erma Heritage, PA-C 08/04/2015, 2:58 PM Pager: [phone] As above, patient seen and examined. Briefly she is an 82 year old female with past medical history of diabetes mellitus, hypertension, hyperlipidemia, breast cancer, renal insufficiency for evaluation of atrial fibrillation and congestive heart failure. Patient was admitted after a fall in December. She had rib fractures and fracture of her right scapula. She was treated conservatively. She was noted to be in atrial fibrillation at that time. Echocardiogram revealed Ejection fraction 55-60%, mild aortic insufficiency and mild mitral regurgitation. She was treated with rate control and discharged. Since that time she has noticed dyspnea on exertion. There is no orthopnea, PND, pedal edema, syncope or chest pain. She did have some chest tightness that increased with lying flat and deep inspiration. Her dyspnea worsened and she presented for further evaluation. Chest x-ray showed CHF with pleural effusions. Electrocardiogram showed atrial fibrillation with RVR and left bundle branch block. BUN 43 and creatinine 1.97. Echocardiogram repeated and showed ejection fraction 35-40%, biatrial  enlargement, moderate aortic insufficiency, severe mitral regurgitation and mild to moderate tricuspid regurgitation. BNP 963. Troponins negative. 1 atrial fibrillation-duration is unknown. We will increase carvedilol for rate control. Embolic risk factors include age greater than 56, female sex, diabetes mellitus, hypertension, congestive heart failure. CHADSvasc 6. She would benefit from anticoagulation long-term. There is increased risk because of previous fall but she is using a walker now and has had no issues. I would favor beginning apixaban 2.5 BID. I am not optimistic that she would hold sinus rhythm if we proceeded with cardioversion given biatrial enlargement, LV dysfunction and valvular heart disease. However if she remains symptomatic following diuresis we could consider adding amiodarone followed by TEE guided cardioversion. 2 congestive heart failure-likely from reduced LV function, valvular heart disease and atrial fibrillation. Continue Lasix 40 mg IV twice a day and follow renal function. 3 Cardiomyopathy-etiology unclear. Continue beta blocker. Watch blood pressure. We could add hydralazine/nitrates if blood pressure allows. Would avoid ACE inhibitor's given renal insufficiency. Question tachycardia mediated versus ischemia mediated. She would prefer conservative measures if possible and I think given age and baseline renal insufficiency this would be appropriate. We will consider nuclear study for risk stratification when she improves reserving heart catheterization for high risk study. 4 mitral regurgitation/aortic insufficiency-given age, renal insufficiency I do not think she would be a good candidate for surgical intervention. Plan conservative measures and patient in agreement.   5 chronic kidney disease-follow renal function closely with diuresis. Kirk Ruths

## 2015-08-04 NOTE — Progress Notes (Signed)
[facility] PROGRESS NOTE  Reynolds Bowl ZOX:[id number] DOB: 1925-12-20 DOA: 08/02/2015 PCP: Alva Garnet., MD  Assessment/Plan:  Acute respiratory failure with hypoxia most likely secondary to combined systolic and diastolic heart failure.  Has 02 at home 3 liters prn but lately using more frequently. Was On non-rebreather in the Ed. Now on 3 l.  continue with IV lasix. , was not on lasix at home   Acute on chronic combined systolic and diastolic congestive heart failure, possibly secondary to afib.[name] BNP 900s CXR >>CHF with effusions on admission, no formal diagnosis of chf before, not followed by cardiology prior to this admission.  - TSH normal at 2.26 in December, ekg with lbbb (from 2016), negative troponins, bnp elevated at 980. - ECHO with new reduced EF (The estimated ejection fraction was in the range of 35% to 40%. Dyskinesis of the anteroseptal  myocardium.) on lasix to 40 mg IV BID currentlyDaily weights 73---72--70, urine output 2liter in the last 24hrs -cardiology consulted  Atrial fibrillation with RVR.  Chadvasc 6. She presented in December with new onset Afib with RVR. Started on ASA at the time, probably wasn't candidate for anticoagulation secondary to falls. Was supposed to follow up with Cardiology as outpatient. Presenting rate 125, down to 103 after IV Metoprolol.  -Hr mildly elevated today 100. Will increase home dose from 12.5 in am to 25 mg. And continue with 25 mg at night.  -cardiology consulted  And input appreciated,   Elevated lactic acid, WBC 12.4. Afebrile. Tachypnea secondary to acute respiratory failure. Tachycardia secondary to Afib with RVR. Elevated lactic acid likely from acute respiratory failure. It has trended down from 2.42 to 0.68   Acute renal failure, probably superimposed on CKD, stage IV. Creatinine 1.94. Cr was 2.09 during December admission. Prior to that, the only labs were from 2007 at which time renal function was normal.   - Strict I/O and daily weights - Renally dose medications - Minimize nephrotoxins and avoid ACEI/ARB -monitor on lasix.   Hypertension. Controlled.  -continue  Coreg   Type II diabetes. Diet controlled -hgb a1c -SSI - sensitive -carb modified diet  Code Status: DNR Family Communication; care discussed with patient  Disposition Plan: Remain inpatient.    Consultants:  cardiology  Procedures:  ECHO   Antibiotics:  none  HPI/Subjective: She is feeling better today, dyspnea has improved. Patient expressed that she want to change primary care physician, care manger consulted.  Objective: Filed Vitals:   08/04/15 0031 08/04/15 0443  BP: 131/73 140/79  Pulse: 87 90  Temp: 98 F (36.7 C) 97.8 F (36.6 C)  Resp: 20 20    Intake/Output Summary (Last 24 hours) at 08/04/15 1317 Last data filed at 08/04/15 1100  Gross per 24 hour  Intake    366 ml  Output   2450 ml  Net  -2084 ml   Filed Weights   08/02/15 1533 08/03/15 0448 08/04/15 0443  Weight: 73.211 kg (161 lb 6.4 oz) 72.213 kg (159 lb 3.2 oz) 70.217 kg (154 lb 12.8 oz)    Exam:   General:  NAD  Cardiovascular: S 1, S 2 IRR  Respiratory: Bilateral crackles.   Abdomen: Bs present, soft, nt  Musculoskeletal: trace edema   Data Reviewed: Basic Metabolic Panel:  Recent Labs Lab 08/02/15 0930 08/03/15 0503 08/04/15 0435  NA 142 142 142  K 4.8 4.1 3.5  CL 112* 109 106  CO2 16* 20* 21*  GLUCOSE 179* 144* 130*  BUN 36* 40* 43*  CREATININE 1.77* 1.86* 1.97*  CALCIUM 9.9 9.8 9.6   Liver Function Tests: No results for input(s): AST, ALT, ALKPHOS, BILITOT, PROT, ALBUMIN in the last 168 hours. No results for input(s): LIPASE, AMYLASE in the last 168 hours. No results for input(s): AMMONIA in the last 168 hours. CBC:  Recent Labs Lab 08/02/15 0930 08/03/15 0503 08/04/15 0435  WBC 12.4* 8.5 8.2  HGB 12.7 11.4* 11.1*  HCT 38.2 33.0* 32.8*  MCV 88.8 88.0 87.2  PLT 253 234 220   Cardiac  Enzymes: No results for input(s): CKTOTAL, CKMB, CKMBINDEX, TROPONINI in the last 168 hours. BNP (last 3 results)  Recent Labs  05/09/15 1815 05/11/15 0340 08/02/15 0930  BNP 587.2* 592.4* 983.8*    ProBNP (last 3 results) No results for input(s): PROBNP in the last 8760 hours.  CBG:  Recent Labs Lab 08/03/15 1637 08/03/15 2133 08/04/15 0301 08/04/15 0630 08/04/15 1154  GLUCAP 132* 94 139* 114* 113*    Recent Results (from the past 240 hour(s))  Urine culture     Status: None   Collection Time: 08/02/15 12:43 PM  Result Value Ref Range Status   Specimen Description URINE, CLEAN CATCH  Final   Special Requests NONE  Final   Culture MULTIPLE SPECIES PRESENT, SUGGEST RECOLLECTION  Final   Report Status 08/03/2015 FINAL  Final     Studies: No results found.  Scheduled Meds: . antiseptic oral rinse  7 mL Mouth Rinse BID  . calcium carbonate  1 tablet Oral BID WC  . carvedilol  12.5 mg Oral Once  . carvedilol  25 mg Oral BID WC  . enoxaparin (LOVENOX) injection  30 mg Subcutaneous Q24H  . ferrous sulfate  325 mg Oral BID WC  . furosemide  40 mg Intravenous BID  . insulin aspart  0-9 Units Subcutaneous TID WC  . polyethylene glycol  17 g Oral BID  . polyvinyl alcohol  1 drop Both Eyes TID  . simvastatin  20 mg Oral QPM  . sodium chloride flush  3 mL Intravenous Q12H   Continuous Infusions:   Active Problems:   Atrial fibrillation with rapid ventricular response (HCC)   Essential hypertension, benign   Diabetes mellitus without complication (HCC)   AKI (acute kidney injury) (HCC)   CHF exacerbation (HCC)   Elevated lactic acid level   Increased lactic acid level   Dyspnea    Time spent: 35 minutes.     Zelia Yzaguirre MD PhD  [facility] Pager [phone]. If 7PM-7AM, please contact night-coverage at [email or web address], password [facility] 08/04/2015, 1:17 PM  LOS: 2 days

## 2015-08-04 NOTE — Care Management Note (Signed)
Case Management Note  Patient Details  Name: Jenetta Dohm MRN: [id number] Date of Birth: Sep 14, 1925  Subjective/Objective:        Admitted with CHF            Action/Plan: Patient lives at home with her granddaughter's family. Patient has private insurance with [employer] with prescription drug coverage; [facility] [place] (spouse was in the [employer]) States no problem getting her medication. PCP is Dr Karlton Lemon - received message that patient wants to change PCP; patient likes her PCP but her family does not like her and they are thinking about changing her PCP to their PCP; patient stated that her family is handling that issue. Patient have a cane, home oxygen and walker at home. She has scales at home and weighs herself daily. Offered [place] services, patient stated that she does not want that at this time and stated that a nurse from the [facility] is coming out to see her and she does not want any additional services at this time.  Expected Discharge Date:   possibly 08/06/2015               Expected Discharge Plan:  Home/Self Care  Discharge planning Services  CM Consult Choice offered to:  Patient  Status of Service:  In process, will continue to follow  Sherrilyn Rist [id number] 08/04/2015, 1:22 PM

## 2015-08-05 DIAGNOSIS — I509 Heart failure, unspecified: Secondary | ICD-10-CM | POA: Insufficient documentation

## 2015-08-05 DIAGNOSIS — I5023 Acute on chronic systolic (congestive) heart failure: Secondary | ICD-10-CM

## 2015-08-05 LAB — GLUCOSE, CAPILLARY
GLUCOSE-CAPILLARY: 101 mg/dL — AB (ref 65–99)
GLUCOSE-CAPILLARY: 95 mg/dL (ref 65–99)
GLUCOSE-CAPILLARY: 120 mg/dL — AB (ref 65–99)
Glucose-Capillary: 178 mg/dL — ABNORMAL HIGH (ref 65–99)

## 2015-08-05 LAB — BASIC METABOLIC PANEL
Anion gap: 13 (ref 5–15)
BUN: 43 mg/dL — ABNORMAL HIGH (ref 6–20)
CALCIUM: 9.1 mg/dL (ref 8.9–10.3)
CO2: 22 mmol/L (ref 22–32)
CREATININE: 1.97 mg/dL — AB (ref 0.44–1.00)
Chloride: 102 mmol/L (ref 101–111)
GFR calc non Af Amer: 22 mL/min — ABNORMAL LOW (ref >60)
GFR, EST AFRICAN AMERICAN: 25 mL/min — AB (ref >60)
GLUCOSE: 113 mg/dL — AB (ref 65–99)
Potassium: 3.2 mmol/L — ABNORMAL LOW (ref 3.5–5.1)
Sodium: 137 mmol/L (ref 135–145)

## 2015-08-05 MED ORDER — INSULIN ASPART 100 UNIT/ML ~~LOC~~ SOLN
0.0000 [IU] | Freq: Three times a day (TID) | SUBCUTANEOUS | Status: DC
  Administered 2015-08-05 – 2015-08-06 (×2): 2 [IU] via SUBCUTANEOUS

## 2015-08-05 MED ORDER — APIXABAN 2.5 MG PO TABS
2.5000 mg | ORAL_TABLET | Freq: Two times a day (BID) | ORAL | Status: DC
  Administered 2015-08-05 – 2015-08-06 (×3): 2.5 mg via ORAL
  Filled 2015-08-05 (×3): qty 1

## 2015-08-05 MED ORDER — POTASSIUM CHLORIDE CRYS ER 20 MEQ PO TBCR
40.0000 meq | EXTENDED_RELEASE_TABLET | Freq: Once | ORAL | Status: AC
  Administered 2015-08-05: 40 meq via ORAL
  Filled 2015-08-05: qty 2

## 2015-08-05 MED ORDER — HYDRALAZINE HCL 10 MG PO TABS
10.0000 mg | ORAL_TABLET | Freq: Three times a day (TID) | ORAL | Status: DC
  Administered 2015-08-05 – 2015-08-06 (×3): 10 mg via ORAL
  Filled 2015-08-05 (×4): qty 1

## 2015-08-05 MED ORDER — CALCIUM CARBONATE 1250 (500 CA) MG PO TABS
1.0000 | ORAL_TABLET | Freq: Two times a day (BID) | ORAL | Status: DC
  Administered 2015-08-05 – 2015-08-06 (×3): 500 mg via ORAL
  Filled 2015-08-05 (×3): qty 1

## 2015-08-05 MED ORDER — FERROUS SULFATE 325 (65 FE) MG PO TABS
325.0000 mg | ORAL_TABLET | Freq: Two times a day (BID) | ORAL | Status: DC
  Administered 2015-08-05 – 2015-08-06 (×3): 325 mg via ORAL
  Filled 2015-08-05 (×3): qty 1

## 2015-08-05 MED ORDER — CARVEDILOL 25 MG PO TABS
25.0000 mg | ORAL_TABLET | Freq: Two times a day (BID) | ORAL | Status: DC
  Administered 2015-08-05 – 2015-08-06 (×3): 25 mg via ORAL
  Filled 2015-08-05 (×3): qty 1

## 2015-08-05 NOTE — Progress Notes (Signed)
Pharmacist Heart Failure Core Measure Documentation  Assessment: [name] has an EF documented as 35-40% on 08/03/2015 by Echo.  Rationale: Heart failure patients with left ventricular systolic dysfunction (LVSD) and an EF < 40% should be prescribed an angiotensin converting enzyme inhibitor (ACEI) or angiotensin receptor blocker (ARB) at discharge unless a contraindication is documented in the medical record.  This patient is not currently on an ACEI or ARB for HF.  This note is being placed in the record in order to provide documentation that a contraindication to the use of these agents is present for this encounter.  ACE Inhibitor or Angiotensin Receptor Blocker is contraindicated (specify all that apply)  []   ACEI allergy AND ARB allergy []   Angioedema []   Moderate or severe aortic stenosis []   Hyperkalemia []   Hypotension []   Renal artery stenosis [x]   Worsening renal function, preexisting renal disease or dysfunction  Per cardiology note: [name]  Manley Mason 08/05/2015 12:53 PM

## 2015-08-05 NOTE — Progress Notes (Signed)
Physical Therapy Treatment Patient Details Name: [name] MRN: [id number] DOB: 17-Nov-1925 Today's Date: 08/05/2015    History of Present Illness [name] is a 82 y.o. female in the ED with severe shortness of breath and nausea. Recent fall in [place] with fractured scapula and wrist fx. PMH: DM, HTN and breast cancer.    PT Comments    Pt was motivated and used walker well but did adjust it for lower height to increase her advantage with posture.  Pt is tending to push out walker but may just be the height correction being needed, PT took care of it.  Will continue with gait and stairs next visit if needed to get home.  Follow Up Recommendations  No PT follow up;Supervision/Assistance - 24 hour     Equipment Recommendations  None recommended by PT    Recommendations for Other Services       Precautions / Restrictions Precautions Precautions: Fall Precaution Comments: SOB with mobility Restrictions Weight Bearing Restrictions: No    Mobility  Bed Mobility Overal bed mobility: Modified Independent                Transfers Overall transfer level: Needs assistance Equipment used: Rolling walker (2 wheeled) Transfers: Sit to/from [facility] Sit to Stand: Supervision (only for safety) Stand pivot transfers: Supervision (only for safety)       General transfer comment: cued hand placement  Ambulation/Gait Ambulation/Gait assistance: Min guard (for safety) Ambulation Distance (Feet): 190 Feet Assistive device: Rolling walker (2 wheeled) Gait Pattern/deviations: Step-through pattern;Wide base of support;Trunk flexed (pushes walker out in front) Gait velocity: slow, hesitations Gait velocity interpretation: Below normal speed for age/gender General Gait Details: mild SOB   Stairs            Wheelchair Mobility    Modified Rankin (Stroke Patients Only)       Balance Overall balance assessment: History of Falls                                   Cognition Arousal/Alertness: Awake/alert Behavior During Therapy: WFL for tasks assessed/performed Overall Cognitive Status: Within Functional Limits for tasks assessed                      Exercises      General Comments General comments (skin integrity, edema, etc.): Has some motivation to get home based on her care for great grandchildren      Pertinent Vitals/Pain Pain Assessment: No/denies pain    Home Living                      Prior Function            PT Goals (current goals can now be found in the care plan section) Acute Rehab PT Goals Patient Stated Goal: home Progress towards PT goals: Progressing toward goals    Frequency  Min 3X/week    PT Plan Current plan remains appropriate    Co-evaluation             End of Session Equipment Utilized During Treatment: Gait belt;Oxygen Activity Tolerance: Patient tolerated treatment well Patient left: in bed;with call bell/phone within reach;with bed alarm set;with family/visitor present     Time: [id number] PT Time Calculation (min) (ACUTE ONLY): 32 min  Charges:  $Gait Training: 8-22 mins $Therapeutic Activity: 8-22 mins  G Codes:      Ramond Dial 08/05/2015, 4:13 PM   Mee Hives, PT MS Acute Rehab Dept. Number: [phone] [phone] and [place] [phone]

## 2015-08-05 NOTE — Discharge Instructions (Signed)

## 2015-08-05 NOTE — Progress Notes (Signed)
[facility] PROGRESS NOTE  Reynolds Bowl QIO:[id number] DOB: 05-06-1926 DOA: 08/02/2015 PCP: Alva Garnet., MD  Assessment/Plan:  Acute respiratory failure with hypoxia most likely secondary to combined systolic and diastolic heart failure.  Has 02 at home 3 liters prn but lately using more frequently. Was On non-rebreather in the Ed. Now on 3 l.  continue with IV lasix. , was not on lasix at home   Acute on chronic combined systolic and diastolic congestive heart failure, possibly secondary to afib.[name] BNP 900s CXR >>CHF with effusions on admission, no formal diagnosis of chf before, not followed by cardiology prior to this admission.  - TSH normal at 2.26 in December, ekg with lbbb (from 2016), negative troponins, bnp elevated at 980. - ECHO with new reduced EF (The estimated ejection fraction was in the range of 35% to 40%. Dyskinesis of the anteroseptal  myocardium.) on lasix to 40 mg IV BID currentlyDaily weights 73---72--70, urine output 2liter in the last 24hrs -cardiology consulted, input appreciated,   Atrial fibrillation with RVR.  Chadvasc 6. She presented in December with new onset Afib with RVR. Started on ASA at the time, probably wasn't candidate for anticoagulation secondary to falls. Was supposed to follow up with Cardiology as outpatient. Presenting rate 125, down to 103 after IV Metoprolol.  -Hr mildly elevated initially, home meds coreg dose increased from 12.5 bid  to 25 mg bid -cardiology consulted  And input appreciated, patient is started on apixaban.   Elevated lactic acid, WBC 12.4. Afebrile. Tachypnea secondary to acute respiratory failure. Tachycardia secondary to Afib with RVR. Elevated lactic acid likely from acute respiratory failure. It has trended down from 2.42 to 0.68   Acute renal failure, probably superimposed on CKD, stage IV. Creatinine 1.94. Cr was 2.09 during December admission. Prior to that, the only labs were from 2007 at which time  renal function was normal.  - Renally dose medications - Minimize nephrotoxins and avoid ACEI/ARB -monitor on lasix.   Hypertension. Controlled.  -continue  Coreg   Type II diabetes. Diet controlled -hgb a1c 6.2 -SSI - sensitive -carb modified diet  Code Status: DNR Family Communication; care discussed with patient and daughter in room Disposition Plan: likely discharge home on 3/10 , continue home oxygen. Patient did well with PT.   Consultants:  cardiology  Procedures:  ECHO   Antibiotics:  none  HPI/Subjective: She is feeling better today, dyspnea has improved. Patient expressed that she want to change primary care physician, care manger consulted.  Objective: Filed Vitals:   08/05/15 0825 08/05/15 1243  BP: 136/58 112/62  Pulse: 86 77  Temp:  97.7 F (36.5 C)  Resp:  18    Intake/Output Summary (Last 24 hours) at 08/05/15 1403 Last data filed at 08/05/15 1300  Gross per 24 hour  Intake   1060 ml  Output   1700 ml  Net   -640 ml   Filed Weights   08/03/15 0448 08/04/15 0443 08/05/15 0359  Weight: 72.213 kg (159 lb 3.2 oz) 70.217 kg (154 lb 12.8 oz) 68.811 kg (151 lb 11.2 oz)    Exam:   General:  NAD  Cardiovascular: S 1, S 2 IRR  Respiratory: Bilateral crackles improving  Abdomen: Bs present, soft, nt  Musculoskeletal: trace edema resolving   Data Reviewed: Basic Metabolic Panel:  Recent Labs Lab 08/02/15 0930 08/03/15 0503 08/04/15 0435 08/05/15 0501  NA 142 142 142 137  K 4.8 4.1 3.5 3.2*  CL 112* 109 106 102  CO2  16* 20* 21* 22  GLUCOSE 179* 144* 130* 113*  BUN 36* 40* 43* 43*  CREATININE 1.77* 1.86* 1.97* 1.97*  CALCIUM 9.9 9.8 9.6 9.1   Liver Function Tests: No results for input(s): AST, ALT, ALKPHOS, BILITOT, PROT, ALBUMIN in the last 168 hours. No results for input(s): LIPASE, AMYLASE in the last 168 hours. No results for input(s): AMMONIA in the last 168 hours. CBC:  Recent Labs Lab 08/02/15 0930 08/03/15 0503  08/04/15 0435  WBC 12.4* 8.5 8.2  HGB 12.7 11.4* 11.1*  HCT 38.2 33.0* 32.8*  MCV 88.8 88.0 87.2  PLT 253 234 220   Cardiac Enzymes: No results for input(s): CKTOTAL, CKMB, CKMBINDEX, TROPONINI in the last 168 hours. BNP (last 3 results)  Recent Labs  05/09/15 1815 05/11/15 0340 08/02/15 0930  BNP 587.2* 592.4* 983.8*    ProBNP (last 3 results) No results for input(s): PROBNP in the last 8760 hours.  CBG:  Recent Labs Lab 08/04/15 1154 08/04/15 1639 08/04/15 2038 08/05/15 0605 08/05/15 1109  GLUCAP 113* 126* 100* 101* 178*    Recent Results (from the past 240 hour(s))  Urine culture     Status: None   Collection Time: 08/02/15 12:43 PM  Result Value Ref Range Status   Specimen Description URINE, CLEAN CATCH  Final   Special Requests NONE  Final   Culture MULTIPLE SPECIES PRESENT, SUGGEST RECOLLECTION  Final   Report Status 08/03/2015 FINAL  Final     Studies: No results found.  Scheduled Meds: . antiseptic oral rinse  7 mL Mouth Rinse BID  . apixaban  2.5 mg Oral BID  . calcium carbonate  1 tablet Oral BID WC  . carvedilol  12.5 mg Oral Once  . carvedilol  25 mg Oral BID WC  . ferrous sulfate  325 mg Oral BID WC  . furosemide  40 mg Intravenous BID  . hydrALAZINE  10 mg Oral 3 times per day  . insulin aspart  0-9 Units Subcutaneous TID WC  . polyethylene glycol  17 g Oral BID  . polyvinyl alcohol  1 drop Both Eyes TID  . potassium chloride  40 mEq Oral Once  . simvastatin  20 mg Oral QPM  . sodium chloride flush  3 mL Intravenous Q12H   Continuous Infusions:   Active Problems:   Atrial fibrillation with rapid ventricular response (HCC)   Essential hypertension, benign   Diabetes mellitus without complication (HCC)   AKI (acute kidney injury) (HCC)   CHF exacerbation (HCC)   Elevated lactic acid level   Increased lactic acid level   Dyspnea    Time spent: 25 minutes.     Barton Want MD PhD  [facility] Pager [phone]. If 7PM-7AM,  please contact night-coverage at [email or web address], password [facility] 08/05/2015, 2:03 PM  LOS: 3 days

## 2015-08-05 NOTE — Progress Notes (Signed)
Benefit check for Eliquis   RE: Benefit check      Memory Argue CMA            S/W MARK @ [employer] # [phone]   ELIQUIS 2.5 MG BID FOR 30 DAY SUPPLY   COVER- YES  CO-PAY- $ 45.00  TIER- 3 DRUG  PRIOR APPROVAL - YES # [phone]  [facility] : [facility]   APIXABAN-  NOT ON FORMULARY

## 2015-08-05 NOTE — Care Management Important Message (Signed)
Important Message  Patient Details  Name: [name] MRN: [id number] Date of Birth: 12/01/1925   Medicare Important Message Given:  Yes    Manjinder Breau Abena 08/05/2015, 12:01 PM

## 2015-08-05 NOTE — Progress Notes (Signed)
Hospital Problem List     Active Problems:   Atrial fibrillation with rapid ventricular response (HCC)   Essential hypertension, benign   Diabetes mellitus without complication (HCC)   AKI (acute kidney injury) ([place])   CHF exacerbation (HCC)   Elevated lactic acid level   Increased lactic acid level   Dyspnea    Patient Profile:   Primary Cardiologist: Dr. Ron Parker (2008)  82 y.o. female w/ PMHof Type 2 DM, HTN, HLD, PAF (diagnosed in 04/2015; not on anticoagulation), renal insufficiency and breast cancer (s/p lumpectomy) who presented to [facility] ED on 08/02/2015 for evaluation of shortness of breath, found to be in atrial fibrillation with RVR. Echo this admission shows EF of 35-40% and severe MR.  Subjective   Patient reports her breathing has continued to improve. Denies chest pain or palpitations. Reports frequent urination with the IV Lasix. Patient's daughter is at the bedside as well.  Inpatient Medications    . antiseptic oral rinse  7 mL Mouth Rinse BID  . calcium carbonate  1 tablet Oral BID WC  . carvedilol  12.5 mg Oral Once  . carvedilol  25 mg Oral BID WC  . enoxaparin (LOVENOX) injection  30 mg Subcutaneous Q24H  . ferrous sulfate  325 mg Oral BID WC  . furosemide  40 mg Intravenous BID  . insulin aspart  0-9 Units Subcutaneous TID WC  . polyethylene glycol  17 g Oral BID  . polyvinyl alcohol  1 drop Both Eyes TID  . simvastatin  20 mg Oral QPM  . sodium chloride flush  3 mL Intravenous Q12H    Vital Signs    Filed Vitals:   08/04/15 1726 08/04/15 1938 08/05/15 0359 08/05/15 0825  BP: 141/61 131/67 137/65 136/58  Pulse: 91 95 81 86  Temp:  98.1 F (36.7 C) 98.1 F (36.7 C)   TempSrc:  Oral Oral   Resp:  16 19   Weight:   151 lb 11.2 oz (68.811 kg)   SpO2:  95% 97%     Intake/Output Summary (Last 24 hours) at 08/05/15 0901 Last data filed at 08/05/15 0651  Gross per 24 hour  Intake    843 ml  Output   2000 ml  Net  -1157 ml   Filed Weights    08/03/15 0448 08/04/15 0443 08/05/15 0359  Weight: 159 lb 3.2 oz (72.213 kg) 154 lb 12.8 oz (70.217 kg) 151 lb 11.2 oz (68.811 kg)    Physical Exam    General: Elderly Caucasian  female appearing in no acute distress. Head: Normocephalic, atraumatic.  Neck: Supple without bruits, JVD not elevated. Lungs:  Resp regular and unlabored, rales at bases bilaterally. Heart: Irregularly irregular, S1, S2, no S3, S4, 2/6 SEM at Apex; no rub. Abdomen: Soft, non-tender, non-distended with normoactive bowel sounds. No hepatomegaly. No rebound/guarding. No obvious abdominal masses. Extremities: No clubbing, cyanosis, or edema. Distal pedal pulses are 2+ bilaterally. Neuro: Alert and oriented X 3. Moves all extremities spontaneously. Psych: Normal affect.  Labs    CBC  Recent Labs  08/03/15 0503 08/04/15 0435  WBC 8.5 8.2  HGB 11.4* 11.1*  HCT 33.0* 32.8*  MCV 88.0 87.2  PLT 234 [id number]   Basic Metabolic Panel  Recent Labs  08/04/15 0435 08/05/15 0501  NA 142 137  K 3.5 3.2*  CL 106 102  CO2 21* 22  GLUCOSE 130* 113*  BUN 43* 43*  CREATININE 1.97* 1.97*  CALCIUM 9.6 9.1   Liver  Function Tests No results for input(s): AST, ALT, ALKPHOS, BILITOT, PROT, ALBUMIN in the last 72 hours. No results for input(s): LIPASE, AMYLASE in the last 72 hours. Cardiac Enzymes No results for input(s): CKTOTAL, CKMB, CKMBINDEX, TROPONINI in the last 72 hours. BNP Invalid input(s): POCBNP D-Dimer No results for input(s): DDIMER in the last 72 hours. Hemoglobin A1C  Recent Labs  08/02/15 1416  HGBA1C 6.2*     Telemetry    Atrial fibrillation, rate-controlled in the 70's - 80's.   ECG    No new tracings.   Cardiac Studies and Radiology    Dg Chest 2 View: 07/29/2015  CLINICAL DATA:  Cough for 1 month. EXAM: CHEST  2 VIEW COMPARISON:  May 12, 2015. FINDINGS: Stable cardiomegaly. No pneumothorax is noted. Minimal bilateral pleural effusions are noted. Of proximal left humerus is  noted consistent with old fracture. Degenerative change of right glenohumeral joint is noted. Anterior osteophyte formation is noted in lower thoracic spine. Probable old upper lumbar vertebral body fracture is noted. IMPRESSION: Minimal bilateral pleural effusions. No consolidative process is noted. Electronically Signed   By: Marijo Conception, M.D.   On: 07/29/2015 15:11   Dg Chest Portable 1 View: 08/02/2015  CLINICAL DATA:  Shortness of Breath EXAM: PORTABLE CHEST 1 VIEW COMPARISON:  07/29/2015 FINDINGS: Cardiac shadow remains enlarged. Increased vascular congestion with bilateral pleural effusions and bibasilar atelectatic changes is seen. No acute bony abnormality is noted. IMPRESSION: CHF with associated effusions. Electronically Signed   By: Inez Catalina M.D.   On: 08/02/2015 10:42   Echocardiogram: 08/03/2015 Study Conclusions - Left ventricle: Wall thickness was increased in a pattern of  moderate LVH. There was focal basal hypertrophy. Systolic  function was moderately reduced. The estimated ejection fraction  was in the range of 35% to 40%. Dyskinesis of the anteroseptal  myocardium. - Aortic valve: There was moderate regurgitation. - Mitral valve: There was severe regurgitation. - Left atrium: The atrium was severely dilated. - Right atrium: The atrium was mildly dilated. - Tricuspid valve: There was mild-moderate regurgitation. - Pulmonary arteries: Systolic pressure was mildly increased. PA  peak pressure: 33 mm Hg (S).  Assessment & Plan    1. 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  - BNP elevated to 983. WBC 12.4. Hgb 12.7. Platelets 253. Creatinine 1.77 (was 2.08 at time of hospital discharge in 04/2015). Troponin negative. CXR showed CHF with associated effusions.  - echo showed an EF of 35-40% with dyskinesis of the anteroseptal myocardium. Her previous  echocardiogram was in 04/2015 and showed an EF of 55-60% with mild MR. Concern for tachycardia-mediated vs. ischemic etiology. Per Dr. Stanford Breed, we can consider a nuclear study for risk stratification when she improves reserving heart catheterization for high risk study. The patient and her daughter do not wish for further testing, for if she did have a high-risk study, they would not want to proceed with a catheterization. - started on IV Lasix at time of admission with a net output of -3.8L thus far. Weight down 10 lbs (161 --> 151 lbs). Her creatinine has trended up from 1.77 on admission to 1.97 on 08/05/2015. She still has rales on examination. Would continue IV diuresis for at least an additional day. Possible switch to PO tomorrow. - continue BB. [name].  2. Paroxysmal Atrial Fibrillation - Unsure how long she has been in AF, for  she was initially diagnosed in 04/2015 during an admission for a fall. Was not placed on anticoagulation at that time. Patient is without palpitations or chest discomfort. - This patients CHA2DS2-VASc Score and unadjusted Ischemic Stroke Rate (% per year) is equal to 9.7 % stroke rate/year from a score of 6 (CHF, HTN, DM, Female, Age (2)). She was initially not placed on anticoagulation due to a fall risk. She reports less than 3 falls in the past 5 years. Currently resides with her granddaughter who encourages her to use a cane with ambulation to reduce her fall risk. Her daughter was present at the bedside today. A long discussion was held regarding atrial fibrillation, stroke risk, need for anticoagulation, and with risks and benefits of anticoagulation. The patient and her daughter are in agreement to start anticoagulation. Will initiate Eliquis 2.5mg  BID ( decreased dose due to elevated creatinine and age). Case Management to see for benefits check and 30-day card. - continue BB for rate-control. Would avoid Cardizem with her reduced  EF.  3. Severe Mitral Regurgitation - noted on echo this admission.  - will need to be followed as outpatient. Not a good surgical candidate.  4. HTN - BP has been 131/58 - 141/67 in the past 24 hours. - continue Coreg 25mg  BID.  5. HLD - continue statin therapy  6. Type 2 DM - per admitting team  7. Acute on Chronic Stage 4 CKD - followed by Nephrologist as outpatient. Creatinine was 2.08 at time of hospital discharge in 04/2015. - at 1.77 on admission. Elevated to1.97 on 08/05/2015. - continue to monitor in the setting of IV diuresis.   Signed, Erma Heritage , PA-C 9:01 AM 08/05/2015 Pager: [phone] As above, patient seen and examined. She denies chest pain. She has mild dyspnea but improving. She remains in atrial fibrillation. Continue carvedilol for rate control. Her rate is in the 70s. Continue apixaban. Plan rate control and anticoagulation. She continues to have some volume excess. Continue Lasix at present dose and follow renal function closely given baseline renal insufficiency. She wants only conservative measures. We were therefore not pursue further testing such as stress test or cardiac catheterization. I think this is appropriate given her age. Given cardiomyopathy we will continue carvedilol. No ACE inhibitor given renal insufficiency. Add hydralazine today and possibly nitrates tomorrow if blood pressure allows. Kirk Ruths

## 2015-08-06 DIAGNOSIS — I5043 Acute on chronic combined systolic (congestive) and diastolic (congestive) heart failure: Secondary | ICD-10-CM

## 2015-08-06 LAB — BASIC METABOLIC PANEL
ANION GAP: 11 (ref 5–15)
BUN: 49 mg/dL — ABNORMAL HIGH (ref 6–20)
CALCIUM: 8.9 mg/dL (ref 8.9–10.3)
CO2: 23 mmol/L (ref 22–32)
Chloride: 102 mmol/L (ref 101–111)
Creatinine, Ser: 2.1 mg/dL — ABNORMAL HIGH (ref 0.44–1.00)
GFR calc Af Amer: 23 mL/min — ABNORMAL LOW (ref >60)
GFR, EST NON AFRICAN AMERICAN: 20 mL/min — AB (ref >60)
GLUCOSE: 113 mg/dL — AB (ref 65–99)
Potassium: 3.8 mmol/L (ref 3.5–5.1)
SODIUM: 136 mmol/L (ref 135–145)

## 2015-08-06 LAB — GLUCOSE, CAPILLARY
GLUCOSE-CAPILLARY: 107 mg/dL — AB (ref 65–99)
Glucose-Capillary: 183 mg/dL — ABNORMAL HIGH (ref 65–99)

## 2015-08-06 MED ORDER — ACETAMINOPHEN 500 MG PO TABS: 500.0000 mg | ORAL_TABLET | Freq: Three times a day (TID) | ORAL | Status: DC

## 2015-08-06 MED ORDER — HYDRALAZINE HCL 10 MG PO TABS: 10.0000 mg | ORAL_TABLET | Freq: Three times a day (TID) | ORAL | Status: DC

## 2015-08-06 MED ORDER — APIXABAN 2.5 MG PO TABS: 2.5000 mg | ORAL_TABLET | Freq: Two times a day (BID) | ORAL | Status: DC

## 2015-08-06 MED ORDER — ALUM & MAG HYDROXIDE-SIMETH 200-200-20 MG/5ML PO SUSP
15.0000 mL | Freq: Once | ORAL | Status: DC
  Filled 2015-08-06: qty 30

## 2015-08-06 MED ORDER — FUROSEMIDE 40 MG PO TABS: 40.0000 mg | ORAL_TABLET | Freq: Two times a day (BID) | ORAL | Status: DC

## 2015-08-06 NOTE — Progress Notes (Signed)
Patient Profile:   Primary Cardiologist: Dr. Ron Parker (2008)  82 y.o. female w/ PMHof Type 2 DM, HTN, HLD, PAF (diagnosed in 04/2015) renal insufficiency and breast cancer (s/p lumpectomy) who presented to [facility] ED on 08/02/2015 for evaluation of shortness of breath, found to be in atrial fibrillation with RVR. Echo this admission shows EF of 35-40% and severe MR.  Subjective   No dyspnea or chest pain  Inpatient Medications    . alum & mag hydroxide-simeth  15 mL Oral Once  . antiseptic oral rinse  7 mL Mouth Rinse BID  . apixaban  2.5 mg Oral BID  . calcium carbonate  1 tablet Oral BID WC  . carvedilol  12.5 mg Oral Once  . carvedilol  25 mg Oral BID WC  . ferrous sulfate  325 mg Oral BID WC  . furosemide  40 mg Intravenous BID  . hydrALAZINE  10 mg Oral 3 times per day  . insulin aspart  0-9 Units Subcutaneous TID WC  . polyethylene glycol  17 g Oral BID  . polyvinyl alcohol  1 drop Both Eyes TID  . simvastatin  20 mg Oral QPM  . sodium chloride flush  3 mL Intravenous Q12H    Vital Signs    Filed Vitals:   08/05/15 1710 08/06/15 0320 08/06/15 0757 08/06/15 1110  BP: 129/57 123/63 126/52 115/56  Pulse: 67 77 64 82  Temp:  97.7 F (36.5 C)  98.3 F (36.8 C)  TempSrc:  Oral  Oral  Resp:  20  18  Weight:  151 lb 3.2 oz (68.584 kg)    SpO2:  98%  100%    Intake/Output Summary (Last 24 hours) at 08/06/15 1200 Last data filed at 08/06/15 1110  Gross per 24 hour  Intake    840 ml  Output   1555 ml  Net   -715 ml   Filed Weights   08/04/15 0443 08/05/15 0359 08/06/15 0320  Weight: 154 lb 12.8 oz (70.217 kg) 151 lb 11.2 oz (68.811 kg) 151 lb 3.2 oz (68.584 kg)    Physical Exam    General: Elderly Caucasian  female appearing in no acute distress. Head: Normal Neck: Supple Lungs:  CTA Heart: Irregular Abdomen: Soft, non-tender, non-distended  Extremities: No edema.  Neuro: Grossly intact   Labs    CBC  Recent Labs  08/04/15 0435  WBC 8.2  HGB  11.1*  HCT 32.8*  MCV 87.2  PLT [id number]   Basic Metabolic Panel  Recent Labs  08/05/15 0501 08/06/15 0430  NA 137 136  K 3.2* 3.8  CL 102 102  CO2 22 23  GLUCOSE 113* 113*  BUN 43* 49*  CREATININE 1.97* 2.10*  CALCIUM 9.1 8.9     Telemetry    Atrial fibrillation, rate-controlled in the 70's - 80's.      Cardiac Studies and Radiology    Dg Chest 2 View: 07/29/2015  CLINICAL DATA:  Cough for 1 month. EXAM: CHEST  2 VIEW COMPARISON:  May 12, 2015. FINDINGS: Stable cardiomegaly. No pneumothorax is noted. Minimal bilateral pleural effusions are noted. Of proximal left humerus is noted consistent with old fracture. Degenerative change of right glenohumeral joint is noted. Anterior osteophyte formation is noted in lower thoracic spine. Probable old upper lumbar vertebral body fracture is noted. IMPRESSION: Minimal bilateral pleural effusions. No consolidative process is noted. Electronically Signed   By: Marijo Conception, M.D.   On: 07/29/2015 15:11   Dg  Chest Portable 1 View: 08/02/2015  CLINICAL DATA:  Shortness of Breath EXAM: PORTABLE CHEST 1 VIEW COMPARISON:  07/29/2015 FINDINGS: Cardiac shadow remains enlarged. Increased vascular congestion with bilateral pleural effusions and bibasilar atelectatic changes is seen. No acute bony abnormality is noted. IMPRESSION: CHF with associated effusions. Electronically Signed   By: Inez Catalina M.D.   On: 08/02/2015 10:42   Echocardiogram: 08/03/2015 Study Conclusions - Left ventricle: Wall thickness was increased in a pattern of  moderate LVH. There was focal basal hypertrophy. Systolic  function was moderately reduced. The estimated ejection fraction  was in the range of 35% to 40%. Dyskinesis of the anteroseptal  myocardium. - Aortic valve: There was moderate regurgitation. - Mitral valve: There was severe regurgitation. - Left atrium: The atrium was severely dilated. - Right atrium: The atrium was mildly dilated. - Tricuspid  valve: There was mild-moderate regurgitation. - Pulmonary arteries: Systolic pressure was mildly increased. PA  peak pressure: 33 mm Hg (S).  Assessment & Plan    1. 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  - BNP elevated to 983. CXR showed CHF with associated effusions.  - echo showed an EF of 35-40% with dyskinesis of the anteroseptal myocardium and severe MR. Her previous echocardiogram was in 04/2015 and showed an EF of 55-60% with mild MR. Concern for tachycardia-mediated vs. ischemic etiology. Review further workup with patient and daughter and they want conservative measures given age and overall medical condition. She is much improved. Continue Lasix but change to 40 mg by mouth twice a day. Continue beta blocker and hydralazine. No ACE inhibitor given severity of renal insufficiency. She will need her potassium and renal function checked one week after discharge.  2. Paroxysmal Atrial Fibrillation - Patient remains in atrial fibrillation. Rate is controlled. Plan rate control and anticoagulation. Continue carvedilol. Continue apixaban 2.5 BID.   3. Severe Mitral Regurgitation - noted on echo this admission.  - No plans for further intervention. Patient and daughter want only conservative measures which is appropriate.  4. HTN - BP Controlled.  5. HLD - continue statin therapy  6. Type 2 DM - per admitting team  7. Acute on Chronic Stage 4 CKD -Follow-up nephrology following discharge. Needs potassium and renal function checked in one week.  Patient may be discharged from a cardiac standpoint. She can follow-up with me in 2-4 weeks.   Signed, Kirk Ruths , MD

## 2015-08-06 NOTE — Discharge Summary (Signed)
Discharge Summary  [name] ZOX:[id number] DOB: 13-Aug-1925  PCP: Alva Garnet., MD  Admit date: 08/02/2015 Discharge date: 08/06/2015  Time spent: <65mins  Recommendations for Outpatient Follow-up:  1. F/u with PMD within a week  for hospital discharge follow up, repeat bmp at follow up 2. F/u with cardiology Dr Jens Som in three weeks for afib/chf 3. New meds lasix/hydralazine and elliquis.   Discharge Diagnoses:  Active Hospital Problems   Diagnosis Date Noted  . Acute on chronic congestive heart failure (HCC)   . CHF exacerbation (HCC) 08/02/2015  . Elevated lactic acid level 08/02/2015  . Increased lactic acid level 08/02/2015  . Dyspnea 08/02/2015  . AKI (acute kidney injury) (HCC) 05/10/2015  . Atrial fibrillation with rapid ventricular response (HCC) 05/09/2015  . Diabetes mellitus without complication (HCC) 05/09/2015  . Essential hypertension, benign 05/09/2015    Resolved Hospital Problems   Diagnosis Date Noted Date Resolved  No resolved problems to display.    Discharge Condition: stable  Diet recommendation: heart healthy  Filed Weights   08/04/15 0443 08/05/15 0359 08/06/15 0320  Weight: 70.217 kg (154 lb 12.8 oz) 68.811 kg (151 lb 11.2 oz) 68.584 kg (151 lb 3.2 oz)    History of present illness:  [name] is a 82 y.o. female in the ED with severe shortness of breath and nausea. Patient had tightness in her chest with nausea yesterday. She had recurrent symptoms today. No coughing or fevers. No chest pain or palpitations. No dizziness.   Patient was hospitalized in December after falling at home and fracturing her right scapula and multiple right ribs. During that admission patient was also found to have new atrial fibrillation. She was started on a Cardizem drip, eventually weaned off and prescribed aspirin until outpatient cardiology follow-up . [name] Patient and family state that patient has never been treated for heart failure. She takes  Coreg for hypertension.   Hospital Course:  Active Problems:   Atrial fibrillation with rapid ventricular response (HCC)   Essential hypertension, benign   Diabetes mellitus without complication (HCC)   AKI (acute kidney injury) (HCC)   CHF exacerbation (HCC)   Elevated lactic acid level   Increased lactic acid level   Dyspnea   Acute on chronic congestive heart failure (HCC)  Acute respiratory failure with hypoxia most likely secondary to combined systolic and diastolic heart failure.  Was On non-rebreather in the ED. Now on 3 L which is her baseline. recieved IV lasix. , was not on lasix at home, she is discharged on lasix.   Acute on chronic combined systolic and diastolic congestive heart failure, possibly secondary to afib.[name] BNP 900s CXR >>CHF with effusions on admission, no formal diagnosis of chf before, not followed by cardiology prior to this admission.  - TSH normal at 2.26 in December, ekg with lbbb (from 2016), negative troponins, bnp elevated at 980. - ECHO with new reduced EF (The estimated ejection fraction was in the range of 35% to 40%. Dyskinesis of the anteroseptal  myocardium.) on lasix to 40 mg IV BID currentlyDaily weights 73---72--70, urine output 2liter in the last 24hrs -cardiology consulted, input appreciated, she is discharged on lasix, not a candidate for acei or arbs due to renal impairment  Atrial fibrillation with RVR.  Chadvasc 6. She presented in December with new onset Afib with RVR. Started on ASA at the time, probably wasn't candidate for anticoagulation secondary to falls. Was supposed to follow up with Cardiology as outpatient. Presenting rate 125, down to 103  after IV Metoprolol.  -Hr mildly elevated initially, home meds coreg dose increased from 12.5 bid to 25 mg bid -cardiology consulted And input appreciated, patient is started on apixaban.   Elevated lactic acid, WBC 12.4. Afebrile. Tachypnea secondary to acute respiratory failure.  Tachycardia secondary to Afib with RVR. Elevated lactic acid likely from acute respiratory failure. It has normalized  Acute renal failure, probably superimposed on CKD, stage IV. Creatinine 1.94. Cr was 2.09 during December admission. Prior to that, the only labs were from 2007 at which time renal function was normal.  - Renally dose medications - Minimize nephrotoxins and avoid ACEI/ARB -monitor on lasix.   Hypertension. Controlled.  -continue Coreg   Type II diabetes. Diet controlled -hgb a1c 6.2 -SSI - sensitive -carb modified diet  Code Status: DNR Family Communication; care discussed with patient and daughter in room Disposition Plan: discharge home on 3/10 , continue home oxygen. Patient did well with PT.   Consultants:  cardiology  Procedures:  ECHO  Antibiotics:  none   Discharge Exam: BP 115/56 mmHg  Pulse 82  Temp(Src) 98.3 F (36.8 C) (Oral)  Resp 18  Wt 68.584 kg (151 lb 3.2 oz)  SpO2 100%   General: NAD  Cardiovascular: IRRR  Respiratory: Bilateral crackles improving  Abdomen: Bs present, soft, nt  Musculoskeletal: trace edema resolving   Discharge Instructions You were cared for by a hospitalist during your hospital stay. If you have any questions about your discharge medications or the care you received while you were in the hospital after you are discharged, you can call the unit and asked to speak with the hospitalist on call if the hospitalist that took care of you is not available. Once you are discharged, your primary care physician will handle any further medical issues. Please note that NO REFILLS for any discharge medications will be authorized once you are discharged, as it is imperative that you return to your primary care physician (or establish a relationship with a primary care physician if you do not have one) for your aftercare needs so that they can reassess your need for medications and monitor your lab values.        Discharge Instructions    Diet - low sodium heart healthy    Complete by:  As directed      Increase activity slowly    Complete by:  As directed             Medication List    STOP taking these medications        aspirin 325 MG tablet      TAKE these medications        acetaminophen 500 MG tablet  Commonly known as:  TYLENOL  Take 1 tablet (500 mg total) by mouth 3 (three) times daily.     apixaban 2.5 MG Tabs tablet  Commonly known as:  ELIQUIS  Take 1 tablet (2.5 mg total) by mouth 2 (two) times daily.     ARTIFICIAL TEARS 0.1-0.3 % Soln  Apply 1 drop to eye 3 (three) times daily.     bisacodyl 10 MG suppository  Commonly known as:  DULCOLAX  Place 1 suppository (10 mg total) rectally every 12 (twelve) hours as needed for mild constipation or moderate constipation.     calcium carbonate 600 MG Tabs tablet  Commonly known as:  OS-CAL  Take 600 mg by mouth 2 (two) times daily with a meal.     carvedilol 25 MG tablet  Commonly  known as:  COREG  Take 25 mg by mouth every evening.     cholecalciferol 1000 units tablet  Commonly known as:  VITAMIN D  Take 2,000 Units by mouth daily.     ferrous sulfate 325 (65 FE) MG tablet  Take 325 mg by mouth 2 (two) times daily with a meal.     furosemide 40 MG tablet  Commonly known as:  LASIX  Take 1 tablet (40 mg total) by mouth 2 (two) times daily.     hydrALAZINE 10 MG tablet  Commonly known as:  APRESOLINE  Take 1 tablet (10 mg total) by mouth every 8 (eight) hours.     LUTEIN PO  Take 1 capsule by mouth daily.     MAGNESIUM-ZINC PO  Take 1 tablet by mouth daily.     multivitamin with minerals Tabs tablet  Take 1 tablet by mouth daily.     polyethylene glycol packet  Commonly known as:  MIRALAX / GLYCOLAX  Take 17 g by mouth 2 (two) times daily.     simvastatin 20 MG tablet  Commonly known as:  ZOCOR  Take 20 mg by mouth every evening.       Allergies  Allergen Reactions  . Codeine     "talks out  of her head"  . Oxycodone Other (See Comments)    confusion   Follow-up Information    Follow up with Olga Millers, MD. Go on 08/24/2015.   Specialty:  Cardiology   Why:  hospital discharge follow up for CHF/afib@ 8:45am   Contact information:   [address] [place] [address] [phone]       Follow up with Alva Garnet., MD In 1 week.   Specialty:  Internal Medicine   Why:  hospital discharge follow up, repeat bmp at follow up   Contact information:   [address] [place] [address] [phone]        The results of significant diagnostics from this hospitalization (including imaging, microbiology, ancillary and laboratory) are listed below for reference.    Significant Diagnostic Studies: Dg Chest 2 View  07/29/2015  CLINICAL DATA:  Cough for 1 month. EXAM: CHEST  2 VIEW COMPARISON:  May 12, 2015. FINDINGS: Stable cardiomegaly. No pneumothorax is noted. Minimal bilateral pleural effusions are noted. Of proximal left humerus is noted consistent with old fracture. Degenerative change of right glenohumeral joint is noted. Anterior osteophyte formation is noted in lower thoracic spine. Probable old upper lumbar vertebral body fracture is noted. IMPRESSION: Minimal bilateral pleural effusions. No consolidative process is noted. Electronically Signed   By: Lupita Raider, M.D.   On: 07/29/2015 15:11   Dg Chest Portable 1 View  08/02/2015  CLINICAL DATA:  Shortness of Breath EXAM: PORTABLE CHEST 1 VIEW COMPARISON:  07/29/2015 FINDINGS: Cardiac shadow remains enlarged. Increased vascular congestion with bilateral pleural effusions and bibasilar atelectatic changes is seen. No acute bony abnormality is noted. IMPRESSION: CHF with associated effusions. Electronically Signed   By: Alcide Clever M.D.   On: 08/02/2015 10:42    Microbiology: Recent Results (from the past 240 hour(s))  Urine culture     Status: None   Collection Time: 08/02/15  12:43 PM  Result Value Ref Range Status   Specimen Description URINE, CLEAN CATCH  Final   Special Requests NONE  Final   Culture MULTIPLE SPECIES PRESENT, SUGGEST RECOLLECTION  Final   Report Status 08/03/2015 FINAL  Final     Labs:  Basic Metabolic Panel:  Recent Labs Lab 08/02/15 0930 08/03/15 0503 08/04/15 0435 08/05/15 0501 08/06/15 0430  NA 142 142 142 137 136  K 4.8 4.1 3.5 3.2* 3.8  CL 112* 109 106 102 102  CO2 16* 20* 21* 22 23  GLUCOSE 179* 144* 130* 113* 113*  BUN 36* 40* 43* 43* 49*  CREATININE 1.77* 1.86* 1.97* 1.97* 2.10*  CALCIUM 9.9 9.8 9.6 9.1 8.9   Liver Function Tests: No results for input(s): AST, ALT, ALKPHOS, BILITOT, PROT, ALBUMIN in the last 168 hours. No results for input(s): LIPASE, AMYLASE in the last 168 hours. No results for input(s): AMMONIA in the last 168 hours. CBC:  Recent Labs Lab 08/02/15 0930 08/03/15 0503 08/04/15 0435  WBC 12.4* 8.5 8.2  HGB 12.7 11.4* 11.1*  HCT 38.2 33.0* 32.8*  MCV 88.8 88.0 87.2  PLT 253 234 220   Cardiac Enzymes: No results for input(s): CKTOTAL, CKMB, CKMBINDEX, TROPONINI in the last 168 hours. BNP: BNP (last 3 results)  Recent Labs  05/09/15 1815 05/11/15 0340 08/02/15 0930  BNP 587.2* 592.4* 983.8*    ProBNP (last 3 results) No results for input(s): PROBNP in the last 8760 hours.  CBG:  Recent Labs Lab 08/05/15 1109 08/05/15 1638 08/05/15 2101 08/06/15 0615 08/06/15 1108  GLUCAP 178* 95 120* 107* 183*       Signed:  Breelle Hollywood MD, PhD  [facility] 08/06/2015, 4:01 PM

## 2015-08-06 NOTE — Progress Notes (Signed)
Pt has orders to be discharged. Discharge instructions given and pt has no additional questions at this time. Medication regimen reviewed and pt educated. Pt verbalized understanding and has no additional questions. Telemetry box removed. IV removed and site in good condition. Pt stable and waiting for transportation.   Zanyah Lentsch RN 

## 2015-08-06 NOTE — Progress Notes (Signed)
Patient is new Eliquis; patient goes through the [place] ( [facility] [employer]) to get all of her prescriptions filled, per daughter the co pay is usually zero. Eliquis coupon card given to daughter with explanation of usage; Rolling walker ordered as requested and to be delivered to the room prior to discharging home today; [name] [phone]

## 2015-08-20 NOTE — Progress Notes (Signed)
HPI: Follow-up atrial fibrillation and congestive heart failure. Had fall 12/16. She had rib fractures and fracture of her right scapula. She was treated conservatively. She was noted to be in atrial fibrillation at that time. Echocardiogram revealed Ejection fraction 55-60%, mild aortic insufficiency and mild mitral regurgitation. She was treated with rate control and discharged. Reeadmitted 3/17 with CHF. Echocardiogram repeated and showed ejection fraction 35-40%, biatrial enlargement, moderate aortic insufficiency, severe mitral regurgitation and mild to moderate tricuspid regurgitation. BNP 963. Troponins negative. Patient requested only conservative measures given her age which we thought was appropriate. She was diuresed with improvement. Since last seen, Patient has mild dyspnea on exertion but much improved. No orthopnea, PND, pedal edema, chest pain, palpitations or syncope. No bleeding.  Current Outpatient Prescriptions  Medication Sig Dispense Refill  . acetaminophen (TYLENOL) 500 MG tablet Take 1 tablet (500 mg total) by mouth 3 (three) times daily. 30 tablet 0  . apixaban (ELIQUIS) 2.5 MG TABS tablet Take 1 tablet (2.5 mg total) by mouth 2 (two) times daily. 60 tablet 0  . ARTIFICIAL TEARS 0.1-0.3 % SOLN Apply 1 drop to eye 3 (three) times daily.    [name] b complex vitamins tablet Take 1 tablet by mouth daily.    . calcium carbonate (OS-CAL) 600 MG TABS Take 600 mg by mouth 2 (two) times daily with a meal.    . carvedilol (COREG) 25 MG tablet Take 25 mg by mouth every evening.    . cholecalciferol (VITAMIN D) 1000 UNITS tablet Take 2,000 Units by mouth daily.    . ferrous sulfate 325 (65 FE) MG tablet Take 325 mg by mouth 2 (two) times daily with a meal.     . furosemide (LASIX) 40 MG tablet Take 1 tablet (40 mg total) by mouth 2 (two) times daily. 60 tablet 0  . hydrALAZINE (APRESOLINE) 10 MG tablet Take 1 tablet (10 mg total) by mouth every 8 (eight) hours. 90 tablet 0  . LUTEIN  PO Take 1 capsule by mouth daily.    [name] MAGNESIUM-ZINC PO Take 1 tablet by mouth daily.    . Multiple Vitamin (MULTIVITAMIN WITH MINERALS) TABS Take 1 tablet by mouth daily.    . polyethylene glycol (MIRALAX / GLYCOLAX) packet Take 17 g by mouth 2 (two) times daily. 14 each 0  . simvastatin (ZOCOR) 20 MG tablet Take 20 mg by mouth every evening.     No current facility-administered medications for this visit.     Past Medical History  Diagnosis Date  . Diabetes mellitus without complication ([place])   . Hypertension   . Cancer [facility])     breast cancer  . PAF (paroxysmal atrial fibrillation) ([place])     a. diagnosed 04/2015 --> not placed on anticoagulation at that time due to recent fall  . HLD (hyperlipidemia)     Past Surgical History  Procedure Laterality Date  . Abdominal hysterectomy    . Joint replacement      knees  . Fracture surgery      L femur  . Breast lumpectomy      Social History   Social History  . Marital Status: Widowed    Spouse Name: N/A  . Number of Children: N/A  . Years of Education: N/A   Occupational History  . Not on file.   Social History Main Topics  . Smoking status: Never Smoker   . Smokeless tobacco: Not on file  . Alcohol Use: No  . Drug  Use: No  . Sexual Activity: No   Other Topics Concern  . Not on file   Social History Narrative    Family History  Problem Relation Age of Onset  . Coronary artery disease Father   . Stroke Mother     ROS: no fevers or chills, productive cough, hemoptysis, dysphasia, odynophagia, melena, hematochezia, dysuria, hematuria, rash, seizure activity, orthopnea, PND, pedal edema, claudication. Remaining systems are negative.  Physical Exam: Well-developed well-nourished in no acute distress.  Skin is warm and dry.  HEENT is normal.  Neck is supple.  Chest is clear to auscultation with normal expansion.  Cardiovascular exam is irregular Abdominal exam nontender or distended. No masses  palpated. Extremities show no edema. neuro grossly intact  ECG Atrial fibrillation at a rate of 96. PVCs or aberrantly conducted beat. Right axis deviation. IVCD. Inferior lateral T-wave inversion.

## 2015-08-24 ENCOUNTER — Ambulatory Visit (INDEPENDENT_AMBULATORY_CARE_PROVIDER_SITE_OTHER): Payer: Medicare Other | Admitting: Cardiology

## 2015-08-24 ENCOUNTER — Encounter: Payer: Self-pay | Admitting: Cardiology

## 2015-08-24 VITALS — BP 122/58 | HR 96 | Ht 63.0 in | Wt 152.0 lb

## 2015-08-24 DIAGNOSIS — I4891 Unspecified atrial fibrillation: Secondary | ICD-10-CM | POA: Diagnosis not present

## 2015-08-24 DIAGNOSIS — I5042 Chronic combined systolic (congestive) and diastolic (congestive) heart failure: Secondary | ICD-10-CM | POA: Diagnosis not present

## 2015-08-24 DIAGNOSIS — I10 Essential (primary) hypertension: Secondary | ICD-10-CM | POA: Diagnosis not present

## 2015-08-24 DIAGNOSIS — I340 Nonrheumatic mitral (valve) insufficiency: Secondary | ICD-10-CM

## 2015-08-24 DIAGNOSIS — I420 Dilated cardiomyopathy: Secondary | ICD-10-CM | POA: Insufficient documentation

## 2015-08-24 DIAGNOSIS — N179 Acute kidney failure, unspecified: Secondary | ICD-10-CM

## 2015-08-24 DIAGNOSIS — I509 Heart failure, unspecified: Secondary | ICD-10-CM | POA: Insufficient documentation

## 2015-08-24 HISTORY — DX: Heart failure, unspecified: I50.9

## 2015-08-24 HISTORY — DX: Nonrheumatic mitral (valve) insufficiency: I34.0

## 2015-08-24 HISTORY — DX: Dilated cardiomyopathy: I42.0

## 2015-08-24 LAB — CBC
HEMATOCRIT: 36.1 % (ref 36.0–46.0)
Hemoglobin: 11.9 g/dL — ABNORMAL LOW (ref 12.0–15.0)
MCH: 29.4 pg (ref 26.0–34.0)
MCHC: 33 g/dL (ref 30.0–36.0)
MCV: 89.1 fL (ref 78.0–100.0)
MPV: 9.7 fL (ref 8.6–12.4)
Platelets: 255 10*3/uL (ref 150–400)
RBC: 4.05 MIL/uL (ref 3.87–5.11)
RDW: 14.7 % (ref 11.5–15.5)
WBC: 11.2 10*3/uL — AB (ref 4.0–10.5)

## 2015-08-24 LAB — BASIC METABOLIC PANEL
BUN: 65 mg/dL — AB (ref 7–25)
CALCIUM: 9.5 mg/dL (ref 8.6–10.4)
CO2: 21 mmol/L (ref 20–31)
CREATININE: 2.61 mg/dL — AB (ref 0.60–0.88)
Chloride: 105 mmol/L (ref 98–110)
Glucose, Bld: 140 mg/dL — ABNORMAL HIGH (ref 65–99)
POTASSIUM: 4.6 mmol/L (ref 3.5–5.3)
Sodium: 138 mmol/L (ref 135–146)

## 2015-08-24 NOTE — Assessment & Plan Note (Signed)
Plan repeat potassium and renal function.

## 2015-08-24 NOTE — Assessment & Plan Note (Signed)
Patient interested only in conservative management with no procedures which I think is reasonable given her age. Plan medical therapy.

## 2015-08-24 NOTE — Assessment & Plan Note (Signed)
Patient remains in atrial fibrillation.Her rate is controlled. Continue carvedilol. Continue apixaban. Check hemoglobin and renal function.

## 2015-08-24 NOTE — Assessment & Plan Note (Signed)
Euvolemic on examination. Continue present dose of Lasix. Check potassium and renal function. We discussed low-sodium diet.

## 2015-08-24 NOTE — Assessment & Plan Note (Signed)
Blood pressure controlled. Continue present medications. 

## 2015-08-24 NOTE — Patient Instructions (Signed)
Medication Instructions:   NO CHANGE  Labwork:  Your physician recommends that you HAVE LAB WORK TODAY  Follow-Up:  Your physician recommends that you schedule a follow-up appointment in: 3 MONTHS WITH DR CRENSHAW      

## 2015-08-24 NOTE — Assessment & Plan Note (Signed)
Etiology unclear. May be secondary to valvular heart disease.Continue hydralazine and carvedilol. No ACE inhibitor given severity of renal insufficiency. She wants only conservative therapy with no testing. We will not pursue an ischemia evaluation.

## 2015-08-25 ENCOUNTER — Telehealth: Payer: Self-pay | Admitting: *Deleted

## 2015-08-25 DIAGNOSIS — N289 Disorder of kidney and ureter, unspecified: Secondary | ICD-10-CM

## 2015-08-25 MED ORDER — FUROSEMIDE 40 MG PO TABS: 40.0000 mg | ORAL_TABLET | Freq: Every day | ORAL | Status: DC

## 2015-08-25 NOTE — Telephone Encounter (Signed)
Spoke with pt and granddaughter, aware of medication change. Lab orders mailed to the pt

## 2015-08-25 NOTE — Telephone Encounter (Signed)
-----   Message from Lelon Perla, MD sent at 08/24/2015  4:02 PM EDT ----- Change lasix to 40 mg daily with additional 40 daily as needed; bmet one week [facility]

## 2015-08-30 ENCOUNTER — Telehealth: Payer: Self-pay | Admitting: Cardiology

## 2015-08-30 NOTE — Telephone Encounter (Signed)
Hr is racing at times.  Her stomach has been upset has loose stools today, no blood, stools are dark anyway due to iron.  If diarrhea continues she may be dehydrated and may need to come to ER for fluids, but she will take her pm coreg now.  BP is 158/85 , glucose was 149.

## 2015-09-05 ENCOUNTER — Encounter (HOSPITAL_COMMUNITY): Payer: Self-pay | Admitting: Emergency Medicine

## 2015-09-05 ENCOUNTER — Emergency Department (HOSPITAL_COMMUNITY)
Admission: EM | Admit: 2015-09-05 | Discharge: 2015-09-05 | Disposition: A | Discharge status: Home / Self Care | Payer: Medicare Other | Attending: Emergency Medicine | Admitting: Emergency Medicine

## 2015-09-05 DIAGNOSIS — Z79899 Other long term (current) drug therapy: Secondary | ICD-10-CM | POA: Insufficient documentation

## 2015-09-05 DIAGNOSIS — R009 Unspecified abnormalities of heart beat: Secondary | ICD-10-CM | POA: Diagnosis not present

## 2015-09-05 DIAGNOSIS — I10 Essential (primary) hypertension: Secondary | ICD-10-CM | POA: Diagnosis not present

## 2015-09-05 DIAGNOSIS — R3915 Urgency of urination: Secondary | ICD-10-CM | POA: Insufficient documentation

## 2015-09-05 DIAGNOSIS — E119 Type 2 diabetes mellitus without complications: Secondary | ICD-10-CM | POA: Diagnosis not present

## 2015-09-05 DIAGNOSIS — E785 Hyperlipidemia, unspecified: Secondary | ICD-10-CM | POA: Diagnosis not present

## 2015-09-05 DIAGNOSIS — Z853 Personal history of malignant neoplasm of breast: Secondary | ICD-10-CM | POA: Insufficient documentation

## 2015-09-05 DIAGNOSIS — Z7901 Long term (current) use of anticoagulants: Secondary | ICD-10-CM | POA: Insufficient documentation

## 2015-09-05 DIAGNOSIS — R300 Dysuria: Secondary | ICD-10-CM

## 2015-09-05 LAB — CBC
HEMATOCRIT: 35.6 % — AB (ref 36.0–46.0)
Hemoglobin: 12.1 g/dL (ref 12.0–15.0)
MCH: 29.2 pg (ref 26.0–34.0)
MCHC: 34 g/dL (ref 30.0–36.0)
MCV: 86 fL (ref 78.0–100.0)
PLATELETS: 200 10*3/uL (ref 150–400)
RBC: 4.14 MIL/uL (ref 3.87–5.11)
RDW: 13.8 % (ref 11.5–15.5)
WBC: 10.2 10*3/uL (ref 4.0–10.5)

## 2015-09-05 LAB — BASIC METABOLIC PANEL
Anion gap: 13 (ref 5–15)
BUN: 45 mg/dL — AB (ref 6–20)
CO2: 17 mmol/L — AB (ref 22–32)
Calcium: 10 mg/dL (ref 8.9–10.3)
Chloride: 104 mmol/L (ref 101–111)
Creatinine, Ser: 2.21 mg/dL — ABNORMAL HIGH (ref 0.44–1.00)
GFR calc Af Amer: 22 mL/min — ABNORMAL LOW (ref >60)
GFR, EST NON AFRICAN AMERICAN: 19 mL/min — AB (ref >60)
GLUCOSE: 142 mg/dL — AB (ref 65–99)
POTASSIUM: 4.2 mmol/L (ref 3.5–5.1)
Sodium: 134 mmol/L — ABNORMAL LOW (ref 135–145)

## 2015-09-05 LAB — URINALYSIS, ROUTINE W REFLEX MICROSCOPIC
BILIRUBIN URINE: NEGATIVE
Glucose, UA: NEGATIVE mg/dL
HGB URINE DIPSTICK: NEGATIVE
Ketones, ur: NEGATIVE mg/dL
Leukocytes, UA: NEGATIVE
NITRITE: NEGATIVE
PH: 5 (ref 5.0–8.0)
Protein, ur: 30 mg/dL — AB
SPECIFIC GRAVITY, URINE: 1.009 (ref 1.005–1.030)

## 2015-09-05 LAB — URINE MICROSCOPIC-ADD ON
BACTERIA UA: NONE SEEN
RBC / HPF: NONE SEEN RBC/hpf (ref 0–5)
WBC, UA: NONE SEEN WBC/hpf (ref 0–5)

## 2015-09-05 NOTE — Discharge Instructions (Signed)

## 2015-09-05 NOTE — ED Provider Notes (Signed)
CSN: [id number]     Arrival date & time 09/05/15  [id number] History   None    Chief Complaint  Patient presents with  . Dysuria   HPI The patient presents to the emergency room for evaluation of urinary urgency and a decreased ability to urinate properly. Patient states last night she noticed that every time she urinates she is only urinating a small trickle. This morning when she woke up she had the same sensation. She does not feel specifically that her bladder is distended or obstructed but she does not feel that she is urinating properly. It is not particularly painful to urinate. She denies any abdominal pain. She denies any fevers. She denies any trouble with any chest pain or shortness of breath. Past Medical History  Diagnosis Date  . Diabetes mellitus without complication ([place])   . Hypertension   . Cancer [facility])     breast cancer  . PAF (paroxysmal atrial fibrillation) ([place])     a. diagnosed 04/2015 --> not placed on anticoagulation at that time due to recent fall  . HLD (hyperlipidemia)    Past Surgical History  Procedure Laterality Date  . Abdominal hysterectomy    . Joint replacement      knees  . Fracture surgery      L femur  . Breast lumpectomy     Family History  Problem Relation Age of Onset  . Coronary artery disease Father   . Stroke Mother    Social History  Substance Use Topics  . Smoking status: Never Smoker   . Smokeless tobacco: None  . Alcohol Use: No   OB History    No data available     Review of Systems  All other systems reviewed and are negative.     Allergies  Codeine and Oxycodone  Home Medications   Prior to Admission medications   Medication Sig Start Date End Date Taking? Authorizing Provider  acetaminophen (TYLENOL) 500 MG tablet Take 1 tablet (500 mg total) by mouth 3 (three) times daily. 08/06/15   Florencia Reasons, MD  apixaban (ELIQUIS) 2.5 MG TABS tablet Take 1 tablet (2.5 mg total) by mouth 2 (two) times daily. 08/06/15   Florencia Reasons, MD   ARTIFICIAL TEARS 0.1-0.3 % SOLN Apply 1 drop to eye 3 (three) times daily.    Historical Provider, MD  b complex vitamins tablet Take 1 tablet by mouth daily.    Historical Provider, MD  calcium carbonate (OS-CAL) 600 MG TABS Take 600 mg by mouth 2 (two) times daily with a meal.    Historical Provider, MD  carvedilol (COREG) 25 MG tablet Take 25 mg by mouth every evening.    Historical Provider, MD  cholecalciferol (VITAMIN D) 1000 UNITS tablet Take 2,000 Units by mouth daily.    Historical Provider, MD  ferrous sulfate 325 (65 FE) MG tablet Take 325 mg by mouth 2 (two) times daily with a meal.     Historical Provider, MD  furosemide (LASIX) 40 MG tablet Take 1 tablet (40 mg total) by mouth daily. 08/25/15   Lelon Perla, MD  hydrALAZINE (APRESOLINE) 10 MG tablet Take 1 tablet (10 mg total) by mouth every 8 (eight) hours. 08/06/15   Florencia Reasons, MD  LUTEIN PO Take 1 capsule by mouth daily.    Historical Provider, MD  MAGNESIUM-ZINC PO Take 1 tablet by mouth daily.    Historical Provider, MD  Multiple Vitamin (MULTIVITAMIN WITH MINERALS) TABS Take 1 tablet by mouth daily.  Historical Provider, MD  polyethylene glycol (MIRALAX / GLYCOLAX) packet Take 17 g by mouth 2 (two) times daily. 05/13/15   Geradine Girt, DO  simvastatin (ZOCOR) 20 MG tablet Take 20 mg by mouth every evening.    Historical Provider, MD   BP 165/85 mmHg  Pulse 89  Temp(Src) 97.5 F (36.4 C) (Oral)  Resp 14  SpO2 95% Physical Exam  Constitutional: No distress.  HENT:  Head: Normocephalic and atraumatic.  Right Ear: External ear normal.  Left Ear: External ear normal.  Eyes: Conjunctivae are normal. Right eye exhibits no discharge. Left eye exhibits no discharge. No scleral icterus.  Neck: Neck supple. No tracheal deviation present.  Cardiovascular: Normal rate and intact distal pulses.  An irregularly irregular rhythm present.  Pulmonary/Chest: Effort normal and breath sounds normal. No stridor. No respiratory  distress. She has no wheezes. She has no rales.  Abdominal: Soft. Bowel sounds are normal. She exhibits no distension. There is no tenderness. There is no rebound and no guarding.  Ventral hernia soft, reducible, and not indurated or tender  Musculoskeletal: She exhibits no edema or tenderness.  Neurological: She is alert. She has normal strength. No cranial nerve deficit (no facial droop, extraocular movements intact, no slurred speech) or sensory deficit. She exhibits normal muscle tone. She displays no seizure activity. Coordination normal.  Skin: Skin is warm and dry. No rash noted. She is not diaphoretic.  Psychiatric: She has a normal mood and affect.  Nursing note and vitals reviewed.   ED Course  Procedures (including critical care time) 325 cc on urine catheterization  Labs Review Labs Reviewed  URINALYSIS, ROUTINE W REFLEX MICROSCOPIC (NOT AT [facility]) - Abnormal; Notable for the following:    Protein, ur 30 (*)    All other components within normal limits  CBC - Abnormal; Notable for the following:    HCT 35.6 (*)    All other components within normal limits  BASIC METABOLIC PANEL - Abnormal; Notable for the following:    Sodium 134 (*)    CO2 17 (*)    Glucose, Bld 142 (*)    BUN 45 (*)    Creatinine, Ser 2.21 (*)    GFR calc non Af Amer 19 (*)    GFR calc Af Amer 22 (*)    All other components within normal limits  URINE MICROSCOPIC-ADD ON - Abnormal; Notable for the following:    Squamous Epithelial / LPF 0-5 (*)    All other components within normal limits  URINE CULTURE    Imaging Review No results found. I have personally reviewed and evaluated these images and lab results as part of my medical decision-making.   EKG Interpretation   Date/Time:  Sunday September 05 2015 09:13:20 EDT Ventricular Rate:  94 PR Interval:    QRS Duration: 144 QT Interval:  371 QTC Calculation: 464 R Axis:   -11 Text Interpretation:  Atrial fibrillation Left bundle branch block  No  significant change since last tracing Confirmed by Sinda Leedom  MD-J, Elantra Caprara  ([id number]) on 09/05/2015 9:20:32 AM      MDM   Final diagnoses:  Dysuria    UA does not suggest a UTI.  Renal function is at baseline although bicarb is decreased with anion gap unchanged.  Likely related to chronic renal dysfunction but no emergent issues associated with that.  Catheterization did not show urinary retention.  Could be related to bladder spasms, she also has history of bladder prolapse.  Discussed outpatient follow  up.  No new meds at this time.    Dorie Rank, MD 09/05/15 1100

## 2015-09-05 NOTE — ED Notes (Signed)
Pt from home with c/o worsening ability to urinate last night.  Pt reports urge to urinate but inability to produce more than a "trickle."  Pt denies pain with urination or blood in urine.  NAD, A&O.

## 2015-09-06 ENCOUNTER — Telehealth: Payer: Self-pay | Admitting: Cardiology

## 2015-09-06 LAB — URINE CULTURE
Culture: NO GROWTH
Special Requests: NORMAL

## 2015-09-06 NOTE — Telephone Encounter (Signed)
New Message  Pt daughter called/ pt was recently in the hospital// Will need labs or will she still need them? . Please place orders and call back

## 2015-09-06 NOTE — Telephone Encounter (Signed)
No need for additional blood work [facility]

## 2015-09-06 NOTE — Telephone Encounter (Signed)
Pt's daughter called to see if they still needed to come in for f/u blood work. Pt was supposed to come 08/31/15 and was not able to come. Pt was in the ED yesterday and had BMET done. Told daughter that she did not need further blood work at this time.   Will defer to Dr Stanford Breed and Fredia Beets.

## 2015-09-07 ENCOUNTER — Other Ambulatory Visit: Payer: Self-pay | Admitting: *Deleted

## 2015-09-07 MED ORDER — HYDRALAZINE HCL 10 MG PO TABS: 10.0000 mg | ORAL_TABLET | Freq: Three times a day (TID) | ORAL | Status: DC

## 2015-09-07 MED ORDER — APIXABAN 2.5 MG PO TABS
2.5000 mg | ORAL_TABLET | Freq: Two times a day (BID) | ORAL | Status: DC
Start: 2015-09-07 — End: 2015-10-05

## 2015-09-07 NOTE — Telephone Encounter (Signed)
Rx request sent to pharmacy.  

## 2015-09-07 NOTE — Telephone Encounter (Signed)
Patients daughter requested a thirty day refill on eliquis and hydralazine be [facility]. She also requested a ninety day supply be sent to [facility].

## 2015-09-16 ENCOUNTER — Other Ambulatory Visit: Payer: Self-pay | Admitting: *Deleted

## 2015-09-16 DIAGNOSIS — N289 Disorder of kidney and ureter, unspecified: Secondary | ICD-10-CM

## 2015-09-16 MED ORDER — FUROSEMIDE 40 MG PO TABS: 40.0000 mg | ORAL_TABLET | Freq: Every day | ORAL | Status: DC

## 2015-09-16 NOTE — Telephone Encounter (Signed)
Rx refill sent to pharmacy. 

## 2015-09-16 NOTE — Telephone Encounter (Signed)
Per message from patient left on the refill voicemail she would like a ninety day furosemide rx sent to the [place] and a [facility].

## 2015-09-16 NOTE — Telephone Encounter (Signed)
Per call from patients daughter, patient is completely out of furosemide and the pharmacy still has not received the rx.

## 2015-09-17 ENCOUNTER — Telehealth: Payer: Self-pay | Admitting: *Deleted

## 2015-09-17 DIAGNOSIS — N289 Disorder of kidney and ureter, unspecified: Secondary | ICD-10-CM

## 2015-09-17 MED ORDER — FUROSEMIDE 40 MG PO TABS: 40.0000 mg | ORAL_TABLET | Freq: Every day | ORAL | Status: DC

## 2015-09-17 NOTE — Telephone Encounter (Signed)
Rx(s) sent to pharmacy electronically.  

## 2015-09-17 NOTE — Telephone Encounter (Signed)
Per message from patients daughter left on refill voicemail, she would like a thirty day [facility] and a [facility] day [facility]. She is out of medication. Please advise. Thanks, MI

## 2015-10-05 ENCOUNTER — Other Ambulatory Visit: Payer: Self-pay | Admitting: *Deleted

## 2015-10-05 MED ORDER — APIXABAN 2.5 MG PO TABS: 2.5000 mg | ORAL_TABLET | Freq: Two times a day (BID) | ORAL | Status: DC

## 2015-10-05 NOTE — Telephone Encounter (Signed)
Patients daughter Sharyn Lull, left a voicemail stating that she had requested an rx for eliquis be sent to [facility] and they never received it. She would like a call back at [phone] as the patient is almost out of medication.

## 2015-10-06 ENCOUNTER — Ambulatory Visit: Payer: Medicare Other | Admitting: Podiatry

## 2015-10-07 ENCOUNTER — Telehealth: Payer: Self-pay | Admitting: *Deleted

## 2015-10-07 MED ORDER — HYDRALAZINE HCL 10 MG PO TABS: 10.0000 mg | ORAL_TABLET | Freq: Three times a day (TID) | ORAL | Status: DC

## 2015-10-07 NOTE — Telephone Encounter (Signed)
Spoke with pt dtr, she does not need any prior authorization. She needs refill on hydralazine, refill sent to the pharmacy.

## 2015-10-07 NOTE — Telephone Encounter (Signed)
Received call from [place], pt's daughter, stating that a prior authorization was needed to cover patient's Eliquis 2.5mg  BID. Pt down to last pill today. Informed Sharyn Lull I have provided samples of medication at front desk and would send msg to Dr. Jacalyn Lefevre nurse to review/complete PA. She voiced thanks and will pick up samples today. Please call when PA approved.

## 2015-11-02 ENCOUNTER — Ambulatory Visit (INDEPENDENT_AMBULATORY_CARE_PROVIDER_SITE_OTHER): Payer: Medicare Other | Admitting: Podiatry

## 2015-11-02 ENCOUNTER — Encounter: Payer: Self-pay | Admitting: Podiatry

## 2015-11-02 DIAGNOSIS — B351 Tinea unguium: Secondary | ICD-10-CM | POA: Diagnosis not present

## 2015-11-02 DIAGNOSIS — M79676 Pain in unspecified toe(s): Secondary | ICD-10-CM | POA: Diagnosis not present

## 2015-11-02 DIAGNOSIS — E1140 Type 2 diabetes mellitus with diabetic neuropathy, unspecified: Secondary | ICD-10-CM

## 2015-11-02 DIAGNOSIS — L84 Corns and callosities: Secondary | ICD-10-CM | POA: Diagnosis not present

## 2015-11-02 NOTE — Progress Notes (Signed)
Patient ID: [name], female   DOB: 1925-10-12, 82 y.o.   MRN: [id number]   Expand All Collapse All   Patient ID: [name], female DOB: 1926-03-28, 82 y.o. MRN: [id number]  Subjective: This patient presents again with daughter present in the treatment room complaining of thickened and elongated toenails are cough or tingling shoes and requests toenail debridement. Also, patient complaining of uncomfortable callouses right foot.. Patient recovering from fall since the visit of 02/23/2015 a history of shoulder fracture and rib fracture  Objective: Orientated 3 No open skin lesions bilaterally The toenails are elongated, brittle, discolored, deformed, tender to palpation 6-10 Keratoses With bleeding plantar right first MPJ Bleeding callus distal third right toe  Assessment: Diabetic with a history neuropathy and angiopathy Symptomatic onychomycoses 6-10 Pre-ulcerative calluses 2  Plan: Debridement toenails 6-10 mechanically and electronically without a bleeding Debrided keratoses 2 without any bleeding Continue wearing diabetic shoes  Reappoint 3 months

## 2015-11-02 NOTE — Patient Instructions (Signed)
Diabetes and Foot Care Diabetes may cause you to have problems because of poor blood supply (circulation) to your feet and legs. This may cause the skin on your feet to become thinner, break easier, and heal more slowly. Your skin may become dry, and the skin may peel and crack. You may also have nerve damage in your legs and feet causing decreased feeling in them. You may not notice minor injuries to your feet that could lead to infections or more serious problems. Taking care of your feet is one of the most important things you can do for yourself.  HOME CARE INSTRUCTIONS  Wear shoes at all times, even in the house. Do not go barefoot. Bare feet are easily injured.  Check your feet daily for blisters, cuts, and redness. If you cannot see the bottom of your feet, use a mirror or ask someone for help.  Wash your feet with warm water (do not use hot water) and mild soap. Then pat your feet and the areas between your toes until they are completely dry. Do not soak your feet as this can dry your skin.  Apply a moisturizing lotion or petroleum jelly (that does not contain alcohol and is unscented) to the skin on your feet and to dry, brittle toenails. Do not apply lotion between your toes.  Trim your toenails straight across. Do not dig under them or around the cuticle. File the edges of your nails with an emery board or nail file.  Do not cut corns or calluses or try to remove them with medicine.  Wear clean socks or stockings every day. Make sure they are not too tight. Do not wear knee-high stockings since they may decrease blood flow to your legs.  Wear shoes that fit properly and have enough cushioning. To break in new shoes, wear them for just a few hours a day. This prevents you from injuring your feet. Always look in your shoes before you put them on to be sure there are no objects inside.  Do not cross your legs. This may decrease the blood flow to your feet.  If you find a minor scrape,  cut, or break in the skin on your feet, keep it and the skin around it clean and dry. These areas may be cleansed with mild soap and water. Do not cleanse the area with peroxide, alcohol, or iodine.  When you remove an adhesive bandage, be sure not to damage the skin around it.  If you have a wound, look at it several times a day to make sure it is healing.  Do not use heating pads or hot water bottles. They may burn your skin. If you have lost feeling in your feet or legs, you may not know it is happening until it is too late.  Make sure your health care provider performs a complete foot exam at least annually or more often if you have foot problems. Report any cuts, sores, or bruises to your health care provider immediately. SEEK MEDICAL CARE IF:   You have an injury that is not healing.  You have cuts or breaks in the skin.  You have an ingrown nail.  You notice redness on your legs or feet.  You feel burning or tingling in your legs or feet.  You have pain or cramps in your legs and feet.  Your legs or feet are numb.  Your feet always feel cold. SEEK IMMEDIATE MEDICAL CARE IF:   There is increasing redness,   swelling, or pain in or around a wound.  There is a red line that goes up your leg.  Pus is coming from a wound.  You develop a fever or as directed by your health care provider.  You notice a bad smell coming from an ulcer or wound.   This information is not intended to replace advice given to you by your health care provider. Make sure you discuss any questions you have with your health care provider.   Document Released: 05/12/2000 Document Revised: 01/15/2013 Document Reviewed: 10/22/2012 Elsevier Interactive Patient Education 2016 [employer]vier Inc.  

## 2015-11-15 ENCOUNTER — Other Ambulatory Visit: Payer: Self-pay | Admitting: *Deleted

## 2015-11-16 MED ORDER — APIXABAN 2.5 MG PO TABS: 2.5000 mg | ORAL_TABLET | Freq: Two times a day (BID) | ORAL | Status: DC

## 2015-11-23 ENCOUNTER — Ambulatory Visit: Payer: Medicare Other | Admitting: Cardiology

## 2015-12-01 ENCOUNTER — Ambulatory Visit
Admission: RE | Admit: 2015-12-01 | Discharge: 2015-12-01 | Disposition: A | Discharge status: Home / Self Care | Payer: Medicare Other | Source: Ambulatory Visit | Attending: Family | Admitting: Family

## 2015-12-01 ENCOUNTER — Other Ambulatory Visit: Payer: Self-pay | Admitting: Family

## 2015-12-01 DIAGNOSIS — R0602 Shortness of breath: Secondary | ICD-10-CM

## 2015-12-01 DIAGNOSIS — R221 Localized swelling, mass and lump, neck: Secondary | ICD-10-CM

## 2015-12-02 ENCOUNTER — Telehealth: Payer: Self-pay | Admitting: Cardiology

## 2015-12-02 ENCOUNTER — Other Ambulatory Visit: Payer: Self-pay | Admitting: Family

## 2015-12-02 DIAGNOSIS — R0602 Shortness of breath: Secondary | ICD-10-CM

## 2015-12-02 NOTE — Telephone Encounter (Signed)
Reviewed notes and findings from PCP visit yesterday, Dr. [name] recommended to have pt reseen in office, tomorrow preferred. Flex visits are full but discussed w Debra - Dr. Stanford Breed has [facility] spot and have added patient. I have called daughter of patient back and discussed recommendations including visit tomorrow. appt confirmed.

## 2015-12-02 NOTE — Telephone Encounter (Signed)
New message   Pt daughter is calling requesting a rn call her back her mother has not been feeling well  Pt is not having CP  Pt c/o Shortness Of Breath: STAT if SOB developed within the last 24 hours or pt is noticeably SOB on the phone  1. Are you currently SOB (can you hear that pt is SOB on the phone)? Pt daughter on phone  2. How long have you been experiencing SOB? 11/30/15  3. Are you SOB when sitting or when up moving around? Up moving around  4. Are you currently experiencing any other symptoms? Pt is not sleeping well, she is up all night

## 2015-12-02 NOTE — Telephone Encounter (Signed)
Pt w hx A Fib, CM, CHF, aortic and mitral valve insufficiency.  Pt has been feeling tired, daughter reports she has been up at night for past 2 nights. Pt had been c/o some SOB. Denies rapid HR, wt fluctuations, etc. Went to PCP office yesterday, saw Dr. Chales Abrahams PA Jackelyn Poling Smothers, re: fatigue, SOB, stomach bloating, indigestion. Notes ECG and CXR performed yesterday which were "OK". O2 was 96% on RA. Results of a BNP are pending.  Daughter wondering if pt is just having a rough couple days and not sleeping well. Reports she had relief w/ tums for indigestion. Not sure if the SOB needs further workup & calling for recommendations.  Pending fax from PCP - Will await reports, defer to Dr. [name] ([facility]) for any suggestions.

## 2015-12-02 NOTE — Telephone Encounter (Signed)
I think she should be seen in the office tomorrow MCr

## 2015-12-02 NOTE — Telephone Encounter (Signed)
I have received faxed OV notes from D Smothers, FNP who saw patient yesterday.

## 2015-12-03 ENCOUNTER — Ambulatory Visit (INDEPENDENT_AMBULATORY_CARE_PROVIDER_SITE_OTHER): Payer: Medicare Other | Admitting: Cardiology

## 2015-12-03 ENCOUNTER — Telehealth: Payer: Self-pay

## 2015-12-03 ENCOUNTER — Telehealth: Payer: Self-pay | Admitting: Cardiology

## 2015-12-03 ENCOUNTER — Encounter: Payer: Self-pay | Admitting: Cardiology

## 2015-12-03 VITALS — BP 132/70 | HR 96

## 2015-12-03 DIAGNOSIS — I340 Nonrheumatic mitral (valve) insufficiency: Secondary | ICD-10-CM | POA: Diagnosis not present

## 2015-12-03 DIAGNOSIS — I4891 Unspecified atrial fibrillation: Secondary | ICD-10-CM | POA: Diagnosis not present

## 2015-12-03 DIAGNOSIS — N289 Disorder of kidney and ureter, unspecified: Secondary | ICD-10-CM

## 2015-12-03 DIAGNOSIS — I5042 Chronic combined systolic (congestive) and diastolic (congestive) heart failure: Secondary | ICD-10-CM

## 2015-12-03 MED ORDER — DILTIAZEM HCL ER COATED BEADS 120 MG PO CP24: 120.0000 mg | ORAL_CAPSULE | Freq: Every day | ORAL | Status: DC

## 2015-12-03 MED ORDER — FUROSEMIDE 40 MG PO TABS: ORAL_TABLET | ORAL | Status: DC

## 2015-12-03 NOTE — Telephone Encounter (Signed)
Received a call from Thurston Pounds NP with [employer].She stated she saw pt 2 days ago for sob.Stated pt has appt with Dr.Crenshaw this afternoon and she wanted him to know he will be able to view pt's cxr and lab work in care everywhere.

## 2015-12-03 NOTE — Telephone Encounter (Signed)
°*  STAT* If patient is at the pharmacy, call can be transferred to refill team.   1. Which medications need to be refilled? (please list name of each medication and dose if known) Hyuralazine & Diltiavem 120mg  2. Which pharmacy/location (including street and city if local pharmacy) [facility] [address]  3. Do they need a 30 day or 90 day supply? [place]

## 2015-12-03 NOTE — Progress Notes (Signed)
HPI: Follow-up atrial fibrillation and congestive heart failure. Had fall 12/16. She had rib fractures and fracture of her right scapula. She was treated conservatively. She was noted to be in atrial fibrillation at that time. Echocardiogram revealed Ejection fraction 55-60%, mild aortic insufficiency and mild mitral regurgitation. She was treated with rate control and discharged. Reeadmitted 3/17 with CHF. Echocardiogram repeated and showed ejection fraction 35-40%, biatrial enlargement, moderate aortic insufficiency, severe mitral regurgitation and mild to moderate tricuspid regurgitation. BNP 963. Troponins negative. Patient requested only conservative measures given her age which we thought was appropriate. She was diuresed with improvement. Laboratories 12/02/2015 showed BUN 40 and creatinine 1.92. Hemoglobin 12.5 and white blood cell count 10.6.Since last seen, Over the last 3 days she has noticed increased dyspnea on exertion. She has had decreased sleep but denies PND or orthopnea. No pedal edema. Occasional chest tightness. No fevers or chills or productive cough.  Current Outpatient Prescriptions  Medication Sig Dispense Refill  . acetaminophen (TYLENOL) 500 MG tablet Take 1 tablet (500 mg total) by mouth 3 (three) times daily. 30 tablet 0  . apixaban (ELIQUIS) 2.5 MG TABS tablet Take 1 tablet (2.5 mg total) by mouth 2 (two) times daily. 60 tablet 5  . ARTIFICIAL TEARS 0.1-0.3 % SOLN Apply 1 drop to eye 3 (three) times daily.    [name] b complex vitamins tablet Take 1 tablet by mouth daily.    . calcium carbonate (OS-CAL) 600 MG TABS Take 600 mg by mouth 2 (two) times daily with a meal.    . carvedilol (COREG) 25 MG tablet Take 25 mg by mouth every evening.    . cholecalciferol (VITAMIN D) 1000 UNITS tablet Take 2,000 Units by mouth daily.    . ferrous sulfate 325 (65 FE) MG tablet Take 325 mg by mouth 2 (two) times daily with a meal.     . furosemide (LASIX) 40 MG tablet Take 1 tablet (40  mg total) by mouth daily. 90 tablet 3  . hydrALAZINE (APRESOLINE) 10 MG tablet Take 1 tablet (10 mg total) by mouth every 8 (eight) hours. 270 tablet 3  . LUTEIN PO Take 1 capsule by mouth daily.    [name] MAGNESIUM-ZINC PO Take 1 tablet by mouth daily.    . Multiple Vitamin (MULTIVITAMIN WITH MINERALS) TABS Take 1 tablet by mouth daily.    . ONGLYZA 2.5 MG TABS tablet Take 2.5 mg by mouth daily.  5  . polyethylene glycol (MIRALAX / GLYCOLAX) packet Take 17 g by mouth 2 (two) times daily. 14 each 0  . simvastatin (ZOCOR) 20 MG tablet Take 20 mg by mouth every evening.     No current facility-administered medications for this visit.     Past Medical History  Diagnosis Date  . Diabetes mellitus without complication ([place])   . Hypertension   . Cancer [facility])     breast cancer  . PAF (paroxysmal atrial fibrillation) ([place])     a. diagnosed 04/2015 --> not placed on anticoagulation at that time due to recent fall  . HLD (hyperlipidemia)     Past Surgical History  Procedure Laterality Date  . Abdominal hysterectomy    . Joint replacement      knees  . Fracture surgery      L femur  . Breast lumpectomy      Social History   Social History  . Marital Status: Widowed    Spouse Name: N/A  . Number of Children: N/A  .  Years of Education: N/A   Occupational History  . Not on file.   Social History Main Topics  . Smoking status: Never Smoker   . Smokeless tobacco: Not on file  . Alcohol Use: No  . Drug Use: No  . Sexual Activity: No   Other Topics Concern  . Not on file   Social History Narrative    Family History  Problem Relation Age of Onset  . Coronary artery disease Father   . Stroke Mother     ROS: no fevers or chills, productive cough, hemoptysis, dysphasia, odynophagia, melena, hematochezia, dysuria, hematuria, rash, seizure activity, orthopnea, PND, pedal edema, claudication. Remaining systems are negative.  Physical Exam: Well-developed frail in no acute  distress.  Skin is warm and dry.  HEENT is normal.  Neck is supple.  Chest is clear to auscultation with normal expansion.  Cardiovascular exam is irrregular, 3/6 systolic murmur apex abdominal exam nontender or distended. No masses palpated. Ventral hernia Extremities show no edema. neuro grossly intact  ECG 12/01/2015-atrial fibrillation at a rate of 101. Left bundle-branch block.  1 acute on chronic combined systolic and diastolic congestive heart failure-she is complaining of increased dyspnea on exertion. Not markedly volume overloaded on examination. Increase Lasix to 40 mg in the morning and 20 in the evening. Check potassium and renal function in 1 week. 2 permanent atrial fibrillation-rate is elevated and I wonder if this is contributing to dyspnea. Continue carvedilol. Add Cardizem CD 120 mg daily. Follow heart rate and adjust as needed. Continue apixaban. 3 Mitral regurgitation-patient is only interested in conservative measures given age. She does not want any procedures which I think is appropriate. 4 Cardiomyopathy-etiology unclear. May be secondary to tachycardia or valvular heart disease. Continue beta blocker. I am adding Cardizem and her blood pressure is not high. I will therefore discontinue her hydralazine. No ACE inhibitor given renal insufficiency. No further testing at her request. 5 hyperlipidemia-discontinue Zocor as we are adding a calcium blocker. 6 chronic stage IV kidney disease-follow renal function closely with increased dose diuretic.  Kirk Ruths, MD

## 2015-12-03 NOTE — Patient Instructions (Signed)
Medication Instructions:   STOP SIMVASTATIN  STOP HYDRALAZINE  INCREASE FUROSEMIDE TO 40 MG IN THE MORNING AND 20 MG IN THE AFTERNOON= 1 TABLET IN THE MORNING AND 1/2 TABLET IN THE AFTERNOON  START DILTIAZEM 120 MG ONCE DAILY  Labwork:  Your physician recommends that you return for lab work in:ONE WEEK  Follow-Up:  Your physician recommends that you schedule a follow-up appointment in: AS SCHEDULED  .

## 2015-12-03 NOTE — Telephone Encounter (Signed)
Rx(s) sent to pharmacy electronically. Hydralazine discontinued at 12/03/15 office visit

## 2015-12-06 ENCOUNTER — Ambulatory Visit
Admission: RE | Admit: 2015-12-06 | Discharge: 2015-12-06 | Disposition: A | Discharge status: Home / Self Care | Payer: Medicare Other | Source: Ambulatory Visit | Attending: Family | Admitting: Family

## 2015-12-06 DIAGNOSIS — R221 Localized swelling, mass and lump, neck: Secondary | ICD-10-CM

## 2015-12-08 ENCOUNTER — Telehealth: Payer: Self-pay | Admitting: Cardiology

## 2015-12-08 NOTE — Telephone Encounter (Signed)
paov to check HR. Kirk Ruths

## 2015-12-08 NOTE — Telephone Encounter (Signed)
Spoke with pt granddaughter, she is with the pt. They report the pt has not slept in 3 nights, she is not able to get comfortable, she is not sure if due to SOB. She is having episodes of SOB, usually with exertion, there has been no change in her SOB since the change in furosemide dose. Her 02% got down to 86% today, with oxygen it improved to 94%. Her bp today 154/84 and pulse is in the 70's at rest and increases to 80-90"s with exertion. Her weight is stable and there is no edema. Her hydralazine was recently stopped and she was placed on diltiazem 120 mg. The pt wonders if she is having a reaction to the diltiazem. Will forward for dr Stanford Breed review

## 2015-12-08 NOTE — Telephone Encounter (Signed)
New Message   Pt dtr calling to speak w/ rN- stated that pt is having symptoms from recent med change, stated pt has no energy- her O2 was 86 this morning- 7/12. Please call back and discuss.

## 2015-12-08 NOTE — Telephone Encounter (Signed)
Spoke with pt granddaughter, aware no changes in medications at this time. Follow up scheduled

## 2015-12-10 LAB — BASIC METABOLIC PANEL
BUN: 42 mg/dL — ABNORMAL HIGH (ref 7–25)
CALCIUM: 9.4 mg/dL (ref 8.6–10.4)
CO2: 25 mmol/L (ref 20–31)
CREATININE: 2.18 mg/dL — AB (ref 0.60–0.88)
Chloride: 102 mmol/L (ref 98–110)
Glucose, Bld: 132 mg/dL — ABNORMAL HIGH (ref 65–99)
Potassium: 3.8 mmol/L (ref 3.5–5.3)
SODIUM: 139 mmol/L (ref 135–146)

## 2015-12-14 NOTE — Progress Notes (Signed)
HPI: Follow-up atrial fibrillation and congestive heart failure. Had fall 12/16. She had rib fractures and fracture of her right scapula. She was treated conservatively. She was noted to be in atrial fibrillation at that time. Echocardiogram revealed Ejection fraction 55-60%, mild aortic insufficiency and mild mitral regurgitation. She was treated with rate control and discharged. Reeadmitted 3/17 with CHF. Echocardiogram repeated and showed ejection fraction 35-40%, biatrial enlargement, moderate aortic insufficiency, severe mitral regurgitation and mild to moderate tricuspid regurgitation. BNP 963. Troponins negative. Patient requested only conservative measures given her age which we thought was appropriate. She was diuresed with improvement. Laboratories 12/02/2015 showed BUN 40 and creatinine 1.92. Hemoglobin 12.5 and white blood cell count 10.6. Cardizem added at last office visit. Since last seen, Her dyspnea has improved. She denies orthopnea, PND, pedal edema, chest pain, palpitations, syncope or bleeding.  Current Outpatient Prescriptions  Medication Sig Dispense Refill  . acetaminophen (TYLENOL) 500 MG tablet Take 1 tablet (500 mg total) by mouth 3 (three) times daily. 30 tablet 0  . apixaban (ELIQUIS) 2.5 MG TABS tablet Take 1 tablet (2.5 mg total) by mouth 2 (two) times daily. 60 tablet 5  . ARTIFICIAL TEARS 0.1-0.3 % SOLN Apply 1 drop to eye 3 (three) times daily.    [name] b complex vitamins tablet Take 1 tablet by mouth daily.    . calcium carbonate (OS-CAL) 600 MG TABS Take 600 mg by mouth 2 (two) times daily with a meal.    . carvedilol (COREG) 25 MG tablet Take 25 mg by mouth every evening.    . cholecalciferol (VITAMIN D) 1000 UNITS tablet Take 2,000 Units by mouth daily.    [name] diltiazem (CARDIZEM CD) 120 MG 24 hr capsule Take 1 capsule (120 mg total) by mouth daily. 30 capsule 11  . ferrous sulfate 325 (65 FE) MG tablet Take 325 mg by mouth 2 (two) times daily with a meal.     .  furosemide (LASIX) 40 MG tablet Take 1 tablet in the morning and 1/2 tablet in the afternoon 135 tablet 3  . LUTEIN PO Take 1 capsule by mouth daily.    [name] MAGNESIUM-ZINC PO Take 1 tablet by mouth daily.    . Multiple Vitamin (MULTIVITAMIN WITH MINERALS) TABS Take 1 tablet by mouth daily.    . ONGLYZA 2.5 MG TABS tablet Take 2.5 mg by mouth daily.  5  . polyethylene glycol (MIRALAX / GLYCOLAX) packet Take 17 g by mouth 2 (two) times daily. 14 each 0   No current facility-administered medications for this visit.     Past Medical History  Diagnosis Date  . Diabetes mellitus without complication ([place])   . Hypertension   . Cancer [facility])     breast cancer  . PAF (paroxysmal atrial fibrillation) ([place])     a. diagnosed 04/2015 --> not placed on anticoagulation at that time due to recent fall  . HLD (hyperlipidemia)     Past Surgical History  Procedure Laterality Date  . Abdominal hysterectomy    . Joint replacement      knees  . Fracture surgery      L femur  . Breast lumpectomy      Social History   Social History  . Marital Status: Widowed    Spouse Name: N/A  . Number of Children: N/A  . Years of Education: N/A   Occupational History  . Not on file.   Social History Main Topics  . Smoking status: Never Smoker   .  Smokeless tobacco: Not on file  . Alcohol Use: No  . Drug Use: No  . Sexual Activity: No   Other Topics Concern  . Not on file   Social History Narrative    Family History  Problem Relation Age of Onset  . Coronary artery disease Father   . Stroke Mother     ROS: no fevers or chills, productive cough, hemoptysis, dysphasia, odynophagia, melena, hematochezia, dysuria, hematuria, rash, seizure activity, orthopnea, PND, pedal edema, claudication. Remaining systems are negative.  Physical Exam: Well-developed frail in no acute distress.  Skin is warm and dry.  HEENT is normal.  Neck is supple.  Chest is clear to auscultation with normal expansion.    Cardiovascular exam is irregular Abdominal exam nontender or distended. No masses palpated. Extremities show no edema. neuro grossly intact  A/P  1 chronic combined systolic and diastolic congestive heart failure-She is much improved compared to last office visit. She denies dyspnea and is not volume overloaded on exam. Continue present dose of diuretics. 2 permanent atrial fibrillation-Rate is much better. Continue carvedilol and Cardizem. Continue apixaban. 3 Mitral regurgitation-patient is only interested in conservative measures given age. She does not want any procedures which I think is appropriate. She is a blue. 4 Cardiomyopathy-etiology unclear. May be secondary to tachycardia or valvular heart disease. Continue beta blocker. No ACE inhibitor given renal insufficiency. No further testing at her request. I discontinued hydralazine at last office visit because of borderline blood pressure. 5 hyperlipidemia-Management per primary care. 6 chronic stage IV kidney disease-We will check potassium and renal function when she returns in 8 weeks.  Kirk Ruths, MD

## 2015-12-16 ENCOUNTER — Ambulatory Visit (INDEPENDENT_AMBULATORY_CARE_PROVIDER_SITE_OTHER): Payer: Medicare Other | Admitting: Cardiology

## 2015-12-16 ENCOUNTER — Encounter: Payer: Self-pay | Admitting: Cardiology

## 2015-12-16 VITALS — BP 134/56 | HR 73 | Ht 63.0 in | Wt 136.0 lb

## 2015-12-16 DIAGNOSIS — I482 Chronic atrial fibrillation, unspecified: Secondary | ICD-10-CM

## 2015-12-16 DIAGNOSIS — E785 Hyperlipidemia, unspecified: Secondary | ICD-10-CM

## 2015-12-16 DIAGNOSIS — I420 Dilated cardiomyopathy: Secondary | ICD-10-CM

## 2015-12-16 DIAGNOSIS — I340 Nonrheumatic mitral (valve) insufficiency: Secondary | ICD-10-CM | POA: Diagnosis not present

## 2015-12-16 DIAGNOSIS — I5042 Chronic combined systolic (congestive) and diastolic (congestive) heart failure: Secondary | ICD-10-CM | POA: Diagnosis not present

## 2015-12-16 NOTE — Patient Instructions (Signed)
Medication Instructions:   Your physician recommends that you continue on your current medications as directed. Please refer to the Current Medication list given to you today.    If you need a refill on your cardiac medications before your next appointment, please call your pharmacy.  Labwork: NONE ORDER TODAY    Testing/Procedures:  NONE ORDER TODAY   Follow-Up: IN 8 WEEKS WITH DR CRENSHAW    Any Other Special Instructions Will Be Listed Below (If Applicable).

## 2015-12-26 ENCOUNTER — Encounter (HOSPITAL_COMMUNITY): Payer: Self-pay | Admitting: Emergency Medicine

## 2015-12-26 ENCOUNTER — Emergency Department (HOSPITAL_COMMUNITY): Payer: Medicare Other

## 2015-12-26 ENCOUNTER — Emergency Department (HOSPITAL_COMMUNITY)
Admission: EM | Admit: 2015-12-26 | Discharge: 2015-12-26 | Disposition: A | Discharge status: Home / Self Care | Payer: Medicare Other | Attending: Emergency Medicine | Admitting: Emergency Medicine

## 2015-12-26 DIAGNOSIS — Z7984 Long term (current) use of oral hypoglycemic drugs: Secondary | ICD-10-CM | POA: Diagnosis not present

## 2015-12-26 DIAGNOSIS — E119 Type 2 diabetes mellitus without complications: Secondary | ICD-10-CM | POA: Diagnosis not present

## 2015-12-26 DIAGNOSIS — Z853 Personal history of malignant neoplasm of breast: Secondary | ICD-10-CM | POA: Insufficient documentation

## 2015-12-26 DIAGNOSIS — I509 Heart failure, unspecified: Secondary | ICD-10-CM | POA: Diagnosis not present

## 2015-12-26 DIAGNOSIS — I482 Chronic atrial fibrillation, unspecified: Secondary | ICD-10-CM

## 2015-12-26 DIAGNOSIS — I10 Essential (primary) hypertension: Secondary | ICD-10-CM | POA: Insufficient documentation

## 2015-12-26 DIAGNOSIS — Z7901 Long term (current) use of anticoagulants: Secondary | ICD-10-CM | POA: Diagnosis not present

## 2015-12-26 DIAGNOSIS — Z79899 Other long term (current) drug therapy: Secondary | ICD-10-CM | POA: Insufficient documentation

## 2015-12-26 DIAGNOSIS — I4891 Unspecified atrial fibrillation: Secondary | ICD-10-CM | POA: Diagnosis present

## 2015-12-26 LAB — URINALYSIS, ROUTINE W REFLEX MICROSCOPIC
BILIRUBIN URINE: NEGATIVE
Glucose, UA: NEGATIVE mg/dL
Hgb urine dipstick: NEGATIVE
Ketones, ur: NEGATIVE mg/dL
Leukocytes, UA: NEGATIVE
NITRITE: NEGATIVE
PH: 6 (ref 5.0–8.0)
Protein, ur: 100 mg/dL — AB
SPECIFIC GRAVITY, URINE: 1.017 (ref 1.005–1.030)

## 2015-12-26 LAB — CBC
HCT: 39.4 % (ref 36.0–46.0)
HEMOGLOBIN: 13.4 g/dL (ref 12.0–15.0)
MCH: 30.1 pg (ref 26.0–34.0)
MCHC: 34 g/dL (ref 30.0–36.0)
MCV: 88.5 fL (ref 78.0–100.0)
PLATELETS: 232 10*3/uL (ref 150–400)
RBC: 4.45 MIL/uL (ref 3.87–5.11)
RDW: 14.4 % (ref 11.5–15.5)
WBC: 12.4 10*3/uL — ABNORMAL HIGH (ref 4.0–10.5)

## 2015-12-26 LAB — BASIC METABOLIC PANEL
Anion gap: 11 (ref 5–15)
BUN: 40 mg/dL — ABNORMAL HIGH (ref 6–20)
CALCIUM: 10.1 mg/dL (ref 8.9–10.3)
CHLORIDE: 99 mmol/L — AB (ref 101–111)
CO2: 24 mmol/L (ref 22–32)
CREATININE: 1.96 mg/dL — AB (ref 0.44–1.00)
GFR calc Af Amer: 25 mL/min — ABNORMAL LOW (ref >60)
GFR calc non Af Amer: 22 mL/min — ABNORMAL LOW (ref >60)
GLUCOSE: 138 mg/dL — AB (ref 65–99)
Potassium: 3.9 mmol/L (ref 3.5–5.1)
Sodium: 134 mmol/L — ABNORMAL LOW (ref 135–145)

## 2015-12-26 LAB — I-STAT TROPONIN, ED: Troponin i, poc: 0.03 ng/mL (ref 0.00–0.08)

## 2015-12-26 LAB — URINE MICROSCOPIC-ADD ON
RBC / HPF: NONE SEEN RBC/hpf (ref 0–5)
WBC UA: NONE SEEN WBC/hpf (ref 0–5)

## 2015-12-26 LAB — BRAIN NATRIURETIC PEPTIDE: B NATRIURETIC PEPTIDE 5: 1680.4 pg/mL — AB (ref 0.0–100.0)

## 2015-12-26 MED ORDER — DILTIAZEM HCL 25 MG/5ML IV SOLN
10.0000 mg | Freq: Once | INTRAVENOUS | Status: AC
  Administered 2015-12-26: 10 mg via INTRAVENOUS
  Filled 2015-12-26: qty 5

## 2015-12-26 MED ORDER — FUROSEMIDE 40 MG PO TABS: 40.0000 mg | ORAL_TABLET | Freq: Two times a day (BID) | ORAL | 0 refills | Status: DC

## 2015-12-26 MED ORDER — FUROSEMIDE 10 MG/ML IJ SOLN
80.0000 mg | Freq: Once | INTRAMUSCULAR | Status: AC
  Administered 2015-12-26: 80 mg via INTRAVENOUS
  Filled 2015-12-26: qty 8

## 2015-12-26 MED ORDER — ACETAMINOPHEN 325 MG PO TABS
650.0000 mg | ORAL_TABLET | Freq: Once | ORAL | Status: AC
  Administered 2015-12-26: 650 mg via ORAL
  Filled 2015-12-26: qty 2

## 2015-12-26 NOTE — ED Notes (Signed)
Patient Alert and oriented X4. Stable and ambulatory. Patient verbalized understanding of the discharge instructions.  Patient belongings were taken by the patient.  

## 2015-12-26 NOTE — Discharge Instructions (Signed)
Increase your Lasix to 40 mg morning, and 40 mg afternoon.  Your kidney function is at its baseline. You should have your kidney function labs rechecked within a week.  Return at anytime you feel your condition is worse including shortness of breath dizziness lightheadedness or other new or worsening symptoms  Call Dr. Stanford Breed for recheck visit this week

## 2015-12-26 NOTE — ED Triage Notes (Signed)
PT up to [facility]  With help .

## 2015-12-26 NOTE — ED Notes (Signed)
Placed patient on 2L nasal cannula as saturations dropped to 90%  Will continue to monitor

## 2015-12-26 NOTE — ED Triage Notes (Addendum)
Patient here from [facility] for further evaluation of exertional shortness of breath and recurrent atrial fib, per daughter patient has hx of same. Denies CP. States that she has increased anxiety and didn't rest at all last night. Alert and oriented

## 2015-12-27 NOTE — ED Provider Notes (Addendum)
[place] DEPT Provider Note   CSN: [id number] Arrival date & time: 12/26/15  1507  First Provider Contact:  16:00pm       History   Chief Complaint Chief Complaint  Patient presents with  . Atrial Fibrillation    HPI [name] is a 82 y.o. female.  She has a history of permanent A. fib. And congestive heart failure. Per the patient, and per the notes of her cardiologist she has preferred minimalist approach. She did not want specific testing or K[place]e Ciavarella interventions. Is controlled medically with rate control, Xarelto, and Lasix.  She reports mild increase in symptoms over the last few weeks. She had her Lasix decreased several weeks ago. Her symptoms seemed to start then. She's not had fever. She's not had UTI symptoms. She's not had chest pain.  HPI  Past Medical History:  Diagnosis Date  . Cancer [facility])    breast cancer  . Diabetes mellitus without complication ([place])   . HLD (hyperlipidemia)   . Hypertension   . PAF (paroxysmal atrial fibrillation) ([place])    a. diagnosed 04/2015 --> not placed on anticoagulation at that time due to recent fall    Patient Active Problem List   Diagnosis Date Noted  . Mitral regurgitation 08/24/2015  . Congestive dilated cardiomyopathy ([place]) 08/24/2015  . Congestive heart failure (CHF) ([place]) 08/24/2015  . Acute on chronic congestive heart failure ([place])   . CHF exacerbation ([place]) 08/02/2015  . Elevated lactic acid level 08/02/2015  . Increased lactic acid level 08/02/2015  . Dyspnea 08/02/2015  . AKI (acute kidney injury) ([place]) 05/10/2015  .  Rib fractures - right #8-10 05/09/2015  . Atrial fibrillation with rapid ventricular response ([place]) 05/09/2015  . Scapula fracture 05/09/2015  . Fall 05/09/2015  . Essential hypertension, benign 05/09/2015  . Diabetes mellitus without complication ([place]) [id number]  . History of breast cancer 05/09/2015    Past Surgical History:  Procedure Laterality Date  . ABDOMINAL HYSTERECTOMY      . BREAST LUMPECTOMY    . FRACTURE SURGERY     L femur  . JOINT REPLACEMENT     knees    OB History    No data available       Home Medications    Prior to Admission medications   Medication Sig Start Date End Date Taking? Authorizing Provider  acetaminophen (TYLENOL) 500 MG tablet Take 1 tablet (500 mg total) by mouth 3 (three) times daily. Patient taking differently: Take 1,000 mg by mouth 3 (three) times daily as needed for headache (pain).  08/06/15  Yes Florencia Reasons, MD  apixaban (ELIQUIS) 2.5 MG TABS tablet Take 1 tablet (2.5 mg total) by mouth 2 (two) times daily. 11/16/15  Yes Lelon Perla, MD  ARTIFICIAL TEARS 0.1-0.3 % SOLN Place 1 drop into both eyes 2 (two) times daily.    Yes Historical Provider, MD  calcium carbonate (OS-CAL) 600 MG TABS Take 600 mg by mouth 2 (two) times daily with a meal.   Yes Historical Provider, MD  carvedilol (COREG) 25 MG tablet Take 25 mg by mouth at bedtime.    Yes Historical Provider, MD  Cholecalciferol (VITAMIN D) 2000 units tablet Take 2,000 Units by mouth daily.   Yes Historical Provider, MD  diltiazem (CARDIZEM CD) 120 MG 24 hr capsule Take 1 capsule (120 mg total) by mouth daily. 12/03/15  Yes Lelon Perla, MD  ferrous sulfate 325 (65 FE) MG tablet Take 325 mg by mouth 2 (two) times daily with  a meal.    Yes Historical Provider, MD  LUTEIN PO Take 25 mg by mouth daily.    Yes Historical Provider, MD  Melatonin 3 MG TABS Take 3 mg by mouth at bedtime.   Yes Historical Provider, MD  Omega-3 Fatty Acids (FISH OIL PO) Take 1 capsule by mouth daily.   Yes Historical Provider, MD  OXYGEN Inhale 3 L into the lungs See admin instructions. Use every night at bedtime and during the day if needed for shortness of breath   Yes Historical Provider, MD  polyethylene glycol (MIRALAX / GLYCOLAX) packet Take 17 g by mouth 2 (two) times daily. Patient taking differently: Take 17 g by mouth daily as needed (constipation).  05/13/15  Yes Jessica U Vann, DO   Resveratrol 250 MG CAPS Take 250 mg by mouth daily.   Yes Historical Provider, MD  saxagliptin HCl (ONGLYZA) 2.5 MG TABS tablet Take 2.5 mg by mouth daily.   Yes Historical Provider, MD  temazepam (RESTORIL) 7.5 MG capsule Take 7.5 mg by mouth at bedtime as needed for sleep.  12/08/15  Yes Historical Provider, MD  furosemide (LASIX) 40 MG tablet Take 1 tablet (40 mg total) by mouth 2 (two) times daily. 12/26/15   Tanna Furry, MD    Family History Family History  Problem Relation Age of Onset  . Coronary artery disease Father   . Stroke Mother     Social History Social History  Substance Use Topics  . Smoking status: Never Smoker  . Smokeless tobacco: Never Used  . Alcohol use No     Allergies   Codeine and Oxycodone   Review of Systems Review of Systems  Constitutional: Negative for appetite change, chills, diaphoresis, fatigue and fever.  HENT: Negative for mouth sores, sore throat and trouble swallowing.   Eyes: Negative for visual disturbance.  Respiratory: Positive for cough and shortness of breath. Negative for chest tightness and wheezing.   Cardiovascular: Negative for chest pain.  Gastrointestinal: Negative for abdominal distention, abdominal pain, diarrhea, nausea and vomiting.  Endocrine: Negative for polydipsia, polyphagia and polyuria.  Genitourinary: Negative for dysuria, frequency and hematuria.  Musculoskeletal: Negative for gait problem.  Skin: Negative for color change, pallor and rash.  Neurological: Negative for dizziness, syncope, light-headedness and headaches.  Hematological: Does not bruise/bleed easily.  Psychiatric/Behavioral: Negative for behavioral problems and confusion.     Physical Exam Updated Vital Signs BP 138/80   Pulse 92   Temp 97.7 F (36.5 C) (Oral)   Resp 26   Ht 5\' 3"  (1.6 m)   Wt 133 lb (60.3 kg)   SpO2 95%   BMI 23.56 kg/m   Physical Exam  Constitutional: She is oriented to person, place, and time. She appears  well-developed and well-nourished. No distress.  HENT:  Head: Normocephalic.  Eyes: Conjunctivae are normal. Pupils are equal, round, and reactive to light. No scleral icterus.  Neck: Normal range of motion. Neck supple. No thyromegaly present.  Cardiovascular: Normal rate and regular rhythm.  Exam reveals no gallop and no friction rub.   No murmur heard. Pulmonary/Chest: Effort normal. No accessory muscle usage. No respiratory distress. She has decreased breath sounds in the right lower field and the left lower field. She has no wheezes. She has no rales.  Abdominal: Soft. Bowel sounds are normal. She exhibits no distension. There is no tenderness. There is no rebound.  Musculoskeletal: Normal range of motion.  Neurological: She is alert and oriented to person, place, and time.  Skin: Skin is warm and dry. No rash noted.  Psychiatric: She has a normal mood and affect. Her behavior is normal.     ED Treatments / Results  Labs (all labs ordered are listed, but only abnormal results are displayed) Labs Reviewed  BASIC METABOLIC PANEL - Abnormal; Notable for the following:       Result Value   Sodium 134 (*)    Chloride 99 (*)    Glucose, Bld 138 (*)    BUN 40 (*)    Creatinine, Ser 1.96 (*)    GFR calc non Af Amer 22 (*)    GFR calc Af Amer 25 (*)    All other components within normal limits  CBC - Abnormal; Notable for the following:    WBC 12.4 (*)    All other components within normal limits  BRAIN NATRIURETIC PEPTIDE - Abnormal; Notable for the following:    B Natriuretic Peptide 1,680.4 (*)    All other components within normal limits  URINALYSIS, ROUTINE W REFLEX MICROSCOPIC (NOT AT [facility]) - Abnormal; Notable for the following:    Protein, ur 100 (*)    All other components within normal limits  URINE MICROSCOPIC-ADD ON - Abnormal; Notable for the following:    Squamous Epithelial / LPF 0-5 (*)    Bacteria, UA RARE (*)    All other components within normal limits  I-STAT  TROPOININ, ED    EKG  EKG Interpretation None       Radiology Dg Chest 2 View  Result Date: 12/26/2015 CLINICAL DATA:  Shortness of breath EXAM: CHEST  2 VIEW COMPARISON:  12/01/2015 FINDINGS: Cardiomegaly with vascular congestion. Bibasilar atelectasis with small effusions. No acute bony abnormality. Degenerative changes in the thoracic spine and shoulders. Severe chronic compression fracture in the low upper lumbar spine. Aortic atherosclerosis. IMPRESSION: Cardiomegaly with vascular congestion. Bibasilar atelectasis and small effusions. Stable old severe compression fracture in the lumbar spine. Electronically Signed   By: Rolm Baptise M.D.   On: 12/26/2015 15:47   Procedures Procedures (including critical care time)  Medications Ordered in ED Medications  furosemide (LASIX) injection 80 mg (80 mg Intravenous Given 12/26/15 1824)  diltiazem (CARDIZEM) injection 10 mg (10 mg Intravenous Given 12/26/15 1821)  acetaminophen (TYLENOL) tablet 650 mg (650 mg Oral Given 12/26/15 1820)     Initial Impression / Assessment and Plan / ED Course  I have reviewed the triage vital signs and the nursing notes.  Pertinent labs & imaging results that were available during my care of the patient were reviewed by me and considered in my medical decision making (see chart for details).  Clinical Course   Shared decision making initiated with family and patient. Constellation of findings would suggest a mild congestive heart failure exacerbation. Initially had some tachypnea with conversation. I offered admission. The family would like to have a discussion about whether or not she would want to stay in the hospital or not. I offered some symptomatic treatment here. Was given some Cardizem for control of her rate which was only 105. Was given IV Lasix and diuresis over liter. On reexam she feels much better. Again offered admission and family politely declines, And he'll that she would be much more  comfortable at home. I told her that she can return at any time she feels she is not doing well for further consideration for admission. Her creatinine is 1.96. Asked him to increase her Lasix for next 5 days and recheck with her primary  care physician for exam, and repeat renal function testing.  Final Clinical Impressions(s) / ED Diagnoses   Final diagnoses:  Chronic atrial fibrillation (HCC)  Congestive heart failure, unspecified congestive heart failure chronicity, unspecified congestive heart failure type [facility]    New Prescriptions Discharge Medication List as of 12/26/2015  8:24 PM       Tanna Furry, MD 12/27/15 0122    Tanna Furry, MD 12/27/15 [phone]

## 2015-12-31 ENCOUNTER — Ambulatory Visit (INDEPENDENT_AMBULATORY_CARE_PROVIDER_SITE_OTHER): Payer: Medicare Other | Admitting: Nurse Practitioner

## 2015-12-31 ENCOUNTER — Encounter: Payer: Self-pay | Admitting: Nurse Practitioner

## 2015-12-31 VITALS — BP 130/62 | HR 77 | Ht 63.0 in | Wt 133.0 lb

## 2015-12-31 DIAGNOSIS — I480 Paroxysmal atrial fibrillation: Secondary | ICD-10-CM | POA: Insufficient documentation

## 2015-12-31 DIAGNOSIS — I481 Persistent atrial fibrillation: Secondary | ICD-10-CM

## 2015-12-31 DIAGNOSIS — I5022 Chronic systolic (congestive) heart failure: Secondary | ICD-10-CM

## 2015-12-31 DIAGNOSIS — I4821 Permanent atrial fibrillation: Secondary | ICD-10-CM | POA: Insufficient documentation

## 2015-12-31 DIAGNOSIS — I4819 Other persistent atrial fibrillation: Secondary | ICD-10-CM

## 2015-12-31 DIAGNOSIS — I340 Nonrheumatic mitral (valve) insufficiency: Secondary | ICD-10-CM

## 2015-12-31 DIAGNOSIS — I10 Essential (primary) hypertension: Secondary | ICD-10-CM | POA: Diagnosis not present

## 2015-12-31 DIAGNOSIS — I420 Dilated cardiomyopathy: Secondary | ICD-10-CM

## 2015-12-31 DIAGNOSIS — E785 Hyperlipidemia, unspecified: Secondary | ICD-10-CM | POA: Insufficient documentation

## 2015-12-31 DIAGNOSIS — I429 Cardiomyopathy, unspecified: Secondary | ICD-10-CM | POA: Insufficient documentation

## 2015-12-31 DIAGNOSIS — N184 Chronic kidney disease, stage 4 (severe): Secondary | ICD-10-CM

## 2015-12-31 NOTE — Patient Instructions (Signed)
Ignacia Bayley, NP, recommends that you schedule a follow-up appointment in 2 months with Dr Stanford Breed.  If you need a refill on your cardiac medications before your next appointment, please call your pharmacy.

## 2015-12-31 NOTE — Progress Notes (Signed)
Office Visit    Patient Name: [name] Date of Encounter: 12/31/2015  Primary Care Provider:  Phineas Inches, MD Primary Cardiologist:  B. Stanford Breed, MD   Chief Complaint    A 82 year old female with a history of atrial fibrillation, hypertension, and cardiomyopathy, who presents for evaluation following recent ER visit related to dyspnea and mild heart failure.  Past Medical History    Past Medical History:  Diagnosis Date  . Cancer [facility])    breast cancer  . Cardiomyopathy ([place])    a. 04/2015 Echo: EF 55-60%;  b. 07/2015 Echo: EF 35-40%, mod LVH, antsep DK, mod AI, sev MR, sev dil LA, mildly dil RA, mild-mod TR, PASP 57mmHg-->conservatively managed.  . Chronic systolic CHF (congestive heart failure) ([place])    a. 07/2015 Echo: EF 35-40%.  . CKD (chronic kidney disease), stage IV ([place])   . Diabetes mellitus without complication ([place])   . Essential hypertension   . HLD (hyperlipidemia)   . Permanent atrial fibrillation ([place])    a. diagnosed 04/2015 --> conservative mgmt with rate control and xarelto.  . Pulsatile neck mass    a. 11/2015 Neck U/S: pulsatile R neck mass correlates w/ a toruous and mildy ectatic segment of the R SCA measuring ~ 2.1 cm in max diameter - no change since noted on 12/16 CT.  [name] Severe mitral regurgitation by prior echocardiogram    a. 07/2015 sev MR   Past Surgical History:  Procedure Laterality Date  . ABDOMINAL HYSTERECTOMY    . BREAST LUMPECTOMY    . FRACTURE SURGERY     L femur  . JOINT REPLACEMENT     knees    Allergies  Allergies  Allergen Reactions  . Codeine     "talks out of her head"  . Oxycodone Other (See Comments)    confusion    History of Present Illness    An 82 year old female with the above complex past medical history. In December 2016, she fell and fractured ribs and also her right scapula. She was treated conservatively but was also noted be in atrial fibrillation at that time. Echo showed normal LV function. She  was treated with rate control and initially, anticoagulation was avoided in the setting of fall. She was readmitted in March 2017 with heart failure and at that time, echo showed an EF of 35-40% with severe mitral regurgitation. She was diuresed and after discussion with patient and family, conservative measures were continued. She was placed on xarelto and was also taking Lasix 40 mg twice a day. Lasix dose was dropped to 40 mg daily in March secondary to worsening renal function but then increased to 40 mg in the a.m. and 20 mg in the p.m. and early July in the setting of dyspnea. She had improvement in dyspnea on that dose but over a three-day period in late July, she noted difficulty sleeping and restlessness. She was not necessarily orthopneic. She did develop some dyspnea on exertion. On July 30, she was seen in the ED with worsening dyspnea. She was given IV Lasix in the ED with good diuresis and improvement in symptoms. She was offered admission, however she and her family wished for discharge and she was felt to be stable. She was advised to increase her Lasix back to 40 mg twice a day for 5 days and since doing so, she feels significantly better. Her weight on her home scale is stable at 131 pounds. She thinks is down about 3 pounds. Per  our records, she is down 3 pounds since her ER visit. She has not been having any chest pain, dyspnea, PND, orthopnea, dizziness, syncope, edema, or early satiety. She is careful with her sodium intake and is avoiding processed and restaurant foods. She often eats tomato sandwiches but does not add salt.  Home Medications    Prior to Admission medications   Medication Sig Start Date End Date Taking? Authorizing Provider  acetaminophen (TYLENOL) 500 MG tablet Take 1 tablet (500 mg total) by mouth 3 (three) times daily. Patient taking differently: Take 1,000 mg by mouth 3 (three) times daily as needed for headache (pain).  08/06/15   Florencia Reasons, MD  apixaban (ELIQUIS)  2.5 MG TABS tablet Take 1 tablet (2.5 mg total) by mouth 2 (two) times daily. 11/16/15   Lelon Perla, MD  ARTIFICIAL TEARS 0.1-0.3 % SOLN Place 1 drop into both eyes 2 (two) times daily.     Historical Provider, MD  calcium carbonate (OS-CAL) 600 MG TABS Take 600 mg by mouth 2 (two) times daily with a meal.    Historical Provider, MD  carvedilol (COREG) 25 MG tablet Take 25 mg by mouth at bedtime.     Historical Provider, MD  Cholecalciferol (VITAMIN D) 2000 units tablet Take 2,000 Units by mouth daily.    Historical Provider, MD  diltiazem (CARDIZEM CD) 120 MG 24 hr capsule Take 1 capsule (120 mg total) by mouth daily. 12/03/15   Lelon Perla, MD  ferrous sulfate 325 (65 FE) MG tablet Take 325 mg by mouth 2 (two) times daily with a meal.     Historical Provider, MD  furosemide (LASIX) 40 MG tablet Take 1 tablet (40 mg total) by mouth 2 (two) times daily through 8/5 then back to 40 am, 20 pm. 12/26/15   Tanna Furry, MD  LUTEIN PO Take 25 mg by mouth daily.     Historical Provider, MD  Melatonin 3 MG TABS Take 3 mg by mouth at bedtime.    Historical Provider, MD  Omega-3 Fatty Acids (FISH OIL PO) Take 1 capsule by mouth daily.    Historical Provider, MD  OXYGEN Inhale 3 L into the lungs See admin instructions. Use every night at bedtime and during the day if needed for shortness of breath    Historical Provider, MD  polyethylene glycol (MIRALAX / GLYCOLAX) packet Take 17 g by mouth 2 (two) times daily. Patient taking differently: Take 17 g by mouth daily as needed (constipation).  05/13/15   Geradine Girt, DO  Resveratrol 250 MG CAPS Take 250 mg by mouth daily.    Historical Provider, MD  saxagliptin HCl (ONGLYZA) 2.5 MG TABS tablet Take 2.5 mg by mouth daily.    Historical Provider, MD  temazepam (RESTORIL) 7.5 MG capsule Take 7.5 mg by mouth at bedtime as needed for sleep.  12/08/15   Historical Provider, MD    Review of Systems    As above, she was having dyspnea and probably orthopnea  though she is less clear about that. She has not been having any chest pain, PND, dizziness, syncopal, edema, or early satiety.  All other systems reviewed and are otherwise negative except as noted above.  Physical Exam    VS:  BP 130/62   Pulse 77   Ht 5\' 3"  (1.6 m)   Wt 133 lb (60.3 kg)   BMI 23.56 kg/m  , BMI Body mass index is 23.56 kg/m. GEN: Well nourished, well developed, in no acute  distress.  HEENT: normal.  Neck: Supple, no JVD, carotid bruits. Pulsatile mass noted on right. Cardiac: Irregularly irregular S1-S2 with a 2/6 systolic murmur at the apex. No rubs, or gallops. No clubbing, cyanosis, edema.  Radials/DP/PT 2+ and equal bilaterally.  Respiratory:  Respirations regular and unlabored, clear to auscultation bilaterally. GI: Soft, nontender, nondistended, BS + x 4. MS: no deformity or atrophy. Skin: warm and dry, no rash. Neuro:  Strength and sensation are intact. Psych: Normal affect.  Accessory Clinical Findings    ECG - Atrial fibrillation, 77, left bundle branch block, no acute ST or T changes.  Assessment & Plan    1.  Chronic systolic congestive heart failure/dilated cardiomyopathy: Patient was admitted in March with CHF and found to have LV dysfunction with an EF of 35-40%. She has since been conservatively managed with beta blocker and Lasix. She is not on ACE inhibitor/ARB/ARNI/spiro secondary to stage IV kidney disease. Notably, she is on diltiazem however, this is required for adequate rate control of her A. fib. Her Lasix dose has been somewhat of a moving target and has previously required adjustment secondary to stage IV kidney disease. She was initially on 40 mg twice a day, however this was reduced to 40 mg daily in March secondary to worsening renal function. Lasix was increased to 40 mg in the a.m. and 20 mg p.m. in early July in the setting of dyspnea. She was recently seen in the emergency department on July 30 secondary to a 3 day history of difficulty  sleeping, restlessness and probably orthopnea, though she is not clear on her symptoms. She also noted dyspnea on exertion. She had mild volume overload on exam and was treated with IV Lasix with significant improvement. Since her ER visit, she has been taking Lasix 40 mg twice a day and will continue this through tomorrow and then reduce her dose back to previous dose of 40 mg in the a.m. and 20 mg in the p.m. Her symptoms have significantly improved since her ER visit and her weight has been coming down and is currently 131 pounds. She is no longer having dyspnea, orthopnea, or edema. We did discuss her diet as well as symptom reporting an effort to avoid volume overload and the requirement for ER visits. It sounds as though she tries to avoid salt. She often has tomato sandwiches but is not adding any salt.  2. Persistent atrial fibrillation: This is well rate controlled on beta blocker and ACE inhibitor. She is on reduced dose eliquis and tolerating this.  3. Stage IV chronic kidney disease: Creatinine was stable at 1.96 upon recent ER visit. As we already have a plan to reduce her back to her previous Lasix dose, I will not repeat a basic metabolic panel today.  4. Severe mitral regurgitation: Conservatively managed.  5. Hypertension: Stable on current regimen.  6. Disposition: Follow-up with Dr. Stanford Breed in 2 months or sooner if necessary.   Murray Hodgkins, NP 12/31/2015, 12:15 PM

## 2016-02-08 ENCOUNTER — Ambulatory Visit (INDEPENDENT_AMBULATORY_CARE_PROVIDER_SITE_OTHER): Payer: Medicare Other | Admitting: Podiatry

## 2016-02-08 ENCOUNTER — Encounter: Payer: Self-pay | Admitting: Podiatry

## 2016-02-08 DIAGNOSIS — E1140 Type 2 diabetes mellitus with diabetic neuropathy, unspecified: Secondary | ICD-10-CM

## 2016-02-08 DIAGNOSIS — L84 Corns and callosities: Secondary | ICD-10-CM

## 2016-02-08 DIAGNOSIS — B351 Tinea unguium: Secondary | ICD-10-CM

## 2016-02-08 DIAGNOSIS — M79676 Pain in unspecified toe(s): Secondary | ICD-10-CM | POA: Diagnosis not present

## 2016-02-08 NOTE — Patient Instructions (Signed)
Where your diabetic shoes daily with custom insoles Diabetes and Foot Care Diabetes may cause you to have problems because of poor blood supply (circulation) to your feet and legs. This may cause the skin on your feet to become thinner, break easier, and heal more slowly. Your skin may become dry, and the skin may peel and crack. You may also have nerve damage in your legs and feet causing decreased feeling in them. You may not notice minor injuries to your feet that could lead to infections or more serious problems. Taking care of your feet is one of the most important things you can do for yourself.  HOME CARE INSTRUCTIONS  Wear shoes at all times, even in the house. Do not go barefoot. Bare feet are easily injured.  Check your feet daily for blisters, cuts, and redness. If you cannot see the bottom of your feet, use a mirror or ask someone for help.  Wash your feet with warm water (do not use hot water) and mild soap. Then pat your feet and the areas between your toes until they are completely dry. Do not soak your feet as this can dry your skin.  Apply a moisturizing lotion or petroleum jelly (that does not contain alcohol and is unscented) to the skin on your feet and to dry, brittle toenails. Do not apply lotion between your toes.  Trim your toenails straight across. Do not dig under them or around the cuticle. File the edges of your nails with an emery board or nail file.  Do not cut corns or calluses or try to remove them with medicine.  Wear clean socks or stockings every day. Make sure they are not too tight. Do not wear knee-high stockings since they may decrease blood flow to your legs.  Wear shoes that fit properly and have enough cushioning. To break in new shoes, wear them for just a few hours a day. This prevents you from injuring your feet. Always look in your shoes before you put them on to be sure there are no objects inside.  Do not cross your legs. This may decrease the  blood flow to your feet.  If you find a minor scrape, cut, or break in the skin on your feet, keep it and the skin around it clean and dry. These areas may be cleansed with mild soap and water. Do not cleanse the area with peroxide, alcohol, or iodine.  When you remove an adhesive bandage, be sure not to damage the skin around it.  If you have a wound, look at it several times a day to make sure it is healing.  Do not use heating pads or hot water bottles. They may burn your skin. If you have lost feeling in your feet or legs, you may not know it is happening until it is too late.  Make sure your health care provider performs a complete foot exam at least annually or more often if you have foot problems. Report any cuts, sores, or bruises to your health care provider immediately. SEEK MEDICAL CARE IF:   You have an injury that is not healing.  You have cuts or breaks in the skin.  You have an ingrown nail.  You notice redness on your legs or feet.  You feel burning or tingling in your legs or feet.  You have pain or cramps in your legs and feet.  Your legs or feet are numb.  Your feet always feel cold. [place]  CARE IF:   There is increasing redness, swelling, or pain in or around a wound.  There is a red line that goes up your leg.  Pus is coming from a wound.  You develop a fever or as directed by your health care provider.  You notice a bad smell coming from an ulcer or wound.   This information is not intended to replace advice given to you by your health care provider. Make sure you discuss any questions you have with your health care provider.   Document Released: 05/12/2000 Document Revised: 01/15/2013 Document Reviewed: 10/22/2012 Elsevier Interactive Patient Education [employer].

## 2016-02-08 NOTE — Progress Notes (Signed)
Patient ID: [name], female   DOB: 27-Dec-1925, 82 y.o.   MRN: [id number]   Subjective: This patient presents again with daughter present in the treatment room complaining of thickened and elongated toenails are cough or tingling shoes and requests toenail debridement. Also, patient complaining of uncomfortable callouses right foot.. Patient recovering from fall since the visit of 02/23/2015 a history of shoulder fracture and rib fracture  Objective: Orientated 3 No open skin lesions bilaterally The toenails are elongated, brittle, discolored, deformed, tender to palpation 6-10 Keratoses With bleeding plantar right first MPJ Bleeding callus distal third right toe  Assessment: Diabetic with a history neuropathy and angiopathy Symptomatic onychomycoses 6-10 Pre-ulcerative calluses 2  Plan: Debridement toenails 6-10 mechanically and electronically without a bleeding Debrided keratoses 2 without any bleeding Continue wearing diabetic shoes  Reappoint 3 months

## 2016-02-23 NOTE — Progress Notes (Signed)
HPI: Follow-up atrial fibrillation and congestive heart failure. Had fall 12/16. She had rib fractures and fracture of her right scapula. She was treated conservatively. She was noted to be in atrial fibrillation at that time. Echocardiogram revealed Ejection fraction 55-60%, mild aortic insufficiency and mild mitral regurgitation. She was treated with rate control and discharged. Reeadmitted 3/17 with CHF. Echocardiogram repeated and showed ejection fraction 35-40%, biatrial enlargement, moderate aortic insufficiency, severe mitral regurgitation and mild to moderate tricuspid regurgitation. BNP 963. Troponins negative. Patient requested only conservative measures given her age which we thought was appropriate. She has had problems with recurrent CHF. She also has renal insufficiency. Since last seen, she has some dyspnea on exertion which was worse over the weekend and her Lasix was increased to 40 mg twice a day. She is now improved. She denies chest pain, palpitations or syncope.  Current Outpatient Prescriptions  Medication Sig Dispense Refill  . acetaminophen (TYLENOL) 500 MG tablet Take 500 mg by mouth every 6 (six) hours as needed.    [name] apixaban (ELIQUIS) 2.5 MG TABS tablet Take 1 tablet (2.5 mg total) by mouth 2 (two) times daily. 60 tablet 5  . ARTIFICIAL TEARS 0.1-0.3 % SOLN Place 1 drop into both eyes 2 (two) times daily.     . calcium carbonate (OS-CAL) 600 MG TABS Take 600 mg by mouth 2 (two) times daily with a meal.    . carvedilol (COREG) 25 MG tablet Take 25 mg by mouth at bedtime.     . Cholecalciferol (VITAMIN D) 2000 units tablet Take 2,000 Units by mouth daily.    [name] diltiazem (CARDIZEM CD) 120 MG 24 hr capsule Take 1 capsule (120 mg total) by mouth daily. 30 capsule 11  . ferrous sulfate 325 (65 FE) MG tablet Take 325 mg by mouth 2 (two) times daily with a meal.     . furosemide (LASIX) 40 MG tablet Take 1 tablet (40 mg total) by mouth 2 (two) times daily. 14 tablet 0  .  hydrALAZINE (APRESOLINE) 10 MG tablet Take 1 tablet by mouth daily.    . LUTEIN PO Take 25 mg by mouth daily.     . Melatonin 3 MG TABS Take 3 mg by mouth at bedtime.    . Omega-3 Fatty Acids (FISH OIL PO) Take 1 capsule by mouth daily.    . OXYGEN Inhale 3 L into the lungs See admin instructions. Use every night at bedtime and during the day if needed for shortness of breath    . Resveratrol 250 MG CAPS Take 250 mg by mouth daily.     No current facility-administered medications for this visit.      Past Medical History:  Diagnosis Date  . Cancer [facility])    breast cancer  . Cardiomyopathy ([place])    a. 04/2015 Echo: EF 55-60%;  b. 07/2015 Echo: EF 35-40%, mod LVH, antsep DK, mod AI, sev MR, sev dil LA, mildly dil RA, mild-mod TR, PASP 37mmHg-->conservatively managed.  . Chronic systolic CHF (congestive heart failure) ([place])    a. 07/2015 Echo: EF 35-40%.  . CKD (chronic kidney disease), stage IV ([place])   . Diabetes mellitus without complication ([place])   . Essential hypertension   . HLD (hyperlipidemia)   . Permanent atrial fibrillation ([place])    a. diagnosed 04/2015 --> conservative mgmt with rate control and xarelto.  . Pulsatile neck mass    a. 11/2015 Neck U/S: pulsatile R neck mass correlates w/ a toruous  and mildy ectatic segment of the R SCA measuring ~ 2.1 cm in max diameter - no change since noted on 12/16 CT.  [name] Severe mitral regurgitation by prior echocardiogram    a. 07/2015 sev MR    Past Surgical History:  Procedure Laterality Date  . ABDOMINAL HYSTERECTOMY    . BREAST LUMPECTOMY    . FRACTURE SURGERY     L femur  . JOINT REPLACEMENT     knees    Social History   Social History  . Marital status: Widowed    Spouse name: N/A  . Number of children: N/A  . Years of education: N/A   Occupational History  . Not on file.   Social History Main Topics  . Smoking status: Never Smoker  . Smokeless tobacco: Never Used  . Alcohol use No  . Drug use: No  . Sexual  activity: No   Other Topics Concern  . Not on file   Social History Narrative  . No narrative on file    Family History  Problem Relation Age of Onset  . Coronary artery disease Father   . Stroke Mother     ROS: no fevers or chills, productive cough, hemoptysis, dysphasia, odynophagia, melena, hematochezia, dysuria, hematuria, rash, seizure activity, orthopnea, PND, pedal edema, claudication. Remaining systems are negative.  Physical Exam: Well-developed frail in no acute distress.  Skin is warm and dry.  HEENT is normal.  Neck is supple.  Chest is clear to auscultation with normal expansion.  Cardiovascular exam is irregular Abdominal exam nontender or distended. No masses palpated. Extremities show no edema. neuro grossly intact   A/P  1 chronic combined systolic and diastolic congestive heart failure-continue present dose of diuretics. Check potassium and renal function. She is improved on higher dose Lasix. We discussed low sodium diet and fluid restriction.  2 permanent atrial fibrillation-continue carvedilol and Cardizem for rate control. Continue apixaban.  3 mitral regurgitation-patient wants only conservative measures and would like to avoid all procedures. She is a no CODE BLUE.  4 cardiomyopathy-etiology is unclear. Question secondary to tachycardia or valvular heart disease. Continue beta blocker. No ACE inhibitor given renal insufficiency. Increase hydralazine to 10 mg by mouth 3 times a day.  5 hyperlipidemia-management per primary care.  6 chronic stage IV kidney disease-check potassium and renal function.  Kirk Ruths, MD

## 2016-02-27 ENCOUNTER — Telehealth: Payer: Self-pay | Admitting: Physician Assistant

## 2016-02-27 NOTE — Telephone Encounter (Signed)
    Patient called on call provider about some congestion and tightness in her chest similar to when she had increased fluid in the past. Current does of lasix is 40mg  in AM and 20mg  in PM. I advised her to take 40mg  this PM instead of 20mg  to see if that helps. Fortunately, she has follow up with Dr. Stanford Breed this Tuesday AM. I advised her to keep this appointment. She will call us if anything worsens inbetween now and then.    Angelena Form PA-C  [facility]

## 2016-02-29 ENCOUNTER — Encounter: Payer: Self-pay | Admitting: Cardiology

## 2016-02-29 ENCOUNTER — Ambulatory Visit (INDEPENDENT_AMBULATORY_CARE_PROVIDER_SITE_OTHER): Payer: Medicare Other | Admitting: Cardiology

## 2016-02-29 VITALS — BP 134/59 | HR 68 | Ht 62.0 in | Wt 131.4 lb

## 2016-02-29 DIAGNOSIS — I4891 Unspecified atrial fibrillation: Secondary | ICD-10-CM

## 2016-02-29 DIAGNOSIS — I4819 Other persistent atrial fibrillation: Secondary | ICD-10-CM

## 2016-02-29 DIAGNOSIS — I10 Essential (primary) hypertension: Secondary | ICD-10-CM | POA: Diagnosis not present

## 2016-02-29 DIAGNOSIS — I420 Dilated cardiomyopathy: Secondary | ICD-10-CM | POA: Diagnosis not present

## 2016-02-29 DIAGNOSIS — I481 Persistent atrial fibrillation: Secondary | ICD-10-CM

## 2016-02-29 DIAGNOSIS — I5042 Chronic combined systolic (congestive) and diastolic (congestive) heart failure: Secondary | ICD-10-CM

## 2016-02-29 MED ORDER — HYDRALAZINE HCL 10 MG PO TABS: 10.0000 mg | ORAL_TABLET | Freq: Three times a day (TID) | ORAL | 6 refills | Status: DC

## 2016-02-29 MED ORDER — HYDRALAZINE HCL 10 MG PO TABS: 10.0000 mg | ORAL_TABLET | Freq: Three times a day (TID) | ORAL | 3 refills | Status: DC

## 2016-02-29 NOTE — Patient Instructions (Signed)
Medication Instructions:   INCREASE HYDRALAZINE TO 10 MG ONE TABLET THREE TIMES DAILY  Labwork:  Your physician recommends that you HAVE LAB WORK TODAY  Follow-Up:  Your physician recommends that you schedule a follow-up appointment in: [place]

## 2016-03-01 ENCOUNTER — Telehealth: Payer: Self-pay | Admitting: *Deleted

## 2016-03-01 DIAGNOSIS — N289 Disorder of kidney and ureter, unspecified: Secondary | ICD-10-CM

## 2016-03-01 LAB — BASIC METABOLIC PANEL
BUN: 50 mg/dL — AB (ref 7–25)
CHLORIDE: 101 mmol/L (ref 98–110)
CO2: 26 mmol/L (ref 20–31)
Calcium: 9.7 mg/dL (ref 8.6–10.4)
Creat: 2.49 mg/dL — ABNORMAL HIGH (ref 0.60–0.88)
Glucose, Bld: 112 mg/dL — ABNORMAL HIGH (ref 65–99)
POTASSIUM: 4.1 mmol/L (ref 3.5–5.3)
SODIUM: 138 mmol/L (ref 135–146)

## 2016-03-01 NOTE — Telephone Encounter (Signed)
-----   Message from Lelon Perla, MD sent at 03/01/2016  5:07 AM EDT ----- Repeat bmet 2 weeks Kirk Ruths

## 2016-03-01 NOTE — Telephone Encounter (Signed)
Spoke with pt, aware of labs and need for repeat. Lab orders mailed to the pt

## 2016-03-06 ENCOUNTER — Telehealth: Payer: Self-pay | Admitting: Cardiology

## 2016-03-06 NOTE — Telephone Encounter (Signed)
New message     Patient daughter calling regarding oxygen reorder for her mother.

## 2016-03-06 NOTE — Telephone Encounter (Signed)
Spoke with pt dtr, aware no paperwork has been received but I faxed an order for oxygen to [facility].

## 2016-03-06 NOTE — Telephone Encounter (Signed)
returned call. daughter wanted to make sure patient could continue o2. supplier is [facility], and they are needing order for continuation of O2 therapy. states Dr. Stanford Breed signed original order & she thinks [facility] sent request for continuation orders. informed daughter I am unsure if something has been sent to our office for signature, will route to Hilda Blades. (if nothing received I can contact [place])

## 2016-03-15 LAB — BASIC METABOLIC PANEL
BUN: 58 mg/dL — ABNORMAL HIGH (ref 7–25)
CALCIUM: 9.9 mg/dL (ref 8.6–10.4)
CO2: 26 mmol/L (ref 20–31)
CREATININE: 2.28 mg/dL — AB (ref 0.60–0.88)
Chloride: 99 mmol/L (ref 98–110)
GLUCOSE: 109 mg/dL — AB (ref 65–99)
Potassium: 4.4 mmol/L (ref 3.5–5.3)
Sodium: 139 mmol/L (ref 135–146)

## 2016-04-24 ENCOUNTER — Encounter: Payer: Self-pay | Admitting: Cardiology

## 2016-04-26 NOTE — Progress Notes (Signed)
HPI: Follow-up atrial fibrillation and congestive heart failure. Had fall 12/16. She had rib fractures and fracture of her right scapula. She was treated conservatively. She was noted to be in atrial fibrillation at that time. Echocardiogram revealed Ejection fraction 55-60%, mild aortic insufficiency and mild mitral regurgitation. She was treated with rate control and discharged. Reeadmitted 3/17 with CHF. Echocardiogram repeated and showed ejection fraction 35-40%, biatrial enlargement, moderate aortic insufficiency, severe mitral regurgitation and mild to moderate tricuspid regurgitation. BNP 963. Troponins negative. Patient requested only conservative measures given her age which we thought was appropriate. She has had problems with recurrent CHF. She also has renal insufficiency. Since last seen, she denies dyspnea, chest pain, palpitations or syncope. No pedal edema.  Current Outpatient Prescriptions  Medication Sig Dispense Refill  . acetaminophen (TYLENOL) 500 MG tablet Take 500 mg by mouth every 6 (six) hours as needed.    [name] apixaban (ELIQUIS) 2.5 MG TABS tablet Take 1 tablet (2.5 mg total) by mouth 2 (two) times daily. 60 tablet 5  . ARTIFICIAL TEARS 0.1-0.3 % SOLN Place 1 drop into both eyes 2 (two) times daily.     . calcium carbonate (OS-CAL) 600 MG TABS Take 600 mg by mouth 2 (two) times daily with a meal.    . carvedilol (COREG) 25 MG tablet Take 25 mg by mouth at bedtime.     . Cholecalciferol (VITAMIN D) 2000 units tablet Take 2,000 Units by mouth daily.    [name] diltiazem (CARDIZEM CD) 120 MG 24 hr capsule Take 1 capsule (120 mg total) by mouth daily. 30 capsule 11  . diphenhydramine-acetaminophen (TYLENOL PM) 25-500 MG TABS tablet Take 1 tablet by mouth at bedtime as needed.    . ferrous sulfate 325 (65 FE) MG tablet Take 325 mg by mouth 2 (two) times daily with a meal.     . furosemide (LASIX) 40 MG tablet Take 1 tablet (40 mg total) by mouth 2 (two) times daily. 14 tablet 0    . hydrALAZINE (APRESOLINE) 10 MG tablet Take 1 tablet (10 mg total) by mouth 3 (three) times daily. 270 tablet 3  . LUTEIN PO Take 25 mg by mouth daily.     . Omega-3 Fatty Acids (FISH OIL PO) Take 1 capsule by mouth daily.    . OXYGEN Inhale 3 L into the lungs See admin instructions. Use every night at bedtime and during the day if needed for shortness of breath    . Resveratrol 250 MG CAPS Take 250 mg by mouth daily.     No current facility-administered medications for this visit.      Past Medical History:  Diagnosis Date  . Cancer [facility])    breast cancer  . Cardiomyopathy ([place])    a. 04/2015 Echo: EF 55-60%;  b. 07/2015 Echo: EF 35-40%, mod LVH, antsep DK, mod AI, sev MR, sev dil LA, mildly dil RA, mild-mod TR, PASP 61mmHg-->conservatively managed.  . Chronic systolic CHF (congestive heart failure) ([place])    a. 07/2015 Echo: EF 35-40%.  . CKD (chronic kidney disease), stage IV ([place])   . Diabetes mellitus without complication ([place])   . Essential hypertension   . HLD (hyperlipidemia)   . Permanent atrial fibrillation ([place])    a. diagnosed 04/2015 --> conservative mgmt with rate control and xarelto.  . Pulsatile neck mass    a. 11/2015 Neck U/S: pulsatile R neck mass correlates w/ a toruous and mildy ectatic segment of the R SCA measuring ~  2.1 cm in max diameter - no change since noted on 12/16 CT.  [name] Severe mitral regurgitation by prior echocardiogram    a. 07/2015 sev MR    Past Surgical History:  Procedure Laterality Date  . ABDOMINAL HYSTERECTOMY    . BREAST LUMPECTOMY    . FRACTURE SURGERY     L femur  . JOINT REPLACEMENT     knees    Social History   Social History  . Marital status: Widowed    Spouse name: N/A  . Number of children: N/A  . Years of education: N/A   Occupational History  . Not on file.   Social History Main Topics  . Smoking status: Never Smoker  . Smokeless tobacco: Never Used  . Alcohol use No  . Drug use: No  . Sexual activity: No    Other Topics Concern  . Not on file   Social History Narrative  . No narrative on file    Family History  Problem Relation Age of Onset  . Coronary artery disease Father   . Stroke Mother     ROS: no fevers or chills, productive cough, hemoptysis, dysphasia, odynophagia, melena, hematochezia, dysuria, hematuria, rash, seizure activity, orthopnea, PND, pedal edema, claudication. Remaining systems are negative.  Physical Exam: Well-developed well-nourished in no acute distress.  Skin is warm and dry.  HEENT is normal.  Neck is supple.  Chest is clear to auscultation with normal expansion.  Cardiovascular exam is irregular Abdominal exam nontender or distended. No masses palpated. Extremities show no edema. neuro grossly intact    A/P  1 chronic combined systolic/diastolic congestive heart failure-patient's volume status appears to be reasonable. Continue present dose of Lasix. Check potassium and renal function. I discussed the importance of low sodium diet. She will weigh herself and if she reaches 128 pounds she will take an additional 20 mg of Lasix.   2 permanent atrial fibrillation-continue carvedilol and Cardizem for rate control. Continue apixaban.   3 mitral regurgitation-patient wants only conservative measures and would like to avoid all procedures. She is a no CODE BLUE.  4 chronic stage IV kidney disease-check potassium and renal function.  5 hyperlipidemia-management per primary care.  6 cardiomyopathy-unclear etiology. Continue beta blocker. No ACE inhibitor given renal insufficiency. Continue hydralazine.  Kirk Ruths, MD

## 2016-05-01 ENCOUNTER — Encounter: Payer: Self-pay | Admitting: Cardiology

## 2016-05-01 ENCOUNTER — Ambulatory Visit (INDEPENDENT_AMBULATORY_CARE_PROVIDER_SITE_OTHER): Payer: Medicare Other | Admitting: Cardiology

## 2016-05-01 VITALS — BP 128/52 | HR 75 | Ht 62.0 in | Wt 128.0 lb

## 2016-05-01 DIAGNOSIS — N289 Disorder of kidney and ureter, unspecified: Secondary | ICD-10-CM | POA: Diagnosis not present

## 2016-05-01 DIAGNOSIS — I5042 Chronic combined systolic (congestive) and diastolic (congestive) heart failure: Secondary | ICD-10-CM | POA: Diagnosis not present

## 2016-05-01 DIAGNOSIS — I4891 Unspecified atrial fibrillation: Secondary | ICD-10-CM | POA: Diagnosis not present

## 2016-05-01 LAB — BASIC METABOLIC PANEL
BUN: 72 mg/dL — AB (ref 7–25)
CO2: 28 mmol/L (ref 20–31)
Calcium: 9.4 mg/dL (ref 8.6–10.4)
Chloride: 101 mmol/L (ref 98–110)
Creat: 2.42 mg/dL — ABNORMAL HIGH (ref 0.60–0.88)
Glucose, Bld: 98 mg/dL (ref 65–99)
POTASSIUM: 4 mmol/L (ref 3.5–5.3)
Sodium: 139 mmol/L (ref 135–146)

## 2016-05-01 NOTE — Patient Instructions (Signed)
Medication Instructions:   NO CHANGE  Labwork:  Your physician recommends that you HAVE LAB WORK TODAY  Follow-Up:  Your physician recommends that you schedule a follow-up appointment in: 3 MONTHS WITH DR CRENSHAW      

## 2016-05-02 ENCOUNTER — Telehealth: Payer: Self-pay | Admitting: *Deleted

## 2016-05-02 DIAGNOSIS — Z79899 Other long term (current) drug therapy: Secondary | ICD-10-CM

## 2016-05-02 NOTE — Telephone Encounter (Signed)
Advised patient of lab results, medication changes, and lab orders placed   Notes Recorded by Lelon Perla, MD on 05/02/2016 at 7:26 AM EST Change lasix to 40 q AM and 20 q pm; take additional 20 mg daily for dyspnea or weight gain of 2-3 lbs Kirk Ruths

## 2016-05-02 NOTE — Addendum Note (Signed)
Addended by: Alvina Filbert B on: 05/02/2016 05:40 PM   Modules accepted: Orders

## 2016-05-02 NOTE — Telephone Encounter (Signed)
-----   Message from Lelon Perla, MD sent at 05/02/2016  7:27 AM EST ----- BMET 2 weeks Kirk Ruths

## 2016-05-09 ENCOUNTER — Ambulatory Visit (INDEPENDENT_AMBULATORY_CARE_PROVIDER_SITE_OTHER): Payer: Medicare Other | Admitting: Podiatry

## 2016-05-09 ENCOUNTER — Encounter: Payer: Self-pay | Admitting: Podiatry

## 2016-05-09 VITALS — BP 143/68 | HR 88 | Resp 14

## 2016-05-09 DIAGNOSIS — B351 Tinea unguium: Secondary | ICD-10-CM

## 2016-05-09 DIAGNOSIS — Q828 Other specified congenital malformations of skin: Secondary | ICD-10-CM | POA: Diagnosis not present

## 2016-05-09 DIAGNOSIS — E1140 Type 2 diabetes mellitus with diabetic neuropathy, unspecified: Secondary | ICD-10-CM

## 2016-05-09 DIAGNOSIS — L84 Corns and callosities: Secondary | ICD-10-CM

## 2016-05-09 DIAGNOSIS — M79676 Pain in unspecified toe(s): Secondary | ICD-10-CM | POA: Diagnosis not present

## 2016-05-09 NOTE — Patient Instructions (Signed)

## 2016-05-09 NOTE — Progress Notes (Signed)
Patient ID: [name], female   DOB: [birth year], 82 y.o.   MRN: [id number]    Subjective: This patient presents again with daughter present in the treatment room complaining of thickened and elongated toenails are cough or tingling shoes and requests toenail debridement. Also, patient complaining of uncomfortable callouses right foot.. Patient recovering from fall since the visit of 02/23/2015 a history of shoulder fracture and rib fracture  Objective: Orientated 3 Varicose veins ankles bilaterally DP and PT pulses 2/4 bilaterally Capillary reflex immediate bilaterally Sensation to 10 g monofilament wire intact 3/5 right 4/5 left Vibratory sensation reactive bilaterally Ankle reflexes reactive bilaterally Atrophic skin bilaterally No open skin lesions bilaterally The toenails are elongated, brittle, discolored, deformed, tender to palpation 6-10 Keratoses With bleeding plantar right first MPJ Bleeding callus distal third right toe  Assessment: Diabetic with a history neuropathy  Symptomatic onychomycoses 6-10 Pre-ulcerative calluses 2  Plan: Debridement toenails 6-10 mechanically and electronically without a bleeding Debrided keratoses 2 without any bleeding Continue wearing diabetic shoes  Reappoint 3 months

## 2016-06-19 ENCOUNTER — Telehealth: Payer: Self-pay | Admitting: *Deleted

## 2016-06-19 MED ORDER — APIXABAN 2.5 MG PO TABS: 2.5000 mg | ORAL_TABLET | Freq: Two times a day (BID) | ORAL | 5 refills | Status: DC

## 2016-06-19 MED ORDER — APIXABAN 2.5 MG PO TABS: 2.5000 mg | ORAL_TABLET | Freq: Two times a day (BID) | ORAL | 1 refills | Status: DC

## 2016-06-19 NOTE — Telephone Encounter (Signed)
Pt calling,says she is completely out of her medicine,please call it in today.

## 2016-06-19 NOTE — Telephone Encounter (Signed)
Received msg on the refill vm requesting an rx for eliquis be sent to [facility] [address] as patient is out and also an rx be [facility]. Thanks, MI

## 2016-06-19 NOTE — Telephone Encounter (Signed)
Prescription sent to both local [facility] and mail order pharmacies

## 2016-07-27 NOTE — Progress Notes (Signed)
HPI: Follow-up atrial fibrillation and congestive heart failure. Had fall 12/16. She had rib fractures and fracture of her right scapula. She was treated conservatively. She was noted to be in atrial fibrillation at that time. Echocardiogram revealed Ejection fraction 55-60%, mild aortic insufficiency and mild mitral regurgitation. She was treated with rate control and discharged. Reeadmitted 3/17 with CHF. Echocardiogram repeated and showed ejection fraction 35-40%, biatrial enlargement, moderate aortic insufficiency, severe mitral regurgitation and mild to moderate tricuspid regurgitation. BNP 963. Troponins negative. Patient requested only conservative measures given her age which we thought was appropriate. She has had problems with recurrent CHF. She also has renal insufficiency. Since last seen, she denies dyspnea, chest pain, palpitations, syncope or bleeding.  Current Outpatient Prescriptions  Medication Sig Dispense Refill  . acetaminophen (TYLENOL) 500 MG tablet Take 500 mg by mouth every 6 (six) hours as needed.    [name] apixaban (ELIQUIS) 2.5 MG TABS tablet Take 1 tablet (2.5 mg total) by mouth 2 (two) times daily. 180 tablet 1  . ARTIFICIAL TEARS 0.1-0.3 % SOLN Place 1 drop into both eyes 2 (two) times daily.     . calcium carbonate (OS-CAL) 600 MG TABS Take 600 mg by mouth 2 (two) times daily with a meal.    . carvedilol (COREG) 25 MG tablet Take 25 mg by mouth at bedtime.     . Cholecalciferol (VITAMIN D) 2000 units tablet Take 2,000 Units by mouth daily.    [name] diltiazem (CARDIZEM CD) 120 MG 24 hr capsule Take 1 capsule (120 mg total) by mouth daily. 30 capsule 11  . diphenhydramine-acetaminophen (TYLENOL PM) 25-500 MG TABS tablet Take 1 tablet by mouth at bedtime as needed.    . ferrous sulfate 325 (65 FE) MG tablet Take 325 mg by mouth 2 (two) times daily with a meal.     . furosemide (LASIX) 40 MG tablet Take 40 mg by mouth as directed. 1 tablet by mouth in the morning and 1/2 in  the evening    . hydrALAZINE (APRESOLINE) 10 MG tablet Take 1 tablet (10 mg total) by mouth 3 (three) times daily. 270 tablet 3  . LUTEIN PO Take 25 mg by mouth daily.     . Omega-3 Fatty Acids (FISH OIL PO) Take 1 capsule by mouth daily.    . OXYGEN Inhale 3 L into the lungs See admin instructions. Use every night at bedtime and during the day if needed for shortness of breath    . Resveratrol 250 MG CAPS Take 250 mg by mouth daily.    . saxagliptin HCl (ONGLYZA) 2.5 MG TABS tablet Take 2.5 mg by mouth as directed.     No current facility-administered medications for this visit.      Past Medical History:  Diagnosis Date  . Cancer [facility])    breast cancer  . Cardiomyopathy ([place])    a. 04/2015 Echo: EF 55-60%;  b. 07/2015 Echo: EF 35-40%, mod LVH, antsep DK, mod AI, sev MR, sev dil LA, mildly dil RA, mild-mod TR, PASP 49mmHg-->conservatively managed.  . Chronic systolic CHF (congestive heart failure) ([place])    a. 07/2015 Echo: EF 35-40%.  . CKD (chronic kidney disease), stage IV ([place])   . Diabetes mellitus without complication ([place])   . Essential hypertension   . HLD (hyperlipidemia)   . Permanent atrial fibrillation ([place])    a. diagnosed 04/2015 --> conservative mgmt with rate control and xarelto.  . Pulsatile neck mass    a.  11/2015 Neck U/S: pulsatile R neck mass correlates w/ a toruous and mildy ectatic segment of the R SCA measuring ~ 2.1 cm in max diameter - no change since noted on 12/16 CT.  [name] Severe mitral regurgitation by prior echocardiogram    a. 07/2015 sev MR    Past Surgical History:  Procedure Laterality Date  . ABDOMINAL HYSTERECTOMY    . BREAST LUMPECTOMY    . FRACTURE SURGERY     L femur  . JOINT REPLACEMENT     knees    Social History   Social History  . Marital status: Widowed    Spouse name: N/A  . Number of children: N/A  . Years of education: N/A   Occupational History  . Not on file.   Social History Main Topics  . Smoking status: Never Smoker    . Smokeless tobacco: Never Used  . Alcohol use No  . Drug use: No  . Sexual activity: No   Other Topics Concern  . Not on file   Social History Narrative  . No narrative on file    Family History  Problem Relation Age of Onset  . Coronary artery disease Father   . Stroke Mother     ROS: no fevers or chills, productive cough, hemoptysis, dysphasia, odynophagia, melena, hematochezia, dysuria, hematuria, rash, seizure activity, orthopnea, PND, pedal edema, claudication. Remaining systems are negative.  Physical Exam: Well-developed well-nourished in no acute distress.  Skin is warm and dry.  HEENT is normal.  Neck is supple.  Chest is clear to auscultation with normal expansion.  Cardiovascular exam is irregular Abdominal exam nontender or distended. No masses palpated. Extremities show no edema. neuro grossly intact  ECG- atrial fibrillation at a rate of 80, left bundle branch block; personally reviewed  A/P  1 chronic combined systolic/diastolic congestive heart failure-patient appears to be euvolemic. Continue present dose of Lasix. Check potassium and renal function. I discussed the importance of low sodium diet and fluid restriction. She will weigh herself daily and take an additional 20 mg of Lasix for weight of 129-130 lbs.  2 permanent atrial fibrillation-continue carvedilol and Cardizem for rate control. Continue apixaban.   3 mitral regurgitation-patient wants only conservative measures and would like to avoid all procedures. She is a no CODE BLUE.  4 chronic stage IV kidney disease-check potassium and renal function.  5 hyperlipidemia-management per primary care.  6 cardiomyopathy-unclear etiology. Continue beta blocker. No ACE inhibitor given renal insufficiency. Continue hydralazine. Pt does not want aggressive cardiac WU.  Kirk Ruths, MD

## 2016-07-31 ENCOUNTER — Encounter: Payer: Self-pay | Admitting: Cardiology

## 2016-07-31 ENCOUNTER — Ambulatory Visit (INDEPENDENT_AMBULATORY_CARE_PROVIDER_SITE_OTHER): Payer: Medicare Other | Admitting: Cardiology

## 2016-07-31 VITALS — BP 148/58 | HR 80 | Ht 61.0 in | Wt 128.2 lb

## 2016-07-31 DIAGNOSIS — I4891 Unspecified atrial fibrillation: Secondary | ICD-10-CM

## 2016-07-31 DIAGNOSIS — I5042 Chronic combined systolic (congestive) and diastolic (congestive) heart failure: Secondary | ICD-10-CM

## 2016-07-31 LAB — CBC
HCT: 35.3 % (ref 35.0–45.0)
Hemoglobin: 11.9 g/dL (ref 11.7–15.5)
MCH: 30.5 pg (ref 27.0–33.0)
MCHC: 33.7 g/dL (ref 32.0–36.0)
MCV: 90.5 fL (ref 80.0–100.0)
MPV: 9.4 fL (ref 7.5–12.5)
Platelets: 223 10*3/uL (ref 140–400)
RBC: 3.9 MIL/uL (ref 3.80–5.10)
RDW: 14.3 % (ref 11.0–15.0)
WBC: 8.2 10*3/uL (ref 3.8–10.8)

## 2016-07-31 LAB — BASIC METABOLIC PANEL
BUN: 57 mg/dL — ABNORMAL HIGH (ref 7–25)
CO2: 26 mmol/L (ref 20–31)
Calcium: 10 mg/dL (ref 8.6–10.4)
Chloride: 102 mmol/L (ref 98–110)
Creat: 2.16 mg/dL — ABNORMAL HIGH (ref 0.60–0.88)
GLUCOSE: 95 mg/dL (ref 65–99)
POTASSIUM: 4.7 mmol/L (ref 3.5–5.3)
SODIUM: 139 mmol/L (ref 135–146)

## 2016-07-31 NOTE — Patient Instructions (Signed)
Medication Instructions:   NO CHANGE  Labwork:  Your physician recommends that you HAVE LAB WORK TODAY   Follow-Up:  Your physician wants you to follow-up in: 4 MONTHS WITH DR CRENSHAW You will receive a reminder letter in the mail two months in advance. If you don't receive a letter, please call our office to schedule the follow-up appointment.   If you need a refill on your cardiac medications before your next appointment, please call your pharmacy.    

## 2016-08-08 ENCOUNTER — Ambulatory Visit: Payer: Medicare Other | Admitting: Podiatry

## 2016-09-08 ENCOUNTER — Other Ambulatory Visit: Payer: Self-pay | Admitting: *Deleted

## 2016-09-08 MED ORDER — FUROSEMIDE 40 MG PO TABS: ORAL_TABLET | ORAL | 3 refills | Status: DC

## 2016-09-08 MED ORDER — DILTIAZEM HCL ER COATED BEADS 120 MG PO CP24: 120.0000 mg | ORAL_CAPSULE | Freq: Every day | ORAL | 3 refills | Status: DC

## 2016-09-13 ENCOUNTER — Ambulatory Visit (INDEPENDENT_AMBULATORY_CARE_PROVIDER_SITE_OTHER): Payer: Medicare Other | Admitting: Podiatry

## 2016-09-13 DIAGNOSIS — B351 Tinea unguium: Secondary | ICD-10-CM | POA: Diagnosis not present

## 2016-09-13 DIAGNOSIS — L84 Corns and callosities: Secondary | ICD-10-CM

## 2016-09-13 DIAGNOSIS — M79676 Pain in unspecified toe(s): Secondary | ICD-10-CM | POA: Diagnosis not present

## 2016-09-13 DIAGNOSIS — E1140 Type 2 diabetes mellitus with diabetic neuropathy, unspecified: Secondary | ICD-10-CM | POA: Diagnosis not present

## 2016-09-13 NOTE — Progress Notes (Signed)
Patient ID: [name], female   DOB: August 09, 1925, 83 y.o.   MRN: [id number]   Subjective: This patient presents again with daughter present in the treatment room complaining of thickened and elongated toenails are cough or tingling shoes and requests toenail debridement. Also, patient complaining of uncomfortable callouses right foot.. Patient recovering from fall since the visit of 02/23/2015 a history of shoulder fracture and rib fracture  Objective: Orientated 3 Varicose veins ankles bilaterally DP and PT pulses 2/4 bilaterally Capillary reflex immediate bilaterally Sensation to 10 g monofilament wire intact 3/5 right 4/5 left Vibratory sensation reactive bilaterally Ankle reflexes reactive bilaterally Atrophic skin bilaterally in hair growth bilaterally No open skin lesions bilaterally The toenails are elongated, brittle, discolored, deformed, tender to palpation 6-10 Keratoses With bleeding plantar right first MPJ Bleeding callus distal third right toe HAV bilaterally  Assessment: Diabetic with a history neuropathy  Symptomatic onychomycoses 6-10 Pre-ulcerative calluses 2  Plan: Debridement toenails 6-10 mechanically and electronically without a bleeding Debrided keratoses 2 without any bleeding Continue wearing diabetic shoes  Reappoint 3 months

## 2016-09-13 NOTE — Patient Instructions (Signed)

## 2016-09-19 ENCOUNTER — Telehealth: Payer: Self-pay | Admitting: Cardiology

## 2016-09-19 NOTE — Telephone Encounter (Signed)
Returned call.  Since initial call today daughter reports pt symptoms of N/V/D improved. Daughter notified PCP about the nausea/vomiting, PCP called in Zofran to use if needed.  Notes last night pt weight was up to 128 lbs (usually 125-126 lbs). She took extra 20mg  of lasix last night (usual dose is 40mg  am and 20mg  in PM) for symptom of weight gain. No SOB/swelling identified.  Informed her I would let Dr. Stanford Breed know of concerns. Rec'd to f/u w PCP if N/V unabated, and to call us if further concerns w her fluid/weights -- No further recommendations at this time.   She voiced understanding & thanks.

## 2016-09-19 NOTE — Telephone Encounter (Signed)
Follow up    Pt daughter is returning call

## 2016-09-19 NOTE — Telephone Encounter (Signed)
Daughter wanted you to know pt had to take an extra Lasix pill last night,because her weight was up. She also had diarrhea and vomiting last night.

## 2016-09-19 NOTE — Telephone Encounter (Signed)
Left message for pt dtr to call  

## 2016-09-25 ENCOUNTER — Telehealth: Payer: Self-pay | Admitting: Cardiology

## 2016-09-25 NOTE — Telephone Encounter (Signed)
Received page from daughter, Selinda Eon.  Ms. Manzer' heart rates have been in the 90s-110s this evening; she has noted intermittent palpitations with this.  She denies any CP or SOB.  Her blood pressures have been running in the 160s-170s.  The patient just took her evening carvedilol shortly before.  I advised pt's daughter that once carvedilol took effect and once she slept, her heart rates would likely get better.  I advised her that if her mother were to develop CP, SOB, or hypotension, she should bring her to the emergency department for evaluation.  She was in agreement with the plan.  She will call the office in the AM if heart rates are still running high.

## 2016-09-26 ENCOUNTER — Telehealth: Payer: Self-pay | Admitting: Cardiology

## 2016-09-26 NOTE — Telephone Encounter (Signed)
Spoke with daughter and patient is feeling fine this am, up doing her normal routine. Blood pressure 160/66 HR 65, heart rates usually run in 70's. Daughter stated she was to call this am with update, spoke with on call physician see below   Doylene Canning, MD  Note    Received page from daughter, Selinda Eon.  Ms. Cupples' heart rates have been in the 90s-110s this evening; she has noted intermittent palpitations with this.  She denies any CP or SOB.  Her blood pressures have been running in the 160s-170s.  The patient just took her evening carvedilol shortly before.  I advised pt's daughter that once carvedilol took effect and once she slept, her heart rates would likely get better.  I advised her that if her mother were to develop CP, SOB, or hypotension, she should bring her to the emergency department for evaluation.  She was in agreement with the plan.  She will call the office in the AM if heart rates are still running high.      Reviewed medications with daughter and patient is taking as directed.  Advised to continue same dose of medications and continue to monitor, call back if any further problems. Will forward to Dr Stanford Breed for review

## 2016-09-26 NOTE — Telephone Encounter (Signed)
Pt daughter calling, states that her mother had an episode last where her heart rate kept" jumping up and down." Pt daughter states that the highest it went was 114 and was instructed by the doctor on call to let Dr. Stanford Breed know. Please call to discuss, thanks.

## 2016-10-30 ENCOUNTER — Telehealth: Payer: Self-pay | Admitting: Cardiology

## 2016-10-30 MED ORDER — APIXABAN 2.5 MG PO TABS: 2.5000 mg | ORAL_TABLET | Freq: Two times a day (BID) | ORAL | 3 refills | Status: DC

## 2016-10-30 NOTE — Telephone Encounter (Signed)
°*  STAT* If patient is at the pharmacy, call can be transferred to refill team.   1. Which medications need to be refilled? (please list name of each medication and dose if known) Apixaban 2. Which pharmacy/location (including street and city if local pharmacy) is medication to be sent to?[facility]  3. Do they need a 30 day or 90 day supply? 90 and refills

## 2016-10-30 NOTE — Telephone Encounter (Signed)
Refill sent to the pharmacy electronically.  

## 2016-11-27 NOTE — Progress Notes (Signed)
HPI: Follow-up atrial fibrillation and congestive heart failure. Had fall 12/16. She had rib fractures and fracture of her right scapula. She was treated conservatively. She was noted to be in atrial fibrillation at that time. Echocardiogram revealed Ejection fraction 55-60%, mild aortic insufficiency and mild mitral regurgitation. She was treated with rate control and discharged. Reeadmitted 3/17 with CHF. Echocardiogram repeated and showed ejection fraction 35-40%, biatrial enlargement, moderate aortic insufficiency, severe mitral regurgitation and mild to moderate tricuspid regurgitation. BNP 963. Troponins negative. Patient requested only conservative measures given her age which we thought was appropriate. She also has renal insufficiency. Since last seen, patient denies dyspnea, chest pain, palpitations or syncope.  Current Outpatient Prescriptions  Medication Sig Dispense Refill  . acetaminophen (TYLENOL) 500 MG tablet Take 500 mg by mouth every 6 (six) hours as needed.    [name] apixaban (ELIQUIS) 2.5 MG TABS tablet Take 1 tablet (2.5 mg total) by mouth 2 (two) times daily. 180 tablet 3  . ARTIFICIAL TEARS 0.1-0.3 % SOLN Place 1 drop into both eyes 2 (two) times daily.     . calcium carbonate (OS-CAL) 600 MG TABS Take 600 mg by mouth 2 (two) times daily with a meal.    . carvedilol (COREG) 25 MG tablet Take 25 mg by mouth at bedtime.     . Cholecalciferol (VITAMIN D) 2000 units tablet Take 2,000 Units by mouth daily.    [name] diltiazem (CARDIZEM CD) 120 MG 24 hr capsule Take 1 capsule (120 mg total) by mouth daily. 90 capsule 3  . diphenhydramine-acetaminophen (TYLENOL PM) 25-500 MG TABS tablet Take 1 tablet by mouth at bedtime as needed.    . ferrous sulfate 325 (65 FE) MG tablet Take 325 mg by mouth 2 (two) times daily with a meal.     . furosemide (LASIX) 40 MG tablet Take 1 tablet by mouth in the morning and 1/2 tablet by mouth in the evening 135 tablet 3  . hydrALAZINE (APRESOLINE) 10 MG  tablet Take 1 tablet (10 mg total) by mouth 3 (three) times daily. 270 tablet 3  . LUTEIN PO Take 25 mg by mouth daily.     . Omega-3 Fatty Acids (FISH OIL PO) Take 1 capsule by mouth daily.    . OXYGEN Inhale 3 L into the lungs See admin instructions. Use every night at bedtime and during the day if needed for shortness of breath    . Resveratrol 250 MG CAPS Take 250 mg by mouth daily.    . saxagliptin HCl (ONGLYZA) 2.5 MG TABS tablet Take 2.5 mg by mouth as directed.     No current facility-administered medications for this visit.      Past Medical History:  Diagnosis Date  . Cancer [facility])    breast cancer  . Cardiomyopathy ([place])    a. 04/2015 Echo: EF 55-60%;  b. 07/2015 Echo: EF 35-40%, mod LVH, antsep DK, mod AI, sev MR, sev dil LA, mildly dil RA, mild-mod TR, PASP 24mmHg-->conservatively managed.  . Chronic systolic CHF (congestive heart failure) ([place])    a. 07/2015 Echo: EF 35-40%.  . CKD (chronic kidney disease), stage IV ([place])   . Diabetes mellitus without complication ([place])   . Essential hypertension   . HLD (hyperlipidemia)   . Permanent atrial fibrillation ([place])    a. diagnosed 04/2015 --> conservative mgmt with rate control and xarelto.  . Pulsatile neck mass    a. 11/2015 Neck U/S: pulsatile R neck mass correlates w/ a  toruous and mildy ectatic segment of the R SCA measuring ~ 2.1 cm in max diameter - no change since noted on 12/16 CT.  [name] Severe mitral regurgitation by prior echocardiogram    a. 07/2015 sev MR    Past Surgical History:  Procedure Laterality Date  . ABDOMINAL HYSTERECTOMY    . BREAST LUMPECTOMY    . FRACTURE SURGERY     L femur  . JOINT REPLACEMENT     knees    Social History   Social History  . Marital status: Widowed    Spouse name: N/A  . Number of children: N/A  . Years of education: N/A   Occupational History  . Not on file.   Social History Main Topics  . Smoking status: Never Smoker  . Smokeless tobacco: Never Used  . Alcohol use  No  . Drug use: No  . Sexual activity: No   Other Topics Concern  . Not on file   Social History Narrative  . No narrative on file    Family History  Problem Relation Age of Onset  . Coronary artery disease Father   . Stroke Mother     ROS: no fevers or chills, productive cough, hemoptysis, dysphasia, odynophagia, melena, hematochezia, dysuria, hematuria, rash, seizure activity, orthopnea, PND, pedal edema, claudication. Remaining systems are negative.  Physical Exam: Well-developed frail in no acute distress.  Skin is warm and dry.  HEENT is normal.  Neck is supple.  Chest is clear to auscultation with normal expansion.  Cardiovascular exam is irregular Abdominal exam nontender or distended. No masses palpated. Extremities show no edema. neuro grossly intact   A/P  1 Chronic combined systolic/diastolic congestive heart failure-patient continues to do well from a volume standpoint. We will continue with present dose of Lasix. Check potassium and renal function. We discussed low sodium diet and fluid restriction. She will take an additional 20 mg of Lasix for weight gain of 2-3 pounds.  2 permanent atrial fibrillation-we will continue with Cardizem and carvedilol for rate control. Continue apixaban. Check Hgb.  3 mitral regurgitation-patient has expressed desire previously for only conservative measures and would not consider surgery. She is also a no CODE BLUE.  4 cardiomyopathy-etiology unclear. She does not want aggressive evaluation. Continue beta blocker and hydralazine. No ACE inhibitor given renal insufficiency.  5 hyperlipidemia-management per primary care.  6 chronic stage IV kidney disease-check potassium and renal function.  Kirk Ruths, MD

## 2016-11-28 ENCOUNTER — Other Ambulatory Visit: Payer: Self-pay | Admitting: *Deleted

## 2016-11-28 MED ORDER — HYDRALAZINE HCL 10 MG PO TABS: 10.0000 mg | ORAL_TABLET | Freq: Three times a day (TID) | ORAL | 3 refills | Status: DC

## 2016-11-28 NOTE — Telephone Encounter (Signed)
Patients daughter, Sharyn Lull left a msg on the refill vm requesting a refill on hydralazine be sent to [facility]. She would like a  call at [phone] when this has been sent in. Thanks, MI

## 2016-12-11 ENCOUNTER — Encounter: Payer: Self-pay | Admitting: Cardiology

## 2016-12-11 ENCOUNTER — Ambulatory Visit (INDEPENDENT_AMBULATORY_CARE_PROVIDER_SITE_OTHER): Payer: Medicare Other | Admitting: Cardiology

## 2016-12-11 VITALS — BP 126/68 | HR 72 | Ht 61.0 in | Wt 129.0 lb

## 2016-12-11 DIAGNOSIS — I420 Dilated cardiomyopathy: Secondary | ICD-10-CM

## 2016-12-11 DIAGNOSIS — I481 Persistent atrial fibrillation: Secondary | ICD-10-CM | POA: Diagnosis not present

## 2016-12-11 DIAGNOSIS — I4891 Unspecified atrial fibrillation: Secondary | ICD-10-CM | POA: Diagnosis not present

## 2016-12-11 DIAGNOSIS — I4819 Other persistent atrial fibrillation: Secondary | ICD-10-CM

## 2016-12-11 DIAGNOSIS — N184 Chronic kidney disease, stage 4 (severe): Secondary | ICD-10-CM

## 2016-12-11 DIAGNOSIS — I5042 Chronic combined systolic (congestive) and diastolic (congestive) heart failure: Secondary | ICD-10-CM

## 2016-12-11 NOTE — Patient Instructions (Signed)

## 2016-12-12 LAB — BASIC METABOLIC PANEL
BUN/Creatinine Ratio: 22 (ref 12–28)
BUN: 44 mg/dL — ABNORMAL HIGH (ref 8–27)
CALCIUM: 9.9 mg/dL (ref 8.7–10.3)
CO2: 23 mmol/L (ref 20–29)
Chloride: 95 mmol/L — ABNORMAL LOW (ref 96–106)
Creatinine, Ser: 2.01 mg/dL — ABNORMAL HIGH (ref 0.57–1.00)
GFR calc Af Amer: 25 mL/min/{1.73_m2} — ABNORMAL LOW (ref >59)
GFR, EST NON AFRICAN AMERICAN: 22 mL/min/{1.73_m2} — AB (ref >59)
GLUCOSE: 82 mg/dL (ref 65–99)
Potassium: 4.9 mmol/L (ref 3.5–5.2)
SODIUM: 137 mmol/L (ref 134–144)

## 2016-12-12 LAB — CBC
HEMOGLOBIN: 12.2 g/dL (ref 11.1–15.9)
Hematocrit: 35.7 % (ref 34.0–46.6)
MCH: 30.6 pg (ref 26.6–33.0)
MCHC: 34.2 g/dL (ref 31.5–35.7)
MCV: 90 fL (ref 79–97)
Platelets: 239 10*3/uL (ref 150–379)
RBC: 3.99 x10E6/uL (ref 3.77–5.28)
RDW: 13.7 % (ref 12.3–15.4)
WBC: 7.3 10*3/uL (ref 3.4–10.8)

## 2016-12-13 ENCOUNTER — Ambulatory Visit (INDEPENDENT_AMBULATORY_CARE_PROVIDER_SITE_OTHER): Payer: Medicare Other | Admitting: Podiatry

## 2016-12-13 ENCOUNTER — Encounter: Payer: Self-pay | Admitting: *Deleted

## 2016-12-13 DIAGNOSIS — M79676 Pain in unspecified toe(s): Secondary | ICD-10-CM | POA: Diagnosis not present

## 2016-12-13 DIAGNOSIS — L84 Corns and callosities: Secondary | ICD-10-CM | POA: Diagnosis not present

## 2016-12-13 DIAGNOSIS — E1140 Type 2 diabetes mellitus with diabetic neuropathy, unspecified: Secondary | ICD-10-CM | POA: Diagnosis not present

## 2016-12-13 DIAGNOSIS — B351 Tinea unguium: Secondary | ICD-10-CM

## 2016-12-13 NOTE — Patient Instructions (Signed)

## 2016-12-13 NOTE — Progress Notes (Signed)
Patient ID: [name], female   DOB: [birth year], 83 y.o.   MRN: [id number]   Subjective: This patient presents again with daughter present in the treatment room complaining of thickened and elongated toenails are uncomfortable when walking and wearing shoes and requests toenail debridement. Also, patient complaining of uncomfortable callouses right foot.. Patient recovering from fall since the visit of 02/23/2015 a history of shoulder fracture and rib fracture. Patient also complains of a corn on the fifth left toe  Objective: Orientated 3 Varicose veins ankles bilaterally DP and PT pulses 2/4 bilaterally Capillary reflex immediate bilaterally Sensation to 10 g monofilament wire intact 3/5 right 4/5 left Vibratory sensation reactive bilaterally Ankle reflexes reactive bilaterally Atrophic skin bilaterally in hair growth bilaterally No open skin lesions bilaterally The toenails are elongated, brittle, discolored, deformed, tender to palpation 6-10 Keratoses plantar right first MPJ  callus distal third right toe Corn fifth left toe HAV bilaterally  Assessment: Diabetic with a history neuropathy  Symptomatic onychomycoses 6-10 Corns 2 and calluses 1  Plan: Debridement toenails 6-10 mechanically and electronically without a bleeding Debrided keratoses 3 without any bleeding Continue wearing diabetic shoes  Reappoint 3 months

## 2016-12-14 ENCOUNTER — Ambulatory Visit: Payer: Medicare Other | Admitting: Cardiology

## 2016-12-28 ENCOUNTER — Ambulatory Visit (INDEPENDENT_AMBULATORY_CARE_PROVIDER_SITE_OTHER): Payer: Medicare Other | Admitting: Orthopedic Surgery

## 2016-12-28 ENCOUNTER — Encounter (INDEPENDENT_AMBULATORY_CARE_PROVIDER_SITE_OTHER): Payer: Self-pay | Admitting: Orthopedic Surgery

## 2016-12-28 ENCOUNTER — Ambulatory Visit (INDEPENDENT_AMBULATORY_CARE_PROVIDER_SITE_OTHER): Payer: Medicare Other

## 2016-12-28 DIAGNOSIS — M25562 Pain in left knee: Secondary | ICD-10-CM

## 2016-12-28 LAB — CBC WITH DIFFERENTIAL/PLATELET
BASOS PCT: 0 %
Basophils Absolute: 0 cells/uL (ref 0–200)
EOS PCT: 4 %
Eosinophils Absolute: 340 cells/uL (ref 15–500)
HCT: 33.5 % — ABNORMAL LOW (ref 35.0–45.0)
HEMOGLOBIN: 11.3 g/dL — AB (ref 11.7–15.5)
LYMPHS ABS: 1190 {cells}/uL (ref 850–3900)
LYMPHS PCT: 14 %
MCH: 30.3 pg (ref 27.0–33.0)
MCHC: 33.7 g/dL (ref 32.0–36.0)
MCV: 89.8 fL (ref 80.0–100.0)
MPV: 8.9 fL (ref 7.5–12.5)
Monocytes Absolute: 850 cells/uL (ref 200–950)
Monocytes Relative: 10 %
NEUTROS PCT: 72 %
Neutro Abs: 6120 cells/uL (ref 1500–7800)
Platelets: 278 10*3/uL (ref 140–400)
RBC: 3.73 MIL/uL — AB (ref 3.80–5.10)
RDW: 13.3 % (ref 11.0–15.0)
WBC: 8.5 10*3/uL (ref 3.8–10.8)

## 2016-12-28 NOTE — Progress Notes (Signed)
Office Visit Note   Patient: [name]           Date of Birth: Dec 20, 1925           MRN: [id number] Visit Date: 12/28/2016 Requested by: Bernerd Limbo, MD [address] [place],  [address] PCP: Bernerd Limbo, MD  Subjective: Chief Complaint  Patient presents with  . Left Knee - Pain    HPI: [name] is a patient with left knee pain.  Denies any history of injury.  Reports pain over the last 4 days.  She describes swelling and weakness and difficulty weightbearing.  Denies any back pain or numbness and tingling.  Taking, for symptoms.  Has had previous left distal femoral replacement about 9 years ago.  This is actually the first time she is having any symptoms with that left knee.  She is however doing a little bit better today.              ROS: All systems reviewed are negative as they relate to the chief complaint within the history of present illness.  Patient denies  fevers or chills.   Assessment & Plan: Visit Diagnoses:  1. Acute pain of left knee     Plan: Impression is left knee pain with previously well-functioning distal femoral replacement.  Trace effusion is present today but no real warmth when comparing left knee to right knee.  The knee itself is also quite functional.  No nerve retention signs.  Plan is to draw CBC differential separate C-reactive protein today.  Follow-up next week with one of my partners Dr. Sherrian Divers for further evaluation and management.  He has experience with distal femoral replacements.  Note is made that the superior aspect of the prosthesis is not imaged today but all percent thumbs are in the knee region.  Follow-Up Instructions: No Follow-up on file.   Orders:  Orders Placed This Encounter  Procedures  . XR Knee 1-2 Views Left  . CBC with Differential  . Sed Rate (ESR)  . C-reactive protein   No orders of the defined types were placed in this encounter.     Procedures: No procedures performed   Clinical Data: No  additional findings.  Objective: Vital Signs: There were no vitals taken for this visit.  Physical Exam:   Constitutional: Patient appears well-developed HEENT:  Head: Normocephalic Eyes:EOM are normal Neck: Normal range of motion Cardiovascular: Normal rate Pulmonary/chest: Effort normal Neurologic: Patient is alert Skin: Skin is warm Psychiatric: Patient has normal mood and affect    Ortho Exam: Orthopedic exam demonstrates normal gait alignment range of motion on the left knee flexion past 90 extensor mechanism is intact trace effusion is present pedal pulses palpable venous varicosities present but no calf tenderness is noted.  No groin pain with internal/external rotation of the leg.  Collaterals are stable.  Specialty Comments:  No specialty comments available.  Imaging: Xr Knee 1-2 Views Left  Result Date: 12/28/2016 Left knee AP lateral reviewed.  Distal femoral replacement in good position and alignment.  No complicating features noted.  No fractures or ostial lysis present.    PMFS History: Patient Active Problem List   Diagnosis Date Noted  . Essential hypertension   . HLD (hyperlipidemia)   . PAF (paroxysmal atrial fibrillation) ([place])   . Cardiomyopathy ([place])   . Mitral regurgitation 08/24/2015  . Congestive dilated cardiomyopathy ([place]) 08/24/2015  . Congestive heart failure (CHF) ([place]) 08/24/2015  . Acute on chronic congestive heart failure ([place])   .  CHF exacerbation ([place]) 08/02/2015  . Elevated lactic acid level 08/02/2015  . Increased lactic acid level 08/02/2015  . Dyspnea 08/02/2015  . AKI (acute kidney injury) ([place]) 05/10/2015  .  Rib fractures - right #8-10 05/09/2015  . Atrial fibrillation with rapid ventricular response ([place]) 05/09/2015  . Scapula fracture 05/09/2015  . Fall 05/09/2015  . Essential hypertension, benign 05/09/2015  . Diabetes mellitus without complication ([place]) 62/22/9798  . History of breast cancer 05/09/2015   Past Medical  History:  Diagnosis Date  . Cancer [facility])    breast cancer  . Cardiomyopathy ([place])    a. 04/2015 Echo: EF 55-60%;  b. 07/2015 Echo: EF 35-40%, mod LVH, antsep DK, mod AI, sev MR, sev dil LA, mildly dil RA, mild-mod TR, PASP 5mHg-->conservatively managed.  . Chronic systolic CHF (congestive heart failure) ([place]    a. 07/2015 Echo: EF 35-40%.  . CKD (chronic kidney disease), stage IV ([place]   . Diabetes mellitus without complication ([place]   . Essential hypertension   . HLD (hyperlipidemia)   . Permanent atrial fibrillation ([place]    a. diagnosed 04/2015 --> conservative mgmt with rate control and xarelto.  . Pulsatile neck mass    a. 11/2015 Neck U/S: pulsatile R neck mass correlates w/ a toruous and mildy ectatic segment of the R SCA measuring ~ 2.1 cm in max diameter - no change since noted on 12/16 CT.  .Marland KitchenSevere mitral regurgitation by prior echocardiogram    a. 07/2015 sev MR    Family History  Problem Relation Age of Onset  . Coronary artery disease Father   . Stroke Mother     Past Surgical History:  Procedure Laterality Date  . ABDOMINAL HYSTERECTOMY    . BREAST LUMPECTOMY    . FRACTURE SURGERY     L femur  . JOINT REPLACEMENT     knees   Social History   Occupational History  . Not on file.   Social History Main Topics  . Smoking status: Never Smoker  . Smokeless tobacco: Never Used  . Alcohol use No  . Drug use: No  . Sexual activity: No

## 2016-12-29 LAB — SEDIMENTATION RATE: SED RATE: 49 mm/h — AB (ref 0–30)

## 2016-12-29 LAB — C-REACTIVE PROTEIN: CRP: 148.2 mg/L — AB (ref <8.0)

## 2017-01-02 ENCOUNTER — Encounter (INDEPENDENT_AMBULATORY_CARE_PROVIDER_SITE_OTHER): Payer: Self-pay | Admitting: Orthopaedic Surgery

## 2017-01-02 ENCOUNTER — Ambulatory Visit (INDEPENDENT_AMBULATORY_CARE_PROVIDER_SITE_OTHER): Payer: Medicare Other | Admitting: Orthopaedic Surgery

## 2017-01-02 DIAGNOSIS — M25562 Pain in left knee: Secondary | ICD-10-CM

## 2017-01-02 NOTE — Progress Notes (Signed)
Office Visit Note   Patient: [name]           Date of Birth: 1925/11/30           MRN: [id number] Visit Date: 01/02/2017              Requested by: Bernerd Limbo, MD [address] [place], [place] [address] PCP: Bernerd Limbo, MD   Assessment & Plan: Visit Diagnoses:  1. Acute pain of left knee     Plan: Patient has improved from her left knee pain. She is asymptomatic and is not demonstrating any evidence of infection. Follow-up as needed.  Follow-Up Instructions: Return if symptoms worsen or fail to improve.   Orders:  No orders of the defined types were placed in this encounter.  No orders of the defined types were placed in this encounter.     Procedures: No procedures performed   Clinical Data: No additional findings.   Subjective: Chief Complaint  Patient presents with  . Left Knee - Pain    Patient comes in today for follow-up of her left knee pain. She states that today she is doing very well and has essentially no pain. She denies any fevers or chills or constitutional symptoms. Her left distal femur replacement was done by Dr. Ennis Forts in [place].    Review of Systems   Objective: Vital Signs: There were no vitals taken for this visit.  Physical Exam  Ortho Exam Left knee exam shows a fully healed surgical scar. She has full painless range of motion of the knee. There is no swelling or cellulitis or evidence of infection. Specialty Comments:  No specialty comments available.  Imaging: No results found.   PMFS History: Patient Active Problem List   Diagnosis Date Noted  . Essential hypertension   . HLD (hyperlipidemia)   . PAF (paroxysmal atrial fibrillation) ([place])   . Cardiomyopathy ([place])   . Mitral regurgitation 08/24/2015  . Congestive dilated cardiomyopathy ([place]) 08/24/2015  . Congestive heart failure (CHF) ([place]) 08/24/2015  . Acute on chronic congestive heart failure ([place])   . CHF exacerbation ([place]) 08/02/2015    . Elevated lactic acid level 08/02/2015  . Increased lactic acid level 08/02/2015  . Dyspnea 08/02/2015  . AKI (acute kidney injury) ([place]) 05/10/2015  .  Rib fractures - right #8-10 05/09/2015  . Atrial fibrillation with rapid ventricular response ([place]) 05/09/2015  . Scapula fracture 05/09/2015  . Fall 05/09/2015  . Essential hypertension, benign 05/09/2015  . Diabetes mellitus without complication ([place]) 46/56/8127  . History of breast cancer 05/09/2015   Past Medical History:  Diagnosis Date  . Cancer [facility])    breast cancer  . Cardiomyopathy ([place])    a. 04/2015 Echo: EF 55-60%;  b. 07/2015 Echo: EF 35-40%, mod LVH, antsep DK, mod AI, sev MR, sev dil LA, mildly dil RA, mild-mod TR, PASP 69mmHg-->conservatively managed.  . Chronic systolic CHF (congestive heart failure) ([place])    a. 07/2015 Echo: EF 35-40%.  . CKD (chronic kidney disease), stage IV ([place])   . Diabetes mellitus without complication ([place])   . Essential hypertension   . HLD (hyperlipidemia)   . Permanent atrial fibrillation ([place])    a. diagnosed 04/2015 --> conservative mgmt with rate control and xarelto.  . Pulsatile neck mass    a. 11/2015 Neck U/S: pulsatile R neck mass correlates w/ a toruous and mildy ectatic segment of the R SCA measuring ~ 2.1 cm in max diameter - no change since noted  on 12/16 CT.  [name] Severe mitral regurgitation by prior echocardiogram    a. 07/2015 sev MR    Family History  Problem Relation Age of Onset  . Coronary artery disease Father   . Stroke Mother     Past Surgical History:  Procedure Laterality Date  . ABDOMINAL HYSTERECTOMY    . BREAST LUMPECTOMY    . FRACTURE SURGERY     L femur  . JOINT REPLACEMENT     knees   Social History   Occupational History  . Not on file.   Social History Main Topics  . Smoking status: Never Smoker  . Smokeless tobacco: Never Used  . Alcohol use No  . Drug use: No  . Sexual activity: No

## 2017-03-05 IMAGING — DX DG CHEST 2V
2 series · 3 of 3 positions shown · non-contrast
Comparison: CT chest dated 05/09/2015

CLINICAL DATA: Hypoxia, weakness

EXAM:
CHEST  2 VIEW

[Series 2: chest lat · 0.14mm/px · 2 of 2 slices shown]
[im 1/2]
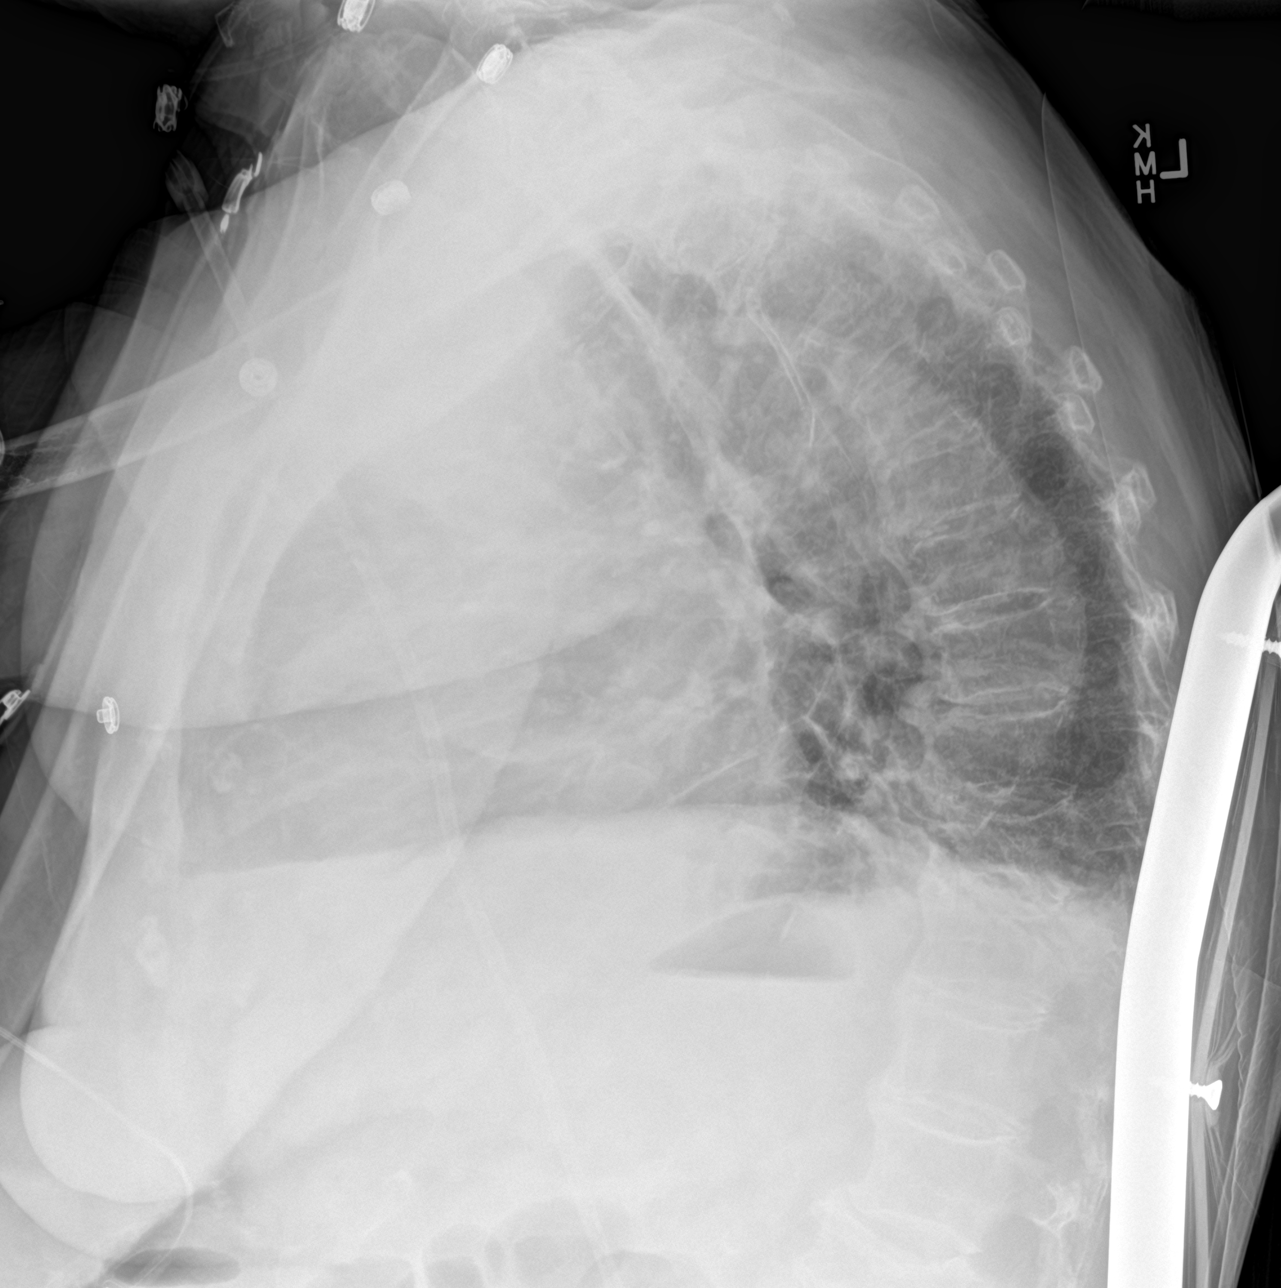
[im 2/2]
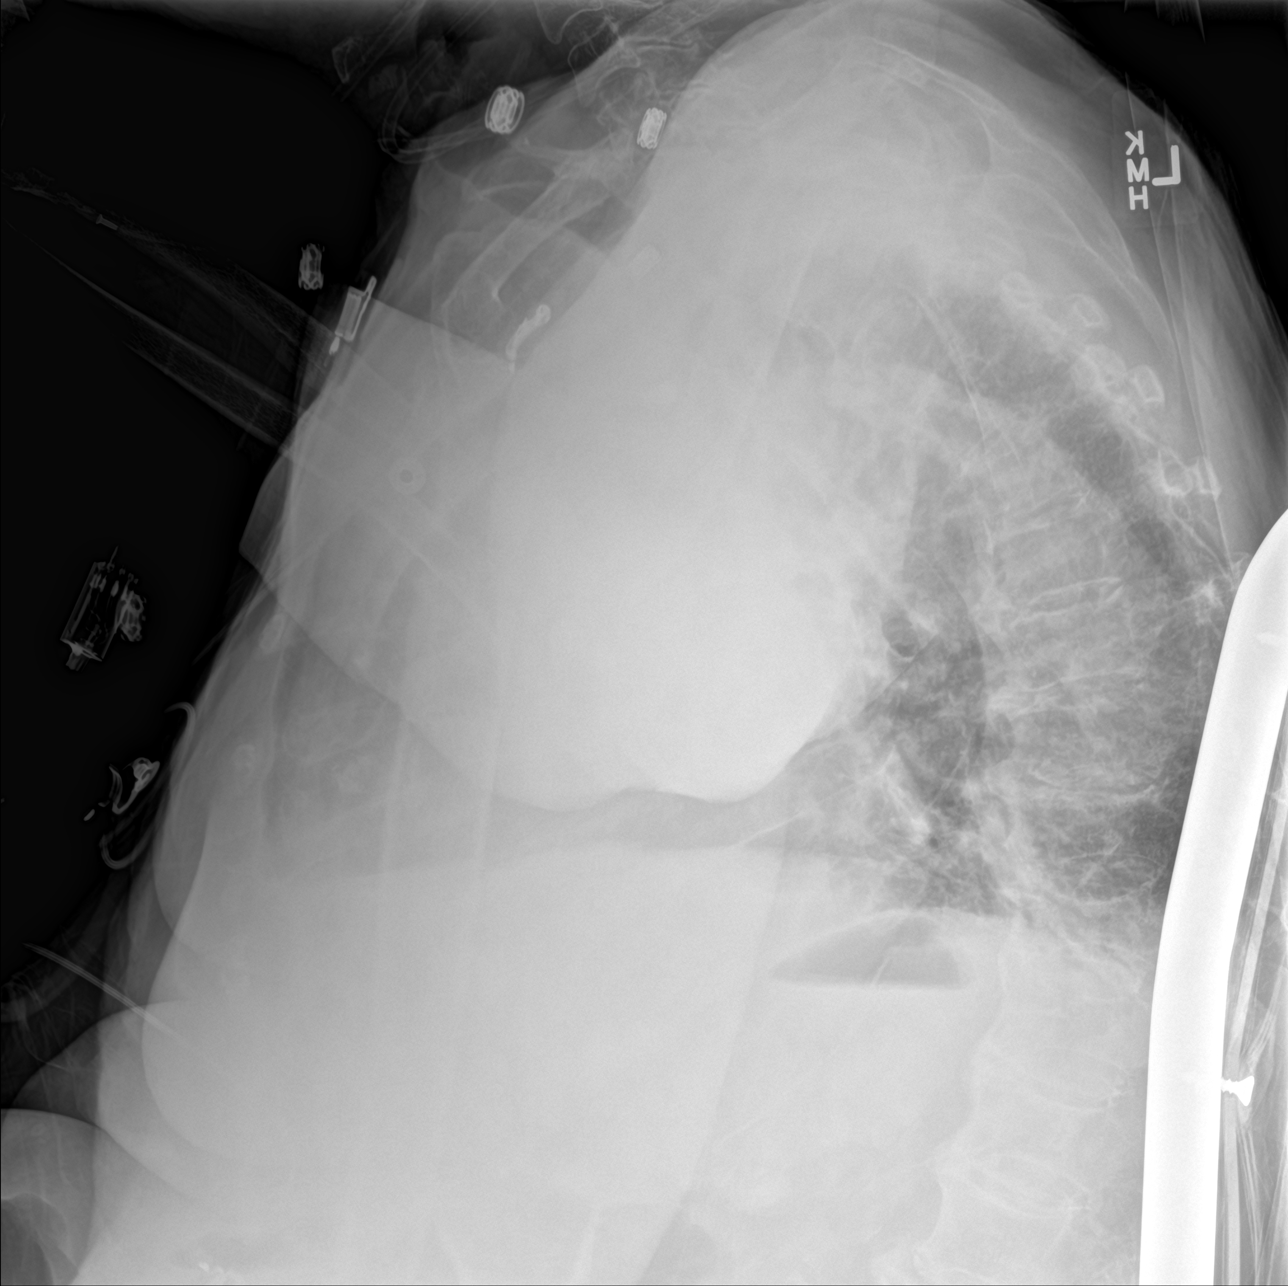

[chest ap]
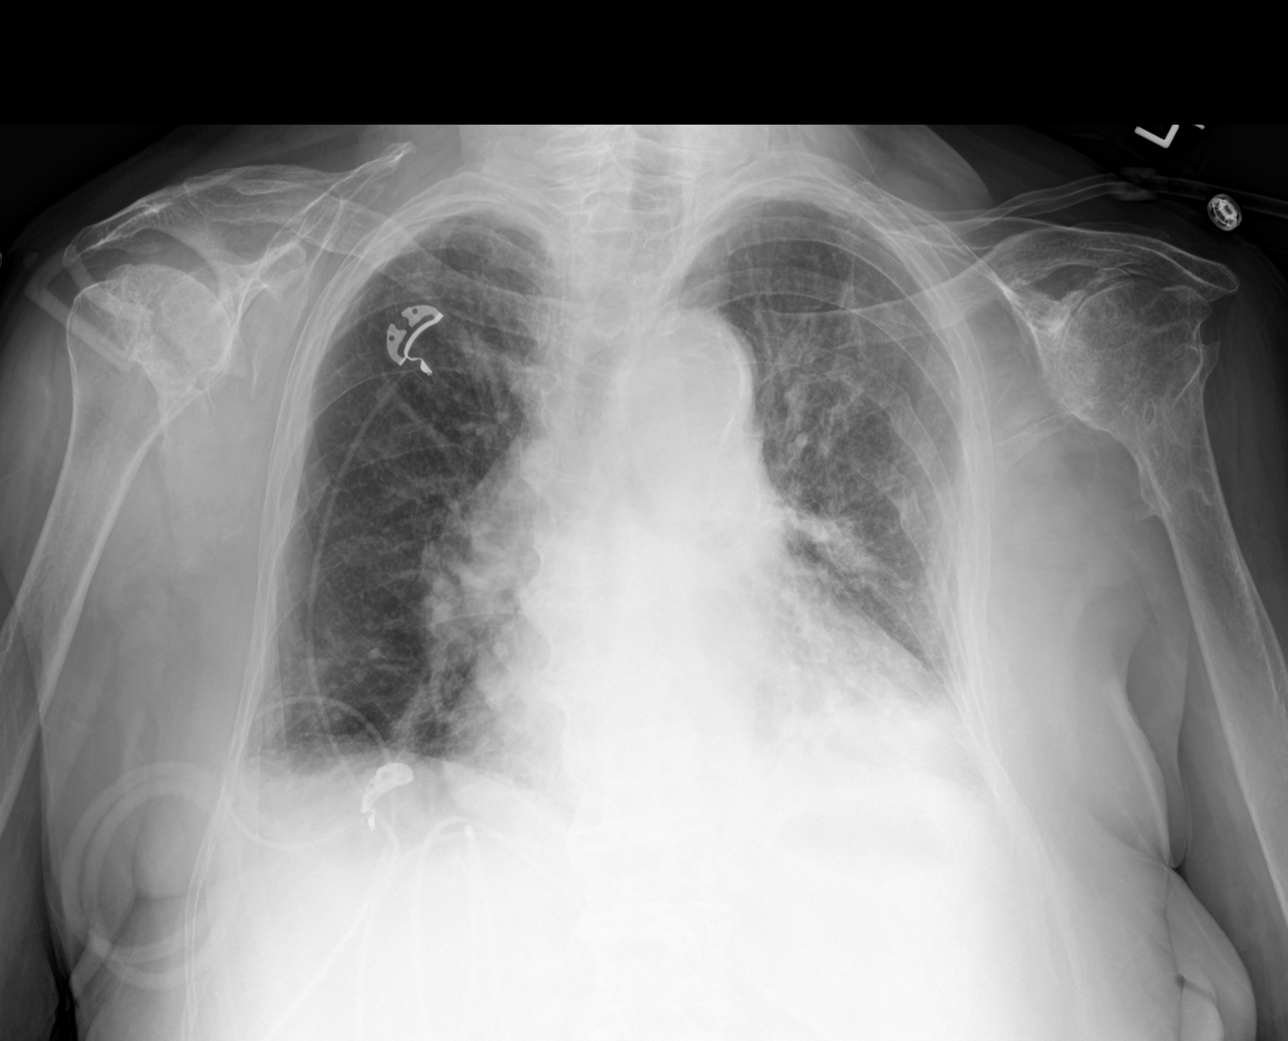

[3 of 3 positions shown; findings below may reference images not displayed]

FINDINGS: Patchy left lower lobe opacity, likely atelectasis. Mild right
basilar opacity, likely atelectasis. No definite pleural effusion.
No pneumothorax.

Cardiomegaly.

Multiple old left rib fracture deformities. Degenerative changes of
the bilateral shoulders. Known inferior right scapular fracture,
better evaluated on CT.
IMPRESSION: Patchy left lower lobe opacity, likely atelectasis. Right basilar
atelectasis.

Right inferior scapular fracture, better evaluated on CT.

No pneumothorax.

## 2017-03-19 ENCOUNTER — Ambulatory Visit (INDEPENDENT_AMBULATORY_CARE_PROVIDER_SITE_OTHER): Payer: Medicare Other | Admitting: Podiatry

## 2017-03-19 ENCOUNTER — Encounter: Payer: Self-pay | Admitting: Podiatry

## 2017-03-19 DIAGNOSIS — B351 Tinea unguium: Secondary | ICD-10-CM

## 2017-03-19 DIAGNOSIS — M79676 Pain in unspecified toe(s): Secondary | ICD-10-CM

## 2017-03-19 DIAGNOSIS — E1140 Type 2 diabetes mellitus with diabetic neuropathy, unspecified: Secondary | ICD-10-CM

## 2017-03-19 DIAGNOSIS — L84 Corns and callosities: Secondary | ICD-10-CM

## 2017-03-19 NOTE — Patient Instructions (Signed)

## 2017-03-19 NOTE — Progress Notes (Signed)
Patient ID: [name], female   DOB: 05-24-1926, 83 y.o.   MRN: [id number]   Subjective: This patient presents again with daughter present in the treatment room complaining of thickened and elongated toenails are uncomfortable when walking and wearing shoes and requests toenail debridement. Also, patient complaining of uncomfortable callouses right foot.. Patient recovering from fall since the visit of 02/23/2015 a history of shoulder fracture and rib fracture. Patient also complains of a corn on the fifth left toe  Objective: Orientated 3 Varicose veins ankles bilaterally DP and PT pulses 2/4 bilaterally Capillary reflex immediate bilaterally Sensation to 10 g monofilament wire intact 3/5 right 4/5 left Vibratory sensation reactive bilaterally Ankle reflexes reactive bilaterally Atrophic skin bilaterally in hair growth bilaterally No open skin lesions bilaterally The toenails are elongated, brittle, discolored, deformed, tender to palpation 6-10 Keratoses plantar right first MPJ  callus distal third right toe Corn fifth left toe HAV bilaterally  Assessment: Diabetic with a history neuropathy  Symptomatic onychomycoses 6-10 Corns 2 and calluses 1  Plan: Debridement toenails 6-10 mechanically and electronically without a bleeding Debrided keratoses 3 without any bleeding Pad applied to the fifth left toe Continue wearing diabetic shoes  Reappoint 3 months

## 2017-06-18 ENCOUNTER — Encounter: Payer: Self-pay | Admitting: Podiatry

## 2017-06-18 ENCOUNTER — Ambulatory Visit (INDEPENDENT_AMBULATORY_CARE_PROVIDER_SITE_OTHER): Payer: Medicare Other | Admitting: Podiatry

## 2017-06-18 DIAGNOSIS — B351 Tinea unguium: Secondary | ICD-10-CM | POA: Diagnosis not present

## 2017-06-18 DIAGNOSIS — L84 Corns and callosities: Secondary | ICD-10-CM

## 2017-06-18 DIAGNOSIS — M79674 Pain in right toe(s): Secondary | ICD-10-CM | POA: Diagnosis not present

## 2017-06-18 DIAGNOSIS — E1140 Type 2 diabetes mellitus with diabetic neuropathy, unspecified: Secondary | ICD-10-CM | POA: Diagnosis not present

## 2017-06-18 DIAGNOSIS — M79675 Pain in left toe(s): Secondary | ICD-10-CM | POA: Diagnosis not present

## 2017-06-18 DIAGNOSIS — D689 Coagulation defect, unspecified: Secondary | ICD-10-CM

## 2017-06-18 NOTE — Progress Notes (Signed)
Complaint:  Visit Type: Patient returns to my office for continued preventative foot care services. Complaint: Patient states" my nails have grown long and thick and become painful to walk and wear shoes" Patient has been diagnosed with DM . The patient presents for preventative foot care services. No changes to ROS.  Patient is taking eliquiss.  Podiatric Exam: Vascular: dorsalis pedis and posterior tibial pulses are palpable bilateral. Capillary return is immediate. Temperature gradient is WNL. Skin turgor WNL  Sensorium: Normal Semmes Weinstein monofilament test. Normal tactile sensation bilaterally. Nail Exam: Pt has thick disfigured discolored nails with subungual debris noted bilateral entire nail hallux through fifth toenails Ulcer Exam: There is no evidence of ulcer or pre-ulcerative changes or infection. Orthopedic Exam: Muscle tone and strength are WNL. No limitations in general ROM. No crepitus or effusions noted. HAV  B/L.   Skin: No Porokeratosis. No infection or ulcers.  Corn fifth left and third right.  Callus sub 1 right. Diagnosis:  Onychomycosis, , Pain in right toe, pain in left toes,  Debride callus.  Treatment & Plan Procedures and Treatment: Consent by patient was obtained for treatment procedures.   Debridement of mycotic and hypertrophic toenails, 1 through 5 bilateral and clearing of subungual debris. No ulceration, no infection noted.  Return Visit-Office Procedure: Patient instructed to return to the office for a follow up visit 3 months for continued evaluation and treatment.    Brandis Wixted DPM 

## 2017-06-19 ENCOUNTER — Ambulatory Visit: Payer: Medicare Other | Admitting: Podiatry

## 2017-06-28 NOTE — Progress Notes (Signed)
HPI: Follow-up atrial fibrillation and congestive heart failure. Had fall 12/16. She had rib fractures and fracture of her right scapula. She was treated conservatively. She was noted to be in atrial fibrillation at that time. Echocardiogram revealed Ejection fraction 55-60%, mild aortic insufficiency and mild mitral regurgitation. She was treated with rate control and discharged. Reeadmitted 3/17 with CHF. Echocardiogram repeated and showed ejection fraction 35-40%, biatrial enlargement, moderate aortic insufficiency, severe mitral regurgitation and mild to moderate tricuspid regurgitation. BNP 963. Troponins negative. Patient requested only conservative measures given her age which we thought was appropriate. She also has renal insufficiency. Since last seen, the patient has dyspnea with more extreme activities but not with routine activities. It is relieved with rest. It is not associated with chest pain. There is no orthopnea, PND or pedal edema. There is no syncope or palpitations. There is no exertional chest pain.   Current Outpatient Medications  Medication Sig Dispense Refill  . acetaminophen (TYLENOL) 500 MG tablet Take 500 mg by mouth every 6 (six) hours as needed.    [name] apixaban (ELIQUIS) 2.5 MG TABS tablet Take 1 tablet (2.5 mg total) by mouth 2 (two) times daily. 180 tablet 3  . ARTIFICIAL TEARS 0.1-0.3 % SOLN Place 1 drop into both eyes 2 (two) times daily.     . carvedilol (COREG) 25 MG tablet Take 25 mg by mouth at bedtime.     . Cholecalciferol (VITAMIN D) 2000 units tablet Take 2,000 Units by mouth daily.    [name] diltiazem (CARDIZEM CD) 120 MG 24 hr capsule Take 1 capsule (120 mg total) by mouth daily. 90 capsule 3  . diphenhydramine-acetaminophen (TYLENOL PM) 25-500 MG TABS tablet Take 1 tablet by mouth at bedtime as needed.    . ferrous sulfate 325 (65 FE) MG tablet Take 325 mg by mouth 2 (two) times daily with a meal.     . furosemide (LASIX) 40 MG tablet Take 1 tablet by  mouth in the morning and 1/2 tablet by mouth in the evening 135 tablet 3  . hydrALAZINE (APRESOLINE) 10 MG tablet Take 1 tablet (10 mg total) by mouth 3 (three) times daily. 270 tablet 3  . LUTEIN PO Take 25 mg by mouth daily.     . Omega-3 Fatty Acids (FISH OIL PO) Take 1 capsule by mouth daily.    . OXYGEN Inhale 3 L into the lungs See admin instructions. Use every night at bedtime and during the day if needed for shortness of breath    . Resveratrol 250 MG CAPS Take 250 mg by mouth daily.    . saxagliptin HCl (ONGLYZA) 2.5 MG TABS tablet Take 2.5 mg by mouth as directed.     No current facility-administered medications for this visit.      Past Medical History:  Diagnosis Date  . Cancer [facility])    breast cancer  . Cardiomyopathy ([place])    a. 04/2015 Echo: EF 55-60%;  b. 07/2015 Echo: EF 35-40%, mod LVH, antsep DK, mod AI, sev MR, sev dil LA, mildly dil RA, mild-mod TR, PASP 32mmHg-->conservatively managed.  . Chronic systolic CHF (congestive heart failure) ([place])    a. 07/2015 Echo: EF 35-40%.  . CKD (chronic kidney disease), stage IV ([place])   . Diabetes mellitus without complication ([place])   . Essential hypertension   . HLD (hyperlipidemia)   . Permanent atrial fibrillation ([place])    a. diagnosed 04/2015 --> conservative mgmt with rate control and xarelto.  . Pulsatile  neck mass    a. 11/2015 Neck U/S: pulsatile R neck mass correlates w/ a toruous and mildy ectatic segment of the R SCA measuring ~ 2.1 cm in max diameter - no change since noted on 12/16 CT.  [name] Severe mitral regurgitation by prior echocardiogram    a. 07/2015 sev MR    Past Surgical History:  Procedure Laterality Date  . ABDOMINAL HYSTERECTOMY    . BREAST LUMPECTOMY    . FRACTURE SURGERY     L femur  . JOINT REPLACEMENT     knees    Social History   Socioeconomic History  . Marital status: Widowed    Spouse name: Not on file  . Number of children: Not on file  . Years of education: Not on file  . Highest  education level: Not on file  Social Needs  . Financial resource strain: Not on file  . Food insecurity - worry: Not on file  . Food insecurity - inability: Not on file  . Transportation needs - medical: Not on file  . Transportation needs - non-medical: Not on file  Occupational History  . Not on file  Tobacco Use  . Smoking status: Never Smoker  . Smokeless tobacco: Never Used  Substance and Sexual Activity  . Alcohol use: No    Alcohol/week: 0.0 oz  . Drug use: No  . Sexual activity: No  Other Topics Concern  . Not on file  Social History Narrative  . Not on file    Family History  Problem Relation Age of Onset  . Coronary artery disease Father   . Stroke Mother     ROS: no fevers or chills, productive cough, hemoptysis, dysphasia, odynophagia, melena, hematochezia, dysuria, hematuria, rash, seizure activity, orthopnea, PND, pedal edema, claudication. Remaining systems are negative.  Physical Exam: Well-developed elderly in no acute distress.  Skin is warm and dry.  HEENT is normal.  Neck is supple.  Chest is clear to auscultation with normal expansion.  Cardiovascular exam is irregular Abdominal exam nontender or distended. No masses palpated. Extremities show no edema. Varicosities noted neuro grossly intact  ECG- Atrial fibrillation; LBBB; personally reviewed  A/P  1 chronic combined systolic/diastolic congestive heart failure-patient remains euvolemic from a cardiac standpoint.  We will continue with present dose of Lasix.  We discussed the importance of low-sodium diet and fluid restriction.  Take an additional 20 mg of Lasix daily for weight gain of 2-3 pounds.  Check potassium and renal function.  2 permanent atrial fibrillation-continue calcium blocker and beta-blocker for rate control.  Continue apixaban.  Check hemoglobin.  3 cardiomyopathy-etiology unclear but patient has declined aggressive evaluation previously.  She does not want further imaging  studies. We will continue with hydralazine and beta-blocker. Add imdur 30 mg daily. She is not on an ARB given severity of renal insufficiency.  4 mitral regurgitation-patient only wants conservative measures and would not consider aggressive intervention.  She is a no CODE BLUE.  5 hyperlipidemia-managed by primary care.  6 chronic stage IV kidney disease-we will repeat potassium and renal function.  Kirk Ruths, MD

## 2017-07-06 ENCOUNTER — Other Ambulatory Visit: Payer: Self-pay | Admitting: *Deleted

## 2017-07-06 ENCOUNTER — Encounter: Payer: Self-pay | Admitting: Cardiology

## 2017-07-06 ENCOUNTER — Ambulatory Visit (INDEPENDENT_AMBULATORY_CARE_PROVIDER_SITE_OTHER): Payer: Medicare Other | Admitting: Cardiology

## 2017-07-06 VITALS — BP 138/60 | HR 76 | Ht 61.0 in | Wt 129.0 lb

## 2017-07-06 DIAGNOSIS — I5042 Chronic combined systolic (congestive) and diastolic (congestive) heart failure: Secondary | ICD-10-CM

## 2017-07-06 DIAGNOSIS — I340 Nonrheumatic mitral (valve) insufficiency: Secondary | ICD-10-CM | POA: Diagnosis not present

## 2017-07-06 DIAGNOSIS — I4821 Permanent atrial fibrillation: Secondary | ICD-10-CM

## 2017-07-06 DIAGNOSIS — I420 Dilated cardiomyopathy: Secondary | ICD-10-CM | POA: Diagnosis not present

## 2017-07-06 DIAGNOSIS — I482 Chronic atrial fibrillation: Secondary | ICD-10-CM

## 2017-07-06 MED ORDER — ISOSORBIDE MONONITRATE ER 30 MG PO TB24: 30.0000 mg | ORAL_TABLET | Freq: Every day | ORAL | 3 refills | Status: DC

## 2017-07-06 NOTE — Patient Instructions (Signed)
Medication Instructions:   START ISOSORBIDE 30 MG ONCE DAILY=START WITH 1/2 TABLET FOR THE FIRST 3 DAYS THEN INCREASE TO WHOLE TABLET ONCE DAILY  Labwork:  Your physician recommends that you HAVE LAB WORK TODAY  Follow-Up:  Your physician wants you to follow-up in: [place] will receive a reminder letter in the mail two months in advance. If you don't receive a letter, please call our office to schedule the follow-up appointment.   If you need a refill on your cardiac medications before your next appointment, please call your pharmacy.

## 2017-07-07 LAB — BASIC METABOLIC PANEL
BUN/Creatinine Ratio: 24 (ref 12–28)
BUN: 53 mg/dL — ABNORMAL HIGH (ref 8–27)
CO2: 20 mmol/L (ref 20–29)
Calcium: 9.5 mg/dL (ref 8.7–10.3)
Chloride: 104 mmol/L (ref 96–106)
Creatinine, Ser: 2.23 mg/dL — ABNORMAL HIGH (ref 0.57–1.00)
GFR, EST AFRICAN AMERICAN: 22 mL/min/{1.73_m2} — AB (ref >59)
GFR, EST NON AFRICAN AMERICAN: 19 mL/min/{1.73_m2} — AB (ref >59)
Glucose: 118 mg/dL — ABNORMAL HIGH (ref 65–99)
Potassium: 4.3 mmol/L (ref 3.5–5.2)
SODIUM: 141 mmol/L (ref 134–144)

## 2017-07-07 LAB — CBC
HEMATOCRIT: 33.6 % — AB (ref 34.0–46.6)
Hemoglobin: 11.1 g/dL (ref 11.1–15.9)
MCH: 30.1 pg (ref 26.6–33.0)
MCHC: 33 g/dL (ref 31.5–35.7)
MCV: 91 fL (ref 79–97)
Platelets: 226 10*3/uL (ref 150–379)
RBC: 3.69 x10E6/uL — ABNORMAL LOW (ref 3.77–5.28)
RDW: 13.9 % (ref 12.3–15.4)
WBC: 7.7 10*3/uL (ref 3.4–10.8)

## 2017-07-09 ENCOUNTER — Telehealth: Payer: Self-pay | Admitting: *Deleted

## 2017-07-09 MED ORDER — FUROSEMIDE 40 MG PO TABS: ORAL_TABLET | ORAL | 3 refills | Status: DC

## 2017-07-09 NOTE — Telephone Encounter (Addendum)
-----   Message from Lelon Perla, MD sent at 07/09/2017  7:26 AM EST ----- Decrease lasix to 40 mg daily with additional 20 mg daily for edema or weight gain of 2-3 lbs Kirk Ruths  Spoke with pt, she voiced understanding of medication change. She will call with problems.

## 2017-08-30 ENCOUNTER — Other Ambulatory Visit: Payer: Self-pay | Admitting: Cardiology

## 2017-08-30 MED ORDER — DILTIAZEM HCL ER COATED BEADS 120 MG PO CP24: 120.0000 mg | ORAL_CAPSULE | Freq: Every day | ORAL | 1 refills | Status: DC

## 2017-08-30 MED ORDER — FUROSEMIDE 40 MG PO TABS: ORAL_TABLET | ORAL | 1 refills | Status: DC

## 2017-08-30 NOTE — Telephone Encounter (Signed)
°*  STAT* If patient is at the pharmacy, call can be transferred to refill team.   1. Which medications need to be refilled? (please list name of each medication and dose if known)  Needs new prescription for  Apixaban,Furosemide and Diltiazem 2. Which pharmacy/location (including street and city if local pharmacy) is medication to be sent to?[place] Do they need a 30 day or 90 day supply?90 and 180 for Apixaban and refills

## 2017-08-31 MED ORDER — APIXABAN 2.5 MG PO TABS: 2.5000 mg | ORAL_TABLET | Freq: Two times a day (BID) | ORAL | 3 refills | Status: DC

## 2017-09-19 ENCOUNTER — Ambulatory Visit (INDEPENDENT_AMBULATORY_CARE_PROVIDER_SITE_OTHER): Payer: Medicare Other | Admitting: Podiatry

## 2017-09-19 ENCOUNTER — Encounter: Payer: Self-pay | Admitting: Podiatry

## 2017-09-19 DIAGNOSIS — M79674 Pain in right toe(s): Secondary | ICD-10-CM

## 2017-09-19 DIAGNOSIS — B351 Tinea unguium: Secondary | ICD-10-CM | POA: Diagnosis not present

## 2017-09-19 DIAGNOSIS — E1140 Type 2 diabetes mellitus with diabetic neuropathy, unspecified: Secondary | ICD-10-CM | POA: Diagnosis not present

## 2017-09-19 DIAGNOSIS — L84 Corns and callosities: Secondary | ICD-10-CM | POA: Diagnosis not present

## 2017-09-19 DIAGNOSIS — D689 Coagulation defect, unspecified: Secondary | ICD-10-CM

## 2017-09-19 DIAGNOSIS — M79675 Pain in left toe(s): Secondary | ICD-10-CM | POA: Diagnosis not present

## 2017-09-19 NOTE — Progress Notes (Signed)
Complaint:  Visit Type: Patient returns to my office for continued preventative foot care services. Complaint: Patient states" my nails have grown long and thick and become painful to walk and wear shoes" Patient has been diagnosed with DM . The patient presents for preventative foot care services. No changes to ROS.  Patient is taking eliquiss.  Podiatric Exam: Vascular: dorsalis pedis and posterior tibial pulses are palpable bilateral. Capillary return is immediate. Temperature gradient is WNL. Skin turgor WNL  Sensorium: Normal Semmes Weinstein monofilament test. Normal tactile sensation bilaterally. Nail Exam: Pt has thick disfigured discolored nails with subungual debris noted bilateral entire nail hallux through fifth toenails Ulcer Exam: There is no evidence of ulcer or pre-ulcerative changes or infection. Orthopedic Exam: Muscle tone and strength are WNL. No limitations in general ROM. No crepitus or effusions noted. HAV  B/L.   Skin: No Porokeratosis. No infection or ulcers.  Corn fifth left and third right.  Callus sub 1 right. Diagnosis:  Onychomycosis, , Pain in right toe, pain in left toes,  Debride callus.  Treatment & Plan Procedures and Treatment: Consent by patient was obtained for treatment procedures.   Debridement of mycotic and hypertrophic toenails, 1 through 5 bilateral and clearing of subungual debris. No ulceration, no infection noted.  Return Visit-Office Procedure: Patient instructed to return to the office for a follow up visit 3 months for continued evaluation and treatment.    Rolen Conger DPM 

## 2017-09-24 ENCOUNTER — Other Ambulatory Visit: Payer: Self-pay | Admitting: Cardiology

## 2017-09-24 MED ORDER — CARVEDILOL 25 MG PO TABS: 25.0000 mg | ORAL_TABLET | Freq: Every day | ORAL | 2 refills | Status: DC

## 2017-09-24 MED ORDER — HYDRALAZINE HCL 10 MG PO TABS: 10.0000 mg | ORAL_TABLET | Freq: Three times a day (TID) | ORAL | 2 refills | Status: DC

## 2017-09-24 NOTE — Telephone Encounter (Signed)
New message   *STAT* If patient is at the pharmacy, call can be transferred to refill team.   1. Which medications need to be refilled? (please list name of each medication and dose if known) carvedilol (COREG) 25 MG tablet and hydrALAZINE (APRESOLINE) 10 MG tablet  2. Which pharmacy/location (including street and city if local pharmacy) is medication to be sent to?[facility] - [place], [address]  3. Do they need a 30 day or 90 day supply? [place]

## 2017-09-24 NOTE — Telephone Encounter (Signed)
Rx(s) sent to pharmacy electronically.  

## 2017-10-09 ENCOUNTER — Telehealth: Payer: Self-pay | Admitting: Cardiology

## 2017-10-09 DIAGNOSIS — I5042 Chronic combined systolic (congestive) and diastolic (congestive) heart failure: Secondary | ICD-10-CM

## 2017-10-09 MED ORDER — FUROSEMIDE 40 MG PO TABS: 40.0000 mg | ORAL_TABLET | Freq: Two times a day (BID) | ORAL | 3 refills | Status: DC

## 2017-10-09 NOTE — Telephone Encounter (Signed)
New Message:       Pt c/o Shortness Of Breath: STAT if SOB developed within the last 24 hours or pt is noticeably SOB on the phone  1. Are you currently SOB (can you hear that pt is SOB on the phone)? Pt's daughter  2. How long have you been experiencing SOB? 3/4 days  3. Are you SOB when sitting or when up moving around? Moving around  4. Are you currently experiencing any other symptoms? Unable to urinate as usual.

## 2017-10-09 NOTE — Telephone Encounter (Signed)
Spoke with pt dtr, Aware of dr crenshaw's recommendations. New script sent to the pharmacy and Lab orders mailed to the pt  

## 2017-10-09 NOTE — Telephone Encounter (Signed)
Change lasix to 40 mg BID; bmet one week Kirk Ruths

## 2017-10-09 NOTE — Telephone Encounter (Signed)
Spoke with patient's daughter. Patient c/o SOB for 3-4 days that is worse than her baseline and weight gain. She was 128 lbs on 5/11 and is 131.8lbs today. She has been taking lasix as prescribed - 40mg  QD + 20mg  PRN but yesterday she took 40mg  BID. She has having minimal UOP. She denies swelling. She follows a low sodium diet.  She does not routinely check BP or HR at home.   Last labs in Feb showed worsening renal function. Last echo 2 years ago - EF 35-40%  Will route to MD/RN for recommendations

## 2017-10-09 NOTE — Telephone Encounter (Signed)
Pt c/o swelling: STAT is pt has developed SOB within 24 hours  1) How much weight have you gained and in what time span? About 3lbs in 3 or 4 days  2) If swelling, where is the swelling located no swelling 3) Are you currently taking a fluid pill? Furosemide 4) Are you currently SOB? yes  5) Do you have a log of your daily weights (if so, list)? no  6) Have you gained 3 pounds in a day or 5 pounds in a week? yes  7) Have you traveled recently? no

## 2017-10-18 LAB — BASIC METABOLIC PANEL
BUN / CREAT RATIO: 22 (ref 12–28)
BUN: 48 mg/dL — AB (ref 8–27)
CO2: 22 mmol/L (ref 20–29)
CREATININE: 2.22 mg/dL — AB (ref 0.57–1.00)
Calcium: 9.1 mg/dL (ref 8.7–10.3)
Chloride: 102 mmol/L (ref 96–106)
GFR, EST AFRICAN AMERICAN: 22 mL/min/{1.73_m2} — AB (ref >59)
GFR, EST NON AFRICAN AMERICAN: 19 mL/min/{1.73_m2} — AB (ref >59)
GLUCOSE: 115 mg/dL — AB (ref 65–99)
Potassium: 5.2 mmol/L (ref 3.5–5.2)
SODIUM: 136 mmol/L (ref 134–144)

## 2017-11-23 ENCOUNTER — Encounter: Payer: Self-pay | Admitting: Podiatry

## 2017-11-23 ENCOUNTER — Ambulatory Visit (INDEPENDENT_AMBULATORY_CARE_PROVIDER_SITE_OTHER): Payer: Medicare Other | Admitting: Podiatry

## 2017-11-23 DIAGNOSIS — E1140 Type 2 diabetes mellitus with diabetic neuropathy, unspecified: Secondary | ICD-10-CM | POA: Diagnosis not present

## 2017-11-23 DIAGNOSIS — B351 Tinea unguium: Secondary | ICD-10-CM

## 2017-11-23 DIAGNOSIS — L84 Corns and callosities: Secondary | ICD-10-CM

## 2017-11-23 DIAGNOSIS — M79675 Pain in left toe(s): Secondary | ICD-10-CM | POA: Diagnosis not present

## 2017-11-23 DIAGNOSIS — M79674 Pain in right toe(s): Secondary | ICD-10-CM | POA: Diagnosis not present

## 2017-11-23 DIAGNOSIS — D689 Coagulation defect, unspecified: Secondary | ICD-10-CM | POA: Diagnosis not present

## 2017-11-23 NOTE — Progress Notes (Signed)
Complaint:  Visit Type: Patient returns to my office for continued preventative foot care services. Complaint: Patient states" my nails have grown long and thick and become painful to walk and wear shoes" Patient has been diagnosed with DM . The patient presents for preventative foot care services. No changes to ROS.  Patient is taking eliquiss.  Podiatric Exam: Vascular: dorsalis pedis and posterior tibial pulses are palpable bilateral. Capillary return is immediate. Temperature gradient is WNL. Skin turgor WNL  Sensorium: Normal Semmes Weinstein monofilament test. Normal tactile sensation bilaterally. Nail Exam: Pt has thick disfigured discolored nails with subungual debris noted bilateral entire nail hallux through fifth toenails Ulcer Exam: There is no evidence of ulcer or pre-ulcerative changes or infection. Orthopedic Exam: Muscle tone and strength are WNL. No limitations in general ROM. No crepitus or effusions noted. HAV  B/L.   Skin: No Porokeratosis. No infection or ulcers.  Corn fifth left and third right.  Callus sub 1 right. Diagnosis:  Onychomycosis, , Pain in right toe, pain in left toes,  Debride callus.  Treatment & Plan Procedures and Treatment: Consent by patient was obtained for treatment procedures.   Debridement of mycotic and hypertrophic toenails, 1 through 5 bilateral and clearing of subungual debris. No ulceration, no infection noted.  Return Visit-Office Procedure: Patient instructed to return to the office for a follow up visit 3 months for continued evaluation and treatment.    Gardiner Barefoot DPM

## 2017-11-30 ENCOUNTER — Telehealth: Payer: Self-pay | Admitting: Cardiology

## 2017-11-30 NOTE — Telephone Encounter (Signed)
Spoke with pt, Aware of dr Jacalyn Lefevre recommendations. She will call if she has symptoms or problems prior to follow up.

## 2017-11-30 NOTE — Telephone Encounter (Signed)
TRIED TO CALL-CELL # HAS A FOREIGN FEMALE ANSWERING AND HOME # DISCONNECTED. WILL HAVE TO AWAIT CALL BACK

## 2017-11-30 NOTE — Telephone Encounter (Signed)
New message    Daughter calling stating PCP recently drawn labs that shown worsening heart failure was told to call the office an make appt with Dr. Stanford Breed.    Daughter aware of appt with Dr. Stanford Breed on  8/12  Patient daughter states - no APP.

## 2017-11-30 NOTE — Telephone Encounter (Signed)
Spoke with pt dtr, she reports the medical doctor did blood work and it showed her heart failure was worse. Labs from dr Coletta Memos shows BNP=2,400.6. Spoke with pt, she reports no SOB or orthopnea. Her oxygen sat is 95-97% and her bp is normal. She has no swelling, her weight is up to 131 lb from 130 lb yesterday. She is currently taking furosemide 40 mg once daily. She has a follow up appointment 01-07-18. Will forward for dr Stanford Breed review

## 2017-11-30 NOTE — Telephone Encounter (Signed)
If no dyspnea or edema and weight stable, would not change lasix dose [name]

## 2017-11-30 NOTE — Telephone Encounter (Signed)
Received records from [facility] on 11/30/17, Appt 01/07/18 @ 9:20AM. [place]

## 2017-11-30 NOTE — Telephone Encounter (Signed)
S/w daughter Sharyn Lull she states that [name] is taking care of this and she will await her call back

## 2017-11-30 NOTE — Telephone Encounter (Signed)
Pt's dtr Toya Smothers called back to state she hasn't gotten a call back, I told her we tired but the number was wrong, I confirmed her number is [phone] and she states her phone has not rung all day. Pls call

## 2017-12-21 ENCOUNTER — Telehealth: Payer: Self-pay | Admitting: Cardiology

## 2017-12-21 ENCOUNTER — Telehealth: Payer: Self-pay | Admitting: Internal Medicine

## 2017-12-21 NOTE — Telephone Encounter (Signed)
F/U      Patient's daughter calling to check status on previous note.

## 2017-12-21 NOTE — Telephone Encounter (Signed)
Called patient, she states that she is now back down to her normal weight of 132 since yesterday. Patient has slight swelling noted in ankles and legs, pt denies SOB, and chest pains. Did mention having chest tightness a couple of days ago but nothing recent. She did mention she was started on Gabapentin a few weeks ago from PCP to help with her leg pain, I advised patient to call her PCP regarding that medication because it can be a side effect. Patient agreed and would call to see if she needed to stop the medication.

## 2017-12-21 NOTE — Telephone Encounter (Signed)
[id number] 

## 2017-12-21 NOTE — Telephone Encounter (Signed)
New Message    Pt c/o swelling: STAT is pt has developed SOB within 24 hours  1) How much weight have you gained and in what time span? 1lbs  2) If swelling, where is the swelling located?  In ankles   3) Are you currently taking a fluid pill? yes  4) Are you currently SOB? no  5) Do you have a log of your daily weights (if so, list)? Unsure, daughter is calling on behalf of mother   77) Have you gained 3 pounds in a day or 5 pounds in a week? No   7) Have you traveled recently? No    The daughter says to call the mother.

## 2017-12-23 ENCOUNTER — Emergency Department (HOSPITAL_COMMUNITY): Payer: Medicare Other

## 2017-12-23 ENCOUNTER — Other Ambulatory Visit: Payer: Self-pay

## 2017-12-23 ENCOUNTER — Encounter (HOSPITAL_COMMUNITY): Payer: Self-pay | Admitting: Emergency Medicine

## 2017-12-23 ENCOUNTER — Observation Stay (HOSPITAL_COMMUNITY)
Admission: EM | Admit: 2017-12-23 | Discharge: 2017-12-24 | Disposition: A | Discharge status: Home / Self Care | Payer: Medicare Other | Attending: Internal Medicine | Admitting: Internal Medicine

## 2017-12-23 DIAGNOSIS — I340 Nonrheumatic mitral (valve) insufficiency: Secondary | ICD-10-CM | POA: Insufficient documentation

## 2017-12-23 DIAGNOSIS — I132 Hypertensive heart and chronic kidney disease with heart failure and with stage 5 chronic kidney disease, or end stage renal disease: Secondary | ICD-10-CM | POA: Insufficient documentation

## 2017-12-23 DIAGNOSIS — I420 Dilated cardiomyopathy: Secondary | ICD-10-CM | POA: Diagnosis not present

## 2017-12-23 DIAGNOSIS — Z885 Allergy status to narcotic agent status: Secondary | ICD-10-CM | POA: Insufficient documentation

## 2017-12-23 DIAGNOSIS — Z79899 Other long term (current) drug therapy: Secondary | ICD-10-CM | POA: Insufficient documentation

## 2017-12-23 DIAGNOSIS — J9621 Acute and chronic respiratory failure with hypoxia: Secondary | ICD-10-CM | POA: Diagnosis not present

## 2017-12-23 DIAGNOSIS — Z859 Personal history of malignant neoplasm, unspecified: Secondary | ICD-10-CM | POA: Diagnosis not present

## 2017-12-23 DIAGNOSIS — J9620 Acute and chronic respiratory failure, unspecified whether with hypoxia or hypercapnia: Secondary | ICD-10-CM

## 2017-12-23 DIAGNOSIS — N184 Chronic kidney disease, stage 4 (severe): Secondary | ICD-10-CM | POA: Insufficient documentation

## 2017-12-23 DIAGNOSIS — I5023 Acute on chronic systolic (congestive) heart failure: Secondary | ICD-10-CM | POA: Diagnosis not present

## 2017-12-23 DIAGNOSIS — N185 Chronic kidney disease, stage 5: Secondary | ICD-10-CM

## 2017-12-23 DIAGNOSIS — R0789 Other chest pain: Principal | ICD-10-CM | POA: Insufficient documentation

## 2017-12-23 DIAGNOSIS — E1122 Type 2 diabetes mellitus with diabetic chronic kidney disease: Secondary | ICD-10-CM | POA: Diagnosis not present

## 2017-12-23 DIAGNOSIS — Z7901 Long term (current) use of anticoagulants: Secondary | ICD-10-CM | POA: Diagnosis not present

## 2017-12-23 DIAGNOSIS — E119 Type 2 diabetes mellitus without complications: Secondary | ICD-10-CM

## 2017-12-23 DIAGNOSIS — I5022 Chronic systolic (congestive) heart failure: Secondary | ICD-10-CM | POA: Diagnosis present

## 2017-12-23 DIAGNOSIS — Z96653 Presence of artificial knee joint, bilateral: Secondary | ICD-10-CM | POA: Diagnosis not present

## 2017-12-23 DIAGNOSIS — Z9889 Other specified postprocedural states: Secondary | ICD-10-CM | POA: Insufficient documentation

## 2017-12-23 DIAGNOSIS — Z66 Do not resuscitate: Secondary | ICD-10-CM | POA: Diagnosis not present

## 2017-12-23 DIAGNOSIS — E785 Hyperlipidemia, unspecified: Secondary | ICD-10-CM | POA: Diagnosis not present

## 2017-12-23 DIAGNOSIS — Z853 Personal history of malignant neoplasm of breast: Secondary | ICD-10-CM | POA: Insufficient documentation

## 2017-12-23 DIAGNOSIS — I480 Paroxysmal atrial fibrillation: Secondary | ICD-10-CM | POA: Diagnosis not present

## 2017-12-23 DIAGNOSIS — I5042 Chronic combined systolic (congestive) and diastolic (congestive) heart failure: Secondary | ICD-10-CM

## 2017-12-23 DIAGNOSIS — I509 Heart failure, unspecified: Secondary | ICD-10-CM

## 2017-12-23 DIAGNOSIS — Z9981 Dependence on supplemental oxygen: Secondary | ICD-10-CM | POA: Diagnosis not present

## 2017-12-23 DIAGNOSIS — I4821 Permanent atrial fibrillation: Secondary | ICD-10-CM | POA: Diagnosis present

## 2017-12-23 HISTORY — DX: Chronic kidney disease, stage 5: N18.5

## 2017-12-23 HISTORY — DX: Acute and chronic respiratory failure, unspecified whether with hypoxia or hypercapnia: J96.20

## 2017-12-23 LAB — GLUCOSE, CAPILLARY
Glucose-Capillary: 131 mg/dL — ABNORMAL HIGH (ref 70–99)
Glucose-Capillary: 132 mg/dL — ABNORMAL HIGH (ref 70–99)
Glucose-Capillary: 113 mg/dL — ABNORMAL HIGH (ref 70–99)

## 2017-12-23 LAB — BASIC METABOLIC PANEL
Anion gap: 11 (ref 5–15)
BUN: 47 mg/dL — ABNORMAL HIGH (ref 8–23)
CO2: 22 mmol/L (ref 22–32)
CREATININE: 2.22 mg/dL — AB (ref 0.44–1.00)
Calcium: 9.7 mg/dL (ref 8.9–10.3)
Chloride: 106 mmol/L (ref 98–111)
GFR calc Af Amer: 21 mL/min — ABNORMAL LOW (ref >60)
GFR calc non Af Amer: 18 mL/min — ABNORMAL LOW (ref >60)
Glucose, Bld: 152 mg/dL — ABNORMAL HIGH (ref 70–99)
Potassium: 4.3 mmol/L (ref 3.5–5.1)
SODIUM: 139 mmol/L (ref 135–145)

## 2017-12-23 LAB — CBC
HCT: 37.3 % (ref 36.0–46.0)
HEMOGLOBIN: 12.3 g/dL (ref 12.0–15.0)
MCH: 29.1 pg (ref 26.0–34.0)
MCHC: 33 g/dL (ref 30.0–36.0)
MCV: 88.4 fL (ref 78.0–100.0)
Platelets: 201 10*3/uL (ref 150–400)
RBC: 4.22 MIL/uL (ref 3.87–5.11)
RDW: 14.6 % (ref 11.5–15.5)
WBC: 9 10*3/uL (ref 4.0–10.5)

## 2017-12-23 LAB — I-STAT TROPONIN, ED: Troponin i, poc: 0 ng/mL (ref 0.00–0.08)

## 2017-12-23 MED ORDER — INSULIN ASPART 100 UNIT/ML ~~LOC~~ SOLN
0.0000 [IU] | Freq: Three times a day (TID) | SUBCUTANEOUS | Status: DC
  Administered 2017-12-23 – 2017-12-24 (×3): 1 [IU] via SUBCUTANEOUS

## 2017-12-23 MED ORDER — ONDANSETRON HCL 4 MG/2ML IJ SOLN
4.0000 mg | Freq: Four times a day (QID) | INTRAMUSCULAR | Status: DC | PRN
  Administered 2017-12-23: 4 mg via INTRAVENOUS
  Filled 2017-12-23: qty 2

## 2017-12-23 MED ORDER — FUROSEMIDE 10 MG/ML IJ SOLN
60.0000 mg | Freq: Two times a day (BID) | INTRAMUSCULAR | Status: DC
  Administered 2017-12-23 – 2017-12-24 (×3): 60 mg via INTRAVENOUS
  Filled 2017-12-23 (×3): qty 6

## 2017-12-23 MED ORDER — ACETAMINOPHEN 325 MG PO TABS
650.0000 mg | ORAL_TABLET | Freq: Four times a day (QID) | ORAL | Status: DC | PRN
  Administered 2017-12-23: 650 mg via ORAL
  Filled 2017-12-23: qty 2

## 2017-12-23 MED ORDER — FUROSEMIDE 10 MG/ML IJ SOLN: 40.0000 mg | Freq: Once | INTRAMUSCULAR | Status: DC

## 2017-12-23 MED ORDER — ACETAMINOPHEN 650 MG RE SUPP: 650.0000 mg | Freq: Four times a day (QID) | RECTAL | Status: DC | PRN

## 2017-12-23 MED ORDER — SENNOSIDES-DOCUSATE SODIUM 8.6-50 MG PO TABS: 1.0000 | ORAL_TABLET | Freq: Every evening | ORAL | Status: DC | PRN

## 2017-12-23 MED ORDER — BISACODYL 10 MG RE SUPP: 10.0000 mg | Freq: Every day | RECTAL | Status: DC | PRN

## 2017-12-23 MED ORDER — FUROSEMIDE 10 MG/ML IJ SOLN: 60.0000 mg | Freq: Two times a day (BID) | INTRAMUSCULAR | Status: DC

## 2017-12-23 MED ORDER — ONDANSETRON HCL 4 MG PO TABS: 4.0000 mg | ORAL_TABLET | Freq: Four times a day (QID) | ORAL | Status: DC | PRN

## 2017-12-23 NOTE — ED Triage Notes (Signed)
Pt has hx of COPD, CHF, and afib.  Not slept all night tonight. Chest pressure and tightness. Wears 3L [place] at night.  No nausea, vomiting, or diarrhea.

## 2017-12-23 NOTE — ED Provider Notes (Signed)
[facility] EMERGENCY DEPARTMENT Provider Note   CSN: [id number] Arrival date & time: 12/23/17  0645  History   Chief Complaint Chief Complaint  Patient presents with  . Chest Pain    HPI [name] is a 84 y.o. female with a medical history of DM, systolic heart failure, HTN, afib on eliquis, CKD V, and breast cancer who presents to the ED with chest tightness that began this morning after waking. She states that the 5/10 chest pressure was located broadly across her mid-chest and was non-radiating. The pain was associated with mild dyspnea, but no nausea, vomiting, or diaphoresis. She took her cardizem medication and came to the ED.  By the time she got here, the pain was resolved. Patient reports that she was started on gabapentin in early July for lower extremity neuropathic pain. Over the past few weeks she has developed worsened swelling in her lower extremities and weight gain. She stopped the gabapentin as recommended by her PCP 2 days ago. Since then, her swelling has decreased. She reports her weight today is 131 (she's usually 129). She normally takes 40 mg lasix per day and takes extra when her weight is up. She has not taken her lasix yet today.   Past Medical History:  Diagnosis Date  . Cancer [facility])    breast cancer  . Cardiomyopathy ([place])    a. 04/2015 Echo: EF 55-60%;  b. 07/2015 Echo: EF 35-40%, mod LVH, antsep DK, mod AI, sev MR, sev dil LA, mildly dil RA, mild-mod TR, PASP 62mmHg-->conservatively managed.  . Chronic systolic CHF (congestive heart failure) ([place])    a. 07/2015 Echo: EF 35-40%.  . CKD (chronic kidney disease), stage IV ([place])   . Diabetes mellitus without complication ([place])   . Essential hypertension   . HLD (hyperlipidemia)   . Permanent atrial fibrillation ([place])    a. diagnosed 04/2015 --> conservative mgmt with rate control and xarelto.  . Pulsatile neck mass    a. 11/2015 Neck U/S: pulsatile R neck mass correlates w/ a toruous and  mildy ectatic segment of the R SCA measuring ~ 2.1 cm in max diameter - no change since noted on 12/16 CT.  [name] Severe mitral regurgitation by prior echocardiogram    a. 07/2015 sev MR    Patient Active Problem List   Diagnosis Date Noted  . Acute and chronic respiratory failure (acute-on-chronic) ([place]) 12/23/2017  . CKD (chronic kidney disease), stage V ([place]) 12/23/2017  . Essential hypertension   . HLD (hyperlipidemia)   . PAF (paroxysmal atrial fibrillation) ([place])   . Cardiomyopathy ([place])   . Mitral regurgitation 08/24/2015  . Congestive dilated cardiomyopathy ([place]) 08/24/2015  . Congestive heart failure (CHF) ([place]) 08/24/2015  . Acute on chronic congestive heart failure ([place])   . CHF exacerbation ([place]) 08/02/2015  . Elevated lactic acid level 08/02/2015  . Increased lactic acid level 08/02/2015  . Dyspnea 08/02/2015  . AKI (acute kidney injury) ([place]) 05/10/2015  .  Rib fractures - right #8-10 05/09/2015  . Atrial fibrillation with rapid ventricular response ([place]) 05/09/2015  . Scapula fracture 05/09/2015  . Fall 05/09/2015  . Essential hypertension, benign 05/09/2015  . Diabetes mellitus without complication ([place]) 44/81/8563  . History of breast cancer 05/09/2015    Past Surgical History:  Procedure Laterality Date  . ABDOMINAL HYSTERECTOMY    . BREAST LUMPECTOMY    . FRACTURE SURGERY     L femur  . JOINT REPLACEMENT     knees  OB History   None      Home Medications    Prior to Admission medications   Medication Sig Start Date End Date Taking? Authorizing Provider  acetaminophen (TYLENOL) 500 MG tablet Take 500 mg by mouth every 6 (six) hours as needed.    [provider]  apixaban (ELIQUIS) 2.5 MG TABS tablet Take 1 tablet (2.5 mg total) by mouth 2 (two) times daily. 08/31/17   Lelon Perla, MD  ARTIFICIAL TEARS 0.1-0.3 % SOLN Place 1 drop into both eyes 2 (two) times daily.     [provider]  carvedilol (COREG) 25 MG tablet Take 1  tablet (25 mg total) by mouth at bedtime. 09/24/17   Lelon Perla, MD  Cholecalciferol (VITAMIN D) 2000 units tablet Take 2,000 Units by mouth daily.    [provider]  diltiazem (CARDIZEM CD) 120 MG 24 hr capsule Take 1 capsule (120 mg total) by mouth daily. 08/30/17   Lelon Perla, MD  diphenhydramine-acetaminophen (TYLENOL PM) 25-500 MG TABS tablet Take 1 tablet by mouth at bedtime as needed.    [provider]  ferrous sulfate 325 (65 FE) MG tablet Take 325 mg by mouth 2 (two) times daily with a meal.     [provider]  furosemide (LASIX) 40 MG tablet Take 1 tablet (40 mg total) by mouth 2 (two) times daily. 10/09/17   Lelon Perla, MD  hydrALAZINE (APRESOLINE) 10 MG tablet Take 1 tablet (10 mg total) by mouth 3 (three) times daily. 09/24/17   Lelon Perla, MD  isosorbide mononitrate (IMDUR) 30 MG 24 hr tablet Take 1 tablet (30 mg total) by mouth daily. 07/06/17 10/04/17  Lelon Perla, MD  LUTEIN PO Take 25 mg by mouth daily.     [provider]  Omega-3 Fatty Acids (FISH OIL PO) Take 1 capsule by mouth daily.    [provider]  OXYGEN Inhale 3 L into the lungs See admin instructions. Use every night at bedtime and during the day if needed for shortness of breath    [provider]  Resveratrol 250 MG CAPS Take 250 mg by mouth daily.    [provider]  saxagliptin HCl (ONGLYZA) 2.5 MG TABS tablet Take 2.5 mg by mouth as directed. 06/05/16   [provider]    Family History Family History  Problem Relation Age of Onset  . Coronary artery disease Father   . Stroke Mother     Social History Social History   Tobacco Use  . Smoking status: Never Smoker  . Smokeless tobacco: Never Used  Substance Use Topics  . Alcohol use: No    Alcohol/week: 0.0 oz  . Drug use: No     Allergies   Codeine and Oxycodone   Review of Systems Review of Systems  Constitutional: Negative for chills and fever.    HENT: Negative for rhinorrhea and sore throat.   Eyes: Negative for visual disturbance.  Respiratory: Positive for chest tightness and shortness of breath.   Cardiovascular: Positive for chest pain and leg swelling.  Gastrointestinal: Negative for abdominal pain, nausea and vomiting.  Genitourinary: Negative for difficulty urinating and dysuria.  Musculoskeletal: Negative for back pain and neck pain.  Skin: Negative for rash and wound.  Neurological: Negative for dizziness and headaches.     Physical Exam Updated Vital Signs BP (!) 150/85   Pulse 95   Temp 97.6 F (36.4 C) (Oral)   Resp (!) 23  SpO2 93%   Physical Exam  Constitutional: She is oriented to person, place, and time. She appears well-developed and well-nourished. She does not appear ill.  HENT:  Head: Normocephalic and atraumatic.  Neck: Normal range of motion. Neck supple. JVD present.  Cardiovascular: Normal rate and intact distal pulses. An irregularly irregular rhythm present. Exam reveals no gallop and no friction rub.  No murmur heard. Pulmonary/Chest:  Patient is mildly tachypneic with minimal accessory muscle usage. Crackles present in bilateral lung bases. No wheezing.  Abdominal: Soft.  Reducible large ventral hernia.  Musculoskeletal:  Trace BLE edema  Neurological: She is alert and oriented to person, place, and time.  Skin: Skin is warm and dry. Capillary refill takes less than 2 seconds. No rash noted.  Psychiatric: She has a normal mood and affect. Her behavior is normal.     ED Treatments / Results  Labs (all labs ordered are listed, but only abnormal results are displayed) Labs Reviewed  BASIC METABOLIC PANEL - Abnormal; Notable for the following components:      Result Value   Glucose, Bld 152 (*)    BUN 47 (*)    Creatinine, Ser 2.22 (*)    GFR calc non Af Amer 18 (*)    GFR calc Af Amer 21 (*)    All other components within normal limits  CBC  I-STAT TROPONIN, ED    EKG EKG  Interpretation  Date/Time:  Sunday December 23 2017 06:55:52 EDT Ventricular Rate:  98 PR Interval:    QRS Duration: 144 QT Interval:  384 QTC Calculation: 490 R Axis:   72 Text Interpretation:  Atrial fibrillation Left bundle branch block Abnormal ECG Confirmed by Pieper Morrison [phone]) on 12/23/2017 7:06:46 AM Also confirmed by Tniyah Morrison [phone]), editor Philomena Doheny [phone])  on 12/23/2017 8:58:47 AM   Radiology Dg Chest 2 View  Result Date: 12/23/2017 CLINICAL DATA:  Chest tightness for several hours EXAM: CHEST - 2 VIEW COMPARISON:  12/26/2015 FINDINGS: Cardiac shadow is enlarged. Aortic calcifications are again seen. Diffuse vascular congestion and pulmonary edema is noted consistent with CHF. Old rib fractures are noted. Degenerative changes of the shoulder joints are seen. Chronic compression deformities are noted. IMPRESSION: Changes consistent with significant CHF. Electronically Signed   By: Inez Catalina M.D.   On: 12/23/2017 07:27    Procedures Procedures (including critical care time)  Medications Ordered in ED Medications  furosemide (LASIX) injection 60 mg (has no administration in time range)     Initial Impression / Assessment and Plan / ED Course  I have reviewed the triage vital signs and the nursing notes.  Pertinent labs & imaging results that were available during my care of the patient were reviewed by me and considered in my medical decision making (see chart for details).  Emberli Ballester is an 84 y.o. female with a medical history of DM, systolic heart failure, HTN, afib on eliquis, CKD V, and breast cancer who presents to the ED with chest tightness and dyspnea. She usually only wears 3L O2 at home at night, but is requiring 3L here in the ED. Upon arrival to the ED, she is afebrile and hemodynamically stable with mild tachypnea and satting 95% on 3L [place]. Physical exam shows JVD, crackles in bibasilar lung fields, and trace lower extremity edema. Labs show  negative troponin. EKG shows afib and LBBB. CXR shows diffuse vascular congestion and pulmonary edema consistent with CHF. Pt given 40mg  IV lasix for suspected CHF exacerbation. Patient warrants  admission for IV diuresis and weaning of oxygen. Patient accepted by Dr. Evangeline Gula.  Final Clinical Impressions(s) / ED Diagnoses   Final diagnoses:  Acute on chronic congestive heart failure, unspecified heart failure type [facility])    ED Discharge Orders    None       Yovanny Coats, Andree Elk, MD 12/23/17 1557    Zeta Morrison, MD 12/23/17 1617    Hajer Morrison, MD 01/05/18 2259

## 2017-12-23 NOTE — H&P (Signed)
History and Physical    [name] CBJ:[id number] DOB: April 08, 1926 DOA: 12/23/2017   PCP: Bernerd Limbo, MD   Patient coming from:  Home    Chief Complaint: chest tightness and shortness of breath   HPI: [name] is a 84 y.o. female with medical history significant for diabetes, systolic heart failure, cardiomyopathy, with EF 35 to 40% per March 2017, severe mitral regurgitation atrial fibrillation on Eliquis, CKD stage V, history of breast cancer, hypertension, hyperlipidemia, oxygen dependence at 3 L at night, brought to the emergency department after waking up with which she describes as chest tightness, no discrete chest pain.  She just arrived chest pressure is 4/10, located across the mid chest, nonradiating.  The pain was associated with mild dyspnea, without nausea, vomiting or diaphoresis.  She denies any dizziness, vertigo, presyncope or syncope.  Patient took Cardizem before coming to the ED.  At the time that she arrived, the pain had resolved.  She reports starting Neurontin on early July for lower extremity neuropathic pain, but she discontinued, due to worsening swelling in the lower extremities and weight gain.  Her weight is now normalized at 129 pounds and the edema improved.  The patient takes normally 40 mg of Lasix a day, and she takes extra when her weight is up, but she has not taking her diuretic prior to admission.  She denies any fever, chills, night sweats, recent infections, or hospitalizations.  She is compliant with her medications.  She denies any headaches or vision changes, no confusion is reported.  She denies any unilateral weakness. She has neuropathy but no abnormal numbness  No tobacco, alcohol or recreational drug use.    ED Course:  BP (!) 150/85   Pulse 95   Temp 97.6 F (36.4 C) (Oral)   Resp (!) 23   SpO2 93%   Presentation, the patient was very tachypneic, and she also showed JVD, consistent with fluid overload. Her chest x-ray changes  consistent with significant CHF in the setting of diffuse vascular congestion and pulmonary edema. Patient is atrial fibrillation with left bundle branch block, without significant changes when compared to prior. Troponin is negative. Patient received Lasix 60 mg at the ED, and is beginning to diurese. Glucose 152, creatinine 2.22, GFR 18  Review of Systems:  As per HPI otherwise all other systems reviewed and are negative  Past Medical History:  Diagnosis Date  . Cancer [facility])    breast cancer  . Cardiomyopathy ([place])    a. 04/2015 Echo: EF 55-60%;  b. 07/2015 Echo: EF 35-40%, mod LVH, antsep DK, mod AI, sev MR, sev dil LA, mildly dil RA, mild-mod TR, PASP 34mmHg-->conservatively managed.  . Chronic systolic CHF (congestive heart failure) ([place])    a. 07/2015 Echo: EF 35-40%.  . CKD (chronic kidney disease), stage IV ([place])   . Diabetes mellitus without complication ([place])   . Essential hypertension   . HLD (hyperlipidemia)   . Permanent atrial fibrillation ([place])    a. diagnosed 04/2015 --> conservative mgmt with rate control and xarelto.  . Pulsatile neck mass    a. 11/2015 Neck U/S: pulsatile R neck mass correlates w/ a toruous and mildy ectatic segment of the R SCA measuring ~ 2.1 cm in max diameter - no change since noted on 12/16 CT.  [name] Severe mitral regurgitation by prior echocardiogram    a. 07/2015 sev MR    Past Surgical History:  Procedure Laterality Date  . ABDOMINAL HYSTERECTOMY    . BREAST  LUMPECTOMY    . FRACTURE SURGERY     L femur  . JOINT REPLACEMENT     knees    Social History Social History   Socioeconomic History  . Marital status: Widowed    Spouse name: Not on file  . Number of children: Not on file  . Years of education: Not on file  . Highest education level: Not on file  Occupational History  . Not on file  Social Needs  . Financial resource strain: Not on file  . Food insecurity:    Worry: Not on file    Inability: Not on file  . Transportation  needs:    Medical: Not on file    Non-medical: Not on file  Tobacco Use  . Smoking status: Never Smoker  . Smokeless tobacco: Never Used  Substance and Sexual Activity  . Alcohol use: No    Alcohol/week: 0.0 oz  . Drug use: No  . Sexual activity: Never  Lifestyle  . Physical activity:    Days per week: Not on file    Minutes per session: Not on file  . Stress: Not on file  Relationships  . Social connections:    Talks on phone: Not on file    Gets together: Not on file    Attends religious service: Not on file    Active member of club or organization: Not on file    Attends meetings of clubs or organizations: Not on file    Relationship status: Not on file  . Intimate partner violence:    Fear of current or ex partner: Not on file    Emotionally abused: Not on file    Physically abused: Not on file    Forced sexual activity: Not on file  Other Topics Concern  . Not on file  Social History Narrative  . Not on file     Allergies  Allergen Reactions  . Codeine Other (See Comments)    "talks out of her head" "talks out of her head" "talks out of her head"  . Oxycodone Other (See Comments)    confusion confusion Other reaction(s): Other (See Comments) confusion  confusion    Family History  Problem Relation Age of Onset  . Coronary artery disease Father   . Stroke Mother        Prior to Admission medications   Medication Sig Start Date End Date Taking? Authorizing Provider  acetaminophen (TYLENOL) 500 MG tablet Take 500 mg by mouth every 6 (six) hours as needed.    [provider]  apixaban (ELIQUIS) 2.5 MG TABS tablet Take 1 tablet (2.5 mg total) by mouth 2 (two) times daily. 08/31/17   Lelon Perla, MD  ARTIFICIAL TEARS 0.1-0.3 % SOLN Place 1 drop into both eyes 2 (two) times daily.     [provider]  carvedilol (COREG) 25 MG tablet Take 1 tablet (25 mg total) by mouth at bedtime. 09/24/17   Lelon Perla, MD  Cholecalciferol  (VITAMIN D) 2000 units tablet Take 2,000 Units by mouth daily.    [provider]  diltiazem (CARDIZEM CD) 120 MG 24 hr capsule Take 1 capsule (120 mg total) by mouth daily. 08/30/17   Lelon Perla, MD  diphenhydramine-acetaminophen (TYLENOL PM) 25-500 MG TABS tablet Take 1 tablet by mouth at bedtime as needed.    [provider]  ferrous sulfate 325 (65 FE) MG tablet Take 325 mg by mouth 2 (two) times daily with a meal.  [provider]  furosemide (LASIX) 40 MG tablet Take 1 tablet (40 mg total) by mouth 2 (two) times daily. 10/09/17   Lelon Perla, MD  hydrALAZINE (APRESOLINE) 10 MG tablet Take 1 tablet (10 mg total) by mouth 3 (three) times daily. 09/24/17   Lelon Perla, MD  isosorbide mononitrate (IMDUR) 30 MG 24 hr tablet Take 1 tablet (30 mg total) by mouth daily. 07/06/17 10/04/17  Lelon Perla, MD  LUTEIN PO Take 25 mg by mouth daily.     [provider]  Omega-3 Fatty Acids (FISH OIL PO) Take 1 capsule by mouth daily.    [provider]  OXYGEN Inhale 3 L into the lungs See admin instructions. Use every night at bedtime and during the day if needed for shortness of breath    [provider]  Resveratrol 250 MG CAPS Take 250 mg by mouth daily.    [provider]  saxagliptin HCl (ONGLYZA) 2.5 MG TABS tablet Take 2.5 mg by mouth as directed. 06/05/16   [provider]     Physical Exam:  Vitals:   12/23/17 0654 12/23/17 0745  BP: (!) 162/83 (!) 150/85  Pulse: 98 95  Resp: 18 (!) 23  Temp: 97.6 F (36.4 C)   TempSrc: Oral   SpO2: 100% 93%   Constitutional: NAD, calm, comfortable on 3 L O2 [place]  Eyes: PERRL, mild periorbital edema and conjunctivae normal ENMT: Mucous membranes are moist, without exudate or lesions  Neck: remarkable for R palpable pulsatile mass, per patient report is not new.  no thyromegaly + JVD  Respiratory:  Decreased breath sounds at the bases, with crackles audible, normal  respiratory effort in 3 L [place] , no accessory muscle use  Cardiovascular: Irregularly irregular rate and rhythm, 2/6 systolic  Murmur, no rubs or gallops. No extremity edema. 2+ pedal pulses. No carotid bruits.  Abdomen: Soft, non tender, No hepatosplenomegaly. Bowel sounds positive. Reducible ventral hernia  Musculoskeletal: no clubbing / cyanosis. Moves all extremities. Pronounced kyphosis  Skin: no jaundice.  Several areas of echymoses in different stages at the lower extremities, no open lesion s Neurologic: Sensation intact  Strength equal in all extremities Psychiatric:   Alert and oriented x 3. Normal mood.     Labs on Admission: I have personally reviewed following labs and imaging studies  CBC: Recent Labs  Lab 12/23/17 0659  WBC 9.0  HGB 12.3  HCT 37.3  MCV 88.4  PLT 426    Basic Metabolic Panel: Recent Labs  Lab 12/23/17 0659  NA 139  K 4.3  CL 106  CO2 22  GLUCOSE 152*  BUN 47*  CREATININE 2.22*  CALCIUM 9.7    GFR: CrCl cannot be calculated (Unknown ideal weight.).  Liver Function Tests: No results for input(s): AST, ALT, ALKPHOS, BILITOT, PROT, ALBUMIN in the last 168 hours. No results for input(s): LIPASE, AMYLASE in the last 168 hours. No results for input(s): AMMONIA in the last 168 hours.  Coagulation Profile: No results for input(s): INR, PROTIME in the last 168 hours.  Cardiac Enzymes: No results for input(s): CKTOTAL, CKMB, CKMBINDEX, TROPONINI in the last 168 hours.  BNP (last 3 results) No results for input(s): PROBNP in the last 8760 hours.  HbA1C: No results for input(s): HGBA1C in the last 72 hours.  CBG: No results for input(s): GLUCAP in the last 168 hours.  Lipid Profile: No results for input(s): CHOL, HDL, LDLCALC, TRIG, CHOLHDL, LDLDIRECT in the last 72 hours.  Thyroid Function Tests: No results for input(s): TSH, T4TOTAL, FREET4, T3FREE, THYROIDAB in the last 72 hours.  Anemia Panel: No results for input(s): VITAMINB12,  FOLATE, FERRITIN, TIBC, IRON, RETICCTPCT in the last 72 hours.  Urine analysis:    Component Value Date/Time   COLORURINE YELLOW 12/26/2015 1916   APPEARANCEUR CLEAR 12/26/2015 1916   LABSPEC 1.017 12/26/2015 1916   PHURINE 6.0 12/26/2015 1916   GLUCOSEU NEGATIVE 12/26/2015 1916   HGBUR NEGATIVE 12/26/2015 [place] NEGATIVE 12/26/2015 [place] 12/26/2015 1916   PROTEINUR 100 (A) 12/26/2015 1916   NITRITE NEGATIVE 12/26/2015 1916   LEUKOCYTESUR NEGATIVE 12/26/2015 1916    Sepsis Labs: @LABRCNTIP (procalcitonin:4,lacticidven:4) )No results found for this or any previous visit (from the past 240 hour(s)).   Radiological Exams on Admission: Dg Chest 2 View  Result Date: 12/23/2017 CLINICAL DATA:  Chest tightness for several hours EXAM: CHEST - 2 VIEW COMPARISON:  12/26/2015 FINDINGS: Cardiac shadow is enlarged. Aortic calcifications are again seen. Diffuse vascular congestion and pulmonary edema is noted consistent with CHF. Old rib fractures are noted. Degenerative changes of the shoulder joints are seen. Chronic compression deformities are noted. IMPRESSION: Changes consistent with significant CHF. Electronically Signed   By: Inez Catalina M.D.   On: 12/23/2017 07:27    EKG: Independently reviewed.  Assessment/Plan Principal Problem:   Acute on chronic congestive heart failure (HCC) Active Problems:   Diabetes mellitus without complication (HCC)   Congestive dilated cardiomyopathy (HCC)   PAF (paroxysmal atrial fibrillation) (HCC)   Acute and chronic respiratory failure (acute-on-chronic) (HCC)   CKD (chronic kidney disease), stage V ([place]) .     Acute  on chronic respiratory failure with hypoxia  In the setting of fluid overload due to CHF exacerbation/ ICM    Presentation, the patient was very tachypneic, and she also showed JVD, consistent with fluid overload. Her chest x-ray changes consistent with significant CHF with  diffuse vascular congestion  and pulmonary edema.Troponin is negative.atrial fibrillation with left bundle branch block, without significant changes when compared to prior. Patient received Lasix 60 mg at the ED, and is beginning to diurese. LAst 2 D echo in 01/3234 showing Systolic   function was moderately reduced. The estimated ejection fraction   was in the range of 35% to 40%. Dyskinesis of the anteroseptal   myocardium. Weight at BL 129   PLace on Telemetry obs  IV Lasix  60 mg bid  Hold Coreg, continue with her other cardiac meds including Imdur, Lasix, Norvasc monitor I/Os and daily weights prn 02  Chest tightness likely due to above, with neg Tn and unremarkable EKG. Continue to monitor, patient chest pain free at the time   Atrial Fibrillation on anticoagulation with Eliquis  Continue Eliquis and Norvasc   Chronic kidney disease stage 5  Current Cr is 2.22 at baseline  Lab Results  Component Value Date   CREATININE 2.22 (H) 12/23/2017   CREATININE 2.22 (H) 10/18/2017   CREATININE 2.23 (H) 07/06/2017   Repeat BMET in am in view of need of diuresis  Hold NSAIDS   Hypertension BP  159/83   Pulse 113   Had not taken her morning meds except Norvasc Resume  home anti-hypertensive medications      Type II Diabetes Current blood sugar level is 152 Lab Results  Component Value Date   HGBA1C 6.2 (H) 08/02/2015  Hold home oral diabetic medications.  SSI  Pulsatile right-sided neck mass on exam, known to patient, with  last studies in 2016 correlates with a tortuous and mildly ectatic segment of the right subclavian artery measuring approximately 2.1 cm in maximal diameter No intervention indicated at this time Can follw as OP   DVT prophylaxis:  Eliquis  Code Status:  DNR    Family Communication:  Discussed with patient Disposition Plan: Expect patient to be discharged to home after condition improves Consults called:    None  Admission status: Tele Obs   Sharene Butters, PA-C [facility]    Amion text  [id number]   12/23/2017, 10:10 AM

## 2017-12-24 DIAGNOSIS — E119 Type 2 diabetes mellitus without complications: Secondary | ICD-10-CM | POA: Diagnosis not present

## 2017-12-24 DIAGNOSIS — I5023 Acute on chronic systolic (congestive) heart failure: Secondary | ICD-10-CM | POA: Diagnosis not present

## 2017-12-24 DIAGNOSIS — R0789 Other chest pain: Secondary | ICD-10-CM | POA: Diagnosis not present

## 2017-12-24 DIAGNOSIS — N185 Chronic kidney disease, stage 5: Secondary | ICD-10-CM

## 2017-12-24 LAB — BASIC METABOLIC PANEL
Anion gap: 13 (ref 5–15)
BUN: 47 mg/dL — ABNORMAL HIGH (ref 8–23)
CO2: 24 mmol/L (ref 22–32)
CREATININE: 2.29 mg/dL — AB (ref 0.44–1.00)
Calcium: 9.5 mg/dL (ref 8.9–10.3)
Chloride: 103 mmol/L (ref 98–111)
GFR calc Af Amer: 21 mL/min — ABNORMAL LOW (ref >60)
GFR, EST NON AFRICAN AMERICAN: 18 mL/min — AB (ref >60)
GLUCOSE: 100 mg/dL — AB (ref 70–99)
POTASSIUM: 3.4 mmol/L — AB (ref 3.5–5.1)
SODIUM: 140 mmol/L (ref 135–145)

## 2017-12-24 LAB — CBC
HCT: 36.1 % (ref 36.0–46.0)
HEMOGLOBIN: 12.2 g/dL (ref 12.0–15.0)
MCH: 29.4 pg (ref 26.0–34.0)
MCHC: 33.8 g/dL (ref 30.0–36.0)
MCV: 87 fL (ref 78.0–100.0)
PLATELETS: 172 10*3/uL (ref 150–400)
RBC: 4.15 MIL/uL (ref 3.87–5.11)
RDW: 14.4 % (ref 11.5–15.5)
WBC: 9.1 10*3/uL (ref 4.0–10.5)

## 2017-12-24 LAB — GLUCOSE, CAPILLARY
GLUCOSE-CAPILLARY: 129 mg/dL — AB (ref 70–99)
Glucose-Capillary: 96 mg/dL (ref 70–99)

## 2017-12-24 MED ORDER — HYPROMELLOSE (GONIOSCOPIC) 2.5 % OP SOLN
1.0000 [drp] | Freq: Two times a day (BID) | OPHTHALMIC | Status: DC | PRN
  Filled 2017-12-24: qty 15

## 2017-12-24 MED ORDER — FUROSEMIDE 40 MG PO TABS: 40.0000 mg | ORAL_TABLET | ORAL | Status: DC

## 2017-12-24 MED ORDER — APIXABAN 2.5 MG PO TABS
2.5000 mg | ORAL_TABLET | Freq: Two times a day (BID) | ORAL | Status: DC
  Administered 2017-12-24: 2.5 mg via ORAL
  Filled 2017-12-24: qty 1

## 2017-12-24 MED ORDER — POLYETHYLENE GLYCOL 3350 17 G PO PACK: 17.0000 g | PACK | Freq: Every day | ORAL | Status: DC | PRN

## 2017-12-24 MED ORDER — DILTIAZEM HCL ER COATED BEADS 120 MG PO CP24
120.0000 mg | ORAL_CAPSULE | Freq: Every day | ORAL | Status: DC
  Administered 2017-12-24: 120 mg via ORAL
  Filled 2017-12-24: qty 1

## 2017-12-24 MED ORDER — ISOSORBIDE MONONITRATE ER 30 MG PO TB24
30.0000 mg | ORAL_TABLET | Freq: Every day | ORAL | Status: DC
  Administered 2017-12-24: 30 mg via ORAL
  Filled 2017-12-24: qty 1

## 2017-12-24 MED ORDER — CARVEDILOL 25 MG PO TABS: 25.0000 mg | ORAL_TABLET | Freq: Every day | ORAL | Status: DC

## 2017-12-24 MED ORDER — FERROUS SULFATE 325 (65 FE) MG PO TABS: 325.0000 mg | ORAL_TABLET | Freq: Two times a day (BID) | ORAL | Status: DC

## 2017-12-24 MED ORDER — RESVERATROL 250 MG PO CAPS: 250.0000 mg | ORAL_CAPSULE | Freq: Every day | ORAL | Status: DC

## 2017-12-24 MED ORDER — HYDRALAZINE HCL 10 MG PO TABS
10.0000 mg | ORAL_TABLET | Freq: Three times a day (TID) | ORAL | Status: DC
  Administered 2017-12-24: 10 mg via ORAL
  Filled 2017-12-24: qty 1

## 2017-12-24 MED ORDER — SODIUM CHLORIDE (HYPERTONIC) 5 % OP SOLN
1.0000 [drp] | Freq: Four times a day (QID) | OPHTHALMIC | Status: DC
  Administered 2017-12-24: 1 [drp] via OPHTHALMIC
  Filled 2017-12-24: qty 15

## 2017-12-24 MED ORDER — POTASSIUM CHLORIDE CRYS ER 20 MEQ PO TBCR
40.0000 meq | EXTENDED_RELEASE_TABLET | Freq: Once | ORAL | Status: AC
  Administered 2017-12-24: 40 meq via ORAL
  Filled 2017-12-24: qty 2

## 2017-12-24 NOTE — Progress Notes (Signed)
SATURATION QUALIFICATIONS: (This note is used to comply with regulatory documentation for home oxygen)  Patient Saturations on Room Air at Rest = 94%  Patient Saturations on Room Air while Ambulating = 84%  Patient Saturations on 3 Liters of oxygen while Ambulating = 94%  Please briefly explain why patient needs home oxygen:

## 2017-12-24 NOTE — Discharge Summary (Signed)
Physician Discharge Summary  [name] DGL:[id number] DOB: 10/25/1925 DOA: 12/23/2017  PCP: Bernerd Limbo, MD  Admit date: 12/23/2017 Discharge date: 12/24/2017  Admitted From: home Discharge disposition: home   Recommendations for Outpatient Follow-Up:   1. Increase lasix for a few days then back to normal 2. Close cardiology follow up 3. BMP on Friday 4. Can wear O2 with exertion and at night for now- (does not need at rest)- was asymptomatic when walking off O2 down into the 80s   Discharge Diagnosis:   Principal Problem:   Acute on chronic congestive heart failure (HCC) Active Problems:   Diabetes mellitus without complication (HCC)   Congestive dilated cardiomyopathy (HCC)   PAF (paroxysmal atrial fibrillation) (HCC)   Acute and chronic respiratory failure (acute-on-chronic) (HCC)   CKD (chronic kidney disease), stage V ([place])    Discharge Condition: Improved.  Diet recommendation: Low sodium, heart healthy/carb mod  Wound care: None.  Code status: Full.   History of Present Illness:  Delightful 84 year old female presents with an acute exacerbation of congestive heart failure requiring oxygen continuously where she usually uses it at night only.  Evaluation is consistent with ischemia and tachypnea as well as increased JVD.  She will be placed in overnight observation for diuresis.   Hospital Course by Problem:   Acute  on chronic respiratory failure with hypoxia  In the setting of fluid overload due to CHF exacerbation/ ICM     Patient had IV Lasix  60 mg bid with good response and on the AM of 7/29 felt like she was back to her baseline and was anxious to go home -plan to resume 2x/day for 3 days then back to daily and PRN Close follow up with cardiology -BMP 1 week  Chest tightness likely due to above, with neg Tn and unremarkable EKG. Continue to monitor, patient chest pain free  Atrial Fibrillation on anticoagulation with Eliquis    Continue Eliquis and Norvasc   Chronic kidney disease stage V -close outpatient follow up  Hypertension  Resume  home anti-hypertensive medications      Type II Diabetes  -resume home meds       Medical Consultants:      Discharge Exam:   Vitals:   12/24/17 0955 12/24/17 1106  BP: (!) 156/82 139/82  Pulse: 87 85  Resp:  20  Temp:  98.8 F (37.1 C)  SpO2:  92%   Vitals:   12/24/17 0409 12/24/17 0412 12/24/17 0955 12/24/17 1106  BP:  (!) 155/82 (!) 156/82 139/82  Pulse:  (!) 103 87 85  Resp:  20  20  Temp:  98.6 F (37 C)  98.8 F (37.1 C)  TempSrc:  Oral  Oral  SpO2:  93%  92%  Weight: 57.5 kg (126 lb 12.8 oz)     Height:        General exam: Appears calm and comfortable-- anxious to go home-- family and patient feels she is back to her baseline   The results of significant diagnostics from this hospitalization (including imaging, microbiology, ancillary and laboratory) are listed below for reference.     Procedures and Diagnostic Studies:   Dg Chest 2 View  Result Date: 12/23/2017 CLINICAL DATA:  Chest tightness for several hours EXAM: CHEST - 2 VIEW COMPARISON:  12/26/2015 FINDINGS: Cardiac shadow is enlarged. Aortic calcifications are again seen. Diffuse vascular congestion and pulmonary edema is noted consistent with CHF. Old rib fractures are noted. Degenerative changes of the  shoulder joints are seen. Chronic compression deformities are noted. IMPRESSION: Changes consistent with significant CHF. Electronically Signed   By: Inez Catalina M.D.   On: 12/23/2017 07:27     Labs:   Basic Metabolic Panel: Recent Labs  Lab 12/23/17 0659 12/24/17 0549  NA 139 140  K 4.3 3.4*  CL 106 103  CO2 22 24  GLUCOSE 152* 100*  BUN 47* 47*  CREATININE 2.22* 2.29*  CALCIUM 9.7 9.5   GFR Estimated Creatinine Clearance: 15 mL/min (A) (by C-G formula based on SCr of 2.29 mg/dL (H)). Liver Function Tests: No results for input(s): AST, ALT, ALKPHOS,  BILITOT, PROT, ALBUMIN in the last 168 hours. No results for input(s): LIPASE, AMYLASE in the last 168 hours. No results for input(s): AMMONIA in the last 168 hours. Coagulation profile No results for input(s): INR, PROTIME in the last 168 hours.  CBC: Recent Labs  Lab 12/23/17 0659 12/24/17 0549  WBC 9.0 9.1  HGB 12.3 12.2  HCT 37.3 36.1  MCV 88.4 87.0  PLT 201 172   Cardiac Enzymes: No results for input(s): CKTOTAL, CKMB, CKMBINDEX, TROPONINI in the last 168 hours. BNP: Invalid input(s): POCBNP CBG: Recent Labs  Lab 12/23/17 1102 12/23/17 1637 12/23/17 2140 12/24/17 0744 12/24/17 1104  GLUCAP 131* 132* 113* 96 129*   D-Dimer No results for input(s): DDIMER in the last 72 hours. Hgb A1c No results for input(s): HGBA1C in the last 72 hours. Lipid Profile No results for input(s): CHOL, HDL, LDLCALC, TRIG, CHOLHDL, LDLDIRECT in the last 72 hours. Thyroid function studies No results for input(s): TSH, T4TOTAL, T3FREE, THYROIDAB in the last 72 hours.  Invalid input(s): FREET3 Anemia work up No results for input(s): VITAMINB12, FOLATE, FERRITIN, TIBC, IRON, RETICCTPCT in the last 72 hours. Microbiology No results found for this or any previous visit (from the past 240 hour(s)).   Discharge Instructions:   Discharge Instructions    (HEART FAILURE PATIENTS) Call MD:  Anytime you have any of the following symptoms: 1) 3 pound weight gain in 24 hours or 5 pounds in 1 week 2) shortness of breath, with or without a dry hacking cough 3) swelling in the hands, feet or stomach 4) if you have to sleep on extra pillows at night in order to breathe.   Complete by:  As directed    Diet - low sodium heart healthy   Complete by:  As directed    Discharge instructions   Complete by:  As directed    Take lasix BID x 3 days then resume daily plus as needed   Increase activity slowly   Complete by:  As directed      Allergies as of 12/24/2017      Reactions   Codeine Other (See  Comments)   "talks out of her head"   Neurontin [gabapentin] Swelling   Leg swelling   Oxycodone Other (See Comments)   confusion      Medication List    TAKE these medications   acetaminophen 500 MG tablet Commonly known as:  TYLENOL Take 1,000 mg by mouth every 6 (six) hours as needed for headache (pain).   apixaban 2.5 MG Tabs tablet Commonly known as:  ELIQUIS Take 1 tablet (2.5 mg total) by mouth 2 (two) times daily.   carvedilol 25 MG tablet Commonly known as:  COREG Take 1 tablet (25 mg total) by mouth at bedtime.   diltiazem 120 MG 24 hr capsule Commonly known as:  CARDIZEM CD Take 1  capsule (120 mg total) by mouth daily.   diphenhydramine-acetaminophen 25-500 MG Tabs tablet Commonly known as:  TYLENOL PM Take 2 tablets by mouth at bedtime.   ferrous sulfate 325 (65 FE) MG tablet Take 325 mg by mouth 2 (two) times daily with a meal.   furosemide 40 MG tablet Commonly known as:  LASIX Take 1 tablet (40 mg total) by mouth See admin instructions. Take one tablet (40 mg) by mouth every morning, may take an extra tablet at lunch as needed for weight gain of 3 lbs or more or swelling What changed:    when to take this  additional instructions   hydrALAZINE 10 MG tablet Commonly known as:  APRESOLINE Take 1 tablet (10 mg total) by mouth 3 (three) times daily.   isosorbide mononitrate 30 MG 24 hr tablet Commonly known as:  IMDUR Take 30 mg by mouth daily. What changed:  Another medication with the same name was removed. Continue taking this medication, and follow the directions you see here.   LUTEIN PO Take 25 mg by mouth daily.   OXYGEN Inhale 3 L into the lungs See admin instructions. Use every night at bedtime and during the day if needed for shortness of breath   polyethylene glycol packet Commonly known as:  MIRALAX / GLYCOLAX Take 17 g by mouth daily as needed (constipation).   Resveratrol 250 MG Caps Take 250 mg by mouth daily.   saxagliptin  HCl 2.5 MG Tabs tablet Commonly known as:  ONGLYZA Take 2.5 mg by mouth daily.   sodium chloride 5 % ophthalmic solution Commonly known as:  MURO 128 Place 1 drop into the left eye 4 (four) times daily.   SYSTANE COMPLETE 0.6 % Soln Generic drug:  Propylene Glycol Place 1 drop into both eyes 2 (two) times daily as needed (dry eyes).      Follow-up Information    Bernerd Limbo, MD Follow up in 1 week(s).   Specialty:  Family Medicine Contact information: [place] [id number] [phone]            Time coordinating discharge: 35 min  Signed:  Geradine Girt  [facility] 12/24/2017, 1:00 PM

## 2017-12-24 NOTE — Progress Notes (Signed)
Discharge to home, daughter at bedside. Patient has already home O2 at  home with [facility]. Discharge instructions and follow up appointments discussed with patient and daughter, verbalized understanding.

## 2017-12-24 NOTE — Discharge Instructions (Signed)
Can wear O2 24/7 with exertion at night BMp 1 week

## 2018-01-03 NOTE — Progress Notes (Signed)
HPI: Follow-up atrial fibrillation and congestive heart failure. Had fall 12/16. She had rib fractures and fracture of her right scapula. She was treated conservatively. She was noted to be in atrial fibrillation at that time. Echocardiogram revealed Ejection fraction 55-60%, mild aortic insufficiency and mild mitral regurgitation. She was treated with rate control and discharged. Reeadmitted 3/17 with CHF. Echocardiogram repeated and showed ejection fraction 35-40%, biatrial enlargement, moderate aortic insufficiency, severe mitral regurgitation and mild to moderate tricuspid regurgitation. Patient has requested only conservative measures given her age which we thought was appropriate. She also has renal insufficiency.  Patient admitted July 2019 with congestive heart failure.  She was treated with IV Lasix with improvement in discharge. Since last seen,  she has some dyspnea on exertion.  No orthopnea, PND, pedal edema, chest pain or syncope.  She did fall recently while wearing an eye patch.  Current Outpatient Medications  Medication Sig Dispense Refill  . acetaminophen (TYLENOL) 500 MG tablet Take 1,000 mg by mouth every 6 (six) hours as needed for headache (pain).     [name] apixaban (ELIQUIS) 2.5 MG TABS tablet Take 1 tablet (2.5 mg total) by mouth 2 (two) times daily. 180 tablet 3  . carvedilol (COREG) 25 MG tablet Take 1 tablet (25 mg total) by mouth at bedtime. 90 tablet 2  . diltiazem (CARDIZEM CD) 120 MG 24 hr capsule Take 1 capsule (120 mg total) by mouth daily. 90 capsule 1  . diphenhydramine-acetaminophen (TYLENOL PM) 25-500 MG TABS tablet Take 2 tablets by mouth at bedtime.     . ferrous sulfate 325 (65 FE) MG tablet Take 325 mg by mouth 2 (two) times daily with a meal.     . furosemide (LASIX) 40 MG tablet Take 1 tablet (40 mg total) by mouth See admin instructions. Take one tablet (40 mg) by mouth every morning, may take an extra tablet at lunch as needed for weight gain of 3 lbs or  more or swelling    . hydrALAZINE (APRESOLINE) 10 MG tablet Take 1 tablet (10 mg total) by mouth 3 (three) times daily. 270 tablet 2  . isosorbide mononitrate (IMDUR) 30 MG 24 hr tablet Take 30 mg by mouth daily.    [name] LORazepam (ATIVAN) 0.5 MG tablet Take 0.5 mg by mouth at bedtime.  0  . LUTEIN PO Take 25 mg by mouth daily.     [name] LYRICA 25 MG capsule Take 25 mg by mouth 2 (two) times daily as needed.  1  . OXYGEN Inhale 3 L into the lungs See admin instructions. Use every night at bedtime and during the day if needed for shortness of breath    . polyethylene glycol (MIRALAX / GLYCOLAX) packet Take 17 g by mouth daily as needed (constipation).     . Propylene Glycol (SYSTANE COMPLETE) 0.6 % SOLN Place 1 drop into both eyes 2 (two) times daily as needed (dry eyes).    [name] Resveratrol 250 MG CAPS Take 250 mg by mouth daily.    . saxagliptin HCl (ONGLYZA) 2.5 MG TABS tablet Take 2.5 mg by mouth daily.     . sodium chloride (MURO 128) 5 % ophthalmic solution Place 1 drop into the left eye 4 (four) times daily.     No current facility-administered medications for this visit.      Past Medical History:  Diagnosis Date  . Cancer [facility])    breast cancer  . Cardiomyopathy ([place])    a. 04/2015 Echo:  EF 55-60%;  b. 07/2015 Echo: EF 35-40%, mod LVH, antsep DK, mod AI, sev MR, sev dil LA, mildly dil RA, mild-mod TR, PASP 24mmHg-->conservatively managed.  . Chronic systolic CHF (congestive heart failure) ([place])    a. 07/2015 Echo: EF 35-40%.  . CKD (chronic kidney disease), stage IV ([place])   . Diabetes mellitus without complication ([place])   . Essential hypertension   . HLD (hyperlipidemia)   . Permanent atrial fibrillation ([place])    a. diagnosed 04/2015 --> conservative mgmt with rate control and xarelto.  . Pulsatile neck mass    a. 11/2015 Neck U/S: pulsatile R neck mass correlates w/ a toruous and mildy ectatic segment of the R SCA measuring ~ 2.1 cm in max diameter - no change since noted on 12/16 CT.  [name]  Severe mitral regurgitation by prior echocardiogram    a. 07/2015 sev MR    Past Surgical History:  Procedure Laterality Date  . ABDOMINAL HYSTERECTOMY    . BREAST LUMPECTOMY    . FRACTURE SURGERY     L femur  . JOINT REPLACEMENT     knees    Social History   Socioeconomic History  . Marital status: Widowed    Spouse name: Not on file  . Number of children: Not on file  . Years of education: Not on file  . Highest education level: Not on file  Occupational History  . Not on file  Social Needs  . Financial resource strain: Not on file  . Food insecurity:    Worry: Not on file    Inability: Not on file  . Transportation needs:    Medical: Not on file    Non-medical: Not on file  Tobacco Use  . Smoking status: Never Smoker  . Smokeless tobacco: Never Used  Substance and Sexual Activity  . Alcohol use: No    Alcohol/week: 0.0 standard drinks  . Drug use: No  . Sexual activity: Never  Lifestyle  . Physical activity:    Days per week: Not on file    Minutes per session: Not on file  . Stress: Not on file  Relationships  . Social connections:    Talks on phone: Not on file    Gets together: Not on file    Attends religious service: Not on file    Active member of club or organization: Not on file    Attends meetings of clubs or organizations: Not on file    Relationship status: Not on file  . Intimate partner violence:    Fear of current or ex partner: Not on file    Emotionally abused: Not on file    Physically abused: Not on file    Forced sexual activity: Not on file  Other Topics Concern  . Not on file  Social History Narrative  . Not on file    Family History  Problem Relation Age of Onset  . Coronary artery disease Father   . Stroke Mother     ROS: no fevers or chills, productive cough, hemoptysis, dysphasia, odynophagia, melena, hematochezia, dysuria, hematuria, rash, seizure activity, orthopnea, PND, pedal edema, claudication. Remaining systems  are negative.  Physical Exam: Well-developed elderly, frail in no acute distress.  Skin is warm and dry.  HEENT with ecchymosis under left eye Neck is supple.  Chest is clear to auscultation with normal expansion.  Cardiovascular exam is irregular Abdominal exam nontender or distended. No masses palpated. Extremities show no edema. neuro grossly intact  A/P  1 chronic combined systolic/diastolic congestive heart failure-patient appears to be euvolemic this morning.  Continue present dose of Lasix.  Take 40 mg in the morning with an additional 40 mg daily for weight gain of 2 pounds.  Continue fluid restriction and low-sodium diet.  2 permanent atrial fibrillation-we will continue with calcium blocker and beta-blocker for rate control.  Continue apixaban.  3 cardiomyopathy-patient does not want aggressive evaluation.  She does not want imaging studies.  We will continue with hydralazine/nitrates and beta-blocker.  4 mitral regurgitation-patient only wants conservative measures and no aggressive intervention.  No plans for repeating echocardiogram.  5 chronic stage IV kidney disease-we will recheck potassium and renal function when she returns in 3 months.  6 no CODE BLUE status  Kirk Ruths, MD

## 2018-01-07 ENCOUNTER — Ambulatory Visit (INDEPENDENT_AMBULATORY_CARE_PROVIDER_SITE_OTHER): Payer: Medicare Other | Admitting: Cardiology

## 2018-01-07 ENCOUNTER — Encounter: Payer: Self-pay | Admitting: Cardiology

## 2018-01-07 VITALS — BP 130/68 | HR 77 | Ht 61.0 in | Wt 131.0 lb

## 2018-01-07 DIAGNOSIS — I340 Nonrheumatic mitral (valve) insufficiency: Secondary | ICD-10-CM | POA: Diagnosis not present

## 2018-01-07 DIAGNOSIS — I5042 Chronic combined systolic (congestive) and diastolic (congestive) heart failure: Secondary | ICD-10-CM | POA: Diagnosis not present

## 2018-01-07 DIAGNOSIS — I482 Chronic atrial fibrillation: Secondary | ICD-10-CM

## 2018-01-07 DIAGNOSIS — I4821 Permanent atrial fibrillation: Secondary | ICD-10-CM

## 2018-01-07 DIAGNOSIS — I420 Dilated cardiomyopathy: Secondary | ICD-10-CM

## 2018-01-07 NOTE — Patient Instructions (Signed)
Your physician recommends that you schedule a follow-up appointment in: [place]  If you need a refill on your cardiac medications before your next appointment, please call your pharmacy.

## 2018-02-01 ENCOUNTER — Encounter: Payer: Self-pay | Admitting: Podiatry

## 2018-02-01 ENCOUNTER — Ambulatory Visit (INDEPENDENT_AMBULATORY_CARE_PROVIDER_SITE_OTHER): Payer: Medicare Other | Admitting: Podiatry

## 2018-02-01 DIAGNOSIS — D689 Coagulation defect, unspecified: Secondary | ICD-10-CM

## 2018-02-01 DIAGNOSIS — L84 Corns and callosities: Secondary | ICD-10-CM | POA: Diagnosis not present

## 2018-02-01 DIAGNOSIS — M79675 Pain in left toe(s): Secondary | ICD-10-CM

## 2018-02-01 DIAGNOSIS — B351 Tinea unguium: Secondary | ICD-10-CM | POA: Diagnosis not present

## 2018-02-01 DIAGNOSIS — E1140 Type 2 diabetes mellitus with diabetic neuropathy, unspecified: Secondary | ICD-10-CM | POA: Diagnosis not present

## 2018-02-01 DIAGNOSIS — M79674 Pain in right toe(s): Secondary | ICD-10-CM

## 2018-02-01 NOTE — Progress Notes (Signed)
Complaint:  Visit Type: Patient returns to my office for continued preventative foot care services. Complaint: Patient states" my nails have grown long and thick and become painful to walk and wear shoes" Patient has been diagnosed with DM . The patient presents for preventative foot care services. No changes to ROS.  Patient is taking eliquiss.  Podiatric Exam: Vascular: dorsalis pedis and posterior tibial pulses are palpable bilateral. Capillary return is immediate. Temperature gradient is WNL. Skin turgor WNL  Sensorium: Normal Semmes Weinstein monofilament test. Normal tactile sensation bilaterally. Nail Exam: Pt has thick disfigured discolored nails with subungual debris noted bilateral entire nail hallux through fifth toenails Ulcer Exam: There is no evidence of ulcer or pre-ulcerative changes or infection. Orthopedic Exam: Muscle tone and strength are WNL. No limitations in general ROM. No crepitus or effusions noted. HAV  B/L.   Skin: No Porokeratosis. No infection or ulcers.   Callus sub 1 right. Diagnosis:  Onychomycosis, , Pain in right toe, pain in left toes,  Debride callus.  Treatment & Plan Procedures and Treatment: Consent by patient was obtained for treatment procedures.   Debridement of mycotic and hypertrophic toenails, 1 through 5 bilateral and clearing of subungual debris. No ulceration, no infection noted.  Return Visit-Office Procedure: Patient instructed to return to the office for a follow up visit 3 months for continued evaluation and treatment.    Gardiner Barefoot DPM

## 2018-02-07 ENCOUNTER — Other Ambulatory Visit: Payer: Self-pay | Admitting: Cardiology

## 2018-02-07 ENCOUNTER — Other Ambulatory Visit: Payer: Self-pay

## 2018-02-07 DIAGNOSIS — I5042 Chronic combined systolic (congestive) and diastolic (congestive) heart failure: Secondary | ICD-10-CM

## 2018-02-07 MED ORDER — FUROSEMIDE 40 MG PO TABS: 40.0000 mg | ORAL_TABLET | ORAL | 1 refills | Status: DC

## 2018-02-07 MED ORDER — FUROSEMIDE 40 MG PO TABS: ORAL_TABLET | ORAL | 1 refills | Status: DC

## 2018-02-07 MED ORDER — DILTIAZEM HCL ER COATED BEADS 120 MG PO CP24: 120.0000 mg | ORAL_CAPSULE | Freq: Every day | ORAL | 1 refills | Status: DC

## 2018-02-07 NOTE — Telephone Encounter (Signed)
RX submitted to pharmacy

## 2018-02-07 NOTE — Telephone Encounter (Signed)
°*  STAT* If patient is at the pharmacy, call can be transferred to refill team.   1. Which medications need to be refilled? (please list name of each medication and dose if known) Furosemide and Cardizem  2. Which pharmacy/location (including street and city if local pharmacy) is medication to be sent to? [place] by [phone]  3. Do they need a 30 day or 90 day supply? 90 days supply and refill

## 2018-02-27 ENCOUNTER — Telehealth: Payer: Self-pay | Admitting: Cardiology

## 2018-02-27 NOTE — Telephone Encounter (Signed)
Returned call to pt's daughter [name], [place] on file. lmtcb   Spoke with pt's daughter [name]. The pt has had a 3lb weight gain in 3 days. She is prescribed.Lasix 40mg  daily and an additional 20mg  daily prn for weight gain. The pt has been taken Lasix 80mg  daily x 3 days. Her weight has continued to increase. The pt denies sob, orthopnea, palpitations, chest pain, she is having LE swelling. She was prescribed Augmentin x10days for a respiratory infection, she has 3 days left. [name] sts that the pt O2 sat's has been good. She would like to see Dr.Crenshaw only. appt scheduled for 10/3 @2pm  with Dr.Crenshaw. Pt daughter aware and voiced appreciation for the assistance.

## 2018-02-27 NOTE — Telephone Encounter (Signed)
New Message          Pt c/o swelling: STAT is pt has developed SOB within 24 hours  1) How much weight have you gained and in what time span? Gained 3 pounds in 3 days  2) If swelling, where is the swelling located? Ankles  3) Are you currently taking a fluid pill? Yes  4) Are you currently SOB? No   5) Do you have a log of your daily weights (if so, list)? (09/25129) (09/30131) (10/02132)  6) Have you gained 3 pounds in a day or 5 pounds in a week? No  7) Have you traveled recently? No

## 2018-02-28 ENCOUNTER — Ambulatory Visit (INDEPENDENT_AMBULATORY_CARE_PROVIDER_SITE_OTHER): Payer: Medicare Other | Admitting: Cardiology

## 2018-02-28 ENCOUNTER — Encounter: Payer: Self-pay | Admitting: Cardiology

## 2018-02-28 VITALS — BP 128/74 | HR 87 | Ht 61.0 in | Wt 132.0 lb

## 2018-02-28 DIAGNOSIS — I340 Nonrheumatic mitral (valve) insufficiency: Secondary | ICD-10-CM | POA: Diagnosis not present

## 2018-02-28 DIAGNOSIS — I4891 Unspecified atrial fibrillation: Secondary | ICD-10-CM | POA: Diagnosis not present

## 2018-02-28 DIAGNOSIS — I5042 Chronic combined systolic (congestive) and diastolic (congestive) heart failure: Secondary | ICD-10-CM | POA: Diagnosis not present

## 2018-02-28 MED ORDER — TORSEMIDE 20 MG PO TABS: 20.0000 mg | ORAL_TABLET | Freq: Two times a day (BID) | ORAL | 3 refills | Status: DC

## 2018-02-28 NOTE — Patient Instructions (Signed)
Medication Instructions:   STOP FUROSEMIDE  START TORSEMIDE 20 MG ONE TABLET TWICE DAILY  Labwork:  Your physician recommends that you return for lab work Monday NEXT WEEK  Follow-Up:  Your physician recommends that you schedule a follow-up appointment in: [place] physician recommends that you schedule a follow-up appointment in: AS SCHEDULED WITH DR G[place]den    If you need a refill on your cardiac medications before your next appointment, please call your pharmacy.

## 2018-02-28 NOTE — Progress Notes (Signed)
HPI: Follow-up atrial fibrillation and congestive heart failure. Had fall 12/16. She had rib fractures and fracture of her right scapula. She was treated conservatively. She was noted to be in atrial fibrillation at that time. Echocardiogram revealed Ejection fraction 55-60%, mild aortic insufficiency and mild mitral regurgitation. She was treated with rate control and discharged. Reeadmitted 3/17 with CHF. Echocardiogram repeated and showed ejection fraction 35-40%, biatrial enlargement, moderate aortic insufficiency, severe mitral regurgitation and mild to moderate tricuspid regurgitation. Patient has requested only conservative measures given her age which we thought was appropriate. She also has renal insufficiency.  Patient admitted July 2019 with congestive heart failure.  She was treated with IV Lasix with improvement in discharge. Since last seen,  she notes mild increased dyspnea on exertion.  She has gained 3 pounds in the past week and has noticed increasing lower extremity edema as well.  No exertional chest pain or syncope.  Current Outpatient Medications  Medication Sig Dispense Refill  . acetaminophen (TYLENOL) 500 MG tablet Take 1,000 mg by mouth every 6 (six) hours as needed for headache (pain).     [name] apixaban (ELIQUIS) 2.5 MG TABS tablet Take 1 tablet (2.5 mg total) by mouth 2 (two) times daily. 180 tablet 3  . carvedilol (COREG) 25 MG tablet Take 1 tablet (25 mg total) by mouth at bedtime. 90 tablet 2  . diltiazem (CARDIZEM CD) 120 MG 24 hr capsule Take 1 capsule (120 mg total) by mouth daily. 90 capsule 1  . diphenhydramine-acetaminophen (TYLENOL PM) 25-500 MG TABS tablet Take 2 tablets by mouth at bedtime.     . ferrous sulfate 325 (65 FE) MG tablet Take 325 mg by mouth 2 (two) times daily with a meal.     . furosemide (LASIX) 40 MG tablet Take one tablet (40 mg) by mouth every morning, may take an extra tablet at lunch as needed for weight gain of 3 lbs or more or swelling  30 tablet 1  . hydrALAZINE (APRESOLINE) 10 MG tablet Take 1 tablet (10 mg total) by mouth 3 (three) times daily. 270 tablet 2  . isosorbide mononitrate (IMDUR) 30 MG 24 hr tablet Take 30 mg by mouth daily.    [name] LORazepam (ATIVAN) 0.5 MG tablet Take 0.5 mg by mouth at bedtime.  0  . LUTEIN PO Take 25 mg by mouth daily.     [name] LYRICA 25 MG capsule Take 25 mg by mouth 2 (two) times daily as needed.  1  . OXYGEN Inhale 3 L into the lungs See admin instructions. Use every night at bedtime and during the day if needed for shortness of breath    . polyethylene glycol (MIRALAX / GLYCOLAX) packet Take 17 g by mouth daily as needed (constipation).     . Propylene Glycol (SYSTANE COMPLETE) 0.6 % SOLN Place 1 drop into both eyes 2 (two) times daily as needed (dry eyes).    [name] Resveratrol 250 MG CAPS Take 250 mg by mouth daily.    . saxagliptin HCl (ONGLYZA) 2.5 MG TABS tablet Take 2.5 mg by mouth daily.     . sodium chloride (MURO 128) 5 % ophthalmic solution Place 1 drop into the left eye 4 (four) times daily.     No current facility-administered medications for this visit.      Past Medical History:  Diagnosis Date  . Cancer [facility])    breast cancer  . Cardiomyopathy ([place])    a. 04/2015 Echo: EF 55-60%;  b. 07/2015 Echo: EF 35-40%, mod LVH, antsep DK, mod AI, sev MR, sev dil LA, mildly dil RA, mild-mod TR, PASP 68mmHg-->conservatively managed.  . Chronic systolic CHF (congestive heart failure) ([place])    a. 07/2015 Echo: EF 35-40%.  . CKD (chronic kidney disease), stage IV ([place])   . Diabetes mellitus without complication ([place])   . Essential hypertension   . HLD (hyperlipidemia)   . Permanent atrial fibrillation    a. diagnosed 04/2015 --> conservative mgmt with rate control and xarelto.  . Pulsatile neck mass    a. 11/2015 Neck U/S: pulsatile R neck mass correlates w/ a toruous and mildy ectatic segment of the R SCA measuring ~ 2.1 cm in max diameter - no change since noted on 12/16 CT.  [name] Severe  mitral regurgitation by prior echocardiogram    a. 07/2015 sev MR    Past Surgical History:  Procedure Laterality Date  . ABDOMINAL HYSTERECTOMY    . BREAST LUMPECTOMY    . FRACTURE SURGERY     L femur  . JOINT REPLACEMENT     knees    Social History   Socioeconomic History  . Marital status: Widowed    Spouse name: Not on file  . Number of children: Not on file  . Years of education: Not on file  . Highest education level: Not on file  Occupational History  . Not on file  Social Needs  . Financial resource strain: Not on file  . Food insecurity:    Worry: Not on file    Inability: Not on file  . Transportation needs:    Medical: Not on file    Non-medical: Not on file  Tobacco Use  . Smoking status: Never Smoker  . Smokeless tobacco: Never Used  Substance and Sexual Activity  . Alcohol use: No    Alcohol/week: 0.0 standard drinks  . Drug use: No  . Sexual activity: Never  Lifestyle  . Physical activity:    Days per week: Not on file    Minutes per session: Not on file  . Stress: Not on file  Relationships  . Social connections:    Talks on phone: Not on file    Gets together: Not on file    Attends religious service: Not on file    Active member of club or organization: Not on file    Attends meetings of clubs or organizations: Not on file    Relationship status: Not on file  . Intimate partner violence:    Fear of current or ex partner: Not on file    Emotionally abused: Not on file    Physically abused: Not on file    Forced sexual activity: Not on file  Other Topics Concern  . Not on file  Social History Narrative  . Not on file    Family History  Problem Relation Age of Onset  . Coronary artery disease Father   . Stroke Mother     ROS: no fevers or chills, productive cough, hemoptysis, dysphasia, odynophagia, melena, hematochezia, dysuria, hematuria, rash, seizure activity, orthopnea, PND, claudication. Remaining systems are  negative.  Physical Exam: Well-developed frrail in no acute distress.  Skin is warm and dry.  HEENT is normal.  Neck is supple.  Chest is clear to auscultation with normal expansion.  Cardiovascular exam is irregular Abdominal exam nontender or distended. No masses palpated. Extremities show trace to 1+ edema. neuro grossly intact  ECG-atrial fibrillation at a rate of 87.  Left  bundle branch block.  Personally reviewed  A/P  1 acute on chronic combined systolic/diastolic congestive heart failure-she is volume overloaded today.  I will discontinue Lasix.  We will instead treat with Demadex 20 mg twice daily.  In 3 days we will check potassium and renal function.  She will continue fluid restriction and low-sodium diet.  She will return to see APP in 1 week.  2 permanent atrial fibrillation-plan to continue beta-blocker and calcium blocker for rate control.  Continue apixaban.  3 cardiomyopathy-patient has not wanted aggressive cardiac evaluation such as imaging studies.  Continue medical therapy with hydralazine/nitrates and beta-blocker.  4 mitral regurgitation-she wants only conservative measures given age and medical condition.  We will therefore not repeat echocardiogram.  5 chronic stage IV kidney disease-check potassium and renal function in 1 week.  6 no CODE BLUE status  Kirk Ruths, MD

## 2018-03-01 ENCOUNTER — Telehealth: Payer: Self-pay | Admitting: Cardiology

## 2018-03-01 DIAGNOSIS — I5042 Chronic combined systolic (congestive) and diastolic (congestive) heart failure: Secondary | ICD-10-CM

## 2018-03-01 NOTE — Telephone Encounter (Signed)
New call  Pt c/o medication issue:  1. Name of Medication:torsemide (DEMADEX) 20 MG tablet  2. How are you currently taking this medication (dosage and times per day)?twice a day  3. Are you having a reaction (difficulty breathing--STAT)? No   4. What is your medication issue?  Patient's daughter states that she has lost 3lbs overnight due to this medication.Please call to discuss.

## 2018-03-01 NOTE — Telephone Encounter (Signed)
oer appt: START TORSEMIDE 20 MG ONE TABLET TWICE DAILY Returned call pt Toya Smothers (daughter) (DPR) she states that the insurance nurse called and was concerned about the pt's 3# weight loss. Informed that this is ok however, if this continues thru the weekend to call back to discuss, verbalizes understanding

## 2018-03-04 LAB — BASIC METABOLIC PANEL
BUN / CREAT RATIO: 24 (ref 12–28)
BUN: 65 mg/dL — AB (ref 8–27)
CO2: 22 mmol/L (ref 20–29)
CREATININE: 2.73 mg/dL — AB (ref 0.57–1.00)
Calcium: 9.5 mg/dL (ref 8.7–10.3)
Chloride: 101 mmol/L (ref 96–106)
GFR calc Af Amer: 17 mL/min/{1.73_m2} — ABNORMAL LOW (ref >59)
GFR, EST NON AFRICAN AMERICAN: 15 mL/min/{1.73_m2} — AB (ref >59)
GLUCOSE: 106 mg/dL — AB (ref 65–99)
Potassium: 4.4 mmol/L (ref 3.5–5.2)
SODIUM: 142 mmol/L (ref 134–144)

## 2018-03-05 ENCOUNTER — Telehealth: Payer: Self-pay | Admitting: *Deleted

## 2018-03-05 DIAGNOSIS — N289 Disorder of kidney and ureter, unspecified: Secondary | ICD-10-CM

## 2018-03-05 NOTE — Telephone Encounter (Signed)
Advised patient of lab results, verbalized understanding. BMET orders placed in Epic

## 2018-03-05 NOTE — Telephone Encounter (Signed)
-----   Message from Lelon Perla, MD sent at 03/04/2018  5:37 PM EDT ----- Continue present meds; bmet 2 weeks Kirk Ruths

## 2018-03-08 ENCOUNTER — Encounter: Payer: Self-pay | Admitting: Physician Assistant

## 2018-03-08 ENCOUNTER — Ambulatory Visit (INDEPENDENT_AMBULATORY_CARE_PROVIDER_SITE_OTHER): Payer: Medicare Other | Admitting: Physician Assistant

## 2018-03-08 VITALS — BP 142/68 | HR 80 | Ht 61.0 in | Wt 127.2 lb

## 2018-03-08 DIAGNOSIS — N184 Chronic kidney disease, stage 4 (severe): Secondary | ICD-10-CM

## 2018-03-08 DIAGNOSIS — I10 Essential (primary) hypertension: Secondary | ICD-10-CM

## 2018-03-08 DIAGNOSIS — I340 Nonrheumatic mitral (valve) insufficiency: Secondary | ICD-10-CM

## 2018-03-08 DIAGNOSIS — I4821 Permanent atrial fibrillation: Secondary | ICD-10-CM | POA: Diagnosis not present

## 2018-03-08 DIAGNOSIS — I5042 Chronic combined systolic (congestive) and diastolic (congestive) heart failure: Secondary | ICD-10-CM

## 2018-03-08 DIAGNOSIS — E785 Hyperlipidemia, unspecified: Secondary | ICD-10-CM

## 2018-03-08 DIAGNOSIS — R221 Localized swelling, mass and lump, neck: Secondary | ICD-10-CM

## 2018-03-08 NOTE — Progress Notes (Signed)
Cardiology Office Note    Date:  03/08/2018   ID:  [name], DOB [birth year], MRN [id number]  PCP:  Bernerd Limbo, MD  Cardiologist:  Dr. Stanford Breed  Chief Complaint  Patient presents with  . Follow-up    seen for Dr. Stanford Breed.     History of Present Illness:  [name] is a 84 y.o. female with PMH of CKD stage IV, HLD, permanent atrial fibrillation, HTN, chronic systolic HF, and severe MR. in December 2016, patient had a fall and suffered a rib fracture-fracture of the right scapula.  She was noted to be in atrial fibrillation at the time.  Echocardiogram showed EF 55 to 60%, mild aortic insufficiency, mild MR.  She was treated with rate control and discharge.  Patient was readmitted in March 2017 with CHF.  Echocardiogram showed EF down to 35 to 40%, biatrial enlargement, moderate aortic insufficiency, severe MR, mild to moderate TR.  Patient requested only conservative measures given her age.  She was admitted in July 2019 with congestive heart failure and was treated with IV Lasix.  Patient was last seen by Dr. Stanford Breed on 03/27/2018, she has gained 3 pounds in the past week and is noticing some lower extremity edema as well.  Her Lasix 40 mg daily was stopped and that she was started on torsemide 20 mg twice daily.  She returns today for reassessment.  Patient presents today for cardiology office visit.  Her lower extremity edema has completely resolved.  She lost about 5 pounds.  Her last basic metabolic panel shows her creatinine trended up to 2.7, so original plan is to repeat basic metabolic panel in 2 weeks.  She denies any significant shortness of breath and says that she can go everywhere she want to go at home without any issue.  She occasionally walks outside when the weather is nice as well.  I am concerned of dehydration in this elderly patient, I recommend to move up the basic metabolic panel to early next week instead of in 2 weeks.  She denies any recent chest  discomfort.  I have continued her on the current medication.   Past Medical History:  Diagnosis Date  . Cancer [facility])    breast cancer  . Cardiomyopathy ([place])    a. 04/2015 Echo: EF 55-60%;  b. 07/2015 Echo: EF 35-40%, mod LVH, antsep DK, mod AI, sev MR, sev dil LA, mildly dil RA, mild-mod TR, PASP 36mmHg-->conservatively managed.  . Chronic systolic CHF (congestive heart failure) ([place])    a. 07/2015 Echo: EF 35-40%.  . CKD (chronic kidney disease), stage IV ([place])   . Diabetes mellitus without complication ([place])   . Essential hypertension   . HLD (hyperlipidemia)   . Permanent atrial fibrillation    a. diagnosed 04/2015 --> conservative mgmt with rate control and xarelto.  . Pulsatile neck mass    a. 11/2015 Neck U/S: pulsatile R neck mass correlates w/ a toruous and mildy ectatic segment of the R SCA measuring ~ 2.1 cm in max diameter - no change since noted on 12/16 CT.  [name] Severe mitral regurgitation by prior echocardiogram    a. 07/2015 sev MR    Past Surgical History:  Procedure Laterality Date  . ABDOMINAL HYSTERECTOMY    . BREAST LUMPECTOMY    . FRACTURE SURGERY     L femur  . JOINT REPLACEMENT     knees    Current Medications: Outpatient Medications Prior to Visit  Medication Sig Dispense Refill  .  acetaminophen (TYLENOL) 500 MG tablet Take 1,000 mg by mouth every 6 (six) hours as needed for headache (pain).     [name] apixaban (ELIQUIS) 2.5 MG TABS tablet Take 1 tablet (2.5 mg total) by mouth 2 (two) times daily. 180 tablet 3  . carvedilol (COREG) 25 MG tablet Take 1 tablet (25 mg total) by mouth at bedtime. 90 tablet 2  . diltiazem (CARDIZEM CD) 120 MG 24 hr capsule Take 1 capsule (120 mg total) by mouth daily. 90 capsule 1  . diphenhydramine-acetaminophen (TYLENOL PM) 25-500 MG TABS tablet Take 2 tablets by mouth at bedtime.     . ferrous sulfate 325 (65 FE) MG tablet Take 325 mg by mouth 2 (two) times daily with a meal.     . hydrALAZINE (APRESOLINE) 10 MG tablet Take 1  tablet (10 mg total) by mouth 3 (three) times daily. 270 tablet 2  . isosorbide mononitrate (IMDUR) 30 MG 24 hr tablet Take 30 mg by mouth daily.    [name] LORazepam (ATIVAN) 0.5 MG tablet Take 0.5 mg by mouth at bedtime.  0  . LUTEIN PO Take 25 mg by mouth daily.     [name] LYRICA 25 MG capsule Take 25 mg by mouth 2 (two) times daily as needed.  1  . OXYGEN Inhale 3 L into the lungs See admin instructions. Use every night at bedtime and during the day if needed for shortness of breath    . polyethylene glycol (MIRALAX / GLYCOLAX) packet Take 17 g by mouth daily as needed (constipation).     . Propylene Glycol (SYSTANE COMPLETE) 0.6 % SOLN Place 1 drop into both eyes 2 (two) times daily as needed (dry eyes).    [name] Resveratrol 250 MG CAPS Take 250 mg by mouth daily.    . saxagliptin HCl (ONGLYZA) 2.5 MG TABS tablet Take 2.5 mg by mouth daily.     . sodium chloride (MURO 128) 5 % ophthalmic solution Place 1 drop into the left eye 4 (four) times daily.    [name] torsemide (DEMADEX) 20 MG tablet Take 1 tablet (20 mg total) by mouth 2 (two) times daily. 180 tablet 3   No facility-administered medications prior to visit.      Allergies:   Codeine; Neurontin [gabapentin]; and Oxycodone   Social History   Socioeconomic History  . Marital status: Widowed    Spouse name: Not on file  . Number of children: Not on file  . Years of education: Not on file  . Highest education level: Not on file  Occupational History  . Not on file  Social Needs  . Financial resource strain: Not on file  . Food insecurity:    Worry: Not on file    Inability: Not on file  . Transportation needs:    Medical: Not on file    Non-medical: Not on file  Tobacco Use  . Smoking status: Never Smoker  . Smokeless tobacco: Never Used  Substance and Sexual Activity  . Alcohol use: No    Alcohol/week: 0.0 standard drinks  . Drug use: No  . Sexual activity: Never  Lifestyle  . Physical activity:    Days per week: Not on file     Minutes per session: Not on file  . Stress: Not on file  Relationships  . Social connections:    Talks on phone: Not on file    Gets together: Not on file    Attends religious service: Not on file    Active  member of club or organization: Not on file    Attends meetings of clubs or organizations: Not on file    Relationship status: Not on file  Other Topics Concern  . Not on file  Social History Narrative  . Not on file     Family History:  The patient's family history includes Coronary artery disease in her father; Stroke in her mother.   ROS:   Please see the history of present illness.    ROS All other systems reviewed and are negative.   PHYSICAL EXAM:   VS:  BP (!) 142/68   Pulse 80   Ht 5\' 1"  (1.549 m)   Wt 127 lb 3.2 oz (57.7 kg)   BMI 24.03 kg/m    GEN: frail HEENT: normal  Neck: no JVD, carotid bruits. R neck pulsatile mass Cardiac: irregularly irregular; no murmurs, rubs, or gallops,no edema  Respiratory:  clear to auscultation bilaterally, normal work of breathing GI: soft, nontender, nondistended, + BS MS: no deformity or atrophy  Skin: warm and dry, no rash Neuro:  Alert and Oriented x 3, Strength and sensation are intact Psych: euthymic mood, full affect  Wt Readings from Last 3 Encounters:  03/08/18 127 lb 3.2 oz (57.7 kg)  02/28/18 132 lb (59.9 kg)  01/07/18 131 lb (59.4 kg)      Studies/Labs Reviewed:   EKG:  EKG is not ordered today.    Recent Labs: 12/24/2017: Hemoglobin 12.2; Platelets 172 03/04/2018: BUN 65; Creatinine, Ser 2.73; Potassium 4.4; Sodium 142   Lipid Panel No results found for: CHOL, TRIG, HDL, CHOLHDL, VLDL, LDLCALC, LDLDIRECT  Additional studies/ records that were reviewed today include:   Echo 08/03/2015 LV EF: 35% -   40%  Study Conclusions  - Left ventricle: Wall thickness was increased in a pattern of   moderate LVH. There was focal basal hypertrophy. Systolic   function was moderately reduced. The estimated  ejection fraction   was in the range of 35% to 40%. Dyskinesis of the anteroseptal   myocardium. - Aortic valve: There was moderate regurgitation. - Mitral valve: There was severe regurgitation. - Left atrium: The atrium was severely dilated. - Right atrium: The atrium was mildly dilated. - Tricuspid valve: There was mild-moderate regurgitation. - Pulmonary arteries: Systolic pressure was mildly increased. PA   peak pressure: 33 mm Hg (S).    ASSESSMENT:    1. Chronic combined systolic and diastolic heart failure ([place])   2. Permanent atrial fibrillation   3. Essential hypertension   4. CKD (chronic kidney disease), stage IV ([place])   5. Hyperlipidemia, unspecified hyperlipidemia type   6. Severe mitral regurgitation   7. Neck mass      PLAN:  In order of problems listed above:  1. Chronic combined systolic and diastolic heart failure: Appears to be euvolemic on physical exam after switching to torsemide to 20 mg twice daily.  Instead of rechecking renal function in 2 weeks, I would recommend recheck basic metabolic panel early next week.  She has lost 5 pounds since started on the torsemide.  Her last creatinine was 2.7.  If creatinine continue to trend up, I would recommend decrease torsemide to 20 mg daily instead.  Note patient was previously on 40 mg daily of Lasix.  2. Permanent atrial fibrillation: Continue beta-blocker and Eliquis.  Heart rate well controlled  3. Cardiomyopathy: Continue carvedilol and the hydralazine/nitrate combo.  Patient does not wish aggressive work-up  4. Mitral regurgitation: Conservative management, previous echocardiogram demonstrated  severe MR  5. CKD stage IV: We will repeat basic metabolic panel early next week  6. Right neck mass: Apparently related to tortuosity of the subclavian artery based on the last carotid ultrasound  7. No CODE BLUE status:  Medication Adjustments/Labs and Tests Ordered: Current medicines are reviewed at length  with the patient today.  Concerns regarding medicines are outlined above.  Medication changes, Labs and Tests ordered today are listed in the Patient Instructions below. Patient Instructions  Medication Instructions:  Your physician recommends that you continue on your current medications as directed. Please refer to the Current Medication list given to you today. If you need a refill on your cardiac medications before your next appointment, please call your pharmacy.   Lab work: COMPLETE LABS ON Monday 03/11/2018 If you have labs (blood work) drawn today and your tests are completely normal, you will receive your results only by: [name] MyChart Message (if you have MyChart) OR . A paper copy in the mail If you have any lab test that is abnormal or we need to change your treatment, we will call you to review the results.  Testing/Procedures: NONE   Follow-Up: At [facility], you and your health needs are our priority.  As part of our continuing mission to provide you with exceptional heart care, we have created designated Provider Care Teams.  These Care Teams include your primary Cardiologist (physician) and Advanced Practice Providers (APPs -  Physician Assistants and Nurse Practitioners) who all work together to provide you with the care you need, when you need it. FOLLOW UP AS SCHEDULED  Any Other Special Instructions Will Be Listed Below (If Applicable).      Hilbert Corrigan, [place]  03/08/2018 10:07 AM    [address], [place], [place]  [address] Phone: [phone]; Fax: [phone]

## 2018-03-08 NOTE — Patient Instructions (Signed)
Medication Instructions:  Your physician recommends that you continue on your current medications as directed. Please refer to the Current Medication list given to you today. If you need a refill on your cardiac medications before your next appointment, please call your pharmacy.   Lab work: COMPLETE LABS ON Monday 03/11/2018 If you have labs (blood work) drawn today and your tests are completely normal, you will receive your results only by: [name] MyChart Message (if you have MyChart) OR . A paper copy in the mail If you have any lab test that is abnormal or we need to change your treatment, we will call you to review the results.  Testing/Procedures: NONE   Follow-Up: At [facility], you and your health needs are our priority.  As part of our continuing mission to provide you with exceptional heart care, we have created designated Provider Care Teams.  These Care Teams include your primary Cardiologist (physician) and Advanced Practice Providers (APPs -  Physician Assistants and Nurse Practitioners) who all work together to provide you with the care you need, when you need it. FOLLOW UP AS SCHEDULED  Any Other Special Instructions Will Be Listed Below (If Applicable).

## 2018-03-11 ENCOUNTER — Telehealth: Payer: Self-pay | Admitting: Cardiology

## 2018-03-11 NOTE — Telephone Encounter (Signed)
Spoke with pt's daughter who was questioning when pt is to return for repeat BMP and decrease Torsemide. Informed that per last OV on 03/08/18 Almyra Deforest, PA had recommend instead of waiting 2 weeks, to come early this week for repeat and based of kidney function, he would determine at that time whether pt needs to decrease torsemide to 20 mg daily instead of BID. Daughter verbalized understanding.

## 2018-03-11 NOTE — Telephone Encounter (Signed)
° ° °  Pt c/o medication issue:  1. Name of Medication: torsemide (DEMADEX) 20 MG tablet  2. How are you currently taking this medication (dosage and times per day)? Take 1 tablet (20 mg total) by mouth 2 (two) times daily.  3. Are you having a reaction (difficulty breathing--STAT)? no  4. What is your medication issue? Daughter calling to request dosage be decreased and wants to confirm when repeat labs should be done

## 2018-03-13 ENCOUNTER — Telehealth: Payer: Self-pay | Admitting: *Deleted

## 2018-03-13 DIAGNOSIS — I5042 Chronic combined systolic (congestive) and diastolic (congestive) heart failure: Secondary | ICD-10-CM

## 2018-03-13 LAB — BASIC METABOLIC PANEL
BUN / CREAT RATIO: 33 — AB (ref 12–28)
BUN: 85 mg/dL — AB (ref 8–27)
CHLORIDE: 97 mmol/L (ref 96–106)
CO2: 24 mmol/L (ref 20–29)
CREATININE: 2.56 mg/dL — AB (ref 0.57–1.00)
Calcium: 9.7 mg/dL (ref 8.7–10.3)
GFR calc non Af Amer: 16 mL/min/{1.73_m2} — ABNORMAL LOW (ref >59)
GFR, EST AFRICAN AMERICAN: 19 mL/min/{1.73_m2} — AB (ref >59)
Glucose: 135 mg/dL — ABNORMAL HIGH (ref 65–99)
Potassium: 4.6 mmol/L (ref 3.5–5.2)
Sodium: 139 mmol/L (ref 134–144)

## 2018-03-13 MED ORDER — TORSEMIDE 20 MG PO TABS: 20.0000 mg | ORAL_TABLET | Freq: Every day | ORAL | 3 refills | Status: DC

## 2018-03-13 NOTE — Telephone Encounter (Addendum)
Spoke with pt, aware and repeated medication change. Lab orders mailed to the pt    ----- Message from Lelon Perla, MD sent at 03/13/2018  7:19 AM EDT ----- Hold demadex for one day and then decrease to 20 mg daily with additional 20 mg as needed for weight gain of 2-3 lbs; bmet one week Kirk Ruths

## 2018-03-19 LAB — BASIC METABOLIC PANEL
BUN / CREAT RATIO: 29 — AB (ref 12–28)
BUN: 66 mg/dL — ABNORMAL HIGH (ref 8–27)
CALCIUM: 9.6 mg/dL (ref 8.7–10.3)
CO2: 20 mmol/L (ref 20–29)
CREATININE: 2.3 mg/dL — AB (ref 0.57–1.00)
Chloride: 102 mmol/L (ref 96–106)
GFR, EST AFRICAN AMERICAN: 21 mL/min/{1.73_m2} — AB (ref >59)
GFR, EST NON AFRICAN AMERICAN: 18 mL/min/{1.73_m2} — AB (ref >59)
Glucose: 112 mg/dL — ABNORMAL HIGH (ref 65–99)
Potassium: 4.7 mmol/L (ref 3.5–5.2)
Sodium: 140 mmol/L (ref 134–144)

## 2018-03-21 ENCOUNTER — Other Ambulatory Visit: Payer: Self-pay | Admitting: Cardiology

## 2018-03-21 NOTE — Telephone Encounter (Signed)
 *  STAT* If patient is at the pharmacy, call can be transferred to refill team.   1. Which medications need to be refilled? (please list name of each medication and dose if known)  carvedilol (COREG) 25 MG tablet, Take 1 tablet (25 mg total) by mouth at bedtime  hydrALAZINE (APRESOLINE) 10 MG tablet.Take 1 tablet (10 mg total) by mouth 3 (three) times daily.  2. Which pharmacy/location (including street and city if local pharmacy) is medication to be sent to? [facility]  3. Do they need a 30 day or 90 day supply? 90 days

## 2018-03-22 MED ORDER — CARVEDILOL 25 MG PO TABS: 25.0000 mg | ORAL_TABLET | Freq: Every day | ORAL | 1 refills | Status: DC

## 2018-03-22 MED ORDER — HYDRALAZINE HCL 10 MG PO TABS: 10.0000 mg | ORAL_TABLET | Freq: Three times a day (TID) | ORAL | 1 refills | Status: DC

## 2018-03-28 NOTE — Progress Notes (Signed)
HPI: Follow-up atrial fibrillation and congestive heart failure. Had fall 12/16. She had rib fractures and fracture of her right scapula. She was treated conservatively. She was noted to be in atrial fibrillation at that time. Echocardiogram revealed Ejection fraction 55-60%, mild aortic insufficiency and mild mitral regurgitation. She was treated with rate control and discharged. Reeadmitted 3/17 with CHF. Echocardiogram repeated and showed ejection fraction 35-40%, biatrial enlargement, moderate aortic insufficiency, severe mitral regurgitation and mild to moderate tricuspid regurgitation. Patienthasrequested only conservative measures given her age which we thought was appropriate. She also has renal insufficiency.At previous office visit she was volume overloaded and Lasix was changed to Demadex.  She developed worsening renal insufficiency and dose was reduced to 20 mg daily with additional 20 mg.  Since last seen,she has mild dyspnea on exertion but no orthopnea, PND, pedal edema, chest pain or syncope.  Current Outpatient Medications  Medication Sig Dispense Refill  . acetaminophen (TYLENOL) 500 MG tablet Take 1,000 mg by mouth every 6 (six) hours as needed for headache (pain).     [name] apixaban (ELIQUIS) 2.5 MG TABS tablet Take 1 tablet (2.5 mg total) by mouth 2 (two) times daily. 180 tablet 3  . carvedilol (COREG) 25 MG tablet Take 1 tablet (25 mg total) by mouth at bedtime. 90 tablet 1  . diltiazem (CARDIZEM CD) 120 MG 24 hr capsule Take 1 capsule (120 mg total) by mouth daily. 90 capsule 1  . diphenhydramine-acetaminophen (TYLENOL PM) 25-500 MG TABS tablet Take 2 tablets by mouth at bedtime.     . ferrous sulfate 325 (65 FE) MG tablet Take 325 mg by mouth 2 (two) times daily with a meal.     . hydrALAZINE (APRESOLINE) 10 MG tablet Take 1 tablet (10 mg total) by mouth 3 (three) times daily. 270 tablet 1  . isosorbide mononitrate (IMDUR) 30 MG 24 hr tablet Take 30 mg by mouth daily.     [name] LORazepam (ATIVAN) 0.5 MG tablet Take 0.5 mg by mouth at bedtime.  0  . LUTEIN PO Take 25 mg by mouth daily.     [name] LYRICA 25 MG capsule Take 25 mg by mouth 2 (two) times daily as needed.  1  . OXYGEN Inhale 3 L into the lungs See admin instructions. Use every night at bedtime and during the day if needed for shortness of breath    . polyethylene glycol (MIRALAX / GLYCOLAX) packet Take 17 g by mouth daily as needed (constipation).     . Propylene Glycol (SYSTANE COMPLETE) 0.6 % SOLN Place 1 drop into both eyes 2 (two) times daily as needed (dry eyes).    [name] Resveratrol 250 MG CAPS Take 250 mg by mouth daily.    . saxagliptin HCl (ONGLYZA) 2.5 MG TABS tablet Take 2.5 mg by mouth daily.     . sodium chloride (MURO 128) 5 % ophthalmic solution Place 1 drop into the left eye 4 (four) times daily.    [name] torsemide (DEMADEX) 20 MG tablet Take 1 tablet (20 mg total) by mouth daily. 180 tablet 3   No current facility-administered medications for this visit.      Past Medical History:  Diagnosis Date  . Cancer [facility])    breast cancer  . Cardiomyopathy ([place])    a. 04/2015 Echo: EF 55-60%;  b. 07/2015 Echo: EF 35-40%, mod LVH, antsep DK, mod AI, sev MR, sev dil LA, mildly dil RA, mild-mod TR, PASP 58mmHg-->conservatively managed.  . Chronic systolic  CHF (congestive heart failure) ([place])    a. 07/2015 Echo: EF 35-40%.  . CKD (chronic kidney disease), stage IV ([place])   . Diabetes mellitus without complication ([place])   . Essential hypertension   . HLD (hyperlipidemia)   . Permanent atrial fibrillation    a. diagnosed 04/2015 --> conservative mgmt with rate control and xarelto.  . Pulsatile neck mass    a. 11/2015 Neck U/S: pulsatile R neck mass correlates w/ a toruous and mildy ectatic segment of the R SCA measuring ~ 2.1 cm in max diameter - no change since noted on 12/16 CT.  [name] Severe mitral regurgitation by prior echocardiogram    a. 07/2015 sev MR    Past Surgical History:  Procedure Laterality  Date  . ABDOMINAL HYSTERECTOMY    . BREAST LUMPECTOMY    . FRACTURE SURGERY     L femur  . JOINT REPLACEMENT     knees    Social History   Socioeconomic History  . Marital status: Widowed    Spouse name: Not on file  . Number of children: Not on file  . Years of education: Not on file  . Highest education level: Not on file  Occupational History  . Not on file  Social Needs  . Financial resource strain: Not on file  . Food insecurity:    Worry: Not on file    Inability: Not on file  . Transportation needs:    Medical: Not on file    Non-medical: Not on file  Tobacco Use  . Smoking status: Never Smoker  . Smokeless tobacco: Never Used  Substance and Sexual Activity  . Alcohol use: No    Alcohol/week: 0.0 standard drinks  . Drug use: No  . Sexual activity: Never  Lifestyle  . Physical activity:    Days per week: Not on file    Minutes per session: Not on file  . Stress: Not on file  Relationships  . Social connections:    Talks on phone: Not on file    Gets together: Not on file    Attends religious service: Not on file    Active member of club or organization: Not on file    Attends meetings of clubs or organizations: Not on file    Relationship status: Not on file  . Intimate partner violence:    Fear of current or ex partner: Not on file    Emotionally abused: Not on file    Physically abused: Not on file    Forced sexual activity: Not on file  Other Topics Concern  . Not on file  Social History Narrative  . Not on file    Family History  Problem Relation Age of Onset  . Coronary artery disease Father   . Stroke Mother     ROS: no fevers or chills, productive cough, hemoptysis, dysphasia, odynophagia, melena, hematochezia, dysuria, hematuria, rash, seizure activity, orthopnea, PND, pedal edema, claudication. Remaining systems are negative.  Physical Exam: Well-developed elderly in no acute distress.  Skin is warm and dry.  HEENT is normal.  Neck  is supple.  Chest is clear to auscultation with normal expansion.  Cardiovascular exam is irregular Abdominal exam nontender or distended. No masses palpated. Extremities show no edema. neuro grossly intact   A/P  1 chronic combined systolic/diastolic congestive heart failure-patient's volume status appears to have improved compared to previous office visit.  We will continue with present dose of Demadex.  Repeat potassium and renal function.  We discussed importance of fluid restriction and low-sodium diet.  2 permanent atrial fibrillation-continue calcium blocker and beta-blocker.  Continue apixaban.  3 cardiomyopathy-patient has declined further evaluation previously.  She does not want aggressive cardiac testing.  Continue beta-blocker and hydralazine/nitrates.  No ARB or Entresto given baseline renal insufficiency.  4 mitral regurgitation-as outlined she only wants conservative measures.  We have therefore elected not to repeat echocardiograms as she would not agree to aggressive interventions.  5 chronic stage IV kidney disease-check potassium and renal function.  6 no CODE BLUE  Kirk Ruths, MD

## 2018-04-09 ENCOUNTER — Encounter: Payer: Self-pay | Admitting: Cardiology

## 2018-04-09 ENCOUNTER — Ambulatory Visit (INDEPENDENT_AMBULATORY_CARE_PROVIDER_SITE_OTHER): Payer: Medicare Other | Admitting: Cardiology

## 2018-04-09 VITALS — BP 142/56 | HR 76 | Ht 60.0 in | Wt 130.0 lb

## 2018-04-09 DIAGNOSIS — I4821 Permanent atrial fibrillation: Secondary | ICD-10-CM

## 2018-04-09 DIAGNOSIS — I340 Nonrheumatic mitral (valve) insufficiency: Secondary | ICD-10-CM

## 2018-04-09 DIAGNOSIS — I420 Dilated cardiomyopathy: Secondary | ICD-10-CM

## 2018-04-09 DIAGNOSIS — I5023 Acute on chronic systolic (congestive) heart failure: Secondary | ICD-10-CM | POA: Diagnosis not present

## 2018-04-09 LAB — BASIC METABOLIC PANEL
BUN/Creatinine Ratio: 23 (ref 12–28)
BUN: 61 mg/dL — AB (ref 10–36)
CO2: 20 mmol/L (ref 20–29)
CREATININE: 2.66 mg/dL — AB (ref 0.57–1.00)
Calcium: 9.7 mg/dL (ref 8.7–10.3)
Chloride: 104 mmol/L (ref 96–106)
GFR, EST AFRICAN AMERICAN: 18 mL/min/{1.73_m2} — AB (ref >59)
GFR, EST NON AFRICAN AMERICAN: 15 mL/min/{1.73_m2} — AB (ref >59)
GLUCOSE: 107 mg/dL — AB (ref 65–99)
Potassium: 4.8 mmol/L (ref 3.5–5.2)
SODIUM: 141 mmol/L (ref 134–144)

## 2018-04-09 NOTE — Patient Instructions (Signed)
Medication Instructions:  Your physician recommends that you continue on your current medications as directed. Please refer to the Current Medication list given to you today.  If you need a refill on your cardiac medications before your next appointment, please call your pharmacy.   Lab work: Art gallery manager today If you have labs (blood work) drawn today and your tests are completely normal, you will receive your results only by: [name] MyChart Message (if you have MyChart) OR . A paper copy in the mail If you have any lab test that is abnormal or we need to change your treatment, we will call you to review the results.  Testing/Procedures: None ordered Follow-Up: At [facility], you and your health needs are our priority.  As part of our continuing mission to provide you with exceptional heart care, we have created designated Provider Care Teams.  These Care Teams include your primary Cardiologist (physician) and Advanced Practice Providers (APPs -  Physician Assistants and Nurse Practitioners) who all work together to provide you with the care you need, when you need it. You will need a follow up appointment in 3 months with Dr.Crenshaw.     Any Other Special Instructions Will Be Listed Below (If Applicable).

## 2018-05-03 ENCOUNTER — Ambulatory Visit (INDEPENDENT_AMBULATORY_CARE_PROVIDER_SITE_OTHER): Payer: Medicare Other | Admitting: Podiatry

## 2018-05-03 ENCOUNTER — Encounter: Payer: Self-pay | Admitting: Podiatry

## 2018-05-03 DIAGNOSIS — M79674 Pain in right toe(s): Secondary | ICD-10-CM | POA: Diagnosis not present

## 2018-05-03 DIAGNOSIS — D689 Coagulation defect, unspecified: Secondary | ICD-10-CM | POA: Diagnosis not present

## 2018-05-03 DIAGNOSIS — M79675 Pain in left toe(s): Secondary | ICD-10-CM | POA: Diagnosis not present

## 2018-05-03 DIAGNOSIS — B351 Tinea unguium: Secondary | ICD-10-CM

## 2018-05-03 DIAGNOSIS — E1140 Type 2 diabetes mellitus with diabetic neuropathy, unspecified: Secondary | ICD-10-CM

## 2018-05-03 DIAGNOSIS — L84 Corns and callosities: Secondary | ICD-10-CM | POA: Diagnosis not present

## 2018-05-03 NOTE — Progress Notes (Signed)
Complaint:  Visit Type: Patient returns to my office for continued preventative foot care services. Complaint: Patient states" my nails have grown long and thick and become painful to walk and wear shoes" Patient has been diagnosed with DM . The patient presents for preventative foot care services. No changes to ROS.  Patient is taking eliquiss.  Podiatric Exam: Vascular: dorsalis pedis and posterior tibial pulses are palpable bilateral. Capillary return is immediate. Temperature gradient is WNL. Skin turgor WNL  Sensorium: Normal Semmes Weinstein monofilament test. Normal tactile sensation bilaterally. Nail Exam: Pt has thick disfigured discolored nails with subungual debris noted bilateral entire nail hallux through fifth toenails Ulcer Exam: There is no evidence of ulcer or pre-ulcerative changes or infection. Orthopedic Exam: Muscle tone and strength are WNL. No limitations in general ROM. No crepitus or effusions noted. HAV  B/L.   Skin: No Porokeratosis. No infection or ulcers.   Callus sub 1 right. Corn 5th toe left foot. Diagnosis:  Onychomycosis, , Pain in right toe, pain in left toes,  Debride callus.  Treatment & Plan Procedures and Treatment: Consent by patient was obtained for treatment procedures.   Debridement of mycotic and hypertrophic toenails, 1 through 5 bilateral and clearing of subungual debris. No ulceration, no infection noted.  Return Visit-Office Procedure: Patient instructed to return to the office for a follow up visit 3 months for continued evaluation and treatment.    Gardiner Barefoot DPM

## 2018-06-21 ENCOUNTER — Telehealth: Payer: Self-pay | Admitting: Cardiology

## 2018-06-21 ENCOUNTER — Encounter: Payer: Self-pay | Admitting: Cardiology

## 2018-06-21 NOTE — Telephone Encounter (Signed)
Received records from [place] on 06/21/18, Appt 07/12/18 @ 9:00AM. [place]

## 2018-07-08 NOTE — Progress Notes (Signed)
HPI: Follow-up atrial fibrillation and congestive heart failure. Had fall 12/16. She had rib fractures and fracture of her right scapula. She was treated conservatively. She was noted to be in atrial fibrillation at that time. Echocardiogram revealed Ejection fraction 55-60%, mild aortic insufficiency and mild mitral regurgitation. She was treated with rate control and discharged. Reeadmitted 3/17 with CHF. Echocardiogram repeated and showed ejection fraction 35-40%, biatrial enlargement, moderate aortic insufficiency, severe mitral regurgitation and mild to moderate tricuspid regurgitation. Patienthas requested only conservative measures given her age which we thought was appropriate. She also has renal insufficiency. Since last seen,she denies dyspnea, chest pain, palpitations or syncope.  No bleeding.  No falls.  Current Outpatient Medications  Medication Sig Dispense Refill  . acetaminophen (TYLENOL) 500 MG tablet Take 1,000 mg by mouth every 6 (six) hours as needed for headache (pain).     [name] apixaban (ELIQUIS) 2.5 MG TABS tablet Take 1 tablet (2.5 mg total) by mouth 2 (two) times daily. 180 tablet 3  . carvedilol (COREG) 25 MG tablet Take 1 tablet (25 mg total) by mouth at bedtime. 90 tablet 1  . diphenhydramine-acetaminophen (TYLENOL PM) 25-500 MG TABS tablet Take 2 tablets by mouth at bedtime.     . ferrous sulfate 325 (65 FE) MG tablet Take 325 mg by mouth 2 (two) times daily with a meal.     . gabapentin (NEURONTIN) 300 MG capsule TAKE ONE TABLET THREE TIMES A DAY  1  . hydrALAZINE (APRESOLINE) 10 MG tablet Take 1 tablet (10 mg total) by mouth 3 (three) times daily. 270 tablet 1  . isosorbide mononitrate (IMDUR) 30 MG 24 hr tablet Take 30 mg by mouth daily.    [name] LORazepam (ATIVAN) 0.5 MG tablet Take 0.5 mg by mouth at bedtime.  0  . LUTEIN PO Take 25 mg by mouth daily.     [name] LYRICA 25 MG capsule Take 25 mg by mouth 2 (two) times daily as needed.  1  . OXYGEN Inhale 3 L into the  lungs See admin instructions. Use every night at bedtime and during the day if needed for shortness of breath    . polyethylene glycol (MIRALAX / GLYCOLAX) packet Take 17 g by mouth daily as needed (constipation).     . Propylene Glycol (SYSTANE COMPLETE) 0.6 % SOLN Place 1 drop into both eyes 2 (two) times daily as needed (dry eyes).    [name] Resveratrol 250 MG CAPS Take 250 mg by mouth daily.    . saxagliptin HCl (ONGLYZA) 2.5 MG TABS tablet Take 2.5 mg by mouth daily.     . sodium chloride (MURO 128) 5 % ophthalmic solution Place 1 drop into the left eye 4 (four) times daily.    [name] diltiazem (CARDIZEM CD) 120 MG 24 hr capsule Take 1 capsule (120 mg total) by mouth daily. 90 capsule 1  . torsemide (DEMADEX) 20 MG tablet Take 1 tablet (20 mg total) by mouth daily. 180 tablet 3   No current facility-administered medications for this visit.      Past Medical History:  Diagnosis Date  . Cancer [facility])    breast cancer  . Cardiomyopathy ([place])    a. 04/2015 Echo: EF 55-60%;  b. 07/2015 Echo: EF 35-40%, mod LVH, antsep DK, mod AI, sev MR, sev dil LA, mildly dil RA, mild-mod TR, PASP 15mmHg-->conservatively managed.  . Chronic systolic CHF (congestive heart failure) ([place])    a. 07/2015 Echo: EF 35-40%.  . CKD (chronic  kidney disease), stage IV ([place])   . Diabetes mellitus without complication ([place])   . Essential hypertension   . HLD (hyperlipidemia)   . Permanent atrial fibrillation    a. diagnosed 04/2015 --> conservative mgmt with rate control and xarelto.  . Pulsatile neck mass    a. 11/2015 Neck U/S: pulsatile R neck mass correlates w/ a toruous and mildy ectatic segment of the R SCA measuring ~ 2.1 cm in max diameter - no change since noted on 12/16 CT.  [name] Severe mitral regurgitation by prior echocardiogram    a. 07/2015 sev MR    Past Surgical History:  Procedure Laterality Date  . ABDOMINAL HYSTERECTOMY    . BREAST LUMPECTOMY    . FRACTURE SURGERY     L femur  . JOINT REPLACEMENT      knees    Social History   Socioeconomic History  . Marital status: Widowed    Spouse name: Not on file  . Number of children: Not on file  . Years of education: Not on file  . Highest education level: Not on file  Occupational History  . Not on file  Social Needs  . Financial resource strain: Not on file  . Food insecurity:    Worry: Not on file    Inability: Not on file  . Transportation needs:    Medical: Not on file    Non-medical: Not on file  Tobacco Use  . Smoking status: Never Smoker  . Smokeless tobacco: Never Used  Substance and Sexual Activity  . Alcohol use: No    Alcohol/week: 0.0 standard drinks  . Drug use: No  . Sexual activity: Never  Lifestyle  . Physical activity:    Days per week: Not on file    Minutes per session: Not on file  . Stress: Not on file  Relationships  . Social connections:    Talks on phone: Not on file    Gets together: Not on file    Attends religious service: Not on file    Active member of club or organization: Not on file    Attends meetings of clubs or organizations: Not on file    Relationship status: Not on file  . Intimate partner violence:    Fear of current or ex partner: Not on file    Emotionally abused: Not on file    Physically abused: Not on file    Forced sexual activity: Not on file  Other Topics Concern  . Not on file  Social History Narrative  . Not on file    Family History  Problem Relation Age of Onset  . Coronary artery disease Father   . Stroke Mother     ROS: no fevers or chills, productive cough, hemoptysis, dysphasia, odynophagia, melena, hematochezia, dysuria, hematuria, rash, seizure activity, orthopnea, PND, pedal edema, claudication. Remaining systems are negative.  Physical Exam: Well-developed frail in no acute distress.  Skin is warm and dry.  HEENT is normal.  Neck is supple.  Chest is clear to auscultation with normal expansion.  Cardiovascular exam is irregular, 2/6 systolic  murmur Abdominal exam nontender or distended. No masses palpated. Extremities show no edema. neuro grossly intact   A/P  1 chronic combined systolic/diastolic congestive heart failure-patient appears to be euvolemic on examination.  We will continue with present dose of Demadex.  Continue fluid restriction and low-sodium diet.  Patient had laboratories checked recently by nephrology.  BUN 65, creatinine 2.29 and potassium 4.5.  2 cardiomyopathy-she  declined further evaluation previously and does not want aggressive cardiac testing.  We will continue with hydralazine/nitrates and beta-blockade.  She is not on an ARB or Entresto given baseline renal insufficiency.  3 permanent atrial fibrillation-continue calcium and beta-blocker for rate control.  Continue apixaban.  Hemoglobin checked June 19, 2018 and was 11.7.  4 mitral regurgitation-we have elected not to pursue follow-up echocardiograms as patient is clear she would never be agreeable to aggressive interventions.  She wants only medical therapy.  5 chronic stage IV kidney disease-check potassium and renal function.  6 no CODE BLUE status.  Kirk Ruths, MD

## 2018-07-12 ENCOUNTER — Ambulatory Visit (INDEPENDENT_AMBULATORY_CARE_PROVIDER_SITE_OTHER): Payer: Medicare Other | Admitting: Cardiology

## 2018-07-12 ENCOUNTER — Other Ambulatory Visit: Payer: Self-pay | Admitting: *Deleted

## 2018-07-12 ENCOUNTER — Encounter: Payer: Self-pay | Admitting: Cardiology

## 2018-07-12 VITALS — BP 126/72 | HR 78 | Ht 60.0 in | Wt 132.0 lb

## 2018-07-12 DIAGNOSIS — I340 Nonrheumatic mitral (valve) insufficiency: Secondary | ICD-10-CM

## 2018-07-12 DIAGNOSIS — I5042 Chronic combined systolic (congestive) and diastolic (congestive) heart failure: Secondary | ICD-10-CM

## 2018-07-12 DIAGNOSIS — I4821 Permanent atrial fibrillation: Secondary | ICD-10-CM | POA: Diagnosis not present

## 2018-07-12 DIAGNOSIS — I420 Dilated cardiomyopathy: Secondary | ICD-10-CM

## 2018-07-12 MED ORDER — DILTIAZEM HCL ER COATED BEADS 120 MG PO CP24: 120.0000 mg | ORAL_CAPSULE | Freq: Every day | ORAL | 1 refills | Status: DC

## 2018-07-12 NOTE — Patient Instructions (Signed)
Medication Instructions:  NO CHANGE If you need a refill on your cardiac medications before your next appointment, please call your pharmacy.   Lab work: If you have labs (blood work) drawn today and your tests are completely normal, you will receive your results only by: . MyChart Message (if you have MyChart) OR . A paper copy in the mail If you have any lab test that is abnormal or we need to change your treatment, we will call you to review the results.  Follow-Up: At [facility], you and your health needs are our priority.  As part of our continuing mission to provide you with exceptional heart care, we have created designated Provider Care Teams.  These Care Teams include your primary Cardiologist (physician) and Advanced Practice Providers (APPs -  Physician Assistants and Nurse Practitioners) who all work together to provide you with the care you need, when you need it. You will need a follow up appointment in 6 months.  Please call our office 2 months in advance to schedule this appointment.  You may see BRIAN CRENSHAW MD or one of the following Advanced Practice Providers on your designated Care Team:   Luke Kilroy, PA-C Krista Kroeger, PA-C . Callie Goodrich, PA-C  CALL IN June TO SCHEDULE APPOINTMENT IN AUGUST   

## 2018-08-02 ENCOUNTER — Ambulatory Visit: Payer: Medicare Other | Admitting: Podiatry

## 2018-08-27 ENCOUNTER — Other Ambulatory Visit: Payer: Self-pay | Admitting: Cardiology

## 2018-08-27 MED ORDER — HYDRALAZINE HCL 10 MG PO TABS: 10.0000 mg | ORAL_TABLET | Freq: Three times a day (TID) | ORAL | 2 refills | Status: DC

## 2018-08-27 MED ORDER — APIXABAN 2.5 MG PO TABS: 2.5000 mg | ORAL_TABLET | Freq: Two times a day (BID) | ORAL | 3 refills | Status: DC

## 2018-08-27 MED ORDER — CARVEDILOL 25 MG PO TABS: 25.0000 mg | ORAL_TABLET | Freq: Every day | ORAL | 2 refills | Status: DC

## 2018-08-27 NOTE — Telephone Encounter (Signed)
Refilled Eliquis 2.5 mg Hydralazine 10mg  and carvedilol 25 mg to patients mail oreder

## 2018-08-27 NOTE — Telephone Encounter (Signed)
°*  STAT* If patient is at the pharmacy, call can be transferred to refill team.   1. Which medications need to be refilled? (please list name of each medication and dose if known) apixaban (ELIQUIS) 2.5 MG TABS tablet hydrALAZINE (APRESOLINE) 10 MG tablet carvedilol (COREG) 25 MG tablet   2. Which pharmacy/location (including street and city if local pharmacy) is medication to be sent to? [facility] - [place], [address]  3. Do they need a 30 day or 90 day supply? 90 days

## 2018-08-28 ENCOUNTER — Ambulatory Visit: Payer: Medicare Other | Admitting: Podiatry

## 2018-09-16 ENCOUNTER — Encounter: Payer: Self-pay | Admitting: Podiatry

## 2018-09-16 ENCOUNTER — Ambulatory Visit (INDEPENDENT_AMBULATORY_CARE_PROVIDER_SITE_OTHER): Payer: Medicare Other | Admitting: Podiatry

## 2018-09-16 ENCOUNTER — Other Ambulatory Visit: Payer: Self-pay

## 2018-09-16 VITALS — Temp 97.7°F

## 2018-09-16 DIAGNOSIS — E119 Type 2 diabetes mellitus without complications: Secondary | ICD-10-CM | POA: Diagnosis not present

## 2018-09-16 DIAGNOSIS — L84 Corns and callosities: Secondary | ICD-10-CM

## 2018-09-16 DIAGNOSIS — M79675 Pain in left toe(s): Secondary | ICD-10-CM

## 2018-09-16 DIAGNOSIS — M79674 Pain in right toe(s): Secondary | ICD-10-CM | POA: Diagnosis not present

## 2018-09-16 DIAGNOSIS — B351 Tinea unguium: Secondary | ICD-10-CM | POA: Diagnosis not present

## 2018-09-16 NOTE — Progress Notes (Signed)
Subjective: [name] is a 85 y.o. y.o. female who presents for preventative diabetic foot care on today. She is accompanied by her daughter on today's visit.  Daughter states Mom has been complaining of a sore right great toe.  She states she is wearing sandals on today's visit because her diabetic shoes' insoles are worn. She last received diabetic shoes a few years ago.  She remains on blood thinner, Eliquis.  Bernerd Limbo, MD is her PCP. Last visit was 07/16/2018.  Allergies  Allergen Reactions  . Codeine Other (See Comments)    "talks out of her head"   . Neurontin [Gabapentin] Swelling    Leg swelling  . Oxycodone Other (See Comments)    confusion     Objective:  Vascular Examination: Capillary refill time immediate x 10 digits.  Dorsalis pedis pulses palpable b/l.  Posterior tibial pulses palpable b/l.  Digital hair absent x 10 digits.  Skin temperature gradient WNL b/l.  Dermatological Examination: Skin with normal turgor, texture and tone b/l.  Toenails 1-5 b/l discolored, thick, dystrophic with subungual debris and pain with palpation to nailbeds due to thickness of nails.  Upon debridement of right great toe nailplate, yellow creamy foul smelling drainage was expressed subungually. Superficial nail bed ulceration discovered. No edema, no erythema, no digital warmth.  Hyperkeratotic lesion submet head 1 right foot, b/l hallux IPJ and dorsal PIPJ left 5th digit. No erythema, no edema, no drainage, no flocculence noted.   Musculoskeletal: Muscle strength 5/5 to all LE muscle groups  HAV with bunion b/l.  Hammertoe deformity left 5th digit.  Neurological: Sensation intact with 10 gram monofilament.  Vibratory sensation intact.  Assessment: 1.  Painful onychomycosis toenails 1-5 b/l 2.  Calluses b/l hallux, porokeratosis submet head 1 right 3.  Corn left 5th digit 4.  NIDDM  Plan: 1. Continue diabetic foot care principles. Literature  dispensed on today. 2. Toenails 1-5 b/l were debrided in length and girth without iatrogenic bleeding. 3. Right great toe nail plate debrided to level of adherence exposing superficial nail bed ulceration. Ulcer cleansed with wound cleanser. Triple antibiotic ointment and band-aid applied. Daughter instructed to apply Neosporin Cream to toe once daily for one week. 4. Hyperkeratotic lesion(s) pared b/l hallux,  Porokeratotic lesion submet head 1 right foot and left 5th digit pared with sterile scalpel blade. There was light bleeding left 5th digit which was addressed with Lumicain. Triple antibiotic ointment applied and daughter instructed to apply Neosporin Cream to left 5th digit once daily for one week. 5. Patient to continue soft, supportive shoe gear daily. Discussed diabetic shoe benefit for which she qualifies based on examination: NIDDM, porokeratosis submet head 1 right foot, HAV with bunion b/l, hammertoe 5th digit left foot. 6. Patient to report any pedal injuries to medical professional immediately. 7. Follow up 3 months.  8. Patient/POA to call should there be a concern in the interim.

## 2018-09-17 ENCOUNTER — Ambulatory Visit: Payer: Medicare Other | Admitting: Podiatry

## 2018-09-26 ENCOUNTER — Other Ambulatory Visit: Payer: Self-pay

## 2018-09-26 ENCOUNTER — Ambulatory Visit: Payer: Medicare Other | Admitting: Orthotics

## 2018-09-26 DIAGNOSIS — B351 Tinea unguium: Secondary | ICD-10-CM

## 2018-09-26 DIAGNOSIS — E119 Type 2 diabetes mellitus without complications: Secondary | ICD-10-CM

## 2018-09-26 DIAGNOSIS — M79674 Pain in right toe(s): Principal | ICD-10-CM

## 2018-09-26 DIAGNOSIS — M79675 Pain in left toe(s): Secondary | ICD-10-CM

## 2018-09-26 DIAGNOSIS — L84 Corns and callosities: Secondary | ICD-10-CM

## 2018-09-26 NOTE — Progress Notes (Signed)

## 2018-09-30 ENCOUNTER — Other Ambulatory Visit: Payer: Self-pay | Admitting: Cardiology

## 2018-09-30 MED ORDER — ISOSORBIDE MONONITRATE ER 30 MG PO TB24: 30.0000 mg | ORAL_TABLET | Freq: Every day | ORAL | 3 refills | Status: DC

## 2018-09-30 NOTE — Telephone Encounter (Signed)
New Message    *STAT* If patient is at the pharmacy, call can be transferred to refill team.   1. Which medications need to be refilled? (please list name of each medication and dose if known) Isosorbide   2. Which pharmacy/location (including street and city if local pharmacy) is medication to be sent to?[facility]   3. Do they need a 30 day or 90 day supply? 90 day supply

## 2018-09-30 NOTE — Telephone Encounter (Signed)
AWARE  E-ENT PRESCRIPTION TO [facility]# [id number]

## 2018-11-06 ENCOUNTER — Other Ambulatory Visit: Payer: Self-pay

## 2018-11-06 ENCOUNTER — Ambulatory Visit: Payer: Medicare Other | Admitting: Orthotics

## 2018-11-06 DIAGNOSIS — E1140 Type 2 diabetes mellitus with diabetic neuropathy, unspecified: Secondary | ICD-10-CM

## 2018-11-06 DIAGNOSIS — E119 Type 2 diabetes mellitus without complications: Secondary | ICD-10-CM

## 2018-11-06 DIAGNOSIS — L84 Corns and callosities: Secondary | ICD-10-CM

## 2018-11-06 NOTE — Progress Notes (Signed)
Reorder shoes 1/2 size smaller

## 2018-11-19 ENCOUNTER — Other Ambulatory Visit: Payer: Self-pay

## 2018-11-19 ENCOUNTER — Ambulatory Visit (INDEPENDENT_AMBULATORY_CARE_PROVIDER_SITE_OTHER): Payer: Self-pay | Admitting: Orthotics

## 2018-11-19 DIAGNOSIS — L84 Corns and callosities: Secondary | ICD-10-CM

## 2018-11-19 DIAGNOSIS — E119 Type 2 diabetes mellitus without complications: Secondary | ICD-10-CM

## 2018-11-19 DIAGNOSIS — M2012 Hallux valgus (acquired), left foot: Secondary | ICD-10-CM

## 2018-11-19 DIAGNOSIS — M2041 Other hammer toe(s) (acquired), right foot: Secondary | ICD-10-CM

## 2018-11-19 DIAGNOSIS — M2011 Hallux valgus (acquired), right foot: Secondary | ICD-10-CM

## 2018-11-19 DIAGNOSIS — E1140 Type 2 diabetes mellitus with diabetic neuropathy, unspecified: Secondary | ICD-10-CM

## 2018-11-19 NOTE — Progress Notes (Signed)

## 2018-11-20 ENCOUNTER — Inpatient Hospital Stay (HOSPITAL_COMMUNITY): Payer: Medicare Other

## 2018-11-20 ENCOUNTER — Other Ambulatory Visit: Payer: Self-pay

## 2018-11-20 ENCOUNTER — Inpatient Hospital Stay (HOSPITAL_COMMUNITY)
Admission: EM | Admit: 2018-11-20 | Discharge: 2018-11-24 | DRG: 481 | Disposition: A | Discharge status: Home Health | Payer: Medicare Other | Attending: Student in an Organized Health Care Education/Training Program | Admitting: Student in an Organized Health Care Education/Training Program

## 2018-11-20 ENCOUNTER — Emergency Department (HOSPITAL_COMMUNITY): Payer: Medicare Other

## 2018-11-20 ENCOUNTER — Encounter (HOSPITAL_COMMUNITY): Payer: Self-pay

## 2018-11-20 DIAGNOSIS — Z96653 Presence of artificial knee joint, bilateral: Secondary | ICD-10-CM | POA: Diagnosis present

## 2018-11-20 DIAGNOSIS — I5022 Chronic systolic (congestive) heart failure: Secondary | ICD-10-CM | POA: Diagnosis present

## 2018-11-20 DIAGNOSIS — Z419 Encounter for procedure for purposes other than remedying health state, unspecified: Secondary | ICD-10-CM

## 2018-11-20 DIAGNOSIS — N185 Chronic kidney disease, stage 5: Secondary | ICD-10-CM | POA: Diagnosis present

## 2018-11-20 DIAGNOSIS — D62 Acute posthemorrhagic anemia: Secondary | ICD-10-CM | POA: Diagnosis not present

## 2018-11-20 DIAGNOSIS — E1122 Type 2 diabetes mellitus with diabetic chronic kidney disease: Secondary | ICD-10-CM | POA: Diagnosis present

## 2018-11-20 DIAGNOSIS — J9611 Chronic respiratory failure with hypoxia: Secondary | ICD-10-CM | POA: Diagnosis present

## 2018-11-20 DIAGNOSIS — R29810 Facial weakness: Secondary | ICD-10-CM

## 2018-11-20 DIAGNOSIS — W19XXXA Unspecified fall, initial encounter: Secondary | ICD-10-CM

## 2018-11-20 DIAGNOSIS — Z8249 Family history of ischemic heart disease and other diseases of the circulatory system: Secondary | ICD-10-CM | POA: Diagnosis not present

## 2018-11-20 DIAGNOSIS — I4821 Permanent atrial fibrillation: Secondary | ICD-10-CM | POA: Diagnosis present

## 2018-11-20 DIAGNOSIS — Z66 Do not resuscitate: Secondary | ICD-10-CM | POA: Diagnosis present

## 2018-11-20 DIAGNOSIS — Z888 Allergy status to other drugs, medicaments and biological substances status: Secondary | ICD-10-CM | POA: Diagnosis not present

## 2018-11-20 DIAGNOSIS — W1789XA Other fall from one level to another, initial encounter: Secondary | ICD-10-CM | POA: Diagnosis present

## 2018-11-20 DIAGNOSIS — I4891 Unspecified atrial fibrillation: Secondary | ICD-10-CM | POA: Diagnosis not present

## 2018-11-20 DIAGNOSIS — M25552 Pain in left hip: Secondary | ICD-10-CM | POA: Diagnosis present

## 2018-11-20 DIAGNOSIS — S72142A Displaced intertrochanteric fracture of left femur, initial encounter for closed fracture: Secondary | ICD-10-CM | POA: Diagnosis present

## 2018-11-20 DIAGNOSIS — I429 Cardiomyopathy, unspecified: Secondary | ICD-10-CM | POA: Diagnosis present

## 2018-11-20 DIAGNOSIS — E119 Type 2 diabetes mellitus without complications: Secondary | ICD-10-CM

## 2018-11-20 DIAGNOSIS — S72002A Fracture of unspecified part of neck of left femur, initial encounter for closed fracture: Secondary | ICD-10-CM | POA: Diagnosis present

## 2018-11-20 DIAGNOSIS — I5032 Chronic diastolic (congestive) heart failure: Secondary | ICD-10-CM | POA: Diagnosis not present

## 2018-11-20 DIAGNOSIS — Z885 Allergy status to narcotic agent status: Secondary | ICD-10-CM

## 2018-11-20 DIAGNOSIS — M810 Age-related osteoporosis without current pathological fracture: Secondary | ICD-10-CM | POA: Diagnosis present

## 2018-11-20 DIAGNOSIS — Z9981 Dependence on supplemental oxygen: Secondary | ICD-10-CM

## 2018-11-20 DIAGNOSIS — E559 Vitamin D deficiency, unspecified: Secondary | ICD-10-CM | POA: Diagnosis present

## 2018-11-20 DIAGNOSIS — R918 Other nonspecific abnormal finding of lung field: Secondary | ICD-10-CM

## 2018-11-20 DIAGNOSIS — E785 Hyperlipidemia, unspecified: Secondary | ICD-10-CM | POA: Diagnosis present

## 2018-11-20 DIAGNOSIS — Z853 Personal history of malignant neoplasm of breast: Secondary | ICD-10-CM | POA: Diagnosis not present

## 2018-11-20 DIAGNOSIS — I10 Essential (primary) hypertension: Secondary | ICD-10-CM | POA: Diagnosis present

## 2018-11-20 DIAGNOSIS — Z1159 Encounter for screening for other viral diseases: Secondary | ICD-10-CM | POA: Diagnosis not present

## 2018-11-20 DIAGNOSIS — Z7901 Long term (current) use of anticoagulants: Secondary | ICD-10-CM | POA: Diagnosis not present

## 2018-11-20 DIAGNOSIS — I132 Hypertensive heart and chronic kidney disease with heart failure and with stage 5 chronic kidney disease, or end stage renal disease: Secondary | ICD-10-CM | POA: Diagnosis present

## 2018-11-20 DIAGNOSIS — Z79899 Other long term (current) drug therapy: Secondary | ICD-10-CM

## 2018-11-20 DIAGNOSIS — Z9889 Other specified postprocedural states: Secondary | ICD-10-CM | POA: Diagnosis not present

## 2018-11-20 DIAGNOSIS — I447 Left bundle-branch block, unspecified: Secondary | ICD-10-CM

## 2018-11-20 DIAGNOSIS — D500 Iron deficiency anemia secondary to blood loss (chronic): Secondary | ICD-10-CM | POA: Diagnosis not present

## 2018-11-20 DIAGNOSIS — T148XXA Other injury of unspecified body region, initial encounter: Secondary | ICD-10-CM

## 2018-11-20 HISTORY — DX: Fracture of unspecified part of neck of left femur, initial encounter for closed fracture: S72.002A

## 2018-11-20 LAB — CBC WITH DIFFERENTIAL/PLATELET
Abs Immature Granulocytes: 0.08 10*3/uL — ABNORMAL HIGH (ref 0.00–0.07)
Basophils Absolute: 0.1 10*3/uL (ref 0.0–0.1)
Basophils Relative: 1 %
Eosinophils Absolute: 0.6 10*3/uL — ABNORMAL HIGH (ref 0.0–0.5)
Eosinophils Relative: 6 %
HCT: 34.2 % — ABNORMAL LOW (ref 36.0–46.0)
Hemoglobin: 11.4 g/dL — ABNORMAL LOW (ref 12.0–15.0)
Immature Granulocytes: 1 %
Lymphocytes Relative: 7 %
Lymphs Abs: 0.7 10*3/uL (ref 0.7–4.0)
MCH: 30 pg (ref 26.0–34.0)
MCHC: 33.3 g/dL (ref 30.0–36.0)
MCV: 90 fL (ref 80.0–100.0)
Monocytes Absolute: 0.7 10*3/uL (ref 0.1–1.0)
Monocytes Relative: 7 %
Neutro Abs: 8.1 10*3/uL — ABNORMAL HIGH (ref 1.7–7.7)
Neutrophils Relative %: 78 %
Platelets: 221 10*3/uL (ref 150–400)
RBC: 3.8 MIL/uL — ABNORMAL LOW (ref 3.87–5.11)
RDW: 13.4 % (ref 11.5–15.5)
WBC: 10.2 10*3/uL (ref 4.0–10.5)
nRBC: 0 % (ref 0.0–0.2)

## 2018-11-20 LAB — COMPREHENSIVE METABOLIC PANEL
ALT: 16 U/L (ref 0–44)
AST: 20 U/L (ref 15–41)
Albumin: 3.5 g/dL (ref 3.5–5.0)
Alkaline Phosphatase: 56 U/L (ref 38–126)
Anion gap: 13 (ref 5–15)
BUN: 68 mg/dL — ABNORMAL HIGH (ref 8–23)
CO2: 21 mmol/L — ABNORMAL LOW (ref 22–32)
Calcium: 9.4 mg/dL (ref 8.9–10.3)
Chloride: 103 mmol/L (ref 98–111)
Creatinine, Ser: 2.75 mg/dL — ABNORMAL HIGH (ref 0.44–1.00)
GFR calc Af Amer: 17 mL/min — ABNORMAL LOW (ref >60)
GFR calc non Af Amer: 15 mL/min — ABNORMAL LOW (ref >60)
Glucose, Bld: 169 mg/dL — ABNORMAL HIGH (ref 70–99)
Potassium: 4.8 mmol/L (ref 3.5–5.1)
Sodium: 137 mmol/L (ref 135–145)
Total Bilirubin: 0.7 mg/dL (ref 0.3–1.2)
Total Protein: 6.5 g/dL (ref 6.5–8.1)

## 2018-11-20 LAB — GLUCOSE, CAPILLARY
Glucose-Capillary: 155 mg/dL — ABNORMAL HIGH (ref 70–99)
Glucose-Capillary: 221 mg/dL — ABNORMAL HIGH (ref 70–99)

## 2018-11-20 LAB — SARS CORONAVIRUS 2 BY RT PCR (HOSPITAL ORDER, PERFORMED IN ~~LOC~~ HOSPITAL LAB): SARS Coronavirus 2: NEGATIVE

## 2018-11-20 LAB — CBG MONITORING, ED
Glucose-Capillary: 154 mg/dL — ABNORMAL HIGH (ref 70–99)
Glucose-Capillary: 150 mg/dL — ABNORMAL HIGH (ref 70–99)

## 2018-11-20 LAB — SURGICAL PCR SCREEN
MRSA, PCR: NEGATIVE
Staphylococcus aureus: POSITIVE — AB

## 2018-11-20 MED ORDER — CEFAZOLIN SODIUM-DEXTROSE 2-4 GM/100ML-% IV SOLN
2.0000 g | INTRAVENOUS | Status: AC
  Administered 2018-11-21: 2 g via INTRAVENOUS

## 2018-11-20 MED ORDER — CARVEDILOL 25 MG PO TABS
25.0000 mg | ORAL_TABLET | Freq: Every day | ORAL | Status: DC
  Administered 2018-11-20 – 2018-11-23 (×5): 25 mg via ORAL
  Filled 2018-11-20 (×3): qty 1
  Filled 2018-11-20 (×2): qty 2

## 2018-11-20 MED ORDER — ONDANSETRON HCL 4 MG/2ML IJ SOLN: 4.0000 mg | Freq: Four times a day (QID) | INTRAMUSCULAR | Status: DC | PRN

## 2018-11-20 MED ORDER — DILTIAZEM HCL ER COATED BEADS 120 MG PO CP24
120.0000 mg | ORAL_CAPSULE | Freq: Every day | ORAL | Status: DC
  Administered 2018-11-20 – 2018-11-24 (×3): 120 mg via ORAL
  Filled 2018-11-20 (×4): qty 1

## 2018-11-20 MED ORDER — OXYCODONE HCL 5 MG PO TABS
5.0000 mg | ORAL_TABLET | ORAL | Status: DC | PRN
  Administered 2018-11-20 – 2018-11-24 (×10): 5 mg via ORAL
  Filled 2018-11-20 (×11): qty 1

## 2018-11-20 MED ORDER — LACTATED RINGERS IV SOLN
INTRAVENOUS | Status: DC
  Administered 2018-11-20: 08:00:00 via INTRAVENOUS

## 2018-11-20 MED ORDER — HYDROMORPHONE HCL 1 MG/ML IJ SOLN
0.5000 mg | Freq: Once | INTRAMUSCULAR | Status: AC
  Administered 2018-11-20: 0.5 mg via INTRAVENOUS
  Filled 2018-11-20: qty 1

## 2018-11-20 MED ORDER — ACETAMINOPHEN 650 MG RE SUPP: 650.0000 mg | Freq: Four times a day (QID) | RECTAL | Status: DC | PRN

## 2018-11-20 MED ORDER — POVIDONE-IODINE 10 % EX SWAB: 2.0000 "application " | Freq: Once | CUTANEOUS | Status: DC

## 2018-11-20 MED ORDER — HYDROMORPHONE HCL 1 MG/ML IJ SOLN
0.5000 mg | INTRAMUSCULAR | Status: DC | PRN
  Administered 2018-11-20 – 2018-11-21 (×2): 0.5 mg via INTRAVENOUS
  Filled 2018-11-20 (×2): qty 1

## 2018-11-20 MED ORDER — ENSURE PRE-SURGERY PO LIQD
296.0000 mL | Freq: Once | ORAL | Status: AC
  Administered 2018-11-21: 296 mL via ORAL
  Filled 2018-11-20: qty 296

## 2018-11-20 MED ORDER — HYDRALAZINE HCL 10 MG PO TABS
10.0000 mg | ORAL_TABLET | Freq: Three times a day (TID) | ORAL | Status: DC
  Administered 2018-11-20 – 2018-11-24 (×9): 10 mg via ORAL
  Filled 2018-11-20 (×13): qty 1

## 2018-11-20 MED ORDER — POLYVINYL ALCOHOL 1.4 % OP SOLN: 1.0000 [drp] | Freq: Two times a day (BID) | OPHTHALMIC | Status: DC | PRN

## 2018-11-20 MED ORDER — ACETAMINOPHEN 325 MG PO TABS
650.0000 mg | ORAL_TABLET | Freq: Four times a day (QID) | ORAL | Status: DC | PRN
  Administered 2018-11-20: 650 mg via ORAL
  Filled 2018-11-20: qty 2

## 2018-11-20 MED ORDER — FUROSEMIDE 40 MG PO TABS
40.0000 mg | ORAL_TABLET | Freq: Every day | ORAL | Status: DC
  Administered 2018-11-20 – 2018-11-24 (×4): 40 mg via ORAL
  Filled 2018-11-20 (×4): qty 1

## 2018-11-20 MED ORDER — MUPIROCIN 2 % EX OINT
1.0000 "application " | TOPICAL_OINTMENT | Freq: Two times a day (BID) | CUTANEOUS | Status: DC
  Administered 2018-11-20 – 2018-11-23 (×6): 1 via NASAL
  Filled 2018-11-20: qty 22

## 2018-11-20 MED ORDER — ONDANSETRON HCL 4 MG PO TABS
4.0000 mg | ORAL_TABLET | Freq: Four times a day (QID) | ORAL | Status: DC | PRN
  Administered 2018-11-20: 11:00:00 4 mg via ORAL
  Filled 2018-11-20: qty 1

## 2018-11-20 MED ORDER — INSULIN ASPART 100 UNIT/ML ~~LOC~~ SOLN
0.0000 [IU] | Freq: Three times a day (TID) | SUBCUTANEOUS | Status: DC
  Administered 2018-11-20: 2 [IU] via SUBCUTANEOUS
  Administered 2018-11-21: 5 [IU] via SUBCUTANEOUS
  Administered 2018-11-22: 2 [IU] via SUBCUTANEOUS
  Administered 2018-11-22: 12:00:00 3 [IU] via SUBCUTANEOUS
  Administered 2018-11-23: 2 [IU] via SUBCUTANEOUS
  Administered 2018-11-23 (×2): 1 [IU] via SUBCUTANEOUS

## 2018-11-20 MED ORDER — SENNA 8.6 MG PO TABS
1.0000 | ORAL_TABLET | Freq: Two times a day (BID) | ORAL | Status: DC
  Administered 2018-11-20 – 2018-11-24 (×8): 8.6 mg via ORAL
  Filled 2018-11-20 (×8): qty 1

## 2018-11-20 MED ORDER — ISOSORBIDE MONONITRATE ER 30 MG PO TB24
30.0000 mg | ORAL_TABLET | Freq: Every day | ORAL | Status: DC
  Administered 2018-11-20 – 2018-11-24 (×3): 30 mg via ORAL
  Filled 2018-11-20 (×4): qty 1

## 2018-11-20 MED ORDER — ONDANSETRON HCL 4 MG/2ML IJ SOLN
4.0000 mg | Freq: Once | INTRAMUSCULAR | Status: AC
  Administered 2018-11-20: 4 mg via INTRAVENOUS
  Filled 2018-11-20: qty 2

## 2018-11-20 MED ORDER — CHLORHEXIDINE GLUCONATE 4 % EX LIQD
60.0000 mL | Freq: Once | CUTANEOUS | Status: AC
  Administered 2018-11-20: 4 via TOPICAL
  Filled 2018-11-20: qty 60

## 2018-11-20 MED ORDER — LORAZEPAM 0.5 MG PO TABS: 0.5000 mg | ORAL_TABLET | Freq: Two times a day (BID) | ORAL | Status: DC | PRN

## 2018-11-20 NOTE — ED Notes (Signed)
ED TO INPATIENT HANDOFF REPORT  ED Nurse Name and Phone #: Ophelia Charter RN [phone]   S Name/Age/Gender [name] 85 y.o. female Room/Bed: 038C/038C  Code Status   Code Status: DNR  Home/SNF/Other Home Patient oriented to: self Is this baseline? Yes   Triage Complete: Triage complete  Chief Complaint Fall  Triage Note Pt fell this AM while attempting to do her daily weight.  Stepped off scale and lost her balance. Denies any dizziness or sx prior to fall.  No head ijury or LOC, denies any back pain.  Pain localized to L hip with some shortening noted and rotation.  Hx of femur surgery years ago.  Patient medicated with 250 Fentanyl IV per EMS and restful but drowsy on arrival.     Allergies Allergies  Allergen Reactions  . Codeine Other (See Comments)    "talks out of her head"   . Neurontin [Gabapentin] Swelling    Leg swelling  . Oxycodone Other (See Comments)    confusion     Level of Care/Admitting Diagnosis ED Disposition    ED Disposition Condition Comment   Admit  Hospital Area: [facility] [[id number]]  Level of Care: Telemetry Medical [104]  Covid Evaluation: Screening Protocol (No Symptoms)  Diagnosis: Closed left hip fracture, initial encounter [facility]) [[id number]]  Admitting Physician: Axel Filler [phone]  Attending Physician: Axel Filler [[id number]]  Estimated length of stay: past midnight tomorrow  Certification:: I certify this patient will need inpatient services for at least 2 midnights  PT Class (Do Not Modify): Inpatient [101]  PT Acc Code (Do Not Modify): Private [1]       B Medical/Surgery History Past Medical History:  Diagnosis Date  . Cancer [facility])    breast cancer  . Cardiomyopathy ([place])    a. 04/2015 Echo: EF 55-60%;  b. 07/2015 Echo: EF 35-40%, mod LVH, antsep DK, mod AI, sev MR, sev dil LA, mildly dil RA, mild-mod TR, PASP 29mmHg-->conservatively managed.  . Chronic systolic CHF (congestive heart  failure) ([place])    a. 07/2015 Echo: EF 35-40%.  . CKD (chronic kidney disease), stage IV ([place])   . Diabetes mellitus without complication ([place])   . Essential hypertension   . HLD (hyperlipidemia)   . Permanent atrial fibrillation    a. diagnosed 04/2015 --> conservative mgmt with rate control and xarelto.  . Pulsatile neck mass    a. 11/2015 Neck U/S: pulsatile R neck mass correlates w/ a toruous and mildy ectatic segment of the R SCA measuring ~ 2.1 cm in max diameter - no change since noted on 12/16 CT.  [name] Severe mitral regurgitation by prior echocardiogram    a. 07/2015 sev MR   Past Surgical History:  Procedure Laterality Date  . ABDOMINAL HYSTERECTOMY    . BREAST LUMPECTOMY    . FRACTURE SURGERY     L femur  . JOINT REPLACEMENT     knees     A IV Location/Drains/Wounds Patient Lines/Drains/Airways Status   Active Line/Drains/Airways    Name:   Placement date:   Placement time:   Site:   Days:   Peripheral IV 11/20/18 Anterior;Proximal;Right Forearm   11/20/18    0630    Forearm   less than 1          Intake/Output Last 24 hours No intake or output data in the 24 hours ending 11/20/18 1031  Labs/Imaging Results for orders placed or performed during the hospital encounter of 11/20/18 (from the  past 48 hour(s))  SARS Coronavirus 2 (CEPHEID - Performed in [place] hospital lab), Hosp Order     Status: None   Collection Time: 11/20/18  8:02 AM   Specimen: Nasopharyngeal Swab  Result Value Ref Range   SARS Coronavirus 2 NEGATIVE NEGATIVE    Comment: (NOTE) If result is NEGATIVE SARS-CoV-2 target nucleic acids are NOT DETECTED. The SARS-CoV-2 RNA is generally detectable in upper and lower  respiratory specimens during the acute phase of infection. The lowest  concentration of SARS-CoV-2 viral copies this assay can detect is 250  copies / mL. A negative result does not preclude SARS-CoV-2 infection  and should not be used as the sole basis for treatment or other   patient management decisions.  A negative result may occur with  improper specimen collection / handling, submission of specimen other  than nasopharyngeal swab, presence of viral mutation(s) within the  areas targeted by this assay, and inadequate number of viral copies  (<250 copies / mL). A negative result must be combined with clinical  observations, patient history, and epidemiological information. If result is POSITIVE SARS-CoV-2 target nucleic acids are DETECTED. The SARS-CoV-2 RNA is generally detectable in upper and lower  respiratory specimens dur ing the acute phase of infection.  Positive  results are indicative of active infection with SARS-CoV-2.  Clinical  correlation with patient history and other diagnostic information is  necessary to determine patient infection status.  Positive results do  not rule out bacterial infection or co-infection with other viruses. If result is PRESUMPTIVE POSTIVE SARS-CoV-2 nucleic acids MAY BE PRESENT.   A presumptive positive result was obtained on the submitted specimen  and confirmed on repeat testing.  While 2019 novel coronavirus  (SARS-CoV-2) nucleic acids may be present in the submitted sample  additional confirmatory testing may be necessary for epidemiological  and / or clinical management purposes  to differentiate between  SARS-CoV-2 and other Sarbecovirus currently known to infect humans.  If clinically indicated additional testing with an alternate test  methodology [phone]) is advised. The SARS-CoV-2 RNA is generally  detectable in upper and lower respiratory sp ecimens during the acute  phase of infection. The expected result is Negative. Fact Sheet for Patients:  StrictlyIdeas.no Fact Sheet for Healthcare Providers: BankingDealers.co.za This test is not yet approved or cleared by the [place] FDA and has been authorized for detection and/or diagnosis of SARS-CoV-2  by FDA under an Emergency Use Authorization (EUA).  This EUA will remain in effect (meaning this test can be used) for the duration of the COVID-19 declaration under Section 564(b)(1) of the Act, 21 U.S.C. section 360bbb-3(b)(1), unless the authorization is terminated or revoked sooner. Performed at [facility], [place] [address] [place], [place] [address]   CBC with Differential     Status: Abnormal   Collection Time: 11/20/18  8:11 AM  Result Value Ref Range   WBC 10.2 4.0 - 10.5 K/uL   RBC 3.80 (L) 3.87 - 5.11 MIL/uL   Hemoglobin 11.4 (L) 12.0 - 15.0 g/dL   HCT 34.2 (L) 36.0 - 46.0 %   MCV 90.0 80.0 - 100.0 fL   MCH 30.0 26.0 - 34.0 pg   MCHC 33.3 30.0 - 36.0 g/dL   RDW 13.4 11.5 - 15.5 %   Platelets 221 150 - 400 K/uL   nRBC 0.0 0.0 - 0.2 %   Neutrophils Relative % 78 %   Neutro Abs 8.1 (H) 1.7 - 7.7 K/uL   Lymphocytes  Relative 7 %   Lymphs Abs 0.7 0.7 - 4.0 K/uL   Monocytes Relative 7 %   Monocytes Absolute 0.7 0.1 - 1.0 K/uL   Eosinophils Relative 6 %   Eosinophils Absolute 0.6 (H) 0.0 - 0.5 K/uL   Basophils Relative 1 %   Basophils Absolute 0.1 0.0 - 0.1 K/uL   Immature Granulocytes 1 %   Abs Immature Granulocytes 0.08 (H) 0.00 - 0.07 K/uL    Comment: Performed at [facility] [address], [place], [place] [address]  Comprehensive metabolic panel     Status: Abnormal   Collection Time: 11/20/18  8:11 AM  Result Value Ref Range   Sodium 137 135 - 145 mmol/L   Potassium 4.8 3.5 - 5.1 mmol/L   Chloride 103 98 - 111 mmol/L   CO2 21 (L) 22 - 32 mmol/L   Glucose, Bld 169 (H) 70 - 99 mg/dL   BUN 68 (H) 8 - 23 mg/dL   Creatinine, Ser 2.75 (H) 0.44 - 1.00 mg/dL   Calcium 9.4 8.9 - 10.3 mg/dL   Total Protein 6.5 6.5 - 8.1 g/dL   Albumin 3.5 3.5 - 5.0 g/dL   AST 20 15 - 41 U/L   ALT 16 0 - 44 U/L   Alkaline Phosphatase 56 38 - 126 U/L   Total Bilirubin 0.7 0.3 - 1.2 mg/dL   GFR calc non Af Amer 15 (L) >60 mL/min   GFR calc Af Amer 17 (L) >60 mL/min    Anion gap 13 5 - 15    Comment: Performed at [facility] [address] [place], [place] [address]   Dg Chest 1 View  Result Date: 11/20/2018 CLINICAL DATA:  No chest comp,,pt fell today and having LT hip pain EXAM: CHEST  1 VIEW COMPARISON:  12/23/2017. FINDINGS: Interval resolution of the interstitial edema/infiltrate seen previously. Coarse interstitial and airspace opacities in the left apex, which were not evident on earlier film of 12/26/2015 coarse peripheral interstitial opacities particularly laterally at the right lung base. Heart size upper limits normal for technique. Aortic Atherosclerosis (ICD10-170.0). No effusion. Mild thoracic scoliosis with endplate spurring. DJD in bilateral shoulders. IMPRESSION: 1. Coarse interstitial and airspace opacities in the left apex, new since 12/26/2015. Electronically Signed   By: Lucrezia Europe M.D.   On: 11/20/2018 08:26   Dg Hip Unilat W Or Wo Pelvis 2-3 Views Left  Result Date: 11/20/2018 CLINICAL DATA:  Pain post fall EXAM: DG HIP (WITH OR WITHOUT PELVIS) 2-3V LEFT COMPARISON:  04/08/2006 FINDINGS: Comminuted left intertrochanteric femur fracture, mildly distracted with mild varus deformity. No dislocation. Bony pelvis appears intact. Diffuse osteopenia. Orthopedic hardware in the distal femoral shaft is partially visualized. Patchy bilateral femoral arterial calcifications. IMPRESSION: 1. Comminuted left intertrochanteric femur fracture. Electronically Signed   By: Lucrezia Europe M.D.   On: 11/20/2018 08:29    Pending Labs Unresulted Labs (From admission, onward)    Start     Ordered   11/21/18 2426  Basic metabolic panel  Tomorrow morning,   R     11/20/18 0949   11/21/18 0500  CBC  Tomorrow morning,   R     11/20/18 0949   11/20/18 0948  Magnesium  Add-on,   AD     11/20/18 0949          Vitals/Pain Today's Vitals   11/20/18 0730 11/20/18 0805 11/20/18 0928 11/20/18 0950  BP: (!) 167/83   (!) 188/83  Pulse: 70  90  Resp: 19   20   Temp:  (!) 97.4 F (36.3 C)    TempSrc:  Oral    SpO2: 94%   (!) 89%  PainSc:   2  5     Isolation Precautions No active isolations  Medications Medications  carvedilol (COREG) tablet 25 mg (has no administration in time range)  diltiazem (CARDIZEM CD) 24 hr capsule 120 mg (has no administration in time range)  hydrALAZINE (APRESOLINE) tablet 10 mg (has no administration in time range)  isosorbide mononitrate (IMDUR) 24 hr tablet 30 mg (has no administration in time range)  LORazepam (ATIVAN) tablet 0.5 mg (has no administration in time range)  Propylene Glycol 0.6 % SOLN 1 drop (has no administration in time range)  acetaminophen (TYLENOL) tablet 650 mg (has no administration in time range)    Or  acetaminophen (TYLENOL) suppository 650 mg (has no administration in time range)  oxyCODONE (Oxy IR/ROXICODONE) immediate release tablet 5 mg (has no administration in time range)  senna (SENOKOT) tablet 8.6 mg (has no administration in time range)  ondansetron (ZOFRAN) tablet 4 mg (has no administration in time range)    Or  ondansetron (ZOFRAN) injection 4 mg (has no administration in time range)  insulin aspart (novoLOG) injection 0-9 Units (has no administration in time range)  HYDROmorphone (DILAUDID) injection 0.5 mg (0.5 mg Intravenous Given 11/20/18 0816)  ondansetron (ZOFRAN) injection 4 mg (4 mg Intravenous Given 11/20/18 0948)    Mobility walks High fall risk   Focused Assessments Pulmonary Assessment Handoff:  Lung sounds:   O2 Device: Nasal Cannula O2 Flow Rate (L/min): 2 L/min      R Recommendations: See Admitting Provider Note  Report given to:   Additional Notes:  Patient fell this am while getting her daily weight, states she stepped off the scale and lost her balance.

## 2018-11-20 NOTE — ED Notes (Signed)
Patient transported to CT 

## 2018-11-20 NOTE — ED Provider Notes (Signed)
[facility] EMERGENCY DEPARTMENT Provider Note   CSN: [id number] Arrival date & time: 11/20/18  0938    History   Chief Complaint No chief complaint on file.   HPI [name] is a 85 y.o. female.     85 yo F with a chief complaint of a fall.  Patient had stepped up on her scale and when she was stepping off she lost her balance and fell onto her left buttock.  Complaining of pain to the left hip and was unable to ambulate afterwards.  She denies other injury denies head injury denies loss consciousness denies chest pain back pain abdominal pain.  Has a history of a left distal femur fracture that was surgically repaired.  She is on Eliquis for A. fib.  The history is provided by the patient and the EMS personnel.  Injury This is a new problem. The current episode started less than 1 hour ago. The problem occurs constantly. The problem has not changed since onset.Pertinent negatives include no chest pain, no abdominal pain, no headaches and no shortness of breath. The symptoms are aggravated by bending, twisting and walking. Nothing relieves the symptoms. She has tried nothing for the symptoms. The treatment provided no relief.    Past Medical History:  Diagnosis Date   Cancer ([place])    breast cancer   Cardiomyopathy ([place])    a. 04/2015 Echo: EF 55-60%;  b. 07/2015 Echo: EF 35-40%, mod LVH, antsep DK, mod AI, sev MR, sev dil LA, mildly dil RA, mild-mod TR, PASP 68mmHg-->conservatively managed.   Chronic systolic CHF (congestive heart failure) ([place])    a. 07/2015 Echo: EF 35-40%.   CKD (chronic kidney disease), stage IV (HCC)    Diabetes mellitus without complication ([place])    Essential hypertension    HLD (hyperlipidemia)    Permanent atrial fibrillation    a. diagnosed 04/2015 --> conservative mgmt with rate control and xarelto.   Pulsatile neck mass    a. 11/2015 Neck U/S: pulsatile R neck mass correlates w/ a toruous and mildy ectatic segment of the  R SCA measuring ~ 2.1 cm in max diameter - no change since noted on 12/16 CT.   Severe mitral regurgitation by prior echocardiogram    a. 07/2015 sev MR    Patient Active Problem List   Diagnosis Date Noted   Closed left hip fracture, initial encounter ([place]) 11/20/2018   Acute and chronic respiratory failure (acute-on-chronic) ([place]) 12/23/2017   CKD (chronic kidney disease), stage V ([place]) 12/23/2017   Essential hypertension    HLD (hyperlipidemia)    PAF (paroxysmal atrial fibrillation) ([place])    Cardiomyopathy ([place])    Mitral regurgitation 08/24/2015   Congestive dilated cardiomyopathy ([place]) 08/24/2015   Congestive heart failure (CHF) ([place]) 08/24/2015   Acute on chronic congestive heart failure ([place])    Dyspnea 08/02/2015   AKI (acute kidney injury) ([place]) 05/10/2015    Rib fractures - right #8-10 05/09/2015   Atrial fibrillation ([place]) 05/09/2015   Scapula fracture 05/09/2015   Fall 05/09/2015   Essential hypertension, benign 05/09/2015   Diabetes mellitus without complication ([place]) 18/29/9371   History of breast cancer 05/09/2015    Past Surgical History:  Procedure Laterality Date   ABDOMINAL HYSTERECTOMY     BREAST LUMPECTOMY     FRACTURE SURGERY     L femur   JOINT REPLACEMENT     knees     OB History   No obstetric history on file.  Home Medications    Prior to Admission medications   Medication Sig Start Date End Date Taking? Authorizing Provider  acetaminophen (TYLENOL) 500 MG tablet Take 1,000 mg by mouth every 6 (six) hours as needed for headache (pain).    Yes [provider]  apixaban (ELIQUIS) 2.5 MG TABS tablet Take 1 tablet (2.5 mg total) by mouth 2 (two) times daily. 08/27/18  Yes Lelon Perla, MD  carvedilol (COREG) 25 MG tablet Take 1 tablet (25 mg total) by mouth at bedtime. 08/27/18  Yes Lelon Perla, MD  diltiazem (CARDIZEM CD) 120 MG 24 hr capsule Take 1 capsule (120 mg total) by mouth daily. 07/12/18   Yes Lelon Perla, MD  ferrous sulfate 325 (65 FE) MG tablet Take 325 mg by mouth 2 (two) times daily with a meal.    Yes [provider]  hydrALAZINE (APRESOLINE) 10 MG tablet Take 1 tablet (10 mg total) by mouth 3 (three) times daily. 08/27/18  Yes Lelon Perla, MD  isosorbide mononitrate (IMDUR) 30 MG 24 hr tablet Take 1 tablet (30 mg total) by mouth daily. 09/30/18  Yes Lelon Perla, MD  LORazepam (ATIVAN) 0.5 MG tablet Take 0.25 mg by mouth at bedtime.  01/01/18  Yes [provider]  LUTEIN PO Take 25 mg by mouth daily.    Yes [provider]  LYRICA 25 MG capsule Take 25 mg by mouth every evening.  12/28/17  Yes [provider]  OXYGEN Inhale 3 L into the lungs See admin instructions. Use every night at bedtime and during the day if needed for shortness of breath   Yes [provider]  polyethylene glycol (MIRALAX / GLYCOLAX) packet Take 17 g by mouth daily.  05/13/15  Yes [provider]  Resveratrol 250 MG CAPS Take 250 mg by mouth daily.   Yes [provider]  saxagliptin HCl (ONGLYZA) 2.5 MG TABS tablet Take 2.5 mg by mouth daily.  06/05/16  Yes [provider]  sodium chloride (MURO 128) 5 % ophthalmic solution Place 1 drop into the left eye 4 (four) times daily.   Yes [provider]  torsemide (DEMADEX) 20 MG tablet Take 1 tablet (20 mg total) by mouth daily. 03/13/18 11/20/18 Yes Lelon Perla, MD    Family History Family History  Problem Relation Age of Onset   Coronary artery disease Father    Stroke Mother     Social History Social History   Tobacco Use   Smoking status: Never Smoker   Smokeless tobacco: Never Used  Substance Use Topics   Alcohol use: No    Alcohol/week: 0.0 standard drinks   Drug use: Yes     Allergies   Codeine, Neurontin [gabapentin], and Oxycodone   Review of Systems Review of Systems  Constitutional: Negative for chills and fever.  HENT:  Negative for congestion and rhinorrhea.   Eyes: Negative for redness and visual disturbance.  Respiratory: Negative for shortness of breath and wheezing.   Cardiovascular: Negative for chest pain and palpitations.  Gastrointestinal: Negative for abdominal pain, nausea and vomiting.  Genitourinary: Negative for dysuria and urgency.  Musculoskeletal: Positive for arthralgias. Negative for myalgias.  Skin: Negative for pallor and wound.  Neurological: Negative for dizziness and headaches.     Physical Exam Updated Vital Signs BP (!) 156/93    Pulse (!) 104    Temp 97.7 F (36.5 C) (Oral)    Resp 16    SpO2 90%  Physical Exam Vitals signs and nursing note reviewed.  Constitutional:      General: She is not in acute distress.    Appearance: She is well-developed. She is not diaphoretic.  HENT:     Head: Normocephalic and atraumatic.  Eyes:     Pupils: Pupils are equal, round, and reactive to light.  Neck:     Musculoskeletal: Normal range of motion and neck supple.  Cardiovascular:     Rate and Rhythm: Normal rate and regular rhythm.     Heart sounds: No murmur. No friction rub. No gallop.   Pulmonary:     Effort: Pulmonary effort is normal.     Breath sounds: No wheezing or rales.  Abdominal:     General: There is no distension.     Palpations: Abdomen is soft.     Tenderness: There is no abdominal tenderness.  Musculoskeletal:        General: Tenderness present.     Comments: Shortening and external rotation of the left lower extremity.  Pulse motor and sensation are intact distally.  Palpated from head to toe without any other noted areas of tenderness.  No other obvious source of trauma.  Skin:    General: Skin is warm and dry.  Neurological:     Mental Status: She is alert and oriented to person, place, and time.  Psychiatric:        Behavior: Behavior normal.      ED Treatments / Results  Labs (all labs ordered are listed, but only abnormal results are  displayed) Labs Reviewed  CBC WITH DIFFERENTIAL/PLATELET - Abnormal; Notable for the following components:      Result Value   RBC 3.80 (*)    Hemoglobin 11.4 (*)    HCT 34.2 (*)    Neutro Abs 8.1 (*)    Eosinophils Absolute 0.6 (*)    Abs Immature Granulocytes 0.08 (*)    All other components within normal limits  COMPREHENSIVE METABOLIC PANEL - Abnormal; Notable for the following components:   CO2 21 (*)    Glucose, Bld 169 (*)    BUN 68 (*)    Creatinine, Ser 2.75 (*)    GFR calc non Af Amer 15 (*)    GFR calc Af Amer 17 (*)    All other components within normal limits  CBG MONITORING, ED - Abnormal; Notable for the following components:   Glucose-Capillary 154 (*)    All other components within normal limits  CBG MONITORING, ED - Abnormal; Notable for the following components:   Glucose-Capillary 150 (*)    All other components within normal limits  SARS CORONAVIRUS 2 (HOSPITAL ORDER, [place] [facility])    EKG EKG Interpretation  Date/Time:  Wednesday November 20 2018 07:21:08 EDT Ventricular Rate:  61 PR Interval:    QRS Duration: 150 QT Interval:  414 QTC Calculation: 417 R Axis:   -47 Text Interpretation:  Atrial fibrillation Left bundle branch block No significant change since last tracing Confirmed by Deno Etienne [phone]) on 11/20/2018 7:30:19 AM   Radiology Dg Chest 1 View  Result Date: 11/20/2018 CLINICAL DATA:  No chest comp,,pt fell today and having LT hip pain EXAM: CHEST  1 VIEW COMPARISON:  12/23/2017. FINDINGS: Interval resolution of the interstitial edema/infiltrate seen previously. Coarse interstitial and airspace opacities in the left apex, which were not evident on earlier film of 12/26/2015 coarse peripheral interstitial opacities particularly laterally at the right lung base. Heart size upper limits normal  for technique. Aortic Atherosclerosis (ICD10-170.0). No effusion. Mild thoracic scoliosis with endplate spurring. DJD in bilateral  shoulders. IMPRESSION: 1. Coarse interstitial and airspace opacities in the left apex, new since 12/26/2015. Electronically Signed   By: Lucrezia Europe M.D.   On: 11/20/2018 08:26   Ct Head Wo Contrast  Result Date: 11/20/2018 CLINICAL DATA:  85 year old female with history of minor head trauma. EXAM: CT HEAD WITHOUT CONTRAST TECHNIQUE: Contiguous axial images were obtained from the base of the skull through the vertex without intravenous contrast. COMPARISON:  Head CT 05/09/2015. FINDINGS: Brain: Mild cerebral atrophy. Patchy and confluent areas of decreased attenuation are noted throughout the deep and periventricular white matter of the cerebral hemispheres bilaterally, compatible with chronic microvascular ischemic disease. No evidence of acute infarction, hemorrhage, hydrocephalus, extra-axial collection or mass lesion/mass effect. Vascular: No hyperdense vessel or unexpected calcification. Skull: Normal. Negative for fracture or focal lesion. Sinuses/Orbits: Complete opacification of the left maxillary sinus and right sphenoid sinus where there is also extensive mucoperiosteal thickening, compatible with chronic sinusitis. Multifocal opacification of the left ethmoid sinuses also noted. No air-fluid levels. Orbits are unremarkable in appearance. Other: None. IMPRESSION: 1. No acute displaced skull fractures or findings to suggest significant acute intracranial trauma. 2. Mild cerebral atrophy. 3. Chronic microvascular ischemic changes in the cerebral white matter, as above. Electronically Signed   By: Vinnie Langton M.D.   On: 11/20/2018 10:50   Dg Hip Unilat W Or Wo Pelvis 2-3 Views Left  Result Date: 11/20/2018 CLINICAL DATA:  Pain post fall EXAM: DG HIP (WITH OR WITHOUT PELVIS) 2-3V LEFT COMPARISON:  04/08/2006 FINDINGS: Comminuted left intertrochanteric femur fracture, mildly distracted with mild varus deformity. No dislocation. Bony pelvis appears intact. Diffuse osteopenia. Orthopedic hardware in  the distal femoral shaft is partially visualized. Patchy bilateral femoral arterial calcifications. IMPRESSION: 1. Comminuted left intertrochanteric femur fracture. Electronically Signed   By: Lucrezia Europe M.D.   On: 11/20/2018 08:29    Procedures Procedures (including critical care time)  Medications Ordered in ED Medications  feeding supplement (ENSURE PRE-SURGERY) liquid 296 mL (has no administration in time range)  chlorhexidine (HIBICLENS) 4 % liquid 4 application (has no administration in time range)  povidone-iodine 10 % swab 2 application (has no administration in time range)  ceFAZolin (ANCEF) IVPB 2g/100 mL premix (has no administration in time range)  carvedilol (COREG) tablet 25 mg (has no administration in time range)  diltiazem (CARDIZEM CD) 24 hr capsule 120 mg (has no administration in time range)  hydrALAZINE (APRESOLINE) tablet 10 mg (has no administration in time range)  isosorbide mononitrate (IMDUR) 24 hr tablet 30 mg (has no administration in time range)  LORazepam (ATIVAN) tablet 0.5 mg (has no administration in time range)  polyvinyl alcohol (LIQUIFILM TEARS) 1.4 % ophthalmic solution 1 drop (has no administration in time range)  acetaminophen (TYLENOL) tablet 650 mg (has no administration in time range)    Or  acetaminophen (TYLENOL) suppository 650 mg (has no administration in time range)  oxyCODONE (Oxy IR/ROXICODONE) immediate release tablet 5 mg (5 mg Oral Given 11/20/18 1124)  senna (SENOKOT) tablet 8.6 mg (has no administration in time range)  ondansetron (ZOFRAN) tablet 4 mg (4 mg Oral Given 11/20/18 1124)    Or  ondansetron (ZOFRAN) injection 4 mg ( Intravenous See Alternative 11/20/18 1124)  insulin aspart (novoLOG) injection 0-9 Units (has no administration in time range)  furosemide (LASIX) tablet 40 mg (has no administration in time range)  HYDROmorphone (DILAUDID) injection 0.5 mg (0.5  mg Intravenous Given 11/20/18 0816)  ondansetron (ZOFRAN) injection 4  mg (4 mg Intravenous Given 11/20/18 0948)     Initial Impression / Assessment and Plan / ED Course  I have reviewed the triage vital signs and the nursing notes.  Pertinent labs & imaging results that were available during my care of the patient were reviewed by me and considered in my medical decision making (see chart for details).        85 yo F with a chief complaints of a fall.  Patient lost her balance of being off of a scale.  Likely has a left hip fracture.  Will obtain chest x-ray x-ray of the hip lab work.  Was given 250 mcg of fentanyl in route with significant improvement of her pain.  Plain film viewed by me with a displaced left intertrochanteric hip fracture.  Will discuss with Ortho.  Discussed with Orion Crook will come and evaluate the patient at bedside.  Lab work is returned and the patient's renal function is at baseline her hemoglobin is at baseline.  No leukocytosis.  Will discuss with medicine for admission.  The patients results and plan were reviewed and discussed.   Any x-rays performed were independently reviewed by myself.   Differential diagnosis were considered with the presenting HPI.  Medications  feeding supplement (ENSURE PRE-SURGERY) liquid 296 mL (has no administration in time range)  chlorhexidine (HIBICLENS) 4 % liquid 4 application (has no administration in time range)  povidone-iodine 10 % swab 2 application (has no administration in time range)  ceFAZolin (ANCEF) IVPB 2g/100 mL premix (has no administration in time range)  carvedilol (COREG) tablet 25 mg (has no administration in time range)  diltiazem (CARDIZEM CD) 24 hr capsule 120 mg (has no administration in time range)  hydrALAZINE (APRESOLINE) tablet 10 mg (has no administration in time range)  isosorbide mononitrate (IMDUR) 24 hr tablet 30 mg (has no administration in time range)  LORazepam (ATIVAN) tablet 0.5 mg (has no administration in time range)  polyvinyl alcohol (LIQUIFILM  TEARS) 1.4 % ophthalmic solution 1 drop (has no administration in time range)  acetaminophen (TYLENOL) tablet 650 mg (has no administration in time range)    Or  acetaminophen (TYLENOL) suppository 650 mg (has no administration in time range)  oxyCODONE (Oxy IR/ROXICODONE) immediate release tablet 5 mg (5 mg Oral Given 11/20/18 1124)  senna (SENOKOT) tablet 8.6 mg (has no administration in time range)  ondansetron (ZOFRAN) tablet 4 mg (4 mg Oral Given 11/20/18 1124)    Or  ondansetron (ZOFRAN) injection 4 mg ( Intravenous See Alternative 11/20/18 1124)  insulin aspart (novoLOG) injection 0-9 Units (has no administration in time range)  furosemide (LASIX) tablet 40 mg (has no administration in time range)  HYDROmorphone (DILAUDID) injection 0.5 mg (0.5 mg Intravenous Given 11/20/18 0816)  ondansetron (ZOFRAN) injection 4 mg (4 mg Intravenous Given 11/20/18 0948)    Vitals:   11/20/18 1415 11/20/18 1430 11/20/18 1445 11/20/18 1519  BP: (!) 170/89 (!) 157/83 (!) 168/74 (!) 156/93  Pulse: (!) 116 94 (!) 103 (!) 104  Resp: (!) 22 15 (!) 23 16  Temp:    97.7 F (36.5 C)  TempSrc:    Oral  SpO2: 96% 95% 97% 90%    Final diagnoses:  Closed displaced intertrochanteric fracture of left femur, initial encounter ([place])    Admission/ observation were discussed with the admitting physician, patient and/or family and they are comfortable with the plan.    Final Clinical Impressions(s) /  ED Diagnoses   Final diagnoses:  Closed displaced intertrochanteric fracture of left femur, initial encounter [facility])    ED Discharge Orders    None       Deno [facility], DO 11/20/18 1533

## 2018-11-20 NOTE — ED Notes (Signed)
Pt having runs of bigeminy and states she "feels like passing out".  Reports pain improved, MD notified.

## 2018-11-20 NOTE — ED Notes (Signed)
Back from CT

## 2018-11-20 NOTE — Consult Note (Signed)
Reason for Consult:Left hip fx Referring Physician: D [name] is an 85 y.o. female.  HPI: [name] was stepping off a scale at home and lost her balance and fell. She had immediate left hip pain and could not get up. She was brought to the ED where x-rays showed a left hip fx and orthopedic surgery was consulted. She c/o localized pain to the area only. She lives with her granddaughter and her kids.  Past Medical History:  Diagnosis Date  . Cancer [facility])    breast cancer  . Cardiomyopathy ([place])    a. 04/2015 Echo: EF 55-60%;  b. 07/2015 Echo: EF 35-40%, mod LVH, antsep DK, mod AI, sev MR, sev dil LA, mildly dil RA, mild-mod TR, PASP 66mmHg-->conservatively managed.  . Chronic systolic CHF (congestive heart failure) ([place])    a. 07/2015 Echo: EF 35-40%.  . CKD (chronic kidney disease), stage IV ([place])   . Diabetes mellitus without complication ([place])   . Essential hypertension   . HLD (hyperlipidemia)   . Permanent atrial fibrillation    a. diagnosed 04/2015 --> conservative mgmt with rate control and xarelto.  . Pulsatile neck mass    a. 11/2015 Neck U/S: pulsatile R neck mass correlates w/ a toruous and mildy ectatic segment of the R SCA measuring ~ 2.1 cm in max diameter - no change since noted on 12/16 CT.  [name] Severe mitral regurgitation by prior echocardiogram    a. 07/2015 sev MR    Past Surgical History:  Procedure Laterality Date  . ABDOMINAL HYSTERECTOMY    . BREAST LUMPECTOMY    . FRACTURE SURGERY     L femur  . JOINT REPLACEMENT     knees    Family History  Problem Relation Age of Onset  . Coronary artery disease Father   . Stroke Mother     Social History:  reports that she has never smoked. She has never used smokeless tobacco. She reports that she does not drink alcohol or use drugs.  Allergies:  Allergies  Allergen Reactions  . Codeine Other (See Comments)    "talks out of her head"   . Neurontin [Gabapentin] Swelling    Leg swelling  . Oxycodone  Other (See Comments)    confusion     Medications: I have reviewed the patient's current medications.  Results for orders placed or performed during the hospital encounter of 11/20/18 (from the past 48 hour(s))  CBC with Differential     Status: Abnormal   Collection Time: 11/20/18  8:11 AM  Result Value Ref Range   WBC 10.2 4.0 - 10.5 K/uL   RBC 3.80 (L) 3.87 - 5.11 MIL/uL   Hemoglobin 11.4 (L) 12.0 - 15.0 g/dL   HCT 34.2 (L) 36.0 - 46.0 %   MCV 90.0 80.0 - 100.0 fL   MCH 30.0 26.0 - 34.0 pg   MCHC 33.3 30.0 - 36.0 g/dL   RDW 13.4 11.5 - 15.5 %   Platelets 221 150 - 400 K/uL   nRBC 0.0 0.0 - 0.2 %   Neutrophils Relative % 78 %   Neutro Abs 8.1 (H) 1.7 - 7.7 K/uL   Lymphocytes Relative 7 %   Lymphs Abs 0.7 0.7 - 4.0 K/uL   Monocytes Relative 7 %   Monocytes Absolute 0.7 0.1 - 1.0 K/uL   Eosinophils Relative 6 %   Eosinophils Absolute 0.6 (H) 0.0 - 0.5 K/uL   Basophils Relative 1 %   Basophils Absolute 0.1 0.0 -  0.1 K/uL   Immature Granulocytes 1 %   Abs Immature Granulocytes 0.08 (H) 0.00 - 0.07 K/uL    Comment: Performed at [facility], [place] [address] [place], [place] [address]  Comprehensive metabolic panel     Status: Abnormal   Collection Time: 11/20/18  8:11 AM  Result Value Ref Range   Sodium 137 135 - 145 mmol/L   Potassium 4.8 3.5 - 5.1 mmol/L   Chloride 103 98 - 111 mmol/L   CO2 21 (L) 22 - 32 mmol/L   Glucose, Bld 169 (H) 70 - 99 mg/dL   BUN 68 (H) 8 - 23 mg/dL   Creatinine, Ser 2.75 (H) 0.44 - 1.00 mg/dL   Calcium 9.4 8.9 - 10.3 mg/dL   Total Protein 6.5 6.5 - 8.1 g/dL   Albumin 3.5 3.5 - 5.0 g/dL   AST 20 15 - 41 U/L   ALT 16 0 - 44 U/L   Alkaline Phosphatase 56 38 - 126 U/L   Total Bilirubin 0.7 0.3 - 1.2 mg/dL   GFR calc non Af Amer 15 (L) >60 mL/min   GFR calc Af Amer 17 (L) >60 mL/min   Anion gap 13 5 - 15    Comment: Performed at [facility] [address] [place], [place] [address]    Dg Chest 1 View  Result Date:  11/20/2018 CLINICAL DATA:  No chest comp,,pt fell today and having LT hip pain EXAM: CHEST  1 VIEW COMPARISON:  12/23/2017. FINDINGS: Interval resolution of the interstitial edema/infiltrate seen previously. Coarse interstitial and airspace opacities in the left apex, which were not evident on earlier film of 12/26/2015 coarse peripheral interstitial opacities particularly laterally at the right lung base. Heart size upper limits normal for technique. Aortic Atherosclerosis (ICD10-170.0). No effusion. Mild thoracic scoliosis with endplate spurring. DJD in bilateral shoulders. IMPRESSION: 1. Coarse interstitial and airspace opacities in the left apex, new since 12/26/2015. Electronically Signed   By: Lucrezia Europe M.D.   On: 11/20/2018 08:26   Dg Hip Unilat W Or Wo Pelvis 2-3 Views Left  Result Date: 11/20/2018 CLINICAL DATA:  Pain post fall EXAM: DG HIP (WITH OR WITHOUT PELVIS) 2-3V LEFT COMPARISON:  04/08/2006 FINDINGS: Comminuted left intertrochanteric femur fracture, mildly distracted with mild varus deformity. No dislocation. Bony pelvis appears intact. Diffuse osteopenia. Orthopedic hardware in the distal femoral shaft is partially visualized. Patchy bilateral femoral arterial calcifications. IMPRESSION: 1. Comminuted left intertrochanteric femur fracture. Electronically Signed   By: Lucrezia Europe M.D.   On: 11/20/2018 08:29    Review of Systems  Constitutional: Negative for weight loss.  HENT: Negative for ear discharge, ear pain, hearing loss and tinnitus.   Eyes: Negative for blurred vision, double vision, photophobia and pain.  Respiratory: Negative for cough, sputum production and shortness of breath.   Cardiovascular: Negative for chest pain.  Gastrointestinal: Negative for abdominal pain, nausea and vomiting.  Genitourinary: Negative for dysuria, flank pain, frequency and urgency.  Musculoskeletal: Positive for joint pain (Left hip). Negative for back pain, falls, myalgias and neck pain.   Neurological: Negative for dizziness, tingling, sensory change, focal weakness, loss of consciousness and headaches.  Endo/Heme/Allergies: Does not bruise/bleed easily.  Psychiatric/Behavioral: Negative for depression, memory loss and substance abuse. [name] not nervous/anxious.    Blood pressure (!) 167/83, pulse 70, temperature (!) 97.4 F (36.3 C), temperature source Oral, resp. rate 19, SpO2 94 %. Physical Exam  Constitutional: She appears well-developed and well-nourished. No distress.  HENT:  Head: Normocephalic and atraumatic.  Eyes: Conjunctivae are normal. Right eye exhibits no discharge. Left eye exhibits no discharge. No scleral icterus.  Neck: Normal range of motion.  Cardiovascular: Normal rate and regular rhythm.  Respiratory: Effort normal. No respiratory distress.  Musculoskeletal:     Comments: LLE No traumatic wounds, ecchymosis, or rash  TTP hip  No knee or ankle effusion  Knee stable to varus/ valgus and anterior/posterior stress  Sens DPN, SPN, TN intact  Motor EHL, ext, flex, evers 5/5  DP 2+, PT 2+, No significant edema  Neurological: She is alert.  Skin: Skin is warm and dry. She is not diaphoretic.  Psychiatric: She has a normal mood and affect. Her behavior is normal.    Assessment/Plan: Left hip fx -- Complicated by a previous distal femur replacement. Will plan on ORIF with [facility] tomorrow by either Dr. Doreatha Martin or [facility]. Please keep NPO after MN. Multiple medical problems including CHF, DM, HTN, and PAF on Eliquis -- IM to admit, clear for surgery. Appreciate their help. Please hold Eliquis until after surgery.    Lisette Abu, PA-C Orthopedic Surgery [phone] 11/20/2018, 9:32 AM

## 2018-11-20 NOTE — ED Notes (Signed)
Daughter number [phone]

## 2018-11-20 NOTE — ED Triage Notes (Signed)
Pt fell this AM while attempting to do her daily weight.  Stepped off scale and lost her balance. Denies any dizziness or sx prior to fall.  No head ijury or LOC, denies any back pain.  Pain localized to L hip with some shortening noted and rotation.  Hx of femur surgery years ago.  Patient medicated with 250 Fentanyl IV per EMS and restful but drowsy on arrival.

## 2018-11-20 NOTE — ED Notes (Signed)
Granddaughter- [name] [phone]

## 2018-11-20 NOTE — ED Notes (Signed)
ED TO INPATIENT HANDOFF REPORT  ED Nurse Name and Phone #:   S Name/Age/Gender [name] 85 y.o. female Room/Bed: 038C/038C  Code Status   Code Status: DNR  Home/SNF/Other Rehab Patient oriented to: self, place, time and situation Is this baseline? Yes   Triage Complete: Triage complete  Chief Complaint Fall  Triage Note Pt fell this AM while attempting to do her daily weight.  Stepped off scale and lost her balance. Denies any dizziness or sx prior to fall.  No head ijury or LOC, denies any back pain.  Pain localized to L hip with some shortening noted and rotation.  Hx of femur surgery years ago.  Patient medicated with 250 Fentanyl IV per EMS and restful but drowsy on arrival.     Allergies Allergies  Allergen Reactions  . Codeine Other (See Comments)    "talks out of her head"   . Neurontin [Gabapentin] Swelling    Leg swelling  . Oxycodone Other (See Comments)    confusion     Level of Care/Admitting Diagnosis ED Disposition    ED Disposition Condition Comment   Admit  Hospital Area: [facility] [[id number]]  Level of Care: Telemetry Medical [104]  Covid Evaluation: Screening Protocol (No Symptoms)  Diagnosis: Closed left hip fracture, initial encounter [facility]) [[id number]]  Admitting Physician: Axel Filler [phone]  Attending Physician: Axel Filler [[id number]]  Estimated length of stay: past midnight tomorrow  Certification:: I certify this patient will need inpatient services for at least 2 midnights  PT Class (Do Not Modify): Inpatient [101]  PT Acc Code (Do Not Modify): Private [1]       B Medical/Surgery History Past Medical History:  Diagnosis Date  . Cancer [facility])    breast cancer  . Cardiomyopathy ([place])    a. 04/2015 Echo: EF 55-60%;  b. 07/2015 Echo: EF 35-40%, mod LVH, antsep DK, mod AI, sev MR, sev dil LA, mildly dil RA, mild-mod TR, PASP 57mmHg-->conservatively managed.  . Chronic systolic CHF (congestive  heart failure) ([place])    a. 07/2015 Echo: EF 35-40%.  . CKD (chronic kidney disease), stage IV ([place])   . Diabetes mellitus without complication ([place])   . Essential hypertension   . HLD (hyperlipidemia)   . Permanent atrial fibrillation    a. diagnosed 04/2015 --> conservative mgmt with rate control and xarelto.  . Pulsatile neck mass    a. 11/2015 Neck U/S: pulsatile R neck mass correlates w/ a toruous and mildy ectatic segment of the R SCA measuring ~ 2.1 cm in max diameter - no change since noted on 12/16 CT.  [name] Severe mitral regurgitation by prior echocardiogram    a. 07/2015 sev MR   Past Surgical History:  Procedure Laterality Date  . ABDOMINAL HYSTERECTOMY    . BREAST LUMPECTOMY    . FRACTURE SURGERY     L femur  . JOINT REPLACEMENT     knees     A IV Location/Drains/Wounds Patient Lines/Drains/Airways Status   Active Line/Drains/Airways    Name:   Placement date:   Placement time:   Site:   Days:   Peripheral IV 11/20/18 Anterior;Proximal;Right Forearm   11/20/18    0630    Forearm   less than 1          Intake/Output Last 24 hours No intake or output data in the 24 hours ending 11/20/18 1428  Labs/Imaging Results for orders placed or performed during the hospital encounter of 11/20/18 (from the  past 48 hour(s))  SARS Coronavirus 2 (CEPHEID - Performed in [facility] hospital lab), Hosp Order     Status: None   Collection Time: 11/20/18  8:02 AM   Specimen: Nasopharyngeal Swab  Result Value Ref Range   SARS Coronavirus 2 NEGATIVE NEGATIVE    Comment: (NOTE) If result is NEGATIVE SARS-CoV-2 target nucleic acids are NOT DETECTED. The SARS-CoV-2 RNA is generally detectable in upper and lower  respiratory specimens during the acute phase of infection. The lowest  concentration of SARS-CoV-2 viral copies this assay can detect is 250  copies / mL. A negative result does not preclude SARS-CoV-2 infection  and should not be used as the sole basis for treatment or other   patient management decisions.  A negative result may occur with  improper specimen collection / handling, submission of specimen other  than nasopharyngeal swab, presence of viral mutation(s) within the  areas targeted by this assay, and inadequate number of viral copies  (<250 copies / mL). A negative result must be combined with clinical  observations, patient history, and epidemiological information. If result is POSITIVE SARS-CoV-2 target nucleic acids are DETECTED. The SARS-CoV-2 RNA is generally detectable in upper and lower  respiratory specimens dur ing the acute phase of infection.  Positive  results are indicative of active infection with SARS-CoV-2.  Clinical  correlation with patient history and other diagnostic information is  necessary to determine patient infection status.  Positive results do  not rule out bacterial infection or co-infection with other viruses. If result is PRESUMPTIVE POSTIVE SARS-CoV-2 nucleic acids MAY BE PRESENT.   A presumptive positive result was obtained on the submitted specimen  and confirmed on repeat testing.  While 2019 novel coronavirus  (SARS-CoV-2) nucleic acids may be present in the submitted sample  additional confirmatory testing may be necessary for epidemiological  and / or clinical management purposes  to differentiate between  SARS-CoV-2 and other Sarbecovirus currently known to infect humans.  If clinically indicated additional testing with an alternate test  methodology [phone]) is advised. The SARS-CoV-2 RNA is generally  detectable in upper and lower respiratory sp ecimens during the acute  phase of infection. The expected result is Negative. Fact Sheet for Patients:  StrictlyIdeas.no Fact Sheet for Healthcare Providers: BankingDealers.co.za This test is not yet approved or cleared by the [place] FDA and has been authorized for detection and/or diagnosis of SARS-CoV-2  by FDA under an Emergency Use Authorization (EUA).  This EUA will remain in effect (meaning this test can be used) for the duration of the COVID-19 declaration under Section 564(b)(1) of the Act, 21 U.S.C. section 360bbb-3(b)(1), unless the authorization is terminated or revoked sooner. Performed at [facility], [place] [address] [place], [place] [address]   CBC with Differential     Status: Abnormal   Collection Time: 11/20/18  8:11 AM  Result Value Ref Range   WBC 10.2 4.0 - 10.5 K/uL   RBC 3.80 (L) 3.87 - 5.11 MIL/uL   Hemoglobin 11.4 (L) 12.0 - 15.0 g/dL   HCT 34.2 (L) 36.0 - 46.0 %   MCV 90.0 80.0 - 100.0 fL   MCH 30.0 26.0 - 34.0 pg   MCHC 33.3 30.0 - 36.0 g/dL   RDW 13.4 11.5 - 15.5 %   Platelets 221 150 - 400 K/uL   nRBC 0.0 0.0 - 0.2 %   Neutrophils Relative % 78 %   Neutro Abs 8.1 (H) 1.7 - 7.7 K/uL   Lymphocytes  Relative 7 %   Lymphs Abs 0.7 0.7 - 4.0 K/uL   Monocytes Relative 7 %   Monocytes Absolute 0.7 0.1 - 1.0 K/uL   Eosinophils Relative 6 %   Eosinophils Absolute 0.6 (H) 0.0 - 0.5 K/uL   Basophils Relative 1 %   Basophils Absolute 0.1 0.0 - 0.1 K/uL   Immature Granulocytes 1 %   Abs Immature Granulocytes 0.08 (H) 0.00 - 0.07 K/uL    Comment: Performed at [facility] [address], [place], [place] [address]  Comprehensive metabolic panel     Status: Abnormal   Collection Time: 11/20/18  8:11 AM  Result Value Ref Range   Sodium 137 135 - 145 mmol/L   Potassium 4.8 3.5 - 5.1 mmol/L   Chloride 103 98 - 111 mmol/L   CO2 21 (L) 22 - 32 mmol/L   Glucose, Bld 169 (H) 70 - 99 mg/dL   BUN 68 (H) 8 - 23 mg/dL   Creatinine, Ser 2.75 (H) 0.44 - 1.00 mg/dL   Calcium 9.4 8.9 - 10.3 mg/dL   Total Protein 6.5 6.5 - 8.1 g/dL   Albumin 3.5 3.5 - 5.0 g/dL   AST 20 15 - 41 U/L   ALT 16 0 - 44 U/L   Alkaline Phosphatase 56 38 - 126 U/L   Total Bilirubin 0.7 0.3 - 1.2 mg/dL   GFR calc non Af Amer 15 (L) >60 mL/min   GFR calc Af Amer 17 (L) >60 mL/min    Anion gap 13 5 - 15    Comment: Performed at [facility] [address], [place], [place] [address]  CBG monitoring, ED     Status: Abnormal   Collection Time: 11/20/18  1:13 PM  Result Value Ref Range   Glucose-Capillary 154 (H) 70 - 99 mg/dL   Comment 1 Notify RN    Comment 2 Document in Chart   POC CBG, ED     Status: Abnormal   Collection Time: 11/20/18  2:15 PM  Result Value Ref Range   Glucose-Capillary 150 (H) 70 - 99 mg/dL   Comment 1 Notify RN    Comment 2 Document in Chart    Dg Chest 1 View  Result Date: 11/20/2018 CLINICAL DATA:  No chest comp,,pt fell today and having LT hip pain EXAM: CHEST  1 VIEW COMPARISON:  12/23/2017. FINDINGS: Interval resolution of the interstitial edema/infiltrate seen previously. Coarse interstitial and airspace opacities in the left apex, which were not evident on earlier film of 12/26/2015 coarse peripheral interstitial opacities particularly laterally at the right lung base. Heart size upper limits normal for technique. Aortic Atherosclerosis (ICD10-170.0). No effusion. Mild thoracic scoliosis with endplate spurring. DJD in bilateral shoulders. IMPRESSION: 1. Coarse interstitial and airspace opacities in the left apex, new since 12/26/2015. Electronically Signed   By: Lucrezia Europe M.D.   On: 11/20/2018 08:26   Ct Head Wo Contrast  Result Date: 11/20/2018 CLINICAL DATA:  85 year old female with history of minor head trauma. EXAM: CT HEAD WITHOUT CONTRAST TECHNIQUE: Contiguous axial images were obtained from the base of the skull through the vertex without intravenous contrast. COMPARISON:  Head CT 05/09/2015. FINDINGS: Brain: Mild cerebral atrophy. Patchy and confluent areas of decreased attenuation are noted throughout the deep and periventricular white matter of the cerebral hemispheres bilaterally, compatible with chronic microvascular ischemic disease. No evidence of acute infarction, hemorrhage, hydrocephalus, extra-axial collection or mass  lesion/mass effect. Vascular: No hyperdense vessel or unexpected calcification. Skull: Normal.  Negative for fracture or focal lesion. Sinuses/Orbits: Complete opacification of the left maxillary sinus and right sphenoid sinus where there is also extensive mucoperiosteal thickening, compatible with chronic sinusitis. Multifocal opacification of the left ethmoid sinuses also noted. No air-fluid levels. Orbits are unremarkable in appearance. Other: None. IMPRESSION: 1. No acute displaced skull fractures or findings to suggest significant acute intracranial trauma. 2. Mild cerebral atrophy. 3. Chronic microvascular ischemic changes in the cerebral white matter, as above. Electronically Signed   By: Vinnie Langton M.D.   On: 11/20/2018 10:50   Dg Hip Unilat W Or Wo Pelvis 2-3 Views Left  Result Date: 11/20/2018 CLINICAL DATA:  Pain post fall EXAM: DG HIP (WITH OR WITHOUT PELVIS) 2-3V LEFT COMPARISON:  04/08/2006 FINDINGS: Comminuted left intertrochanteric femur fracture, mildly distracted with mild varus deformity. No dislocation. Bony pelvis appears intact. Diffuse osteopenia. Orthopedic hardware in the distal femoral shaft is partially visualized. Patchy bilateral femoral arterial calcifications. IMPRESSION: 1. Comminuted left intertrochanteric femur fracture. Electronically Signed   By: Lucrezia Europe M.D.   On: 11/20/2018 08:29    Pending Labs Unresulted Labs (From admission, onward)    Start     Ordered   11/21/18 6283  Basic metabolic panel  Tomorrow morning,   R     11/20/18 0949   11/21/18 0500  CBC  Tomorrow morning,   R     11/20/18 0949   11/20/18 0948  Magnesium  Add-on,   AD     11/20/18 0949          Vitals/Pain Today's Vitals   11/20/18 1330 11/20/18 1345 11/20/18 1400 11/20/18 1415  BP: (!) 179/96 (!) 166/86 (!) 180/87 (!) 170/89  Pulse: 93 97 (!) 115 (!) 116  Resp: (!) 26 (!) 23 (!) 29 (!) 22  Temp:      TempSrc:      SpO2: 96% 94% 95% 96%  PainSc:        Isolation  Precautions No active isolations  Medications Medications  carvedilol (COREG) tablet 25 mg (has no administration in time range)  diltiazem (CARDIZEM CD) 24 hr capsule 120 mg (has no administration in time range)  hydrALAZINE (APRESOLINE) tablet 10 mg (has no administration in time range)  isosorbide mononitrate (IMDUR) 24 hr tablet 30 mg (has no administration in time range)  LORazepam (ATIVAN) tablet 0.5 mg (has no administration in time range)  Propylene Glycol 0.6 % SOLN 1 drop (has no administration in time range)  acetaminophen (TYLENOL) tablet 650 mg (has no administration in time range)    Or  acetaminophen (TYLENOL) suppository 650 mg (has no administration in time range)  oxyCODONE (Oxy IR/ROXICODONE) immediate release tablet 5 mg (5 mg Oral Given 11/20/18 1124)  senna (SENOKOT) tablet 8.6 mg (has no administration in time range)  ondansetron (ZOFRAN) tablet 4 mg (4 mg Oral Given 11/20/18 1124)    Or  ondansetron (ZOFRAN) injection 4 mg ( Intravenous See Alternative 11/20/18 1124)  insulin aspart (novoLOG) injection 0-9 Units (has no administration in time range)  furosemide (LASIX) tablet 40 mg (has no administration in time range)  HYDROmorphone (DILAUDID) injection 0.5 mg (0.5 mg Intravenous Given 11/20/18 0816)  ondansetron (ZOFRAN) injection 4 mg (4 mg Intravenous Given 11/20/18 0948)    Mobility walks with device High fall risk   Focused Assessments Neuro Assessment Handoff:  Swallow screen pass? Yes  Cardiac Rhythm: Atrial fibrillation       Neuro Assessment:   Neuro Checks:      Last  Documented NIHSS Modified Score:   Has TPA been given? No If patient is a Neuro Trauma and patient is going to OR before floor call report to [place] nurse: [phone] or [phone]     R Recommendations: See Admitting Provider Note  Report given to:   Additional Notes:

## 2018-11-20 NOTE — H&P (Signed)
Date: 11/20/2018               Patient Name:  [name] MRN: [id number]  DOB: 07-Jul-1925 Age / Sex: 85 y.o., female   PCP: Bernerd Limbo, MD         Medical Service: Internal Medicine Teaching Service         Attending Physician: Dr. Evette Doffing, Mallie Mussel, *    First Contact: Dr. Truman Hayward Pager: [phone]  Second Contact: Dr. Berline Lopes Pager: [phone]       After Hours (After 5p/  First Contact Pager: [phone]  weekends / holidays): Second Contact Pager: [phone]   Chief Complaint: fall  History of Present Illness:  Ms. Borger is a 85yo female with PMH of HFrEF (35-40%), a fib on Eliquis, HTN, T2DM, HLD, chronic hypoxic respiratory failure on 3L [place] at night prn presenting to [facility] after a fall this morning.  Patient states that she was getting down from her scale this morning she thinks she lost balance and fell hitting her left hip.  Afterwards she experienced left hip pain and was unable to ambulate prompting a call to EMS.  Prior to the fall patient denies any dizziness, lightheadedness, chest pain, palpitations, shortness of breath, headaches, vision changes, focal weakness, numbness/tingling.  She states she has been in her normal state of health and has not had any recent changes; she did notice a 5 pound weight gain over the last week.  She thinks her usual weight is 129lbs but yesterday she was 134lbs.  Further she denies any fevers, chills, abdominal pain, nausea, vomiting, diarrhea, dysuria, hematuria, hematochezia, melena.   In the ED patient was slightly hypertensive, heart rate was in the 60s, she was afebrile, and was saturating in the mid 90s on 3 L nasal cannula.  EKG noted atrial fibrillation with LBBB.  Chest x-ray noted right upper lobe haziness.  Left hip x-ray reveals a left intertrochanteric fracture.  CBC revealed no leukocytosis, mild anemia at 11.9, platelets 221.  CMP revealed sodium 137, potassium 4.8, chloride 103, bicarb 21, BUN 68, creatinine 0.75, glucose  163, unremarkable liver function.  Orthopedics was consulted for the fracture.  MTS was asked to admit for further medical management.  Meds:  Current Meds  Medication Sig  . apixaban (ELIQUIS) 2.5 MG TABS tablet Take 1 tablet (2.5 mg total) by mouth 2 (two) times daily.  . carvedilol (COREG) 25 MG tablet Take 1 tablet (25 mg total) by mouth at bedtime.  [name] diltiazem (CARDIZEM CD) 120 MG 24 hr capsule Take 1 capsule (120 mg total) by mouth daily.  . ferrous sulfate 325 (65 FE) MG tablet Take 325 mg by mouth 2 (two) times daily with a meal.   . hydrALAZINE (APRESOLINE) 10 MG tablet Take 1 tablet (10 mg total) by mouth 3 (three) times daily.  . isosorbide mononitrate (IMDUR) 30 MG 24 hr tablet Take 1 tablet (30 mg total) by mouth daily.  [name] LORazepam (ATIVAN) 0.5 MG tablet Take 0.5 mg by mouth at bedtime.  [name] LYRICA 25 MG capsule Take 25 mg by mouth 2 (two) times daily as needed.  . OXYGEN Inhale 3 L into the lungs See admin instructions. Use every night at bedtime and during the day if needed for shortness of breath  . Resveratrol 250 MG CAPS Take 250 mg by mouth daily.  . saxagliptin HCl (ONGLYZA) 2.5 MG TABS tablet Take 2.5 mg by mouth daily.     Allergies: Allergies as of 11/20/2018 -  Review Complete 09/16/2018  Allergen Reaction Noted  . Codeine Other (See Comments) 03/07/2012  . Neurontin [gabapentin] Swelling 12/23/2017  . Oxycodone Other (See Comments) 05/09/2015   Past Medical History:  Diagnosis Date  . Cancer [facility])    breast cancer  . Cardiomyopathy ([place])    a. 04/2015 Echo: EF 55-60%;  b. 07/2015 Echo: EF 35-40%, mod LVH, antsep DK, mod AI, sev MR, sev dil LA, mildly dil RA, mild-mod TR, PASP 62mmHg-->conservatively managed.  . Chronic systolic CHF (congestive heart failure) ([place])    a. 07/2015 Echo: EF 35-40%.  . CKD (chronic kidney disease), stage IV ([place])   . Diabetes mellitus without complication ([place])   . Essential hypertension   . HLD (hyperlipidemia)   . Permanent  atrial fibrillation    a. diagnosed 04/2015 --> conservative mgmt with rate control and xarelto.  . Pulsatile neck mass    a. 11/2015 Neck U/S: pulsatile R neck mass correlates w/ a toruous and mildy ectatic segment of the R SCA measuring ~ 2.1 cm in max diameter - no change since noted on 12/16 CT.  [name] Severe mitral regurgitation by prior echocardiogram    a. 07/2015 sev MR    Family History: Patient denies any known family history of heart disease, lung disease, cancers.  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  She denies history of smoking, alcohol use, or drug use.   Review of Systems: A complete ROS was negative except as per HPI.   Physical Exam: Blood pressure (!) 188/83, pulse 90, temperature (!) 97.4 F (36.3 C), temperature source Oral, resp. rate 20, SpO2 (!) 89 %. GENERAL- alert, co-operative, appears as stated age, not in any distress. HEENT- EOMI, oral mucosa appears moist, mild left lower facial droop that patient states is chronic. CARDIAC- irregularly irregular rhythm, no murmurs, rubs or gallops. RESP- Moving equal volumes of air, and clear to auscultation bilaterally on anterior lung fields, no wheezes or crackles. ABDOMEN- Soft, nontender, bowel sounds present. NEURO- mild left lower facial droop that patient states is chronic, otherwise CN 2-12 intact. Sensation intact throughout. UE strength intact. RLE strength intact. Able to move her left foot but further testing deferred. EXTREMITIES- pulse 2+ PT, symmetric, no pedal edema. Left LE shorter and externally rotated. SKIN- Warm, dry. PSYCH- Normal mood and affect, appropriate thought content and speech.  EKG: personally reviewed my interpretation is a fib, LBBB, no acute ST or TW changes compared to priors. LBBB present even back in 2017 correlating to last Echo revealing HFrEF.  CXR: personally reviewed my interpretation is rotated film; left upper lung field  opacity, mild vascular congestion.  Left hip xray:  1. Comminuted left intertrochanteric femur fracture.  Assessment & Plan by Problem: Principal Problem:   Closed left hip fracture, initial encounter [facility]) Active Problems:   Atrial fibrillation ([place])   Fall   Essential hypertension, benign   Diabetes mellitus without complication (HCC)   CKD (chronic kidney disease), stage V ([place])  Left hip intertrochanteric fracture: Patient neurovascularly intact currently. Ortho on board and plan to operate tomorrow tentatively. Patient's RCI score is 2, correlating to 10% chance of 30-day mortality, MI or cardiac arrest. Patient is currently HD stable and rate controlled with her A fib; renal function is stable compared to priors. Will make sure she is medically optimized for surgery. Plan for surgery tomorrow --hold eliquis until after surgery; SCDs --oxy IR and tylenol prn for pain; can add prn  dilaudid if uncontrolled --bowel regimen --plan for surgery tomorrow, NPO after midnight --PT/OT to start day after surgery --will need discussion with PCP about benefit of starting bisphosphonate  Fall: Patient states she lost balance after stepping off scale this morning, had no LOC or pre-syncopal symptoms. Labs and workup so far unremarkable for any other cause for her fall.  --f/u CT head wo contrast --f/u AM labs  Atrial fibrillation: Patient in permanent a fib, rate controlled with coreg and diltiazem.  --continue coreg and diltiazem --hold eliquis until after surgery  HTN HFrEF: Hypertensive on admission in setting of pain and missing her AM medicines. Asymptomatic. She does state her weight has increased 5lbs over the last week (dry weight 129lbs); she does not look volume overloaded on exam. --coreg, diltiazem, hydralazine, imdur --increase lasix to 40mg  daily --daily weights, strict ins/outs --f/u AM bmet and mag  T2DM: Home regimen is saxagliptin 2.5mg  daily. --SSI-S while  hospitalized  CKD stage 4: Baseline Cr 2.2-2.7; current Cr 2.75 with stable electrolytes.  --f/u AM bmet  Left upper lobe opacity: Asymptomatic; patient noted to have RUL opacity in Feb 2020 that seems to have cleared. These could represent silent aspiration events. --consider f/u CXR in 4-6wks to ensure resolution --monitor for infectious symptoms but will hold off on starting antibiotics for now as no clear indication  Diet: HH/CM/FR IVF: n/a VTE ppx: SCDs Code: DNR - discussed on admission  Dispo: Admit patient to Inpatient with expected length of stay greater than 2 midnights.  Signed: Alphonzo Grieve, MD 11/20/2018, 9:55 AM

## 2018-11-21 ENCOUNTER — Encounter (HOSPITAL_COMMUNITY): Payer: Self-pay | Admitting: Orthopedic Surgery

## 2018-11-21 ENCOUNTER — Inpatient Hospital Stay (HOSPITAL_COMMUNITY): Payer: Medicare Other

## 2018-11-21 ENCOUNTER — Encounter (HOSPITAL_COMMUNITY)
Admission: EM | Disposition: A | Discharge status: Home Health | Payer: Self-pay | Source: Home / Self Care | Attending: Student in an Organized Health Care Education/Training Program

## 2018-11-21 ENCOUNTER — Inpatient Hospital Stay (HOSPITAL_COMMUNITY): Payer: Medicare Other | Admitting: Anesthesiology

## 2018-11-21 DIAGNOSIS — I4891 Unspecified atrial fibrillation: Secondary | ICD-10-CM

## 2018-11-21 HISTORY — PX: ORIF HIP FRACTURE: SHX2125

## 2018-11-21 LAB — CBC
HCT: 32.4 % — ABNORMAL LOW (ref 36.0–46.0)
HCT: 29.2 % — ABNORMAL LOW (ref 36.0–46.0)
Hemoglobin: 11.1 g/dL — ABNORMAL LOW (ref 12.0–15.0)
Hemoglobin: 10 g/dL — ABNORMAL LOW (ref 12.0–15.0)
MCH: 30.2 pg (ref 26.0–34.0)
MCH: 30.8 pg (ref 26.0–34.0)
MCHC: 34.3 g/dL (ref 30.0–36.0)
MCHC: 34.2 g/dL (ref 30.0–36.0)
MCV: 88.3 fL (ref 80.0–100.0)
MCV: 89.8 fL (ref 80.0–100.0)
Platelets: 206 10*3/uL (ref 150–400)
Platelets: 191 10*3/uL (ref 150–400)
RBC: 3.67 MIL/uL — ABNORMAL LOW (ref 3.87–5.11)
RBC: 3.25 MIL/uL — ABNORMAL LOW (ref 3.87–5.11)
RDW: 13.2 % (ref 11.5–15.5)
RDW: 13.3 % (ref 11.5–15.5)
WBC: 10.8 10*3/uL — ABNORMAL HIGH (ref 4.0–10.5)
WBC: 12.6 10*3/uL — ABNORMAL HIGH (ref 4.0–10.5)
nRBC: 0 % (ref 0.0–0.2)
nRBC: 0 % (ref 0.0–0.2)

## 2018-11-21 LAB — GLUCOSE, CAPILLARY
Glucose-Capillary: 260 mg/dL — ABNORMAL HIGH (ref 70–99)
Glucose-Capillary: 84 mg/dL (ref 70–99)
Glucose-Capillary: 122 mg/dL — ABNORMAL HIGH (ref 70–99)
Glucose-Capillary: 140 mg/dL — ABNORMAL HIGH (ref 70–99)

## 2018-11-21 LAB — BASIC METABOLIC PANEL
Anion gap: 10 (ref 5–15)
BUN: 66 mg/dL — ABNORMAL HIGH (ref 8–23)
CO2: 21 mmol/L — ABNORMAL LOW (ref 22–32)
Calcium: 9.2 mg/dL (ref 8.9–10.3)
Chloride: 106 mmol/L (ref 98–111)
Creatinine, Ser: 2.39 mg/dL — ABNORMAL HIGH (ref 0.44–1.00)
GFR calc Af Amer: 20 mL/min — ABNORMAL LOW (ref >60)
GFR calc non Af Amer: 17 mL/min — ABNORMAL LOW (ref >60)
Glucose, Bld: 150 mg/dL — ABNORMAL HIGH (ref 70–99)
Potassium: 4.4 mmol/L (ref 3.5–5.1)
Sodium: 137 mmol/L (ref 135–145)

## 2018-11-21 LAB — MAGNESIUM: Magnesium: 2.4 mg/dL (ref 1.7–2.4)

## 2018-11-21 LAB — TYPE AND SCREEN
ABO/RH(D): A NEG
Antibody Screen: NEGATIVE

## 2018-11-21 LAB — PREPARE RBC (CROSSMATCH)

## 2018-11-21 SURGERY — OPEN REDUCTION INTERNAL FIXATION HIP
Anesthesia: General | Site: Hip | Laterality: Left

## 2018-11-21 MED ORDER — LIDOCAINE 2% (20 MG/ML) 5 ML SYRINGE
INTRAMUSCULAR | Status: DC | PRN
  Administered 2018-11-21: 60 mg via INTRAVENOUS

## 2018-11-21 MED ORDER — VANCOMYCIN HCL 1000 MG IV SOLR
INTRAVENOUS | Status: DC | PRN
  Administered 2018-11-21: 1000 mg via TOPICAL

## 2018-11-21 MED ORDER — PROPOFOL 500 MG/50ML IV EMUL
INTRAVENOUS | Status: DC | PRN
  Administered 2018-11-21: 10 ug/kg/min via INTRAVENOUS

## 2018-11-21 MED ORDER — CEFAZOLIN SODIUM-DEXTROSE 2-4 GM/100ML-% IV SOLN
INTRAVENOUS | Status: AC
  Filled 2018-11-21: qty 100

## 2018-11-21 MED ORDER — FENTANYL CITRATE (PF) 250 MCG/5ML IJ SOLN
INTRAMUSCULAR | Status: DC | PRN
  Administered 2018-11-21 (×4): 25 ug via INTRAVENOUS

## 2018-11-21 MED ORDER — ALBUMIN HUMAN 5 % IV SOLN
INTRAVENOUS | Status: DC | PRN
  Administered 2018-11-21: 12:00:00 via INTRAVENOUS

## 2018-11-21 MED ORDER — ONDANSETRON HCL 4 MG/2ML IJ SOLN
INTRAMUSCULAR | Status: DC | PRN
  Administered 2018-11-21: 4 mg via INTRAVENOUS

## 2018-11-21 MED ORDER — SODIUM CHLORIDE 0.9 % IV SOLN: INTRAVENOUS | Status: DC

## 2018-11-21 MED ORDER — 0.9 % SODIUM CHLORIDE (POUR BTL) OPTIME
TOPICAL | Status: DC | PRN
  Administered 2018-11-21: 1000 mL

## 2018-11-21 MED ORDER — PROPOFOL 10 MG/ML IV BOLUS
INTRAVENOUS | Status: AC
  Filled 2018-11-21: qty 20

## 2018-11-21 MED ORDER — LIDOCAINE 2% (20 MG/ML) 5 ML SYRINGE
INTRAMUSCULAR | Status: AC
  Filled 2018-11-21: qty 5

## 2018-11-21 MED ORDER — EPHEDRINE SULFATE 50 MG/ML IJ SOLN
INTRAMUSCULAR | Status: DC | PRN
  Administered 2018-11-21: 5 mg via INTRAVENOUS

## 2018-11-21 MED ORDER — FENTANYL CITRATE (PF) 100 MCG/2ML IJ SOLN: 25.0000 ug | INTRAMUSCULAR | Status: DC | PRN

## 2018-11-21 MED ORDER — STERILE WATER FOR IRRIGATION IR SOLN
Status: DC | PRN
  Administered 2018-11-21: 1000 mL

## 2018-11-21 MED ORDER — PHENYLEPHRINE HCL (PRESSORS) 10 MG/ML IV SOLN
INTRAVENOUS | Status: DC | PRN
  Administered 2018-11-21 (×2): 80 ug via INTRAVENOUS

## 2018-11-21 MED ORDER — PHENYLEPHRINE 40 MCG/ML (10ML) SYRINGE FOR IV PUSH (FOR BLOOD PRESSURE SUPPORT)
PREFILLED_SYRINGE | INTRAVENOUS | Status: AC
  Filled 2018-11-21: qty 10

## 2018-11-21 MED ORDER — ROCURONIUM BROMIDE 10 MG/ML (PF) SYRINGE
PREFILLED_SYRINGE | INTRAVENOUS | Status: AC
  Filled 2018-11-21: qty 10

## 2018-11-21 MED ORDER — METHOCARBAMOL 500 MG PO TABS: 500.0000 mg | ORAL_TABLET | Freq: Four times a day (QID) | ORAL | Status: DC | PRN

## 2018-11-21 MED ORDER — SODIUM CHLORIDE 0.9 % IV SOLN
INTRAVENOUS | Status: DC | PRN
  Administered 2018-11-21: 20 ug/min via INTRAVENOUS

## 2018-11-21 MED ORDER — SODIUM CHLORIDE 0.9% IV SOLUTION: Freq: Once | INTRAVENOUS | Status: DC

## 2018-11-21 MED ORDER — PROPOFOL 10 MG/ML IV BOLUS
INTRAVENOUS | Status: DC | PRN
  Administered 2018-11-21: 80 mg via INTRAVENOUS

## 2018-11-21 MED ORDER — EPHEDRINE 5 MG/ML INJ
INTRAVENOUS | Status: AC
  Filled 2018-11-21: qty 10

## 2018-11-21 MED ORDER — CEFAZOLIN SODIUM-DEXTROSE 2-4 GM/100ML-% IV SOLN
2.0000 g | Freq: Three times a day (TID) | INTRAVENOUS | Status: AC
  Administered 2018-11-21 – 2018-11-22 (×3): 2 g via INTRAVENOUS
  Filled 2018-11-21 (×3): qty 100

## 2018-11-21 MED ORDER — FENTANYL CITRATE (PF) 100 MCG/2ML IJ SOLN
50.0000 ug | Freq: Once | INTRAMUSCULAR | Status: AC
  Administered 2018-11-21: 50 ug via INTRAVENOUS

## 2018-11-21 MED ORDER — FENTANYL CITRATE (PF) 100 MCG/2ML IJ SOLN
INTRAMUSCULAR | Status: AC
  Filled 2018-11-21: qty 2

## 2018-11-21 MED ORDER — PROPOFOL 1000 MG/100ML IV EMUL
INTRAVENOUS | Status: AC
  Filled 2018-11-21: qty 100

## 2018-11-21 MED ORDER — VANCOMYCIN HCL 1000 MG IV SOLR
INTRAVENOUS | Status: AC
  Filled 2018-11-21: qty 1000

## 2018-11-21 MED ORDER — ONDANSETRON HCL 4 MG/2ML IJ SOLN
INTRAMUSCULAR | Status: AC
  Filled 2018-11-21: qty 2

## 2018-11-21 MED ORDER — ASPIRIN 325 MG PO TABS: 325.0000 mg | ORAL_TABLET | Freq: Every day | ORAL | Status: DC

## 2018-11-21 MED ORDER — LACTATED RINGERS IV SOLN
INTRAVENOUS | Status: DC | PRN
  Administered 2018-11-21: 10:00:00 via INTRAVENOUS

## 2018-11-21 MED ORDER — ACETAMINOPHEN 500 MG PO TABS: 1000.0000 mg | ORAL_TABLET | Freq: Four times a day (QID) | ORAL | Status: DC

## 2018-11-21 MED ORDER — ONDANSETRON HCL 4 MG/2ML IJ SOLN: 4.0000 mg | Freq: Once | INTRAMUSCULAR | Status: DC | PRN

## 2018-11-21 MED ORDER — FENTANYL CITRATE (PF) 250 MCG/5ML IJ SOLN
INTRAMUSCULAR | Status: AC
  Filled 2018-11-21: qty 5

## 2018-11-21 SURGICAL SUPPLY — 50 items
ADH SKN CLS APL DERMABOND .7 (GAUZE/BANDAGES/DRESSINGS) ×2
APL PRP STRL LF DISP 70% ISPRP (MISCELLANEOUS) ×2
BIT DRILL Q/COUPLING 1 (BIT) ×1 IMPLANT
BRUSH SCRUB EZ  4% CHG (MISCELLANEOUS) ×1
BRUSH SCRUB EZ 4% CHG (MISCELLANEOUS) IMPLANT
CHLORAPREP W/TINT 26 (MISCELLANEOUS) ×1 IMPLANT
COVER PERINEAL POST (MISCELLANEOUS) ×3 IMPLANT
COVER SURGICAL LIGHT HANDLE (MISCELLANEOUS) ×6 IMPLANT
COVER WAND RF STERILE (DRAPES) ×3 IMPLANT
DERMABOND ADVANCED (GAUZE/BANDAGES/DRESSINGS) ×1
DERMABOND ADVANCED .7 DNX12 (GAUZE/BANDAGES/DRESSINGS) ×2 IMPLANT
DRAPE C-ARM 42X72 X-RAY (DRAPES) ×1 IMPLANT
DRAPE IMP U-DRAPE 54X76 (DRAPES) ×8 IMPLANT
DRAPE SURG ISO 125X83 STRL (DRAPES) ×1 IMPLANT
DRSG MEPILEX BORDER 4X12 (GAUZE/BANDAGES/DRESSINGS) ×1 IMPLANT
DRSG MEPILEX BORDER 4X4 (GAUZE/BANDAGES/DRESSINGS) ×2 IMPLANT
DRSG MEPILEX BORDER 4X8 (GAUZE/BANDAGES/DRESSINGS) ×2 IMPLANT
ELECT REM PT RETURN 9FT ADLT (ELECTROSURGICAL) ×3
ELECTRODE REM PT RTRN 9FT ADLT (ELECTROSURGICAL) ×2 IMPLANT
GLOVE BIO SURGEON STRL SZ 6.5 (GLOVE) ×10 IMPLANT
GLOVE BIO SURGEON STRL SZ7.5 (GLOVE) ×12 IMPLANT
GLOVE BIOGEL PI IND STRL 6.5 (GLOVE) ×2 IMPLANT
GLOVE BIOGEL PI IND STRL 7.5 (GLOVE) ×2 IMPLANT
GLOVE BIOGEL PI INDICATOR 6.5 (GLOVE) ×1
GLOVE BIOGEL PI INDICATOR 7.5 (GLOVE) ×1
GOWN STRL REUS W/ TWL LRG LVL3 (GOWN DISPOSABLE) ×4 IMPLANT
GOWN STRL REUS W/TWL LRG LVL3 (GOWN DISPOSABLE) ×9
GUIDEPIN DHS/DCS (PIN) ×1 IMPLANT
KIT BASIN OR (CUSTOM PROCEDURE TRAY) ×3 IMPLANT
KIT TURNOVER KIT B (KITS) ×3 IMPLANT
LINER BOOT UNIVERSAL DISP (MISCELLANEOUS) ×3 IMPLANT
MANIFOLD NEPTUNE II (INSTRUMENTS) ×3 IMPLANT
NS IRRIG 1000ML POUR BTL (IV SOLUTION) ×3 IMPLANT
PACK GENERAL/GYN (CUSTOM PROCEDURE TRAY) ×3 IMPLANT
PAD ARMBOARD 7.5X6 YLW CONV (MISCELLANEOUS) ×7 IMPLANT
PLATE ANGLED 4.5X142 8H 130D (Plate) ×1 IMPLANT
SCREW COMPRESS 36MM DHS/DCS (Orthopedic Implant) ×1 IMPLANT
SCREW CORTEX ST 4.5X14 (Screw) ×1 IMPLANT
SCREW CORTEX ST 4.5X38 (Screw) ×2 IMPLANT
SCREW CORTEX ST 4.5X40 (Screw) ×1 IMPLANT
SCREW DHS LAG 85MM (Screw) ×1 IMPLANT
SCRUB FOAM CHG 2% SURGICAL (MISCELLANEOUS) ×1 IMPLANT
STAPLER VISISTAT 35W (STAPLE) ×2 IMPLANT
SUT MNCRL AB 3-0 PS2 18 (SUTURE) ×3 IMPLANT
SUT VIC AB 0 CT1 27 (SUTURE) ×6
SUT VIC AB 0 CT1 27XBRD ANBCTR (SUTURE) ×2 IMPLANT
SUT VIC AB 2-0 CT1 27 (SUTURE) ×6
SUT VIC AB 2-0 CT1 TAPERPNT 27 (SUTURE) ×4 IMPLANT
TOWEL GREEN STERILE (TOWEL DISPOSABLE) ×1 IMPLANT
WATER STERILE IRR 1000ML POUR (IV SOLUTION) ×3 IMPLANT

## 2018-11-21 NOTE — Progress Notes (Addendum)
   Subjective:  Was examined at bedside this morning and reports of pain in her left hip but did not feel it was bad enough to ask for her pain medication.. She is well aware of her orthopedic procedure today. She denies lightheadedness, dizziness or dyspnea. She stated that she is very tired today.   Objective:  Vital signs in last 24 hours: Vitals:   11/20/18 1445 11/20/18 1519 11/20/18 1900 11/21/18 0539  BP: (!) 168/74 (!) 156/93 (!) 151/101 (!) 151/59  Pulse: (!) 103 (!) 104 90 74  Resp: (!) 23 16 (!) 24 19  Temp:  97.7 F (36.5 C) 97.6 F (36.4 C) 97.8 F (36.6 C)  TempSrc:  Oral Oral Oral  SpO2: 97% 90% 97% 94%   General: A/O x3, in no acute distress, afebrile, nondiaphoretic Cardio: RRR, no mrg's, BLE nonedematous Pulmonary: CTA bilaterally, no wheezing or crackles  MSK: LLE with external rotation and retracted as compared to the RLE Psych: Appropriate affect, not depressed in appearance, engages well  Assessment/Plan:  Principal Problem:   Closed left hip fracture, initial encounter [facility]) Active Problems:   Atrial fibrillation ([place])   Fall   Essential hypertension, benign   Diabetes mellitus without complication ([place])   CKD (chronic kidney disease), stage V ([place])  Patient Encounter Summary: [name] is a 85 y.o. with PMH of HFrEF 35-40%, A-fib on Eliquis, HTN, T2DM, HLD, chronic hypoxic respiratory failure on 3L [place] who was admitted following a fall where she was found to have a a left intertrochanteric fracture. Patient undergoing surgical repair. Now hospital day 2.  A/P: Left Intertrochanteric fracture: Neurovascularly still intact today on exam. Pain well controlled. Scheduled for surgery this am. Although she is at a moderate surgical risk, given her quality of life we agree with proceeding to surgery.  -Appreciate Orthopedics post op recommendations  -Holding eliquis until after stable following surgery -Oxy IR and tylenol prn for pain -Bowel regimen  continued -PT/OT s/p surgery ordered -Consider bisphosphonate after surgery as this is a fragility fracture  HFrEF: LVEF of 35-40% most recently as of 07/2015. Does not appear volume overloaded. Will monitor and consider holding her lasix today given her NPO status and surgery.  -Continue coreg, dilt, hydralazine, and imdur -Hold lasix today, resume tomorrow -BMP in am to monitor renal function -Continue weights and strict I/O's  Fall: Appears mechanical at this time. No clear LOC, instability issue, or prodrome described that may be consistent with a primary neurological event such as a stroke or with orthostatic hypotension. CT head negative for acute intracranial process. No symptoms to suggest need to pursue MRI.  Atrial Fibrillation: Patient continues in a-fib today per tele. Rate is controlled. -Continue Coreg and diltiazem. -Holding Eliquis as above  DMII:  Home regimen is saxaglipitin 2.5mg  daily. Holding while inpatient. -On SSI-S ordered  CKDIV: Renal function slightly improved today, sCr 2.39 down from 2.75 the prior day. Stable overall. Electrolytes WNL's w/ K+ of 4.4 and mag of 2.4. -Avoid nephrotoxic agents and avoid dehydration in the setting of surgery -BMP in am following surgery  DVT prophx: SCD's (Eliquis to resume when appropriate following surgery) Diet: NPO until surgery, may order a HH diet after Code: DNR  Dispo: Anticipated discharge in approximately 2-3 day(s).   Mosetta Anis, MD 11/21/2018, 6:35 AM Pager: [phone]

## 2018-11-21 NOTE — Progress Notes (Signed)
OT Cancellation Note  Patient Details Name: [name] MRN: [id number] DOB: 1926/04/21   Cancelled Treatment:   OT session cancelled due to pt at procedure. OT will follow up after procedure.   Minus Breeding, MSOT, OTR/L  [facility]  [phone]     [name] 11/21/2018, 11:20 AM

## 2018-11-21 NOTE — Progress Notes (Signed)
Spoke with Mrs.Granda's daughter, Toya Smothers, regarding Ms.Bisping's operation today and discussed need for continued admission most likely throughout the weekend. Discussed course of the operation and stable hemoglobin post-op as well as possible disposition to SNF for rehab vs to home. Ms.Burchfield mentions that Ms.Vavrek is very active at home and will most likely request to be discharged home. She requested if it would be possible for her to stay at [facility] for inpatient rehab. Discussed the need for physical therapy evaluation prior.

## 2018-11-21 NOTE — Progress Notes (Signed)
Pt returnedto floor from surgery., no acute distress noted. Denies pain or discomfort at this time. Granddaughter was called and informed of pts condition. Continue to monitor.

## 2018-11-21 NOTE — Anesthesia Procedure Notes (Signed)
Procedure Name: LMA Insertion Date/Time: 11/21/2018 10:26 AM Performed by: Jearld Pies, CRNA Pre-anesthesia Checklist: Patient identified, Emergency Drugs available, Suction available and Patient being monitored Patient Re-evaluated:Patient Re-evaluated prior to induction Oxygen Delivery Method: Circle System Utilized Preoxygenation: Pre-oxygenation with 100% oxygen Induction Type: IV induction Ventilation: Mask ventilation without difficulty LMA: LMA inserted LMA Size: 4.0 Number of attempts: 1 Airway Equipment and Method: Bite block Placement Confirmation: positive ETCO2 Tube secured with: Tape Dental Injury: Teeth and Oropharynx as per pre-operative assessment

## 2018-11-21 NOTE — Op Note (Signed)
Orthopaedic Surgery Operative Note (CSN: [id number] ) Date of Surgery: 11/21/2018  Admit Date: 11/20/2018   Diagnoses: Pre-Op Diagnoses: Left periprosthetic intertrochanteric femur fracture  Post-Op Diagnosis: Same  Procedures: CPT 27244-Open reduction internal fixation of left intertrochanteric femur fracture  Surgeons : Primary: Shona Needles, MD  Assistant: Patrecia Pace, PA-C  Location: OR 2  Anesthesia:General  Antibiotics: Ancef 2g preop   Tourniquet time:None  Estimated Blood HQPR:916 mL  Complications:None   Specimens:None   Implants: Implant Name Type Inv. Item Serial No. Manufacturer Lot No. LRB No. Used Action  SCREW DHS LAG 85MM - [id number] Screw SCREW DHS LAG 85MM  [employer]  Left 1 Implanted  SCREW CORTEX 4.5 - [id number] Screw SCREW CORTEX 4.5  [employer]  Left 1 Implanted  COMPRESSING SCR DHS/DCS - [id number] Orthopedic Implant COMPRESSING SCR DHS/DCS  [employer]  Left 1 Implanted  130 DEG DHS PLATE-STD BARREL 8 HOLES/142MM-STERILE    DEPUY SYNTHES [id number] Left 1 Implanted  SCREW CORTEX 4.5 - [id number] Screw SCREW CORTEX 4.5  SY[employer]UMA  Left 2 Implanted  SCREW CORTEX 4.5 - [id number] Screw SCREW CORTEX 4.5  SYN[employer]ES TRAUMA  Left 1 Implanted     Indications for Surgery: 85 year old female with a history of congestive heart failure, chronic kidney disease, hypertension and diabetes who had a ground-level fall and sustained a left intertrochanteric femur fracture.  A few years ago she had a distal femoral replacement following a distal femur fracture.  She had a longstem that was placed with significant cement mantle.  Due to this being in place a cephalo-medullary device would not be able to be used for treatment of her intertrochanteric femur fracture.  I recommend proceeding with open reduction internal fixation with a dynamic hip screw device.  Risks and benefits were discussed with the patient and her daughter.  Risks included but not limited  to bleeding, infection, malunion, nonunion, hardware failure, periprosthetic fracture, nerve and blood vessel injury, hip stiffness, cut out of the fixation, even the possibility of heart attack or stroke.  The patient agreed to proceed with surgery and consent was obtained.  Operative Findings: Open reduction internal fixation of left intertrochanteric femur fracture using Synthes 8 hole 130degree DHS  Procedure: The patient was identified in the preoperative holding area. Consent was confirmed with the patient and their family and all questions were answered. The operative extremity was marked after confirmation with the patient. she was then brought back to the operating room by our anesthesia colleagues.  She was placed under general anesthetic and carefully transferred over to a Hana table.  Her feet were positioned and padded in the boot in traction was gently placed to the lower extremity.  Fluoroscopy was used to identify adequate alignment and reduction of the intertrochanteric femur fracture. The operative extremity was then prepped and draped in usual sterile fashion. A preoperative timeout was performed to verify the patient, the procedure, and the extremity. Preoperative antibiotics were dosed.  I first started out by making a direct lateral approach to the proximal hip.  Carried this through skin and subcutaneous tissue and incised through the IT band.  I identified the vastus lateralis and then left the posterior cuff and split the vastus lateralis posteriorly and elevated the anterior portion.  I extended the incision all the way down to be able to bridge the unprotected bone.  Once I had adequate exposure I then took the angled device and placed flush against the lateral cortex and placed a guidepin up  into the head neck segment confirming adequate placement with AP and lateral fluoroscopic imaging.  I then measured and chose to use a 85 mm lag screw.  I then used the step reamer to enter  the lateral cortex and create a path for the screw.  A tap was then used to tap the tract of the screw.  A 85 mm lag screw was then placed gaining excellent purchase into the head neck segment.  I then placed the 8 hole plate over top of the lag screw and malleted flush to the lateral cortex.  I then placed a nonlocking screw into the intact femoral shaft bringing the plate flush to bone.  Due to her poor bone quality I placed 2 screws in the cement mantle that was proximal to the distal femoral replacement.  I also placed 1 unicortical screw in the distal portion of the plate.  The compression screw was used to provide a slight compression to the intertrochanteric femur region.  It was then removed.  Final fluoroscopic images were obtained.  The incision was copiously irrigated.  A gram of vancomycin powder was placed in the incision.  The IT band was closed with 0 Vicryl suture.  The skin was closed with 2-0 Vicryl and 3-0 Monocryl and sealed with Dermabond.  A sterile dressing was placed.  The patient was awoken from anesthesia and taken to the PACU in stable condition.  Post Op Plan/Instructions: Patient will be weightbearing as tolerated to left lower extremity.  She will receive postoperative Ancef.  Hemoglobin will be drawn tomorrow morning to assess whether or not she can restart her Eliquis.  She will mobilize with physical therapy.  I was present and performed the entire surgery.  Patrecia Pace, PA-C did assist me throughout the case. An assistant was necessary given the difficulty in approach, maintenance of reduction and ability to instrument the fracture.   Katha Hamming, MD [facility]

## 2018-11-21 NOTE — Anesthesia Postprocedure Evaluation (Signed)
Anesthesia Post Note  Patient: Network engineer  Procedure(s) Performed: OPEN REDUCTION INTERNAL FIXATION  OF THE LEFT HIP (Left Hip)     Patient location during evaluation: PACU Anesthesia Type: General Level of consciousness: awake and alert Pain management: pain level controlled Vital Signs Assessment: post-procedure vital signs reviewed and stable Respiratory status: spontaneous breathing, nonlabored ventilation and respiratory function stable Cardiovascular status: blood pressure returned to baseline and stable Postop Assessment: no apparent nausea or vomiting Anesthetic complications: no    Last Vitals:  Vitals:   11/21/18 1443 11/21/18 2050  BP: 125/61 (!) 114/51  Pulse: 81 75  Resp: 18 14  Temp: 36.7 C (!) 36.3 C  SpO2: 92% 97%    Last Pain:  Vitals:   11/21/18 2050  TempSrc: Oral  PainSc:                  Audry Pili

## 2018-11-21 NOTE — Progress Notes (Signed)
CSW acknowledges SNF consult pending PT/OT recs.   [place], [place]

## 2018-11-21 NOTE — Transfer of Care (Signed)
Immediate Anesthesia Transfer of Care Note  Patient: [name]  Procedure(s) Performed: OPEN REDUCTION INTERNAL FIXATION  OF THE LEFT HIP (Left Hip)  Patient Location: PACU  Anesthesia Type:General  Level of Consciousness: drowsy, patient cooperative and responds to stimulation  Airway & Oxygen Therapy: Patient Spontanous Breathing and Patient connected to face mask oxygen  Post-op Assessment: Report given to RN and Post -op Vital signs reviewed and stable  Post vital signs: Reviewed and stable  Last Vitals:  Vitals Value Taken Time  BP 110/56 11/21/18 1251  Temp    Pulse 83 11/21/18 1251  Resp 23 11/21/18 1251  SpO2 97 % 11/21/18 1251  Vitals shown include unvalidated device data.  Last Pain:  Vitals:   11/21/18 0807  TempSrc: Oral  PainSc:          Complications: No apparent anesthesia complications

## 2018-11-21 NOTE — Anesthesia Preprocedure Evaluation (Addendum)
Anesthesia Evaluation  Patient identified by MRN, date of birth, ID band Patient awake    Reviewed: Allergy & Precautions, NPO status , Patient's Chart, lab work & pertinent test results  History of Anesthesia Complications Negative for: history of anesthetic complications  Airway Mallampati: II  TM Distance: >3 FB Neck ROM: Full    Dental  (+) Dental Advisory Given, Poor Dentition   Pulmonary neg pulmonary ROS,    breath sounds clear to auscultation       Cardiovascular hypertension, Pt. on medications and Pt. on home beta blockers +CHF  + dysrhythmias Atrial Fibrillation + Valvular Problems/Murmurs MR and AI  Rhythm:Irregular Rate:Normal   '17 Carotid US - 1. Minimal amount of bilateral atherosclerotic plaque, not resulting in a hemodynamically significant stenosis within either internal carotid artery. 2. Pulsatile right-sided neck mass correlates with a tortuous and mildly ectatic segment of the right subclavian artery measuring approximately 2.1 cm in maximal diameter, similar to noncontrast cervical spine CT performed 04/2015. While of doubtful clinical concern, further evaluation with CTA could be performed as clinically indicated.  '17 TTE - moderate LVH. There was focal basal hypertrophy. EF 35% to 40%. Dyskinesis of the anteroseptal myocardium. Moderate AI, severe MR. LA was severely dilated. RA was mildly dilated. Mild-mod TR. PASP was mildly increased. PA peak pressure: 33 mmHg    Neuro/Psych negative neurological ROS  negative psych ROS   GI/Hepatic negative GI ROS, Neg liver ROS,   Endo/Other  diabetes, Type 2  Renal/GU CRFRenal disease     Musculoskeletal negative musculoskeletal ROS (+)   Abdominal   Peds  Hematology  (+) anemia ,   Anesthesia Other Findings   Reproductive/Obstetrics                            Anesthesia Physical Anesthesia Plan  ASA: III  Anesthesia  Plan: General   Post-op Pain Management:    Induction: Intravenous  PONV Risk Score and Plan: 3 and Treatment may vary due to age or medical condition, Ondansetron and Propofol infusion  Airway Management Planned: LMA  Additional Equipment: None  Intra-op Plan:   Post-operative Plan: Extubation in OR  Informed Consent: I have reviewed the patients History and Physical, chart, labs and discussed the procedure including the risks, benefits and alternatives for the proposed anesthesia with the patient or authorized representative who has indicated his/her understanding and acceptance.   Patient has DNR.  Discussed DNR with patient and Suspend DNR.   Dental advisory given  Plan Discussed with: CRNA and Anesthesiologist  Anesthesia Plan Comments:        Anesthesia Quick Evaluation

## 2018-11-21 NOTE — Discharge Instructions (Signed)
Orthopaedic Trauma Service Discharge Instructions   General Discharge Instructions  WEIGHT BEARING STATUS: Weightbearing as tolerated on right leg  RANGE OF MOTION/ACTIVITY: Unrestricted range of motion of right hip and knee  Wound Care: Incisions can be left open to air if there is no drainage. If incision continues to have drainage, follow wound care instructions below. Okay to shower if no drainage from incisions.  DVT/PE prophylaxis: resume home dose of Eliquis  Diet: as you were eating previously.  Can use over the counter stool softeners and bowel preparations, such as Miralax, to help with bowel movements.  Narcotics can be constipating.  Be sure to drink plenty of fluids  PAIN MEDICATION USE AND EXPECTATIONS  You have likely been given narcotic medications to help control your pain.  After a traumatic event that results in an fracture (broken bone) with or without surgery, it is ok to use narcotic pain medications to help control one's pain.  We understand that everyone responds to pain differently and each individual patient will be evaluated on a regular basis for the continued need for narcotic medications. Ideally, narcotic medication use should last no more than 6-8 weeks (coinciding with fracture healing).   As a patient it is your responsibility as well to monitor narcotic medication use and report the amount and frequency you use these medications when you come to your office visit.   We would also advise that if you are using narcotic medications, you should take a dose prior to therapy to maximize you participation.  IF YOU ARE ON NARCOTIC MEDICATIONS IT IS NOT PERMISSIBLE TO OPERATE A MOTOR VEHICLE (MOTORCYCLE/CAR/TRUCK/MOPED) OR HEAVY MACHINERY DO NOT MIX NARCOTICS WITH OTHER CNS (CENTRAL NERVOUS SYSTEM) DEPRESSANTS SUCH AS ALCOHOL   STOP SMOKING OR USING NICOTINE PRODUCTS!!!!  As discussed nicotine severely impairs your body's ability to heal surgical and traumatic  wounds but also impairs bone healing.  Wounds and bone heal by forming microscopic blood vessels (angiogenesis) and nicotine is a vasoconstrictor (essentially, shrinks blood vessels).  Therefore, if vasoconstriction occurs to these microscopic blood vessels they essentially disappear and are unable to deliver necessary nutrients to the healing tissue.  This is one modifiable factor that you can do to dramatically increase your chances of healing your injury.    (This means no smoking, no nicotine gum, patches, etc)  DO NOT USE NONSTEROIDAL ANTI-INFLAMMATORY DRUGS (NSAID'S)  Using products such as Advil (ibuprofen), Aleve (naproxen), Motrin (ibuprofen) for additional pain control during fracture healing can delay and/or prevent the healing response.  If you would like to take over the counter (OTC) medication, Tylenol (acetaminophen) is ok.  However, some narcotic medications that are given for pain control contain acetaminophen as well. Therefore, you should not exceed more than 4000 mg of tylenol in a day if you do not have liver disease.  Also note that there are may OTC medicines, such as cold medicines and allergy medicines that my contain tylenol as well.  If you have any questions about medications and/or interactions please ask your doctor/PA or your pharmacist.      ICE AND ELEVATE INJURED/OPERATIVE EXTREMITY  Using ice and elevating the injured extremity above your heart can help with swelling and pain control.  Icing in a pulsatile fashion, such as 20 minutes on and 20 minutes off, can be followed.    Do not place ice directly on skin. Make sure there is a barrier between to skin and the ice pack.    Using frozen items  such as frozen peas works well as the conform nicely to the are that needs to be iced.  USE AN ACE WRAP OR TED HOSE FOR SWELLING CONTROL  In addition to icing and elevation, Ace wraps or TED hose are used to help limit and resolve swelling.  It is recommended to use Ace wraps or  TED hose until you are informed to stop.    When using Ace Wraps start the wrapping distally (farthest away from the body) and wrap proximally (closer to the body)   Example: If you had surgery on your leg or thing and you do not have a splint on, start the ace wrap at the toes and work your way up to the thigh        If you had surgery on your upper extremity and do not have a splint on, start the ace wrap at your fingers and work your way up to the upper arm   [place]: [phone]   VISIT OUR WEBSITE FOR ADDITIONAL INFORMATION: [email or web address]   Discharge Wound Care Instructions  Do NOT apply any ointments, solutions or lotions to pin sites or surgical wounds.  These prevent needed drainage and even though solutions like hydrogen peroxide kill bacteria, they also damage cells lining the pin sites that help fight infection.  Applying lotions or ointments can keep the wounds moist and can cause them to breakdown and open up as well. This can increase the risk for infection. When in doubt call the office.  Surgical incisions should be dressed daily.  If any drainage is noted, use one layer of adaptic, then gauze, Kerlix, and an ace wrap.  Once the incision is completely dry and without drainage, it may be left open to air out.  Showering may begin 36-48 hours later.  Cleaning gently with soap and water.  Traumatic wounds should be dressed daily as well.    One layer of adaptic, gauze, Kerlix, then ace wrap.  The adaptic can be discontinued once the draining has ceased    If you have a wet to dry dressing: wet the gauze with saline the squeeze as much saline out so the gauze is moist (not soaking wet), place moistened gauze over wound, then place a dry gauze over the moist one, followed by Kerlix wrap, then ace wrap.

## 2018-11-22 ENCOUNTER — Encounter (HOSPITAL_COMMUNITY): Payer: Self-pay | Admitting: Student

## 2018-11-22 LAB — CBC
HCT: 23.8 % — ABNORMAL LOW (ref 36.0–46.0)
Hemoglobin: 8.2 g/dL — ABNORMAL LOW (ref 12.0–15.0)
MCH: 30.9 pg (ref 26.0–34.0)
MCHC: 34.5 g/dL (ref 30.0–36.0)
MCV: 89.8 fL (ref 80.0–100.0)
Platelets: 147 10*3/uL — ABNORMAL LOW (ref 150–400)
RBC: 2.65 MIL/uL — ABNORMAL LOW (ref 3.87–5.11)
RDW: 13.3 % (ref 11.5–15.5)
WBC: 9.8 10*3/uL (ref 4.0–10.5)
nRBC: 0 % (ref 0.0–0.2)

## 2018-11-22 LAB — BASIC METABOLIC PANEL
Anion gap: 9 (ref 5–15)
BUN: 64 mg/dL — ABNORMAL HIGH (ref 8–23)
CO2: 21 mmol/L — ABNORMAL LOW (ref 22–32)
Calcium: 8.2 mg/dL — ABNORMAL LOW (ref 8.9–10.3)
Chloride: 106 mmol/L (ref 98–111)
Creatinine, Ser: 2.41 mg/dL — ABNORMAL HIGH (ref 0.44–1.00)
GFR calc Af Amer: 20 mL/min — ABNORMAL LOW (ref >60)
GFR calc non Af Amer: 17 mL/min — ABNORMAL LOW (ref >60)
Glucose, Bld: 173 mg/dL — ABNORMAL HIGH (ref 70–99)
Potassium: 4 mmol/L (ref 3.5–5.1)
Sodium: 136 mmol/L (ref 135–145)

## 2018-11-22 LAB — GLUCOSE, CAPILLARY
Glucose-Capillary: 117 mg/dL — ABNORMAL HIGH (ref 70–99)
Glucose-Capillary: 224 mg/dL — ABNORMAL HIGH (ref 70–99)
Glucose-Capillary: 169 mg/dL — ABNORMAL HIGH (ref 70–99)
Glucose-Capillary: 169 mg/dL — ABNORMAL HIGH (ref 70–99)

## 2018-11-22 LAB — HEMOGLOBIN AND HEMATOCRIT, BLOOD
HCT: 24.6 % — ABNORMAL LOW (ref 36.0–46.0)
Hemoglobin: 8.3 g/dL — ABNORMAL LOW (ref 12.0–15.0)

## 2018-11-22 LAB — VITAMIN D 25 HYDROXY (VIT D DEFICIENCY, FRACTURES): Vit D, 25-Hydroxy: 29 ng/mL — ABNORMAL LOW (ref 30.0–100.0)

## 2018-11-22 MED ORDER — OXYCODONE HCL 5 MG PO TABS: 5.0000 mg | ORAL_TABLET | ORAL | 0 refills | Status: DC | PRN

## 2018-11-22 MED ORDER — VITAMIN D 25 MCG (1000 UNIT) PO TABS
2000.0000 [IU] | ORAL_TABLET | Freq: Two times a day (BID) | ORAL | Status: DC
  Administered 2018-11-22 – 2018-11-24 (×5): 2000 [IU] via ORAL
  Filled 2018-11-22 (×5): qty 2

## 2018-11-22 MED ORDER — APIXABAN 2.5 MG PO TABS
2.5000 mg | ORAL_TABLET | Freq: Two times a day (BID) | ORAL | Status: DC
  Administered 2018-11-22 – 2018-11-24 (×5): 2.5 mg via ORAL
  Filled 2018-11-22 (×5): qty 1

## 2018-11-22 NOTE — Progress Notes (Signed)
Orthopaedic Trauma Progress Note  S: Doing fairly well this morning, has pain in left leg but is well controlled with current medication regimen. No questions or concerns. Found to have mild vitamin d deficiency, have started her on vitamin D3 supplement. Would likely benefit from continuing this after discharge  O:  Vitals:   11/21/18 2335 11/22/18 0407  BP: 106/70 117/61  Pulse: 80 66  Resp: 18 16  Temp: 98 F (36.7 C) 97.9 F (36.6 C)  SpO2: 96% 99%    General - laying in bed, NAD Cardiac - Heart regular rate and rhythm Respiratory - No increased work of breathing. Lungs CTA anterior lung fields bilaterally Left lower Extremity - Dressing in place, is clean, dry, intact. Has some discoloration/bruising around incision. Moderate tenderness with palpation of lateral thigh down to level of knee.  Non-tender directly over hip or in lower leg or foot. Does not tolerate passive flexion of knee. Dorsiflexion/plantarflexion intact. Wiggles toes. Sensation intact to light touch of extremity. +DP pulse  Imaging: Stable post op imaging.  Labs:  Results for orders placed or performed during the hospital encounter of 11/20/18 (from the past 24 hour(s))  Glucose, capillary     Status: Abnormal   Collection Time: 11/21/18  8:09 AM  Result Value Ref Range   Glucose-Capillary 260 (H) 70 - 99 mg/dL  Prepare RBC     Status: None   Collection Time: 11/21/18  9:14 AM  Result Value Ref Range   Order Confirmation      ORDER PROCESSED BY BLOOD BANK Performed at [facility], [address] [place], [place] [address]   Type and screen [place]     Status: None   Collection Time: 11/21/18  9:14 AM  Result Value Ref Range   ABO/RH(D) A NEG    Antibody Screen NEG    Sample Expiration 11/21/2018,2359   Type and screen     Status: None (Preliminary result)   Collection Time: 11/21/18  9:41 AM  Result Value Ref Range   ABO/RH(D) A NEG    Antibody Screen NEG    Sample  Expiration 11/24/2018,2359    Unit Number [id number]    Blood Component Type RED CELLS,LR    Unit division 00    Status of Unit ALLOCATED    Transfusion Status OK TO TRANSFUSE    Crossmatch Result      Compatible Performed at [facility], [place] [address], [place], [place] [address]    Unit Number [id number]    Blood Component Type RED CELLS,LR    Unit division 00    Status of Unit ALLOCATED    Transfusion Status OK TO TRANSFUSE    Crossmatch Result Compatible   CBC     Status: Abnormal   Collection Time: 11/21/18 12:29 PM  Result Value Ref Range   WBC 12.6 (H) 4.0 - 10.5 K/uL   RBC 3.25 (L) 3.87 - 5.11 MIL/uL   Hemoglobin 10.0 (L) 12.0 - 15.0 g/dL   HCT 29.2 (L) 36.0 - 46.0 %   MCV 89.8 80.0 - 100.0 fL   MCH 30.8 26.0 - 34.0 pg   MCHC 34.2 30.0 - 36.0 g/dL   RDW 13.3 11.5 - 15.5 %   Platelets 191 150 - 400 K/uL   nRBC 0.0 0.0 - 0.2 %  Glucose, capillary     Status: None   Collection Time: 11/21/18 12:51 PM  Result Value Ref Range   Glucose-Capillary 84 70 -  99 mg/dL  VITAMIN D 25 Hydroxy (Vit-D Deficiency, Fractures)     Status: Abnormal   Collection Time: 11/21/18  2:56 PM  Result Value Ref Range   Vit D, 25-Hydroxy 29.0 (L) 30.0 - 100.0 ng/mL  Glucose, capillary     Status: Abnormal   Collection Time: 11/21/18  4:06 PM  Result Value Ref Range   Glucose-Capillary 122 (H) 70 - 99 mg/dL  Glucose, capillary     Status: Abnormal   Collection Time: 11/21/18  8:52 PM  Result Value Ref Range   Glucose-Capillary 140 (H) 70 - 99 mg/dL  Basic metabolic panel     Status: Abnormal   Collection Time: 11/22/18  2:07 AM  Result Value Ref Range   Sodium 136 135 - 145 mmol/L   Potassium 4.0 3.5 - 5.1 mmol/L   Chloride 106 98 - 111 mmol/L   CO2 21 (L) 22 - 32 mmol/L   Glucose, Bld 173 (H) 70 - 99 mg/dL   BUN 64 (H) 8 - 23 mg/dL   Creatinine, Ser 2.41 (H) 0.44 - 1.00 mg/dL   Calcium 8.2 (L) 8.9 - 10.3 mg/dL   GFR calc non Af Amer 17 (L) >60 mL/min   GFR calc Af Amer  20 (L) >60 mL/min   Anion gap 9 5 - 15  CBC     Status: Abnormal   Collection Time: 11/22/18  2:07 AM  Result Value Ref Range   WBC 9.8 4.0 - 10.5 K/uL   RBC 2.65 (L) 3.87 - 5.11 MIL/uL   Hemoglobin 8.2 (L) 12.0 - 15.0 g/dL   HCT 23.8 (L) 36.0 - 46.0 %   MCV 89.8 80.0 - 100.0 fL   MCH 30.9 26.0 - 34.0 pg   MCHC 34.5 30.0 - 36.0 g/dL   RDW 13.3 11.5 - 15.5 %   Platelets 147 (L) 150 - 400 K/uL   nRBC 0.0 0.0 - 0.2 %    Assessment: 85 year old female s/p fall   Injuries: Left periprosthetic intertrochanteric femur fracture s/p ORIF with sliding hip screw device 11/21/18  Weightbearing: WBAT LLE  Insicional and dressing care: Will remove/change dressing tomorrow  Orthopedic device(s): None  CV/Blood loss: Acute blood loss anemia, Hgb 8.2 this AM. Hemodynamically stable. Continue to monitor CBC  Pain management:  1. Oxycodone 5 mg q 4 hours PRN 2. Dilaudid 0.5 mg q 3hours PRN  VTE prophylaxis: Okay to restart Eliquid from ortho perspective. Would monitor CBC  ID:  Ancef 2gm post op  Foley/Lines:  No foley, KVO IVFs  Medical co-morbidities: CKD, Diabetes mellitus, A. Fib, HTN, CHF   Dispo: PT eval today. Will likely need SNF placement. Okay for discharge from ortho standpoint once cleared by medicine team and therapies  Follow - up plan: 2 weeks for repeat x-rays for left hip    Briley Sulton A. [facility] ?[phone]? (phone)

## 2018-11-22 NOTE — Plan of Care (Signed)
  Problem: Education: Goal: Knowledge of General Education information will improve Description: Including pain rating scale, medication(s)/side effects and non-pharmacologic comfort measures Outcome: Progressing   Problem: Activity: Goal: Risk for activity intolerance will decrease Outcome: Progressing   Problem: Coping: Goal: Level of anxiety will decrease Outcome: Progressing   Problem: Pain Managment: Goal: General experience of comfort will improve Outcome: Progressing   Problem: Skin Integrity: Goal: Risk for impaired skin integrity will decrease Outcome: Progressing   

## 2018-11-22 NOTE — Progress Notes (Signed)
Spoke to Pt regarding pending discharge, Pt does not feel safe to go home. MD notified. PT recommending SNF but Pt refused. RN spoke with Pt's daughter regarding her current mobility status, daughter stated Pt would get enough family support at home and people would be around to help her with mobility.

## 2018-11-22 NOTE — Evaluation (Signed)
Physical Therapy Evaluation Patient Details Name: [name] MRN: [id number] DOB: [birth year] Today's Date: 11/22/2018   History of Present Illness  85 year old woman with chronic heart failure with moderately reduced ejection fraction, CKD stage V, hypertension and diabetes admitted after a fall at home which has resulted in a left hip fracture. Now s/p Open reduction internal fixation of left intertrochanteric femur fracture.   Clinical Impression  Patient agreeable to PT, alert, talkative, requested pain meds prior to therapy. Patient required mod/max assist performing bed mobility due to pain. Required mod + 2 assist for sit to stand transfer. Patient has difficulty effectively using UEs on rolling walker to weight shift LEs despite cues. Patient was able to take a few steps from bed to recliner. Required max assist to reposition in recliner due to feeling faint and painful. Patient will benefit from continued skilled PT to address her weakness, difficulty walking and pain.       Follow Up Recommendations SNF(patient does not want to go to SNF and may be able to return home if mobility improves)    Equipment Recommendations  None recommended by PT    Recommendations for Other Services       Precautions / Restrictions Precautions Precautions: Fall Restrictions Weight Bearing Restrictions: Yes LLE Weight Bearing: Weight bearing as tolerated      Mobility  Bed Mobility Overal bed mobility: Needs Assistance Bed Mobility: Sidelying to Sit;Supine to Sit   Sidelying to sit: Mod assist;+2 for physical assistance Supine to sit: Max assist;HOB elevated     General bed mobility comments: pain with LLE movement. cues for sequencing. Max A for all aspects. Noted diifficulty advancing RLE as well as LLE  Transfers Overall transfer level: Needs assistance Equipment used: Rolling walker (2 wheeled) Transfers: Sit to/from [employer] Sit to Stand: Max assist;+2  physical assistance Stand pivot transfers: Max assist;+2 physical assistance       General transfer comment: transferred to R side. Assist for all aspects. Multimodal cues for hand placement and sequencing. Pt leaning to right side in attempt to guard LLE.   Ambulation/Gait Ambulation/Gait assistance: Mod assist;+2 physical assistance Gait Distance (Feet): 2 Feet Assistive device: Rolling walker (2 wheeled) Gait Pattern/deviations: Step-to pattern;Decreased step length - right;Decreased step length - left;Decreased weight shift to left;Decreased stance time - left;Antalgic Gait velocity: decreased   General Gait Details: difficulty WBing through UEs to assist with LE weight shifting.  Stairs            Wheelchair Mobility    Modified Rankin (Stroke Patients Only)       Balance Overall balance assessment: Needs assistance;History of Falls Sitting-balance support: Bilateral upper extremity supported;Feet supported Sitting balance-Leahy Scale: Fair   Postural control: Right lateral lean Standing balance support: Bilateral upper extremity supported Standing balance-Leahy Scale: Fair Standing balance comment: rw and physical assist for balance. Leans right to guard LLE                             Pertinent Vitals/Pain Pain Assessment: Faces Faces Pain Scale: Hurts whole lot Pain Location: L hip with movement Pain Descriptors / Indicators: Aching;Discomfort;Operative site guarding;Guarding Pain Intervention(s): Limited activity within patient's tolerance;Monitored during session;Repositioned;Premedicated before session;Ice applied    Home Living Family/patient expects to be discharged to:: Private residence Living Arrangements: Other relatives Available Help at Discharge: Family;Available 24 hours/day Type of Home: House Home Access: Ramped entrance     Home Layout: One level Home  Equipment: Emergency planning/management officer - 2 wheels;Cane - single point      Prior  Function Level of Independence: Needs assistance   Gait / Transfers Assistance Needed: pt reports she is "suppose to use a walker", uses cane or walker  ADL's / Homemaking Assistance Needed: likely at least supervision for mobility and shower transfers        Hand Dominance        Extremity/Trunk Assessment   Upper Extremity Assessment Upper Extremity Assessment: Defer to OT evaluation    Lower Extremity Assessment Lower Extremity Assessment: Generalized weakness;RLE deficits/detail RLE: Unable to fully assess due to pain RLE Sensation: WNL RLE Coordination: decreased gross motor    Cervical / Trunk Assessment Cervical / Trunk Assessment: Kyphotic  Communication   Communication: No difficulties  Cognition Arousal/Alertness: Awake/alert Behavior During Therapy: WFL for tasks assessed/performed Overall Cognitive Status: Within Functional Limits for tasks assessed                                 General Comments: some slow processing. decreased STM. likely at basline.      General Comments      Exercises     Assessment/Plan    PT Assessment Patient needs continued PT services  PT Problem List Decreased strength;Decreased mobility;Decreased safety awareness;Decreased activity tolerance;Decreased balance;Decreased knowledge of use of DME;Pain       PT Treatment Interventions DME instruction;Therapeutic activities;Gait training;Therapeutic exercise;Patient/family education;Balance training;Functional mobility training    PT Goals (Current goals can be found in the Care Plan section)  Acute Rehab PT Goals Patient Stated Goal: to go home. "I'm not going to a nursing home." PT Goal Formulation: With patient Time For Goal Achievement: 11/26/18 Potential to Achieve Goals: Fair    Frequency Min 5X/week   Barriers to discharge        Co-evaluation PT/OT/SLP Co-Evaluation/Treatment: Yes Reason for Co-Treatment: To address functional/ADL transfers    OT goals addressed during session: ADL's and self-care       AM-PAC PT "6 Clicks" Mobility  Outcome Measure Help needed turning from your back to your side while in a flat bed without using bedrails?: A Lot Help needed moving from lying on your back to sitting on the side of a flat bed without using bedrails?: A Lot Help needed moving to and from a bed to a chair (including a wheelchair)?: A Lot Help needed standing up from a chair using your arms (e.g., wheelchair or bedside chair)?: A Lot Help needed to walk in hospital room?: A Lot Help needed climbing 3-5 steps with a railing? : Total 6 Click Score: 11    End of Session Equipment Utilized During Treatment: Gait belt Activity Tolerance: Patient limited by pain Patient left: in chair;with chair alarm set;with call bell/phone within reach Nurse Communication: Mobility status PT Visit Diagnosis: Unsteadiness on feet (R26.81);Muscle weakness (generalized) (M62.81);Difficulty in walking, not elsewhere classified (R26.2);Pain;Repeated falls (R29.6) Pain - Right/Left: Left Pain - part of body: Hip    Time: 1030-1045 PT Time Calculation (min) (ACUTE ONLY): 15 min   Charges:   PT Evaluation $PT Eval Low Complexity: 1 Low PT Treatments $Gait Training: 8-22 mins        Velvie Thomaston, PT, GCS 11/22/18,11:51 AM

## 2018-11-22 NOTE — Discharge Summary (Addendum)
Name: [name] MRN: [id number] DOB: [birth year] 85 y.o. PCP: Bernerd Limbo, MD  Date of Admission: 11/20/2018  7:13 AM Date of Discharge: 11/24/18 Attending Physician: Axel Filler, *  Discharge Diagnosis: 1. Left intertrochanteric femur fracture 2. Blood loss anemia  Discharge Medications: Allergies as of 11/22/2018      Reactions   Codeine Other (See Comments)   "talks out of her head"   Neurontin [gabapentin] Swelling   Leg swelling   Oxycodone Other (See Comments)   confusion      Medication List    TAKE these medications   acetaminophen 500 MG tablet Commonly known as: TYLENOL Take 1,000 mg by mouth every 6 (six) hours as needed for headache (pain).   apixaban 2.5 MG Tabs tablet Commonly known as: ELIQUIS Take 1 tablet (2.5 mg total) by mouth 2 (two) times daily.   carvedilol 25 MG tablet Commonly known as: COREG Take 1 tablet (25 mg total) by mouth at bedtime.   diltiazem 120 MG 24 hr capsule Commonly known as: CARDIZEM CD Take 1 capsule (120 mg total) by mouth daily.   ferrous sulfate 325 (65 FE) MG tablet Take 325 mg by mouth 2 (two) times daily with a meal.   hydrALAZINE 10 MG tablet Commonly known as: APRESOLINE Take 1 tablet (10 mg total) by mouth 3 (three) times daily.   isosorbide mononitrate 30 MG 24 hr tablet Commonly known as: IMDUR Take 1 tablet (30 mg total) by mouth daily.   LORazepam 0.5 MG tablet Commonly known as: ATIVAN Take 0.25 mg by mouth at bedtime.   LUTEIN PO Take 25 mg by mouth daily.   Lyrica 25 MG capsule Generic drug: pregabalin Take 25 mg by mouth every evening.   oxyCODONE 5 MG immediate release tablet Commonly known as: Oxy IR/ROXICODONE Take 1 tablet (5 mg total) by mouth every 4 (four) hours as needed for moderate pain.   OXYGEN Inhale 3 L into the lungs See admin instructions. Use every night at bedtime and during the day if needed for shortness of breath   polyethylene glycol 17 g packet  Commonly known as: MIRALAX / GLYCOLAX Take 17 g by mouth daily.   Resveratrol 250 MG Caps Take 250 mg by mouth daily.   saxagliptin HCl 2.5 MG Tabs tablet Commonly known as: ONGLYZA Take 2.5 mg by mouth daily.   sodium chloride 5 % ophthalmic solution Commonly known as: MURO 128 Place 1 drop into the left eye 4 (four) times daily.   torsemide 20 MG tablet Commonly known as: DEMADEX Take 1 tablet (20 mg total) by mouth daily.       Disposition and follow-up:   [name] was discharged from [facility] in Stable condition.  At the hospital follow up visit please address:  1. Left intertrochanteric femur fracture - Please ensure she follows up with orthopedics for repeat imaging in 2 weeks - Please assess for bleeding as she is on anti-coagulation - Although meeting criteria for osteoporosis, Prolia not started due to advanced age  28. Blood loss anemia - Hemoglobin 8.2 at discharge - Check cbc to ensure stable hemoglobin  2.  Labs / imaging needed at time of follow-up: CBC  3.  Pending labs/ test needing follow-up: N/A  Follow-up Appointments: Follow-up Information    Haddix, Thomasene Lot, MD. Schedule an appointment as soon as possible for a visit in 2 week(s).   Specialty: Orthopedic Surgery Why: repeat x-rays left hip Contact information: [address] [address] [place] Follow up.        Hoyt, Leonard: [place] Contact information: [place] [address] [phone]        Bernerd Limbo, MD. Call.   Specialty: Family Medicine Contact information: [place] [address] [place] [facility] Course by problem list: 1. Left intertrochanteric femur fracture: [name] is a 85 year old woman with chronic heart failure with moderately reduced ejection fraction, Atrial  Fibrillation on Eliquis, CKD stage V, hypertension and diabetes presenting to [facility] with left hip pain after a fall. X-ray was taken of her hip which showed comminuted left intertrochanteric femur fracture. CT head was also performed with no evidence of skull fracture or bleed. Orthopedic surgery was consulted who performed open reduction, internal fixation in the OR after risk stratification by internal medicine. Her post-operative hemoglobin was at 10 but overnight dropped down to 8.2. She was observed in the hospital with repeat hemoglobin showing stable at 8.3. PT and OT evaluation recommended that she be discharged to a [place] but patient and her family chose to be discharged home on home health. Discharged with recommendation to f/u with outpatient orthopedic surgery.  2. Blood loss anemia: Post-op hemoglobin showed significant decline from 10.0 to 8.2 Next day found to have declined further to 7.3. No obvious site of bleeding was noted. Given 1 unit of blood with post transfusion hemoglobin at 8.2. Discharged w/ recommendation to f/u with PCP.  Discharge Vitals:   BP (!) 115/45 (BP Location: Left Arm)   Pulse 80   Temp 98.7 F (37.1 C) (Oral)   Resp 16   Ht 5' (1.524 m)   Wt 59.2 kg   SpO2 (!) 84%   BMI 25.49 kg/m   Pertinent Labs, Studies, and Procedures:  CBC Latest Ref Rng & Units 11/22/2018 11/22/2018 11/21/2018  WBC 4.0 - 10.5 K/uL - 9.8 12.6(H)  Hemoglobin 12.0 - 15.0 g/dL 8.3(L) 8.2(L) 10.0(L)  Hematocrit 36.0 - 46.0 % 24.6(L) 23.8(L) 29.2(L)  Platelets 150 - 400 K/uL - 147(L) 191   Discharge Instructions: Discharge Instructions    Call MD for:  difficulty breathing, headache or visual disturbances   Complete by: As directed    Call MD for:  persistant dizziness or light-headedness   Complete by: As directed    Call MD for:  redness, tenderness, or signs of infection (pain, swelling, redness, odor or green/yellow discharge around incision site)   Complete by:  As directed    Call MD for:  temperature >100.4   Complete by: As directed    Diet - low sodium heart healthy   Complete by: As directed    Discharge instructions   Complete by: As directed    Dear [name]  You 85 y.o. came to [place] with leg pain. We have determined this was caused by a fracture. Here are our recommendations for you at discharge:  Please take oxycodone 5mg  as needed every 4 hours for pain Please follow up with your bone doctors in 2 weeks for x-ray  Thank you for choosing [facility].   Increase activity slowly   Complete by: As directed    Increase activity slowly   Complete by: As directed      Signed: Mosetta Anis, MD 11/22/2018, 4:16 PM   Pager: [phone]

## 2018-11-22 NOTE — TOC Initial Note (Signed)
Transition of Care [facility]) - Initial/Assessment Note    Patient Details  Name: [name] MRN: [id number] Date of Birth: 08-29-1925  Transition of Care [facility]) CM/SW Contact:    Alberteen Sam, [place] Phone Number: [phone] 11/22/2018, 2:38 PM  Clinical Narrative:                  CSW spoke with patient regarding discharge planning and SNF recommendation, patient asked CSW to call her daughter Sharyn Lull. CSW consulted with patient's daughter Sharyn Lull regarding discharge planning. She reports patient would like to go home and family has set up 24/7 assistance for patient. She reports patient lives with her granddaughter, her husband, and 4 children with the oldest being 83 years old. Patient's daughter Sharyn Lull said she also planned to stay 24/7 if need be, therefore there will always be at least 2-3 adults in the home with patient 24/7 to assist. She also reports patient has a ramp into the home, no stairs. She states patient has used [facility] in the past and they received her oxygen through [facility]. She's requesting PT and nurse aid at discharge, identifies no DME needs at this time as patient has wheel chair, walker, and cain at home and 3in 1. CSW will follow up with [facility] and see if they can take, if not CSW will explore other agencies for [facility] PT and aide.   Expected Discharge Plan: [place] Barriers to Discharge: Continued Medical Work up   Patient Goals and CMS Choice   CMS Medicare.gov Compare Post Acute Care list provided to:: Patient Represenative (must comment)(Michelle (daughter)) Choice offered to / list presented to : Adult Children(Michelle (daughter))  Expected Discharge Plan and Services Expected Discharge Plan: [place] Choice: [place] arrangements for the past 2 months: Single Family Home                                      Prior Living Arrangements/Services Living arrangements for the  past 2 months: Single Family Home Lives with:: Relatives Patient language and need for interpreter reviewed:: Yes Do you feel safe going back to the place where you live?: Yes      Need for Family Participation in Patient Care: Yes (Comment) Care giver support system in place?: Yes (comment)   Criminal Activity/Legal Involvement Pertinent to Current Situation/Hospitalization: No - Comment as needed  Activities of Daily Living      Permission Sought/Granted Permission sought to share information with : Case Manager, Family Supports, Customer service manager Permission granted to share information with : Yes, Verbal Permission Granted  Share Information with NAME: Sharyn Lull  Permission granted to share info w AGENCY: HH  Permission granted to share info w Relationship: daughter  Permission granted to share info w Contact Information: [phone]  Emotional Assessment Appearance:: Appears stated age Attitude/Demeanor/Rapport: Gracious Affect (typically observed): Calm Orientation: : Oriented to Self, Oriented to Place, Oriented to  Time, Oriented to Situation Alcohol / Substance Use: Not Applicable Psych Involvement: No (comment)  Admission diagnosis:  Closed left hip fracture, initial encounter [facility]) [S72.002A] Patient Active Problem List   Diagnosis Date Noted  . Closed left hip fracture, initial encounter ([place]) 11/20/2018  . Acute and chronic respiratory failure (acute-on-chronic) ([place]) 12/23/2017  . CKD (chronic kidney disease), stage V ([place]) 12/23/2017  . Essential hypertension   . HLD (hyperlipidemia)   .  PAF (paroxysmal atrial fibrillation) ([place])   . Cardiomyopathy ([place])   . Mitral regurgitation 08/24/2015  . Congestive dilated cardiomyopathy ([place]) 08/24/2015  . Congestive heart failure (CHF) ([place]) 08/24/2015  . Acute on chronic congestive heart failure ([place])   . Dyspnea 08/02/2015  . AKI (acute kidney injury) ([place]) 05/10/2015  .  Rib fractures - right #8-10  05/09/2015  . Atrial fibrillation ([place]) 05/09/2015  . Scapula fracture 05/09/2015  . Fall 05/09/2015  . Essential hypertension, benign 05/09/2015  . Diabetes mellitus without complication ([place]) 00/76/2263  . History of breast cancer 05/09/2015   PCP:  Bernerd Limbo, MD Pharmacy:   [id number] [place], [address] [place] [address] [place] [address] Phone: [phone] Fax: [phone]  [place], [place] [address] Phone: [phone] Fax: [phone]     Social Determinants of Health (SDOH) Interventions    Readmission Risk Interventions No flowsheet data found.

## 2018-11-22 NOTE — TOC Progression Note (Signed)
Transition of Care [facility]) - Progression Note    Patient Details  Name: [name] MRN: [id number] Date of Birth: 1925-07-31  Transition of Care [facility]) CM/SW [place], [place] Phone Number: 11/22/2018, 3:44 PM  Clinical Narrative:     Patient set up with Nanine Means for [facility] PT and [place], can start on Monday November 25, 2018.   Expected Discharge Plan: [place] Barriers to Discharge: Continued Medical Work up  Expected Discharge Plan and Services Expected Discharge Plan: [place] Choice: [place] arrangements for the past 2 months: Single Family Home                                       Social Determinants of Health (SDOH) Interventions    Readmission Risk Interventions No flowsheet data found.

## 2018-11-22 NOTE — Plan of Care (Signed)
  Problem: Clinical Measurements: Goal: Ability to maintain clinical measurements within normal limits will improve Outcome: Progressing   Problem: Activity: Goal: Risk for activity intolerance will decrease Outcome: Progressing   Problem: Nutrition: Goal: Adequate nutrition will be maintained Outcome: Progressing   Problem: Coping: Goal: Level of anxiety will decrease Outcome: Progressing   Problem: Elimination: Goal: Will not experience complications related to bowel motility Outcome: Progressing   Problem: Pain Managment: Goal: General experience of comfort will improve Outcome: Progressing   Problem: Skin Integrity: Goal: Risk for impaired skin integrity will decrease Outcome: Progressing   

## 2018-11-22 NOTE — TOC Progression Note (Addendum)
Transition of Care [facility]) - Progression Note    Patient Details  Name: [name] MRN: [id number] Date of Birth: 11/03/1925  Transition of Care [facility]) CM/SW Contact  Tayjah Lobdell, Edson Snowball, RN Phone Number: 11/22/2018, 3:44 PM  Clinical Narrative:     Levonne Spiller with [facility] accepted home health referral. Start of Care will be Saturday  6/27  Expected Discharge Plan: [place] Barriers to Discharge: Continued Medical Work up  Expected Discharge Plan and Services Expected Discharge Plan: [place] arrangements for the past 2 months: Single Family Home                                       Social Determinants of Health (SDOH) Interventions    Readmission Risk Interventions No flowsheet data found.

## 2018-11-22 NOTE — Plan of Care (Signed)
  Problem: Education: Goal: Knowledge of General Education information will improve Description: Including pain rating scale, medication(s)/side effects and non-pharmacologic comfort measures Outcome: Progressing   Problem: Clinical Measurements: Goal: Cardiovascular complication will be avoided Outcome: Progressing   Problem: Activity: Goal: Risk for activity intolerance will decrease Outcome: Progressing   Problem: Nutrition: Goal: Adequate nutrition will be maintained Outcome: Progressing   Problem: Coping: Goal: Level of anxiety will decrease Outcome: Progressing   Problem: Pain Managment: Goal: General experience of comfort will improve Outcome: Progressing   

## 2018-11-22 NOTE — Progress Notes (Signed)
   Subjective:  [name] is a 85 y.o. F with PMH of HFrEF 35-40%, A-fib on Eliquis, HTN, T2DM, HLD, chronic hypoxic respiratory failure on 3L [place] admit for hip fracture on hospital day 2  [name] was examined at bedside and states she is feeling better with exception of moderate pain at the left hip. She is awaiting to work with physical therapy after she receives her pain medication. She is tolerating p.o. intake without any difficulties. She has been passing flatus but has not had BM yet.    Objective:  Vital signs in last 24 hours: Vitals:   11/21/18 2050 11/21/18 2335 11/22/18 0407 11/22/18 0500  BP: (!) 114/51 106/70 117/61   Pulse: 75 80 66   Resp: 14 18 16    Temp: (!) 97.4 F (36.3 C) 98 F (36.7 C) 97.9 F (36.6 C)   TempSrc: Oral Oral Oral   SpO2: 97% 96% 99%   Weight:    59.2 kg  Height:    5' (1.524 m)   Gen: Well-developed, well nourished, NAD Pulm: CTAB, No rales, no wheezes, no dullness to percussion  Extm: ROM intact, Peripheral pulses intact, No peripheral edema Skin: Dry, Warm, normal turgor, surgical site covered with bandaging without significant bleeding or drainage Neuro: AAOx3  Assessment/Plan:  Principal Problem:   Closed left hip fracture, initial encounter [facility]) Active Problems:   Atrial fibrillation (HCC)   Fall   Essential hypertension, benign   Diabetes mellitus without complication (HCC)   CKD (chronic kidney disease), stage V ([place])  [name] is a 85 yo F w/ PMH of HFrEF (35-40%), Afib on Eliquis, HTN, T2DM, HLD chronic hypoxic respiratory failure on 3L [place] admit for left hip fracture. She is post op day 1 after ORIF by ortho and appears well without significant post surgical complications. Hemoglobin decreased down to 8 but stayed stable overnight. Will await therapy eval for dispo but expect SNF discharge. She meets diagnostic criteria for osteoporosis with fragility fracture but is poor candidate for therapy due to age and chronic kidney  disease. Will need f/u with PCP for further discussion.  Left Intertrochanteric fracture: Post op day 1. Surgical site appears clean. No lower extremity edema. Good pulses. Ortho cleared for discharge pending PT/OT eval. -Appreciate Orthopedics recs: cleared for discharge -Oxy IR PRN for pain -Bowel regimen continued -PT/OT: SNF - Social work consult for placement  HFrEF: Euvolemic on exam. LVEF of 35-40% most recently as of 07/2015. -Continue coreg, dilt, hydralazine, imdur, furosemide 40mg  daily -Continue weights and strict I/O's  Atrial Fibrillation: Cleared by ortho to restart anticoagulation - Start Eliquis 2.5mg  BID -C/wCoreg and diltiazem.  DVT prophx: Eliquis Diet: Cardiac Code: DNR  Dispo: Anticipated discharge in approximately 1-2 day(s).   Mosetta Anis, MD 11/22/2018, 6:44 AM Pager: [phone]

## 2018-11-22 NOTE — Evaluation (Signed)
Occupational Therapy Evaluation Patient Details Name: [name] MRN: [id number] DOB: 1925/08/16 Today's Date: 11/22/2018    History of Present Illness 85 year old woman with chronic heart failure with moderately reduced ejection fraction, CKD stage V, hypertension and diabetes admitted after a fall at home which has resulted in a left hip fracture. Now s/p Open reduction internal fixation of left intertrochanteric femur fracture.    Clinical Impression   Pt admitted with the above diagnoses and presents with below problem list. Pt will benefit from continued acute OT to address the below listed deficits and maximize independence with basic ADLs prior to d/c to venue below. PTA pt was mod I to supervision with ADLs and transfers. She endorses using BSC at night. Pt with a very determined spirit, willing to work with therapy despite pain "I know I need to do this so I can get home. I'm not going to a nursing home." Pt, however struggled with bed mobility and stand-pivot transfer this session needing +2 mod-max physical A. Pt reported feeling "like I'm going to pass out" after transfer." Feet elevated and head reclined, bp assessed at 135/65. Pt reported feeling better at end of session. For now, it appears pt will need ST SNF stay for rehab prior to going home. Will continue to follow and update recommendations should pt improve significantly in her level of tolerance for activity and assist level.      Follow Up Recommendations  SNF    Equipment Recommendations  Other (comment)(defer to next venue)    Recommendations for Other Services       Precautions / Restrictions Precautions Precautions: Fall Restrictions Weight Bearing Restrictions: Yes LLE Weight Bearing: Weight bearing as tolerated      Mobility Bed Mobility Overal bed mobility: Needs Assistance Bed Mobility: Supine to Sit     Supine to sit: Max assist;HOB elevated     General bed mobility comments: pain with LLE  movement. cues for sequencing. Max A for all aspects. Noted diifficulty advancing RLE as well as LLE  Transfers Overall transfer level: Needs assistance Equipment used: Rolling walker (2 wheeled) Transfers: Sit to/from [facility] Sit to Stand: +2 physical assistance;Max assist Stand pivot transfers: Max assist;+2 physical assistance       General transfer comment: transferred to R side. Assist for all aspects. Multimodal cues for hand placement and sequencing. Pt leaning to right side in attempt to guard LLE.     Balance Overall balance assessment: Needs assistance;History of Falls Sitting-balance support: Bilateral upper extremity supported;Feet supported Sitting balance-Leahy Scale: Fair   Postural control: Right lateral lean Standing balance support: Bilateral upper extremity supported Standing balance-Leahy Scale: Poor Standing balance comment: rw and physical assist for balance. Leans right to guard LLE                           ADL either performed or assessed with clinical judgement   ADL Overall ADL's : Needs assistance/impaired Eating/Feeding: Set up;Sitting   Grooming: Set up;Sitting;Minimal assistance   Upper Body Bathing: Sitting;Maximal assistance   Lower Body Bathing: Maximal assistance;Sit to/from stand;+2 for physical assistance   Upper Body Dressing : Maximal assistance;Sitting   Lower Body Dressing: Maximal assistance;+2 for physical assistance;Sit to/from stand   Toilet Transfer: Maximal assistance;+2 for physical assistance;Stand-pivot;BSC;RW Toilet Transfer Details (indicate cue type and reason): simulated with EOB>recliner Toileting- Clothing Manipulation and Hygiene: Maximal assistance;+2 for physical assistance;Sit to/from stand         General  ADL Comments: Pt completed bed mobility and simulated toilet transfer as detailed above.     Vision         Perception     Praxis      Pertinent Vitals/Pain Pain  Assessment: Faces Faces Pain Scale: Hurts whole lot Pain Location: L hip with movement Pain Descriptors / Indicators: Guarding;Grimacing;Moaning;Sore Pain Intervention(s): Premedicated before session;Limited activity within patient's tolerance;Monitored during session;Repositioned;Ice applied     Hand Dominance     Extremity/Trunk Assessment Upper Extremity Assessment Upper Extremity Assessment: Generalized weakness   Lower Extremity Assessment Lower Extremity Assessment: Defer to PT evaluation   Cervical / Trunk Assessment Cervical / Trunk Assessment: Kyphotic(forward flexed posture)   Communication Communication Communication: No difficulties   Cognition Arousal/Alertness: Awake/alert Behavior During Therapy: WFL for tasks assessed/performed(talkative) Overall Cognitive Status: No family/caregiver present to determine baseline cognitive functioning                                 General Comments: some slow processing. decreased STM. likely at basline.   General Comments       Exercises     Shoulder Instructions      Home Living Family/patient expects to be discharged to:: Private residence Living Arrangements: Other relatives(grandaughter and her husband + other family members) Available Help at Discharge: Family;Available 24 hours/day Type of Home: House Home Access: Ramped entrance     Home Layout: One level     Bathroom Shower/Tub: Tub/shower unit         Home Equipment: Clinical cytogeneticist - 2 wheels          Prior Functioning/Environment Level of Independence: Needs assistance  Gait / Transfers Assistance Needed: pt reports she is "suppose to use a walker" ADL's / Homemaking Assistance Needed: likely at least supervision for mobility and shower transfers            OT Problem List: Decreased strength;Decreased activity tolerance;Impaired balance (sitting and/or standing);Decreased knowledge of use of DME or AE;Decreased knowledge  of precautions;Pain      OT Treatment/Interventions: Self-care/ADL training;DME and/or AE instruction;Therapeutic activities;Patient/family education;Balance training    OT Goals(Current goals can be found in the care plan section) Acute Rehab OT Goals Patient Stated Goal: to go home. "I'm not going to a nursing home." OT Goal Formulation: With patient Time For Goal Achievement: 12/06/18 Potential to Achieve Goals: Good ADL Goals Pt Will Perform Lower Body Bathing: with mod assist;sit to/from stand Pt Will Perform Lower Body Dressing: with mod assist;sit to/from stand Pt Will Transfer to Toilet: with mod assist;ambulating;stand pivot transfer;bedside commode Pt Will Perform Toileting - Clothing Manipulation and hygiene: with mod assist;sit to/from stand Additional ADL Goal #1: Pt will complete bed mobility with min A to prepare for OOB ADLs  OT Frequency: Min 3X/week   Barriers to D/C:            Co-evaluation PT/OT/SLP Co-Evaluation/Treatment: Yes Reason for Co-Treatment: For patient/therapist safety;To address functional/ADL transfers   OT goals addressed during session: ADL's and self-care      AM-PAC OT "6 Clicks" Daily Activity     Outcome Measure Help from another person eating meals?: None Help from another person taking care of personal grooming?: A Little Help from another person toileting, which includes using toliet, bedpan, or urinal?: Total Help from another person bathing (including washing, rinsing, drying)?: A Lot Help from another person to put on and taking off regular upper body clothing?:  A Lot Help from another person to put on and taking off regular lower body clothing?: Total 6 Click Score: 13   End of Session Equipment Utilized During Treatment: Gait belt;Rolling walker  Activity Tolerance: Patient limited by pain;Other (comment);Patient limited by fatigue(lightheaded "I feel like I'm going to pass out.") Patient left: in chair;with call bell/phone  within reach;with chair alarm set  OT Visit Diagnosis: Unsteadiness on feet (R26.81);Pain;Muscle weakness (generalized) (M62.81);History of falling (Z91.81) Pain - Right/Left: Left Pain - part of body: Hip                Time: 5830-7460 OT Time Calculation (min): 34 min Charges:  OT General Charges $OT Visit: 1 Visit OT Evaluation $OT Eval Moderate Complexity: [place], OT Acute Rehabilitation Services Pager: [phone] Office: [phone]   Hortencia Pilar 11/22/2018, 11:07 AM

## 2018-11-22 NOTE — Progress Notes (Signed)
Paged by nursing staff about [name]'s concern about going home. Spoke with [name] via bedside phone. She states she spoke with her family members and was told that she will be discharged today. However, she noted that she is having difficulty with ambulation while getting prepped for discharge and states she would feel more comfortable being at the hospital. Discussed our formal recommendation for her to be discharged to a skilled nursing facility for short term rehab and explained that SNF stay would be temporary. However, she is adamant that she would rather be discharged home as she has multiple family members that can assist in caring for her. She states she is just unable to move as well as she thought could. Recommended continued inpatient admission to monitor blood count and ensure she is safe for home discharge. [name] expressed understanding and is agreement with the plan.

## 2018-11-23 DIAGNOSIS — Z9889 Other specified postprocedural states: Secondary | ICD-10-CM

## 2018-11-23 DIAGNOSIS — D62 Acute posthemorrhagic anemia: Secondary | ICD-10-CM

## 2018-11-23 DIAGNOSIS — D500 Iron deficiency anemia secondary to blood loss (chronic): Secondary | ICD-10-CM

## 2018-11-23 HISTORY — DX: Acute posthemorrhagic anemia: D62

## 2018-11-23 LAB — PREPARE RBC (CROSSMATCH)

## 2018-11-23 LAB — GLUCOSE, CAPILLARY
Glucose-Capillary: 127 mg/dL — ABNORMAL HIGH (ref 70–99)
Glucose-Capillary: 150 mg/dL — ABNORMAL HIGH (ref 70–99)
Glucose-Capillary: 155 mg/dL — ABNORMAL HIGH (ref 70–99)
Glucose-Capillary: 135 mg/dL — ABNORMAL HIGH (ref 70–99)

## 2018-11-23 LAB — BASIC METABOLIC PANEL
Anion gap: 11 (ref 5–15)
BUN: 71 mg/dL — ABNORMAL HIGH (ref 8–23)
CO2: 20 mmol/L — ABNORMAL LOW (ref 22–32)
Calcium: 8.3 mg/dL — ABNORMAL LOW (ref 8.9–10.3)
Chloride: 104 mmol/L (ref 98–111)
Creatinine, Ser: 2.66 mg/dL — ABNORMAL HIGH (ref 0.44–1.00)
GFR calc Af Amer: 18 mL/min — ABNORMAL LOW (ref >60)
GFR calc non Af Amer: 15 mL/min — ABNORMAL LOW (ref >60)
Glucose, Bld: 113 mg/dL — ABNORMAL HIGH (ref 70–99)
Potassium: 4.6 mmol/L (ref 3.5–5.1)
Sodium: 135 mmol/L (ref 135–145)

## 2018-11-23 LAB — HEMOGLOBIN AND HEMATOCRIT, BLOOD
HCT: 21.7 % — ABNORMAL LOW (ref 36.0–46.0)
HCT: 23.4 % — ABNORMAL LOW (ref 36.0–46.0)
Hemoglobin: 7.4 g/dL — ABNORMAL LOW (ref 12.0–15.0)
Hemoglobin: 8.2 g/dL — ABNORMAL LOW (ref 12.0–15.0)

## 2018-11-23 LAB — CBC
HCT: 22 % — ABNORMAL LOW (ref 36.0–46.0)
Hemoglobin: 7.3 g/dL — ABNORMAL LOW (ref 12.0–15.0)
MCH: 30.8 pg (ref 26.0–34.0)
MCHC: 33.2 g/dL (ref 30.0–36.0)
MCV: 92.8 fL (ref 80.0–100.0)
Platelets: 148 10*3/uL — ABNORMAL LOW (ref 150–400)
RBC: 2.37 MIL/uL — ABNORMAL LOW (ref 3.87–5.11)
RDW: 13.5 % (ref 11.5–15.5)
WBC: 10.1 10*3/uL (ref 4.0–10.5)
nRBC: 0 % (ref 0.0–0.2)

## 2018-11-23 MED ORDER — SODIUM CHLORIDE 0.9% IV SOLUTION
Freq: Once | INTRAVENOUS | Status: AC
  Administered 2018-11-23: 15:00:00 via INTRAVENOUS

## 2018-11-23 NOTE — Plan of Care (Signed)
  Problem: Activity: Goal: Risk for activity intolerance will decrease Outcome: Progressing   Problem: Nutrition: Goal: Adequate nutrition will be maintained Outcome: Progressing   Problem: Coping: Goal: Level of anxiety will decrease Outcome: Progressing   Problem: Pain Managment: Goal: General experience of comfort will improve Outcome: Progressing   Problem: Skin Integrity: Goal: Risk for impaired skin integrity will decrease Outcome: Progressing   

## 2018-11-23 NOTE — Plan of Care (Signed)
Problem: Education: Goal: Knowledge of General Education information will improve Description: Including pain rating scale, medication(s)/side effects and non-pharmacologic comfort measures Outcome: Progressing   Problem: Health Behavior/Discharge Planning: Goal: Ability to manage health-related needs will improve Outcome: Progressing   Problem: Clinical Measurements: Goal: Ability to maintain clinical measurements within normal limits will improve Outcome: Progressing Goal: Will remain free from infection Outcome: Progressing Goal: Respiratory complications will improve Outcome: Progressing Goal: Cardiovascular complication will be avoided Outcome: Progressing   Problem: Activity: Goal: Risk for activity intolerance will decrease Outcome: Progressing   Problem: Nutrition: Goal: Adequate nutrition will be maintained Outcome: Progressing   Problem: Coping: Goal: Level of anxiety will decrease Outcome: Progressing   Problem: Elimination: Goal: Will not experience complications related to urinary retention Outcome: Progressing   Problem: Pain Managment: Goal: General experience of comfort will improve Outcome: Progressing   Problem: Skin Integrity: Goal: Risk for impaired skin integrity will decrease Outcome: Progressing

## 2018-11-23 NOTE — Progress Notes (Addendum)
Inpatient Rehabilitation-Admissions Coordinator   Per OT note as well as conversation with PT regarding treatment session today, pt is not a candidate for CIR at this time due to lack of tolerance. According to conversation with therapies this morning, pt agrees that she would not be able to tolerate it either. Per notes, it appears family is prepared to take her home with [facility] MD aware.   Jhonnie Garner, OTR/L  Rehab Admissions Coordinator  [phone] 11/23/2018 11:49 AM

## 2018-11-23 NOTE — Progress Notes (Signed)
Physical Therapy Treatment Patient Details Name: [name] MRN: [id number] DOB: January 14, 1926 Today's Date: 11/23/2018    History of Present Illness Pt is a 85 y.o. female admitted 11/20/18 after fall at home sustaining L hip fx. Now s/p L femoral ORIF 6/25. PMH includes CKD V, HTN, DM.   PT Comments    Pt slowly progressing with mobility. Able to stand and take a few steps with RW and min-modA+2; pt limited by LLE pain, only able to take a few pivotal steps. Able to tolerate some L hip/knee PROM. Continues to decline SNF; pt agrees with PT that she could not tolerate intensive CIR-level therapies. Spoke with graundaughter on phone to discuss equipment and assist needs; she reports family can provide 24/7 physical assist and own all necessary DME, including BSC and w/c. Will continue to follow acutely.   Follow Up Recommendations  Home health PT;Supervision/Assistance - 24 hour(pt and family declined SNF)     Equipment Recommendations  None recommended by PT    Recommendations for Other Services       Precautions / Restrictions Precautions Precautions: Fall Restrictions Weight Bearing Restrictions: Yes LLE Weight Bearing: Weight bearing as tolerated    Mobility  Bed Mobility Overal bed mobility: Needs Assistance Bed Mobility: Supine to Sit     Supine to sit: Max assist;HOB elevated     General bed mobility comments: Requiring assistance for managing LLE as well as assist for bringing hips towards EOB with pad and elevating trunk.  Transfers Overall transfer level: Needs assistance Equipment used: Rolling walker (2 wheeled) Transfers: Sit to/from Stand Sit to Stand: Mod assist;+2 safety/equipment;From elevated surface Stand pivot transfers: Min assist;+2 physical assistance       General transfer comment: ModA to assist trunk elevation; cues for correct hand placement  Ambulation/Gait Ambulation/Gait assistance: +2 safety/equipment;Mod assist Gait Distance (Feet):  3 Feet Assistive device: Rolling walker (2 wheeled) Gait Pattern/deviations: Step-to pattern;Decreased weight shift to right;Leaning posteriorly;Antalgic;Trunk flexed Gait velocity: Decreased Gait velocity interpretation: <1.31 ft/sec, indicative of household ambulator General Gait Details: Able to take antalgic steps with RW and intermittent modA for balance; assist to manage RW. Improved ability to take step with LLE this session   Stairs             Wheelchair Mobility    Modified Rankin (Stroke Patients Only)       Balance Overall balance assessment: Needs assistance;History of Falls Sitting-balance support: Bilateral upper extremity supported;Feet supported Sitting balance-Leahy Scale: Fair   Postural control: Right lateral lean Standing balance support: Bilateral upper extremity supported Standing balance-Leahy Scale: Poor Standing balance comment: rw and physical assist for balance. Leans right to guard LLE                            Cognition Arousal/Alertness: Awake/alert Behavior During Therapy: WFL for tasks assessed/performed Overall Cognitive Status: Within Functional Limits for tasks assessed                                 General Comments: some slow processing. decreased STM. likely at basline.      Exercises General Exercises - Lower Extremity Long Arc Quad: AAROM;Left;Seated    General Comments General comments (skin integrity, edema, etc.): Spoke with granddaughter Sim Boast) on phone to confirm family can provide necessary assist, type of transfers, DME. SpO2 95% on RA      Pertinent Vitals/Pain Pain  Assessment: Faces Faces Pain Scale: Hurts whole lot Pain Location: L hip with movement Pain Descriptors / Indicators: Aching;Discomfort;Operative site guarding;Guarding Pain Intervention(s): Monitored during session    Home Living                      Prior Function            PT Goals (current goals can  now be found in the care plan section) Acute Rehab PT Goals Patient Stated Goal: to go home. "I'm not going to a nursing home." PT Goal Formulation: With patient Time For Goal Achievement: 11/26/18 Potential to Achieve Goals: Fair Progress towards PT goals: Progressing toward goals    Frequency    Min 5X/week      PT Plan Current plan remains appropriate    Co-evaluation PT/OT/SLP Co-Evaluation/Treatment: Yes Reason for Co-Treatment: For patient/therapist safety;To address functional/ADL transfers PT goals addressed during session: Mobility/safety with mobility;Balance;Proper use of DME OT goals addressed during session: ADL's and self-care      AM-PAC PT "6 Clicks" Mobility   Outcome Measure  Help needed turning from your back to your side while in a flat bed without using bedrails?: A Lot Help needed moving from lying on your back to sitting on the side of a flat bed without using bedrails?: A Lot Help needed moving to and from a bed to a chair (including a wheelchair)?: A Lot Help needed standing up from a chair using your arms (e.g., wheelchair or bedside chair)?: A Lot Help needed to walk in hospital room?: A Lot Help needed climbing 3-5 steps with a railing? : Total 6 Click Score: 11    End of Session Equipment Utilized During Treatment: Gait belt Activity Tolerance: Patient tolerated treatment well Patient left: in chair;with chair alarm set;with call bell/phone within reach Nurse Communication: Mobility status PT Visit Diagnosis: Unsteadiness on feet (R26.81);Muscle weakness (generalized) (M62.81);Difficulty in walking, not elsewhere classified (R26.2);Pain;Repeated falls (R29.6) Pain - Right/Left: Left Pain - part of body: Hip     Time: 1020-1046 PT Time Calculation (min) (ACUTE ONLY): 26 min  Charges:  $Therapeutic Activity: 8-22 mins                    Mabeline Caras, PT, DPT Acute Rehabilitation Services  Pager [phone] Office  [phone]  Derry Lory 11/23/2018, 12:24 PM

## 2018-11-23 NOTE — Progress Notes (Signed)
1754: RN attempted to call pt's granddaughter for d/c tonight. Unsuccessful at this time. Will try again later. Will continue to monitor.  1827: RN attempted to call pt's granddaughter for d/c tonight. Unsuccessful at this time. Gilberto Better, MD aware. Will continue to monitor.

## 2018-11-23 NOTE — Progress Notes (Signed)
    Subjective: Patient reports pain as mild to moderate.  Tolerating diet - hungry.  Decent early mobilization.  Goal is to return home with home health.  Denies dizziness, lightheadedness, or weakness.  Objective:   VITALS:   Vitals:   11/23/18 0539 11/23/18 0800 11/23/18 0843 11/23/18 1007  BP: (!) 142/61 (!) 146/48 (!) 145/52 (!) 125/52  Pulse: 60 65 65   Resp: 12 17    Temp: 98.5 F (36.9 C) 97.9 F (36.6 C)    TempSrc: Oral Oral    SpO2: 98% 99%    Weight:      Height:       CBC Latest Ref Rng & Units 11/23/2018 11/23/2018 11/22/2018  WBC 4.0 - 10.5 K/uL - 10.1 -  Hemoglobin 12.0 - 15.0 g/dL 7.4(L) 7.3(L) 8.3(L)  Hematocrit 36.0 - 46.0 % 21.7(L) 22.0(L) 24.6(L)  Platelets 150 - 400 K/uL - 148(L) -   BMP Latest Ref Rng & Units 11/23/2018 11/22/2018 11/21/2018  Glucose 70 - 99 mg/dL 113(H) 173(H) 150(H)  BUN 8 - 23 mg/dL 71(H) 64(H) 66(H)  Creatinine 0.44 - 1.00 mg/dL 2.66(H) 2.41(H) 2.39(H)  BUN/Creat Ratio 12 - 28 - - -  Sodium 135 - 145 mmol/L 135 136 137  Potassium 3.5 - 5.1 mmol/L 4.6 4.0 4.4  Chloride 98 - 111 mmol/L 104 106 106  CO2 22 - 32 mmol/L 20(L) 21(L) 21(L)  Calcium 8.9 - 10.3 mg/dL 8.3(L) 8.2(L) 9.2   Intake/Output      06/26 0701 - 06/27 0700 06/27 0701 - 06/28 0700   P.O. 660    I.V. (mL/kg)     Other     IV Piggyback     Total Intake(mL/kg) 660 (11.2)    Urine (mL/kg/hr) 400 (0.3)    Blood     Total Output 400    Net +260         Urine Occurrence 2 x       Physical Exam: General: NAD.  Upright in chair with lunch.  Calm, conversant. MSK LLE:  Neurovascularly intact Sensation intact distally Feet warm Dorsiflexion/Plantar flexion intact Incision: dressing C/D/I, changed.   Assessment: 2 Days Post-Op  S/P Procedure(s) (LRB): OPEN REDUCTION INTERNAL FIXATION  OF THE LEFT HIP (Left)   Principal Problem:   Closed left hip fracture, initial encounter [facility]) Active Problems:   Atrial fibrillation ([place])   Fall   Essential  hypertension, benign   Diabetes mellitus without complication (HCC)   CKD (chronic kidney disease), stage V (HCC)   Postoperative anemia due to acute blood loss   Left intertrochanteric femur fracture, status post DHS sliding hip screw fixation Doing well Good early mobilization Pain controlled  ABLA: hemoglobin down slightly today but stable since 5 AM draw.  Denies symptoms.  Plan: Up with therapy Incentive Spirometry Apply ice PRN  Weightbearing: WBAT LLE Insicional and dressing care: New dressing today.  Change dressing PRN Showering: Keep dressing dry VTE prophylaxis: Eliquis resumed.  SCDs, ambulation Pain control: Continue current regimen Follow - up plan: 2 weeks Contact information:  Edmonia Lynch MD, Roxan Hockey PA-C  Dispo: Patient declines SNF.  Planning for home health PT 24/7 family support.  Okay to discharge when ready medically.   Charna Elizabeth Martensen III, PA-C 11/23/2018, 1:09 PM

## 2018-11-23 NOTE — Progress Notes (Signed)
Occupational Therapy Treatment Patient Details Name: [name] MRN: [id number] DOB: 03-07-1926 Today's Date: 11/23/2018    History of present illness 85 year old woman with chronic heart failure with moderately reduced ejection fraction, CKD stage V, hypertension and diabetes admitted after a fall at home which has resulted in a left hip fracture. Now s/p Open reduction internal fixation of left intertrochanteric femur fracture.    OT comments  Pt progressing towards established OT goals and demonstrating increased activity tolerance this session. Pt requiring Max A +2 for bed mobility and limited by pain. Pt performing functional transfers with Min-Mod A +2 and RW. Pt calling her granddaughter at end of session and she confirms they will provide physical support as well as confirming DME at home. Pt continues to fatigue quickly and feel she would not tolerate intense therapy at CIR. Update dc recommendation to home with HHOT and will continue to follow acutely as admitted.   Follow Up Recommendations  Home health OT;Supervision/Assistance - 24 hour(Family confirming they will provide physical support)    Equipment Recommendations  None recommended by OT(Pt has w/c and 3N1 at home)    Recommendations for Other Services      Precautions / Restrictions Precautions Precautions: Fall Restrictions Weight Bearing Restrictions: Yes LLE Weight Bearing: Weight bearing as tolerated       Mobility Bed Mobility Overal bed mobility: Needs Assistance Bed Mobility: Supine to Sit     Supine to sit: Max assist;+2 for safety/equipment;+2 for physical assistance;HOB elevated     General bed mobility comments: Requiring assistance for managing LLE as well as assist for bringing hips towards EOB with pad and elevating trunk.  Transfers Overall transfer level: Needs assistance Equipment used: Rolling walker (2 wheeled) Transfers: Sit to/from [facility] Sit to Stand: Mod  assist;+2 safety/equipment;From elevated surface Stand pivot transfers: Min assist;+2 physical assistance       General transfer comment: Mod A to power up into standing and then Min A for pivot towards recliner. Assist for stability and RW management during pivot    Balance Overall balance assessment: Needs assistance;History of Falls Sitting-balance support: Bilateral upper extremity supported;Feet supported Sitting balance-Leahy Scale: Fair   Postural control: Right lateral lean Standing balance support: Bilateral upper extremity supported Standing balance-Leahy Scale: Poor Standing balance comment: rw and physical assist for balance. Leans right to guard LLE                           ADL either performed or assessed with clinical judgement   ADL Overall ADL's : Needs assistance/impaired                     Lower Body Dressing: Maximal assistance;+2 for physical assistance;Sit to/from stand Lower Body Dressing Details (indicate cue type and reason): Max A for donning socks Toilet Transfer: +2 for physical assistance;Stand-pivot;RW;Moderate assistance;Minimal assistance(Simulated to recliner) Toilet Transfer Details (indicate cue type and reason): Pt performing stand pivot with Min-Mod A +2 demonstrating increased balance and activity tolerance this session         Functional mobility during ADLs: Minimal assistance;Moderate assistance;+2 for physical assistance;Rolling walker General ADL Comments: Pt demonstrating increased activity tolerance and balance this session.      Vision       Perception     Praxis      Cognition Arousal/Alertness: Awake/alert Behavior During Therapy: WFL for tasks assessed/performed Overall Cognitive Status: Within Functional Limits for tasks assessed  General Comments: some slow processing. decreased STM. likely at basline.        Exercises     Shoulder Instructions        General Comments SpO2 96-95% on RA. Pt calling her granddaughter Sim Boast who confirms she plans to provide physical support at home.    Pertinent Vitals/ Pain       Pain Assessment: Faces Faces Pain Scale: Hurts whole lot Pain Location: L hip with movement Pain Descriptors / Indicators: Aching;Discomfort;Operative site guarding;Guarding Pain Intervention(s): Monitored during session;Limited activity within patient's tolerance;Repositioned  Home Living                                          Prior Functioning/Environment              Frequency  Min 3X/week        Progress Toward Goals  OT Goals(current goals can now be found in the care plan section)  Progress towards OT goals: Progressing toward goals  Acute Rehab OT Goals Patient Stated Goal: to go home. "I'm not going to a nursing home." OT Goal Formulation: With patient Time For Goal Achievement: 12/06/18 Potential to Achieve Goals: Good ADL Goals Pt Will Perform Lower Body Bathing: with mod assist;sit to/from stand Pt Will Perform Lower Body Dressing: with mod assist;sit to/from stand Pt Will Transfer to Toilet: with mod assist;ambulating;stand pivot transfer;bedside commode Pt Will Perform Toileting - Clothing Manipulation and hygiene: with mod assist;sit to/from stand Additional ADL Goal #1: Pt will complete bed mobility with min A to prepare for OOB ADLs  Plan Discharge plan needs to be updated    Co-evaluation    PT/OT/SLP Co-Evaluation/Treatment: Yes Reason for Co-Treatment: For patient/therapist safety;To address functional/ADL transfers   OT goals addressed during session: ADL's and self-care      AM-PAC OT "6 Clicks" Daily Activity     Outcome Measure   Help from another person eating meals?: None Help from another person taking care of personal grooming?: A Little Help from another person toileting, which includes using toliet, bedpan, or urinal?: A Lot Help from  another person bathing (including washing, rinsing, drying)?: A Lot Help from another person to put on and taking off regular upper body clothing?: A Lot Help from another person to put on and taking off regular lower body clothing?: A Lot 6 Click Score: 15    End of Session Equipment Utilized During Treatment: Gait belt;Rolling walker  OT Visit Diagnosis: Unsteadiness on feet (R26.81);Pain;Muscle weakness (generalized) (M62.81);History of falling (Z91.81) Pain - Right/Left: Left Pain - part of body: Hip   Activity Tolerance Patient limited by pain;Patient limited by fatigue;Patient tolerated treatment well   Patient Left in chair;with call bell/phone within reach;with chair alarm set   Nurse Communication Mobility status        Time: 6503-5465 OT Time Calculation (min): 33 min  Charges: OT General Charges $OT Visit: 1 Visit OT Treatments $Self Care/Home Management : 8-22 mins  [place], OTR/L Acute Rehab Pager: [phone] Office: [place] 11/23/2018, 11:05 AM

## 2018-11-23 NOTE — Progress Notes (Signed)
Pt's granddaughter updated of pt status. All questions answered to satisfaction. Will continue to monitor.

## 2018-11-23 NOTE — Progress Notes (Signed)
Rehab Admissions Coordinator Note:  Received call from Dr. Truman Hayward this AM regarding pt request for IP rehab. This pt was screened by Jhonnie Garner for appropriateness for an Inpatient Acute Rehab Consult.  At this time, [facility] will follow for one additional treatment session to see if pt able to tolerate increased activity today that would demonstrate she can handle 3hr/day of therapy at CIR. AC has contacted PT and OT regarding treatment session today. Will follow for their note prior to making a recommendation.  Jhonnie Garner, OTR/L  Rehab Admissions Coordinator  [phone] 11/23/2018 9:49 AM

## 2018-11-23 NOTE — Progress Notes (Addendum)
Discussed with Ms.Rittenberry regarding her inability to qualify for CRI and improvement in mobility when working with our therapists. Plan for discharge after transfusion if stable. She expressed that she would like do whatever her family wants her to do.  Discussed with Ms.Earle's granddaughter, Arville Go, regarding Ms.Earle's improvement with ambulation and our therapist's recommendation for discharge home with home health. Discussed that  Ms.Brewer states she was able to speak to the therapists and states she has taken care of Ms.Wixted after her previous femur fracture and has significant experience in taking care of her at home. Discussed risks of poor supervision at home, including recurrent falls and high bleeding risk. She states she has all the necessary equipment at home, including medical bed, and is optimistic that she will be able to keep her safe. Discussed current plan for blood transfusion for symptomatic anemia and being medically cleared for discharge post-transfusion if no transfusion associated complications occur. Discussed plan for discharge today. Ms.Brewer mentions that due to family event, patient may not be able to be picked up until tomorrow morning. Will plan for discharge but she may need overnight stay.

## 2018-11-23 NOTE — Progress Notes (Signed)
   Subjective:  [name] is a 85 y.o. with PMH of HFrEF 35-40%, A-fib on Eliquis, HTN, T2DM, HLD, chronic hypoxic respiratory admit for fracture on hospital day 3  [name] was examined and evaluated at bedside this am. She states she is feeling more fatigued this morning. Denies any chest pain, dyspnea, nausea, vomiting. When asked about her request to go home, she mentions that she refuses to go to a rehab facility due to concerns for catching COVID-19. She also states that she has great grandchildren that she would like to see because they are the 'joy of her life.' Discussed plan to continue to monitor inpatient as her hemoglobin has been dropping. [name] expressed understanding.  Objective:  Vital signs in last 24 hours: Vitals:   11/22/18 1945 11/22/18 2016 11/23/18 0500 11/23/18 0539  BP: (!) 129/51   (!) 142/61  Pulse: (!) 121 92  60  Resp: 16   12  Temp: 99 F (37.2 C)   98.5 F (36.9 C)  TempSrc: Oral   Oral  SpO2: 98%   98%  Weight:   59 kg   Height:       Gen: Well-developed, NAD CV: Tachycardic, regular rhythm, S1, S2 normal, No rubs, no murmurs, no gallops Pulm: CTAB, No rales, no wheezes Extm: Peripheral pulses intact, No peripheral edema Skin: Dry, Warm, normal turgor, surgical wound on left thigh with clean dressing without obvious drainage, erythema or edema. Still tender Neuro: AAOx3  Assessment/Plan:  Principal Problem:   Closed left hip fracture, initial encounter [facility]) Active Problems:   Atrial fibrillation ([place])   Fall   Essential hypertension, benign   Diabetes mellitus without complication (HCC)   CKD (chronic kidney disease), stage V ([place])  [name] is a 85 yo F w/ PMH of HFrEF (35-40%), Afib on Eliquis, HTN, T2DM, HLD chronic hypoxic respiratory failure on 3L [place] admit for left hip fracture. She is post op day 2 and is in good spirits but endorsing fatigue. Hemoglobin has been trending down since surgery. Would like to see it stabilize.  Dispo still a concern. Pt changed her mind and is now requesting CIR. Will reach out to [facility] admission coordinator for eligibility criteria.  Left Intertrochanteric fracture: Post op day 2. Surgical site appears without drainage or tenderness. Pt still requires 2 people assist for ambulation. -Appreciate Orthopedics recs: cleared for discharge -Oxy IR PRN for pain -Bowel regimen continued -PT/OT: SNF -Social work mentions family able to provide 24 hr supervision and would like d/c home.  Blood Loss Anemia Hgb 10->8.2->8.3->7.3 this AM. No obvious bleeding site on exam. Post op day 2. Eliquis restarted yesterday. Vitals stable. Thigh pain about the same per pt.  - Repeat H&H - Transfuse if <7  HFrEF: Continues to appear euvolemic on exam -Continue coreg, dilt, hydralazine, imdur, furosemide 40mg  daily -Continue weights and strict I/O's  Atrial Fibrillation: Anticoagulation started - Will need to stop if hemoglobin continues to decline - C/w Coreg and diltiazem.  DVT prophx: Eliquis Diet: Cardiac Code: DNR DVT prophx:  Dispo: Anticipated discharge in approximately 1-2 day(s).   Mosetta Anis, MD 11/23/2018, 7:12 AM Pager: [phone]

## 2018-11-24 LAB — GLUCOSE, CAPILLARY: Glucose-Capillary: 114 mg/dL — ABNORMAL HIGH (ref 70–99)

## 2018-11-24 NOTE — Progress Notes (Signed)
Provided verbal discharge instructions to daughter Toya Smothers via telephone. Pt given oral and written discharge instructions at bedside. No further questions. Pt will eat breakfast and then discharge at 0900 when daughter arrives for transport.

## 2018-11-24 NOTE — Plan of Care (Signed)
  Problem: Education: Goal: Knowledge of General Education information will improve Description Including pain rating scale, medication(s)/side effects and non-pharmacologic comfort measures Outcome: Progressing   Problem: Health Behavior/Discharge Planning: Goal: Ability to manage health-related needs will improve Outcome: Progressing   

## 2018-11-24 NOTE — Progress Notes (Signed)
Pt assisted to family vehicle for discharge. Has AVS and all belongings.

## 2018-11-24 NOTE — Progress Notes (Signed)
    Subjective: Patient reports pain as mild.  Tolerating diet.  Decent early mobilization.  Planning for discharge to home in care of family today.  Denies dizziness, lightheadedness, or weakness.  Objective:   VITALS:   Vitals:   11/23/18 1758 11/23/18 1944 11/24/18 0416 11/24/18 0500  BP: (!) 160/62 (!) 146/56 120/68   Pulse: 86 88 70   Resp: 18 18 16    Temp: 98.3 F (36.8 C) 98.4 F (36.9 C) 98.3 F (36.8 C)   TempSrc: Oral Oral Oral   SpO2: 96% 96% 92%   Weight:    58.8 kg  Height:       CBC Latest Ref Rng & Units 11/23/2018 11/23/2018 11/23/2018  WBC 4.0 - 10.5 K/uL - - 10.1  Hemoglobin 12.0 - 15.0 g/dL 8.2(L) 7.4(L) 7.3(L)  Hematocrit 36.0 - 46.0 % 23.4(L) 21.7(L) 22.0(L)  Platelets 150 - 400 K/uL - - 148(L)   BMP Latest Ref Rng & Units 11/23/2018 11/22/2018 11/21/2018  Glucose 70 - 99 mg/dL 113(H) 173(H) 150(H)  BUN 8 - 23 mg/dL 71(H) 64(H) 66(H)  Creatinine 0.44 - 1.00 mg/dL 2.66(H) 2.41(H) 2.39(H)  BUN/Creat Ratio 12 - 28 - - -  Sodium 135 - 145 mmol/L 135 136 137  Potassium 3.5 - 5.1 mmol/L 4.6 4.0 4.4  Chloride 98 - 111 mmol/L 104 106 106  CO2 22 - 32 mmol/L 20(L) 21(L) 21(L)  Calcium 8.9 - 10.3 mg/dL 8.3(L) 8.2(L) 9.2   Intake/Output      06/27 0701 - 06/28 0700 06/28 0701 - 06/29 0700   P.O. 480    I.V. (mL/kg) 10 (0.2)    Blood 315    Total Intake(mL/kg) 805 (13.7)    Urine (mL/kg/hr) 400 (0.3)    Total Output 400    Net +405            Physical Exam: General: NAD.  Upright in bed.  Calm, conversant. MSK LLE:  Neurovascularly intact Sensation intact distally Feet warm Dorsiflexion/Plantar flexion intact Incision: dressing C/D/I.   Assessment: 3 Days Post-Op  S/P Procedure(s) (LRB): OPEN REDUCTION INTERNAL FIXATION  OF THE LEFT HIP (Left)   Principal Problem:   Closed left hip fracture, initial encounter [facility]) Active Problems:   Atrial fibrillation ([place])   Fall   Essential hypertension, benign   Diabetes mellitus without complication  (HCC)   CKD (chronic kidney disease), stage V (HCC)   Postoperative anemia due to acute blood loss   Left intertrochanteric femur fracture, status post DHS sliding hip screw fixation Doing well Good early mobilization. Pain controlled  ABLA: hemoglobin to 8.2<7.4.  She received 1 unit PRBC 11/23/18.  Denies symptoms.  Plan: Mobilize Incentive Spirometry Apply ice PRN  Weightbearing: WBAT LLE Insicional and dressing care:  Change dressing PRN Showering: Keep dressing dry VTE prophylaxis: Eliquis resumed.  SCDs, ambulation Pain control: Continue current regimen.  Minimize narcotics. Follow - up plan: 2 weeks Contact information:  Edmonia Lynch MD, Roxan Hockey PA-C  Dispo: Patient and family declines SNF.  Planning for discharge home today with home health PT, 24/7 family support.     Charna Elizabeth Martensen III, PA-C 11/24/2018, 7:28 AM

## 2018-11-25 LAB — TYPE AND SCREEN
ABO/RH(D): A NEG
Antibody Screen: NEGATIVE
Unit division: 0
Unit division: 0

## 2018-11-25 LAB — BPAM RBC
Blood Product Expiration Date: 202007122359
Blood Product Expiration Date: 202007132359
ISSUE DATE / TIME: 202006271503
Unit Type and Rh: 600
Unit Type and Rh: 600

## 2018-12-01 ENCOUNTER — Emergency Department (HOSPITAL_COMMUNITY)
Admission: EM | Admit: 2018-12-01 | Discharge: 2018-12-02 | Disposition: A | Discharge status: Home / Self Care | Payer: Medicare Other | Attending: Emergency Medicine | Admitting: Emergency Medicine

## 2018-12-01 ENCOUNTER — Other Ambulatory Visit: Payer: Self-pay

## 2018-12-01 ENCOUNTER — Encounter (HOSPITAL_COMMUNITY): Payer: Self-pay | Admitting: *Deleted

## 2018-12-01 DIAGNOSIS — K5903 Drug induced constipation: Secondary | ICD-10-CM | POA: Diagnosis not present

## 2018-12-01 DIAGNOSIS — I132 Hypertensive heart and chronic kidney disease with heart failure and with stage 5 chronic kidney disease, or end stage renal disease: Secondary | ICD-10-CM | POA: Insufficient documentation

## 2018-12-01 DIAGNOSIS — Z96652 Presence of left artificial knee joint: Secondary | ICD-10-CM | POA: Insufficient documentation

## 2018-12-01 DIAGNOSIS — I5022 Chronic systolic (congestive) heart failure: Secondary | ICD-10-CM | POA: Diagnosis not present

## 2018-12-01 DIAGNOSIS — R1084 Generalized abdominal pain: Secondary | ICD-10-CM

## 2018-12-01 DIAGNOSIS — Z853 Personal history of malignant neoplasm of breast: Secondary | ICD-10-CM | POA: Insufficient documentation

## 2018-12-01 DIAGNOSIS — Z7901 Long term (current) use of anticoagulants: Secondary | ICD-10-CM | POA: Insufficient documentation

## 2018-12-01 DIAGNOSIS — Z96651 Presence of right artificial knee joint: Secondary | ICD-10-CM | POA: Diagnosis not present

## 2018-12-01 DIAGNOSIS — N185 Chronic kidney disease, stage 5: Secondary | ICD-10-CM | POA: Insufficient documentation

## 2018-12-01 DIAGNOSIS — E1122 Type 2 diabetes mellitus with diabetic chronic kidney disease: Secondary | ICD-10-CM | POA: Insufficient documentation

## 2018-12-01 DIAGNOSIS — R1030 Lower abdominal pain, unspecified: Secondary | ICD-10-CM | POA: Diagnosis present

## 2018-12-01 LAB — CBC WITH DIFFERENTIAL/PLATELET
Abs Immature Granulocytes: 0.13 10*3/uL — ABNORMAL HIGH (ref 0.00–0.07)
Basophils Absolute: 0 10*3/uL (ref 0.0–0.1)
Basophils Relative: 0 %
Eosinophils Absolute: 0 10*3/uL (ref 0.0–0.5)
Eosinophils Relative: 0 %
HCT: 28.3 % — ABNORMAL LOW (ref 36.0–46.0)
Hemoglobin: 9.3 g/dL — ABNORMAL LOW (ref 12.0–15.0)
Immature Granulocytes: 1 %
Lymphocytes Relative: 3 %
Lymphs Abs: 0.4 10*3/uL — ABNORMAL LOW (ref 0.7–4.0)
MCH: 30.6 pg (ref 26.0–34.0)
MCHC: 32.9 g/dL (ref 30.0–36.0)
MCV: 93.1 fL (ref 80.0–100.0)
Monocytes Absolute: 1.1 10*3/uL — ABNORMAL HIGH (ref 0.1–1.0)
Monocytes Relative: 7 %
Neutro Abs: 12.9 10*3/uL — ABNORMAL HIGH (ref 1.7–7.7)
Neutrophils Relative %: 89 %
Platelets: 433 10*3/uL — ABNORMAL HIGH (ref 150–400)
RBC: 3.04 MIL/uL — ABNORMAL LOW (ref 3.87–5.11)
RDW: 14.2 % (ref 11.5–15.5)
WBC: 14.6 10*3/uL — ABNORMAL HIGH (ref 4.0–10.5)
nRBC: 0 % (ref 0.0–0.2)

## 2018-12-01 LAB — COMPREHENSIVE METABOLIC PANEL
ALT: 9 U/L (ref 0–44)
AST: 17 U/L (ref 15–41)
Albumin: 3.3 g/dL — ABNORMAL LOW (ref 3.5–5.0)
Alkaline Phosphatase: 83 U/L (ref 38–126)
Anion gap: 14 (ref 5–15)
BUN: 96 mg/dL — ABNORMAL HIGH (ref 8–23)
CO2: 21 mmol/L — ABNORMAL LOW (ref 22–32)
Calcium: 9.1 mg/dL (ref 8.9–10.3)
Chloride: 100 mmol/L (ref 98–111)
Creatinine, Ser: 2.66 mg/dL — ABNORMAL HIGH (ref 0.44–1.00)
GFR calc Af Amer: 18 mL/min — ABNORMAL LOW (ref >60)
GFR calc non Af Amer: 15 mL/min — ABNORMAL LOW (ref >60)
Glucose, Bld: 170 mg/dL — ABNORMAL HIGH (ref 70–99)
Potassium: 4.5 mmol/L (ref 3.5–5.1)
Sodium: 135 mmol/L (ref 135–145)
Total Bilirubin: 0.7 mg/dL (ref 0.3–1.2)
Total Protein: 6.8 g/dL (ref 6.5–8.1)

## 2018-12-01 LAB — OCCULT BLOOD X 1 CARD TO LAB, STOOL: Fecal Occult Bld: NEGATIVE

## 2018-12-01 LAB — PROTIME-INR
INR: 1.7 — ABNORMAL HIGH (ref 0.8–1.2)
Prothrombin Time: 19.3 seconds — ABNORMAL HIGH (ref 11.4–15.2)

## 2018-12-01 LAB — LIPASE, BLOOD: Lipase: 23 U/L (ref 11–51)

## 2018-12-01 MED ORDER — FENTANYL CITRATE (PF) 100 MCG/2ML IJ SOLN
50.0000 ug | INTRAMUSCULAR | Status: DC | PRN
  Administered 2018-12-01: 23:00:00 50 ug via INTRAVENOUS
  Filled 2018-12-01: qty 2

## 2018-12-01 MED ORDER — SODIUM CHLORIDE 0.9 % IV BOLUS
250.0000 mL | Freq: Once | INTRAVENOUS | Status: AC
  Administered 2018-12-01: 250 mL via INTRAVENOUS

## 2018-12-01 NOTE — ED Provider Notes (Signed)
[facility] DEPT Provider Note   CSN: [id number] Arrival date & time: 12/01/18  2146    History   Chief Complaint Chief Complaint  Patient presents with  . Abdominal Pain    constipation    HPI [name] is a 85 y.o. female with a hx of breast cancer, cardiomyopathy, systolic heart failure, CKD, hypertension, atrial fibrillation anticoagulated on Eliquis, presents to the Emergency Department complaining of gradual, persistent, progressively worsening lower abd pain onset this evening.  Pt reports feeling the need to have a BM but was unable to accomplish this at home.  Pt reports 10/10 pain.  She complains of stooling on herself after arrival in the ED.  Denies fever, chills, headache, neck pain, chest pain, SOB, N/V, weakness, dizziness, syncope.    Discussed with Arville Go (granddaughter) who reports onset of abd pain this evening.  No BM in 2 days.  Taking hydrocodone for pain after fall and subsequent hip fracture.  Pt did take Miralax daily without relief.  Granddaughter was able to disimpact some feces around 7pm.  Pt then took a warm bath and afterwards she bean leaking stool.  No Pepto Bismol, no hx of GI bleed, but does take iron 2x per day.  Granddaughter reports stools are normally black.  Pt with persistent hernia which sometimes swells with constipation but it is not significantly swollen today; blue hue of the hernia is normal.   Records show Left intertrochanteric femur fracture with ORIF by Dr. Doreatha Martin on 6/25/2020and blood loss anemia after surgery (Hgb 8.2 at discharge on 11/24/2018).        The history is provided by the patient, medical records and a relative. No language interpreter was used.    Past Medical History:  Diagnosis Date  . Cancer [facility])    breast cancer  . Cardiomyopathy ([place])    a. 04/2015 Echo: EF 55-60%;  b. 07/2015 Echo: EF 35-40%, mod LVH, antsep DK, mod AI, sev MR, sev dil LA, mildly dil RA, mild-mod TR, PASP  3mmHg-->conservatively managed.  . Chronic systolic CHF (congestive heart failure) ([place])    a. 07/2015 Echo: EF 35-40%.  . CKD (chronic kidney disease), stage IV ([place])   . Diabetes mellitus without complication ([place])   . Essential hypertension   . HLD (hyperlipidemia)   . Permanent atrial fibrillation    a. diagnosed 04/2015 --> conservative mgmt with rate control and xarelto.  . Pulsatile neck mass    a. 11/2015 Neck U/S: pulsatile R neck mass correlates w/ a toruous and mildy ectatic segment of the R SCA measuring ~ 2.1 cm in max diameter - no change since noted on 12/16 CT.  [name] Severe mitral regurgitation by prior echocardiogram    a. 07/2015 sev MR    Patient Active Problem List   Diagnosis Date Noted  . Postoperative anemia due to acute blood loss 11/23/2018  . Closed left hip fracture, initial encounter ([place]) 11/20/2018  . Acute and chronic respiratory failure (acute-on-chronic) ([place]) 12/23/2017  . CKD (chronic kidney disease), stage V ([place]) 12/23/2017  . Essential hypertension   . HLD (hyperlipidemia)   . PAF (paroxysmal atrial fibrillation) ([place])   . Cardiomyopathy ([place])   . Mitral regurgitation 08/24/2015  . Congestive dilated cardiomyopathy ([place]) 08/24/2015  . Congestive heart failure (CHF) ([place]) 08/24/2015  . Acute on chronic congestive heart failure ([place])   . Dyspnea 08/02/2015  . AKI (acute kidney injury) ([place]) 05/10/2015  .  Rib fractures - right #8-10 05/09/2015  .  Atrial fibrillation ([place]) 05/09/2015  . Scapula fracture 05/09/2015  . Fall 05/09/2015  . Essential hypertension, benign 05/09/2015  . Diabetes mellitus without complication ([place]) 15/83/0940  . History of breast cancer 05/09/2015    Past Surgical History:  Procedure Laterality Date  . ABDOMINAL HYSTERECTOMY    . BREAST LUMPECTOMY    . FRACTURE SURGERY     L femur  . JOINT REPLACEMENT     knees  . ORIF HIP FRACTURE Left 11/21/2018   Procedure: OPEN REDUCTION INTERNAL FIXATION  OF THE LEFT HIP;   Surgeon: Shona Needles, MD;  Location: [facility];  Service: Orthopedics;  Laterality: Left;     OB History   No obstetric history on file.      Home Medications    Prior to Admission medications   Medication Sig Start Date End Date Taking? Authorizing Provider  acetaminophen (TYLENOL) 500 MG tablet Take 1,000 mg by mouth every 6 (six) hours as needed for headache (pain).     [provider]  apixaban (ELIQUIS) 2.5 MG TABS tablet Take 1 tablet (2.5 mg total) by mouth 2 (two) times daily. 08/27/18   Lelon Perla, MD  carvedilol (COREG) 25 MG tablet Take 1 tablet (25 mg total) by mouth at bedtime. 08/27/18   Lelon Perla, MD  diltiazem (CARDIZEM CD) 120 MG 24 hr capsule Take 1 capsule (120 mg total) by mouth daily. 07/12/18   Lelon Perla, MD  ferrous sulfate 325 (65 FE) MG tablet Take 325 mg by mouth 2 (two) times daily with a meal.     [provider]  hydrALAZINE (APRESOLINE) 10 MG tablet Take 1 tablet (10 mg total) by mouth 3 (three) times daily. 08/27/18   Lelon Perla, MD  isosorbide mononitrate (IMDUR) 30 MG 24 hr tablet Take 1 tablet (30 mg total) by mouth daily. 09/30/18   Lelon Perla, MD  LORazepam (ATIVAN) 0.5 MG tablet Take 0.25 mg by mouth at bedtime.  01/01/18   [provider]  lubiprostone (AMITIZA) 8 MCG capsule Take 1 capsule (8 mcg total) by mouth 2 (two) times daily with a meal. 12/02/18   Mariame Rybolt, Jarrett Soho, PA-C  LUTEIN PO Take 25 mg by mouth daily.     [provider]  LYRICA 25 MG capsule Take 25 mg by mouth every evening.  12/28/17   [provider]  oxyCODONE (OXY IR/ROXICODONE) 5 MG immediate release tablet Take 1 tablet (5 mg total) by mouth every 4 (four) hours as needed for moderate pain. 11/22/18   Delray Alt, PA-C  OXYGEN Inhale 3 L into the lungs See admin instructions. Use every night at bedtime and during the day if needed for shortness of breath    [provider]  polyethylene glycol  (GOLYTELY) 236 g solution 240 mL (8 oz) every 10-30 minutes until bowls move 12/02/18   Marialuisa Basara, Jarrett Soho, PA-C  polyethylene glycol (MIRALAX / GLYCOLAX) packet Take 17 g by mouth daily.  05/13/15   [provider]  Resveratrol 250 MG CAPS Take 250 mg by mouth daily.    [provider]  saxagliptin HCl (ONGLYZA) 2.5 MG TABS tablet Take 2.5 mg by mouth daily.  06/05/16   [provider]  sodium chloride (MURO 128) 5 % ophthalmic solution Place 1 drop into the left eye 4 (four) times daily.    [provider]  torsemide (DEMADEX) 20 MG tablet Take 1 tablet (20 mg total) by mouth daily. 03/13/18 11/20/18  Lelon Perla, MD    Family History Family History  Problem Relation Age of Onset  . Coronary artery disease Father   . Stroke Mother     Social History Social History   Tobacco Use  . Smoking status: Never Smoker  . Smokeless tobacco: Never Used  Substance Use Topics  . Alcohol use: No    Alcohol/week: 0.0 standard drinks  . Drug use: Yes     Allergies   Codeine, Neurontin [gabapentin], and Oxycodone   Review of Systems Review of Systems  Constitutional: Negative for appetite change, diaphoresis, fatigue, fever and unexpected weight change.  HENT: Negative for mouth sores.   Eyes: Negative for visual disturbance.  Respiratory: Negative for cough, chest tightness, shortness of breath and wheezing.   Cardiovascular: Negative for chest pain.  Gastrointestinal: Positive for abdominal pain and constipation. Negative for diarrhea, nausea and vomiting.  Endocrine: Negative for polydipsia, polyphagia and polyuria.  Genitourinary: Negative for dysuria, frequency, hematuria and urgency.  Musculoskeletal: Negative for back pain and neck stiffness.  Skin: Negative for rash.  Allergic/Immunologic: Negative for immunocompromised state.  Neurological: Negative for syncope, light-headedness and headaches.  Hematological: Does not bruise/bleed easily.   Psychiatric/Behavioral: Negative for sleep disturbance. [name] not nervous/anxious.      Physical Exam Updated Vital Signs BP (!) 174/67 (BP Location: Right Arm)   Pulse 84   Temp 98.4 F (36.9 C) (Oral)   Resp 16   Ht 5' (1.524 m)   Wt 59 kg   SpO2 98%   BMI 25.39 kg/m   Physical Exam Vitals signs and nursing note reviewed.  Constitutional:      Appearance: She is not diaphoretic.     Comments: Very uncomfortable  HENT:     Head: Normocephalic.  Eyes:     General: No scleral icterus.    Conjunctiva/sclera: Conjunctivae normal.  Neck:     Musculoskeletal: Normal range of motion.  Cardiovascular:     Rate and Rhythm: Regular rhythm. Tachycardia present.     Pulses: Normal pulses.          Radial pulses are 2+ on the right side and 2+ on the left side.     Comments: Mild tachycardia Pulmonary:     Effort: No tachypnea, accessory muscle usage, prolonged expiration, respiratory distress or retractions.     Breath sounds: No stridor.     Comments: Equal chest rise. No increased work of breathing. Abdominal:     General: There is no distension.     Palpations: Abdomen is soft. There is mass.     Tenderness: There is no abdominal tenderness. There is no guarding or rebound.    Genitourinary:    Comments: Black stool noted on patient and in the bed. Musculoskeletal:     Comments: Moves all extremities equally and without difficulty.  Skin:    General: Skin is warm and dry.     Capillary Refill: Capillary refill takes less than 2 seconds.  Neurological:     Mental Status: She is alert.     GCS: GCS eye subscore is 4. GCS verbal subscore is 5. GCS motor subscore is 6.     Comments: Speech is clear and goal oriented.  Psychiatric:        Mood and Affect: Mood normal.      ED Treatments / Results  Labs (all labs ordered are listed, but only abnormal results are displayed) Labs Reviewed  CBC WITH DIFFERENTIAL/PLATELET - Abnormal; Notable for the  following components:      Result Value   WBC 14.6 (*)    RBC 3.04 (*)    Hemoglobin 9.3 (*)    HCT 28.3 (*)    Platelets 433 (*)    Neutro Abs 12.9 (*)    Lymphs Abs 0.4 (*)    Monocytes Absolute 1.1 (*)    Abs Immature Granulocytes 0.13 (*)    All other components within normal limits  COMPREHENSIVE METABOLIC PANEL - Abnormal; Notable for the following components:   CO2 21 (*)    Glucose, Bld 170 (*)    BUN 96 (*)    Creatinine, Ser 2.66 (*)    Albumin 3.3 (*)    GFR calc non Af Amer 15 (*)    GFR calc Af Amer 18 (*)    All other components within normal limits  PROTIME-INR - Abnormal; Notable for the following components:   Prothrombin Time 19.3 (*)    INR 1.7 (*)    All other components within normal limits  LIPASE, BLOOD  URINALYSIS, ROUTINE W REFLEX MICROSCOPIC  OCCULT BLOOD X 1 CARD TO LAB, STOOL  POC OCCULT BLOOD, ED  TYPE AND SCREEN  ABO/RH     Radiology Ct Abdomen Pelvis Wo Contrast  Result Date: 12/02/2018 CLINICAL DATA:  85 y/o F; abdominal pain and constipation for 2-3 days. No nausea and vomiting. History of ventral hernia. EXAM: CT ABDOMEN AND PELVIS WITHOUT CONTRAST TECHNIQUE: Multidetector CT imaging of the abdomen and pelvis was performed following the standard protocol without IV contrast. COMPARISON:  11/28/2005 CT abdomen and pelvis. 12/23/2017 chest radiograph FINDINGS: Lower chest: Cardiomegaly. Trace left pleural effusion. Hepatobiliary: No focal liver abnormality is seen. Cholelithiasis. No pericholecystic fluid, gallbladder wall thickening, or biliary dilatation. Pancreas: Unremarkable. No pancreatic ductal dilatation or surrounding inflammatory changes. Spleen: Normal in size without focal abnormality. Adrenals/Urinary Tract: Adrenal glands are unremarkable. Kidneys are normal, without renal calculi, focal lesion, or hydronephrosis. Bladder is unremarkable. Stomach/Bowel: Gastroplasty. Appendix appears normal. No evidence of bowel wall thickening,  distention, or inflammatory changes. Vascular/Lymphatic: Aortic atherosclerosis. No enlarged abdominal or pelvic lymph nodes. Reproductive: Status post hysterectomy. No adnexal masses. Other: Right paraumbilical hernia with 3.2 cm neck and hernia sac measuring up to 12.6 cm. The hernia contains small and large bowel. No associated obstructive or inflammatory changes. Musculoskeletal: No fracture is seen. Stable severe L2 compression deformity. Partially visualized left hip prosthesis. IMPRESSION: 1. Large right paraumbilical hernia containing small and large bowel without obstructive or inflammatory changes. Hernia neck measures 3.2 cm and hernia sac measures up to 12.6 cm. 2. Trace left pleural effusion. 3. Cardiomegaly. 4. Cholelithiasis. 5. Stable severe L2 compression deformity. 6. Aortic Atherosclerosis (ICD10-I70.0). Electronically Signed   By: Kristine Garbe M.D.   On: 12/02/2018 00:36    Procedures Procedures (including critical care time)  Medications Ordered in ED Medications  fentaNYL (SUBLIMAZE) injection 50 mcg (50 mcg Intravenous Given 12/01/18 2314)  sodium chloride 0.9 % bolus 250 mL (0 mLs Intravenous Stopped 12/02/18 0042)  milk and molasses enema (240 mLs Rectal Given 12/02/18 0207)  fentaNYL (SUBLIMAZE) injection 50 mcg (50 mcg Intravenous Given 12/02/18 0353)     Initial Impression / Assessment and Plan / ED Course  I have reviewed the triage vital signs and the nursing notes.  Pertinent labs & imaging results that were available during my care of the patient were reviewed by me and considered in my medical decision making (see chart for details).  Clinical Course as of Dec 02 423  Mon Dec 02, 2018  0003 baseline  Creatinine(!): 2.66 [HM]  0003 elevated  WBC(!): 14.6 [HM]  0003 improved  Hemoglobin(!): 9.3 [HM]  0004 HTN on arrival.  Suspect pain is contributing.  Pain control given.  BP(!): 174/67 [HM]  0054 Large right paraumbilical hernia containing small and  large bowel without obstructive or inflammatory changes.  No signs of appendicitis, colitis.  Large volume of stool present with stool ball in the rectum. Personally evaluated these images.  CT ABDOMEN PELVIS WO CONTRAST [HM]  0055 She continues to have some pain.  No clinical or radiographic evidence of hernia incarceration, diverticulitis.  Suspect severe constipation.  Will give enema.   [HM]  0104 BP improved  BP(!): 122/57 [HM]  [address] [place] ([place]) [phone]   [HM]  [phone] Enema with small bowel movement.  Pt continues to have leakage as expected.  Will d/c home with laxatives.   [HM]  0421 Pt resting comfortably.  Abd soft and nontender.  Pt comfortable with d/c home.  Discussed with granddaughter and answered all questions.   [HM]  6553 BP continues to improve with pain control  BP(!): 122/57 [HM]    Clinical Course User Index [HM] Jilliam Bellmore, Gwenlyn Perking        Presents with severe abdominal pain and constipation.  She does have a ventral hernia however this is large and less likely to be incarcerated.  Black stool was noted on the patient however she is taking iron supplements.  Additionally she is anticoagulated on Eliquis.  Granddaughter reports no missed doses.  Will control pain, check labs and obtain CT scan.  4:24 AM Pt resting comfortably.  Leukocytosis is noted.  CT scan without signs of infection, perforation or bowel obstruction.  Significant constipation is noted.  Patient given enema here in the emergency department with small bowel movement.  She continues to leak some stool.  Her pain is well controlled and abdomen is soft and nontender on my repeat exams.  No evidence of urinary tract infection.  Anemia is improving.  Creatinine is at baseline.  Will be discharged home with GoLYTELY and Amitiza.  Discussed exam plan with granddaughter who lives with the patient.  Discussed medication regimen, close follow-up with primary care and reasons to  return immediately to the emergency department.  She states understanding and is in agreement with the plan.  The patient was discussed with and seen by Dr. Roxanne Mins who agrees with the treatment plan.   Final Clinical Impressions(s) / ED Diagnoses   Final diagnoses:  Generalized abdominal pain  Drug-induced constipation    ED Discharge Orders         Ordered    polyethylene glycol (GOLYTELY) 236 g solution     12/02/18 0417    lubiprostone (AMITIZA) 8 MCG capsule  2 times daily with meals     12/02/18 0417           Tecia Cinnamon, Jarrett Soho, PA-C 74/82/70 [id number]    Delora Fuel, MD 54/49/20 [phone]

## 2018-12-01 NOTE — ED Notes (Signed)
Bed: [facility] Expected date:  Expected time:  Means of arrival:  Comments: 

## 2018-12-01 NOTE — ED Triage Notes (Signed)
Patient from home arrived by [facility] with Abd  Pain and constipation for 2-3 days, no N/V

## 2018-12-02 ENCOUNTER — Emergency Department (HOSPITAL_COMMUNITY): Payer: Medicare Other

## 2018-12-02 DIAGNOSIS — R1084 Generalized abdominal pain: Secondary | ICD-10-CM | POA: Diagnosis not present

## 2018-12-02 LAB — URINALYSIS, ROUTINE W REFLEX MICROSCOPIC
Bilirubin Urine: NEGATIVE
Glucose, UA: NEGATIVE mg/dL
Hgb urine dipstick: NEGATIVE
Ketones, ur: NEGATIVE mg/dL
Leukocytes,Ua: NEGATIVE
Nitrite: NEGATIVE
Protein, ur: NEGATIVE mg/dL
Specific Gravity, Urine: 1.012 (ref 1.005–1.030)
pH: 5 (ref 5.0–8.0)

## 2018-12-02 LAB — TYPE AND SCREEN
ABO/RH(D): A NEG
Antibody Screen: NEGATIVE

## 2018-12-02 LAB — ABO/RH: ABO/RH(D): A NEG

## 2018-12-02 MED ORDER — MILK AND MOLASSES ENEMA
1.0000 | Freq: Once | RECTAL | Status: AC
  Administered 2018-12-02: 240 mL via RECTAL
  Filled 2018-12-02: qty 240

## 2018-12-02 MED ORDER — FENTANYL CITRATE (PF) 100 MCG/2ML IJ SOLN
50.0000 ug | Freq: Once | INTRAMUSCULAR | Status: AC
  Administered 2018-12-02: 04:00:00 50 ug via INTRAVENOUS
  Filled 2018-12-02: qty 2

## 2018-12-02 MED ORDER — GOLYTELY 236 G PO SOLR: ORAL | 0 refills | Status: DC

## 2018-12-02 MED ORDER — LUBIPROSTONE 8 MCG PO CAPS: 8.0000 ug | ORAL_CAPSULE | Freq: Two times a day (BID) | ORAL | 0 refills | Status: DC

## 2018-12-02 NOTE — Discharge Instructions (Addendum)
1. Medications: Golytely - take as directed until bowels move.  After pt is able to move bowels, stop Golytely and begin Amitiza until pt is no longer taking narcotic pain medication.  Usual home medications 2. Treatment: rest, drink plenty of fluids, advance diet slowly 3. Follow Up: Please followup with your primary doctor in 2 days for discussion of your diagnoses and further evaluation after today's visit; if you do not have a primary care doctor use the resource guide provided to find one; Please return to the ER for persistent vomiting, high fevers or worsening symptoms

## 2018-12-17 ENCOUNTER — Ambulatory Visit: Payer: Medicare Other | Admitting: Podiatry

## 2019-01-02 ENCOUNTER — Other Ambulatory Visit: Payer: Self-pay | Admitting: Cardiology

## 2019-01-02 DIAGNOSIS — I5042 Chronic combined systolic (congestive) and diastolic (congestive) heart failure: Secondary | ICD-10-CM

## 2019-01-08 ENCOUNTER — Encounter: Payer: Self-pay | Admitting: *Deleted

## 2019-01-10 ENCOUNTER — Other Ambulatory Visit: Payer: Self-pay | Admitting: Cardiology

## 2019-01-10 MED ORDER — DILTIAZEM HCL ER COATED BEADS 120 MG PO CP24: 120.0000 mg | ORAL_CAPSULE | Freq: Every day | ORAL | 5 refills | Status: DC

## 2019-01-10 MED ORDER — ISOSORBIDE MONONITRATE ER 30 MG PO TB24: 30.0000 mg | ORAL_TABLET | Freq: Every day | ORAL | 5 refills | Status: DC

## 2019-01-10 NOTE — Telephone Encounter (Signed)
°*  STAT* If patient is at the pharmacy, call can be transferred to refill team.   1. Which medications need to be refilled? (please list name of each medication and dose if known) Isosorbide 30 mg diltiazem 120 mg  2. Which pharmacy/location (including street and city if local pharmacy) is medication to be sent to? [facility] [address]  3. Do they need a 30 day or 90 day supply? 30 patient missed place what she had left.

## 2019-01-10 NOTE — Telephone Encounter (Signed)
Let patient know that I refilled her Diltiazem and Imdur.

## 2019-01-21 ENCOUNTER — Encounter: Payer: Self-pay | Admitting: Podiatry

## 2019-01-21 ENCOUNTER — Other Ambulatory Visit: Payer: Self-pay

## 2019-01-21 ENCOUNTER — Ambulatory Visit (INDEPENDENT_AMBULATORY_CARE_PROVIDER_SITE_OTHER): Payer: Medicare Other | Admitting: Podiatry

## 2019-01-21 VITALS — Temp 98.4°F

## 2019-01-21 DIAGNOSIS — M79674 Pain in right toe(s): Secondary | ICD-10-CM | POA: Diagnosis not present

## 2019-01-21 DIAGNOSIS — L84 Corns and callosities: Secondary | ICD-10-CM

## 2019-01-21 DIAGNOSIS — M79675 Pain in left toe(s): Secondary | ICD-10-CM

## 2019-01-21 DIAGNOSIS — E119 Type 2 diabetes mellitus without complications: Secondary | ICD-10-CM | POA: Diagnosis not present

## 2019-01-21 DIAGNOSIS — B351 Tinea unguium: Secondary | ICD-10-CM | POA: Diagnosis not present

## 2019-01-21 NOTE — Patient Instructions (Signed)
Diabetes Mellitus and Foot Care Foot care is an important part of your health, especially when you have diabetes. Diabetes may cause you to have problems because of poor blood flow (circulation) to your feet and legs, which can cause your skin to:  Become thinner and drier.  Break more easily.  Heal more slowly.  Peel and crack. You may also have nerve damage (neuropathy) in your legs and feet, causing decreased feeling in them. This means that you may not notice minor injuries to your feet that could lead to more serious problems. Noticing and addressing any potential problems early is the best way to prevent future foot problems. How to care for your feet Foot hygiene  Wash your feet daily with warm water and mild soap. Do not use hot water. Then, pat your feet and the areas between your toes until they are completely dry. Do not soak your feet as this can dry your skin.  Trim your toenails straight across. Do not dig under them or around the cuticle. File the edges of your nails with an emery board or nail file.  Apply a moisturizing lotion or petroleum jelly to the skin on your feet and to dry, brittle toenails. Use lotion that does not contain alcohol and is unscented. Do not apply lotion between your toes. Shoes and socks  Wear clean socks or stockings every day. Make sure they are not too tight. Do not wear knee-high stockings since they may decrease blood flow to your legs.  Wear shoes that fit properly and have enough cushioning. Always look in your shoes before you put them on to be sure there are no objects inside.  To break in new shoes, wear them for just a few hours a day. This prevents injuries on your feet. Wounds, scrapes, corns, and calluses  Check your feet daily for blisters, cuts, bruises, sores, and redness. If you cannot see the bottom of your feet, use a mirror or ask someone for help.  Do not cut corns or calluses or try to remove them with medicine.  If you  find a minor scrape, cut, or break in the skin on your feet, keep it and the skin around it clean and dry. You may clean these areas with mild soap and water. Do not clean the area with peroxide, alcohol, or iodine.  If you have a wound, scrape, corn, or callus on your foot, look at it several times a day to make sure it is healing and not infected. Check for: ? Redness, swelling, or pain. ? Fluid or blood. ? Warmth. ? Pus or a bad smell. General instructions  Do not cross your legs. This may decrease blood flow to your feet.  Do not use heating pads or hot water bottles on your feet. They may burn your skin. If you have lost feeling in your feet or legs, you may not know this is happening until it is too late.  Protect your feet from hot and cold by wearing shoes, such as at the beach or on hot pavement.  Schedule a complete foot exam at least once a year (annually) or more often if you have foot problems. If you have foot problems, report any cuts, sores, or bruises to your health care provider immediately. Contact a health care provider if:  You have a medical condition that increases your risk of infection and you have any cuts, sores, or bruises on your feet.  You have an injury that is not   healing.  You have redness on your legs or feet.  You feel burning or tingling in your legs or feet.  You have pain or cramps in your legs and feet.  Your legs or feet are numb.  Your feet always feel cold.  You have pain around a toenail. Get help right away if:  You have a wound, scrape, corn, or callus on your foot and: ? You have pain, swelling, or redness that gets worse. ? You have fluid or blood coming from the wound, scrape, corn, or callus. ? Your wound, scrape, corn, or callus feels warm to the touch. ? You have pus or a bad smell coming from the wound, scrape, corn, or callus. ? You have a fever. ? You have a red line going up your leg. Summary  Check your feet every day  for cuts, sores, red spots, swelling, and blisters.  Moisturize feet and legs daily.  Wear shoes that fit properly and have enough cushioning.  If you have foot problems, report any cuts, sores, or bruises to your health care provider immediately.  Schedule a complete foot exam at least once a year (annually) or more often if you have foot problems. This information is not intended to replace advice given to you by your health care provider. Make sure you discuss any questions you have with your health care provider. Document Released: 05/12/2000 Document Revised: 06/27/2017 Document Reviewed: 06/16/2016 Elsevier Patient Education  2020 [employer]   Onychomycosis/Fungal Toenails  WHAT IS IT? An infection that lies within the keratin of your nail plate that is caused by a fungus.  WHY ME? Fungal infections affect all ages, sexes, races, and creeds.  There may be many factors that predispose you to a fungal infection such as age, coexisting medical conditions such as diabetes, or an autoimmune disease; stress, medications, fatigue, genetics, etc.  Bottom line: fungus thrives in a warm, moist environment and your shoes offer such a location.  IS IT CONTAGIOUS? Theoretically, yes.  You do not want to share shoes, nail clippers or files with someone who has fungal toenails.  Walking around barefoot in the same room or sleeping in the same bed is unlikely to transfer the organism.  It is important to realize, however, that fungus can spread easily from one nail to the next on the same foot.  HOW DO WE TREAT THIS?  There are several ways to treat this condition.  Treatment may depend on many factors such as age, medications, pregnancy, liver and kidney conditions, etc.  It is best to ask your doctor which options are available to you.  1. No treatment.   Unlike many other medical concerns, you can live with this condition.  However for many people this can be a painful condition and may lead to  ingrown toenails or a bacterial infection.  It is recommended that you keep the nails cut short to help reduce the amount of fungal nail. 2. Topical treatment.  These range from herbal remedies to prescription strength nail lacquers.  About 40-50% effective, topicals require twice daily application for approximately 9 to 12 months or until an entirely new nail has grown out.  The most effective topicals are medical grade medications available through physicians offices. 3. Oral antifungal medications.  With an 80-90% cure rate, the most common oral medication requires 3 to 4 months of therapy and stays in your system for a year as the new nail grows out.  Oral antifungal medications do require   blood work to make sure it is a safe drug for you.  A liver function panel will be performed prior to starting the medication and after the first month of treatment.  It is important to have the blood work performed to avoid any harmful side effects.  In general, this medication safe but blood work is required. 4. Laser Therapy.  This treatment is performed by applying a specialized laser to the affected nail plate.  This therapy is noninvasive, fast, and non-painful.  It is not covered by insurance and is therefore, out of pocket.  The results have been very good with a 80-95% cure rate.  [facility] is the only practice in the area to offer this therapy. 5. Permanent Nail Avulsion.  Removing the entire nail so that a new nail will not grow back. 

## 2019-01-30 ENCOUNTER — Ambulatory Visit: Payer: Medicare Other | Admitting: Cardiology

## 2019-01-30 ENCOUNTER — Telehealth: Payer: Self-pay | Admitting: Cardiology

## 2019-01-30 ENCOUNTER — Other Ambulatory Visit: Payer: Self-pay

## 2019-01-30 NOTE — Progress Notes (Signed)
Subjective: [name] presents today for preventative diabetic foot care with painful, thick toenails 1-5 b/l that she cannot cut and which interfere with daily activities.  Pain is aggravated when wearing enclosed shoe gear.  She picked up her diabetic shoes on 11/19/2018.    She is taking Lyrica and on blood thinner, Eliquis.  She voices no new pedal problems on today's visit.   Current Outpatient Medications:  .  acetaminophen (TYLENOL) 500 MG tablet, Take 1,000 mg by mouth every 6 (six) hours as needed for headache (pain). , Disp: , Rfl:  .  apixaban (ELIQUIS) 2.5 MG TABS tablet, Take 1 tablet (2.5 mg total) by mouth 2 (two) times daily., Disp: 180 tablet, Rfl: 3 .  carvedilol (COREG) 25 MG tablet, Take 1 tablet (25 mg total) by mouth at bedtime., Disp: 90 tablet, Rfl: 2 .  cephALEXin (KEFLEX) 250 MG capsule, TAKE ONE CAPSULE (250 MG DOSE) BY MOUTH 3 (THREE) TIMES A DAY FOR 10 DAYS., Disp: , Rfl:  .  diltiazem (CARDIZEM CD) 120 MG 24 hr capsule, Take 1 capsule (120 mg total) by mouth daily., Disp: 30 capsule, Rfl: 5 .  ferrous sulfate 325 (65 FE) MG tablet, Take 325 mg by mouth 2 (two) times daily with a meal. , Disp: , Rfl:  .  furosemide (LASIX) 40 MG tablet, TAKE ONE TABLET BY MOUTH EVERY DAY IN THE MORNING - MAY TAKE AN EXTRA TABLET AT LUNCH AS NEEDED FOR WEIGHT GAIN OF 3 POUNDS OR MORE OR SWELL, Disp: 30 tablet, Rfl: 1 .  hydrALAZINE (APRESOLINE) 10 MG tablet, Take 1 tablet (10 mg total) by mouth 3 (three) times daily., Disp: 270 tablet, Rfl: 2 .  HYDROcodone-acetaminophen (NORCO/VICODIN) 5-325 MG tablet, TAKE 1 TABLET BY MOUTH EVERY 6 TO 8 HOURS AS NEEDED FOR PAIN, Disp: , Rfl:  .  isosorbide mononitrate (IMDUR) 30 MG 24 hr tablet, Take 1 tablet (30 mg total) by mouth daily., Disp: 30 tablet, Rfl: 5 .  LORazepam (ATIVAN) 0.5 MG tablet, Take 0.25 mg by mouth at bedtime. , Disp: , Rfl: 0 .  lubiprostone (AMITIZA) 8 MCG capsule, Take 1 capsule (8 mcg total) by mouth 2 (two) times  daily with a meal., Disp: 60 capsule, Rfl: 0 .  LUTEIN PO, Take 25 mg by mouth daily. , Disp: , Rfl:  .  LYRICA 25 MG capsule, Take 25 mg by mouth every evening. , Disp: , Rfl: 1 .  oxyCODONE (OXY IR/ROXICODONE) 5 MG immediate release tablet, Take 1 tablet (5 mg total) by mouth every 4 (four) hours as needed for moderate pain., Disp: 30 tablet, Rfl: 0 .  OXYGEN, Inhale 3 L into the lungs See admin instructions. Use every night at bedtime and during the day if needed for shortness of breath, Disp: , Rfl:  .  polyethylene glycol (GOLYTELY) 236 g solution, 240 mL (8 oz) every 10-30 minutes until bowls move, Disp: 2000 mL, Rfl: 0 .  polyethylene glycol (MIRALAX / GLYCOLAX) packet, Take 17 g by mouth daily. , Disp: , Rfl:  .  Resveratrol 250 MG CAPS, Take 250 mg by mouth daily., Disp: , Rfl:  .  saxagliptin HCl (ONGLYZA) 2.5 MG TABS tablet, Take 2.5 mg by mouth daily. , Disp: , Rfl:  .  sodium chloride (MURO 128) 5 % ophthalmic solution, Place 1 drop into the left eye 4 (four) times daily., Disp: , Rfl:  .  torsemide (DEMADEX) 20 MG tablet, Take 1 tablet (20 mg total) by mouth daily., Disp:  180 tablet, Rfl: 3  Allergies  Allergen Reactions  . Codeine Other (See Comments)    "talks out of her head"   . Neurontin [Gabapentin] Swelling    Leg swelling  . Oxycodone Other (See Comments)    confusion     Objective: Vitals:   01/21/19 1110  Temp: 98.4 F (36.9 C)    Vascular Examination: Capillary refill time immediate x 10 digits.  Dorsalis pedis and Posterior tibial pulses palpable b/l.  Digital hair absent x 10 digits.  Skin temperature gradient WNL b/l.  Dermatological Examination: Skin with normal turgor, texture and tone b/l.  Toenails 1-5 b/l discolored, thick, dystrophic with subungual debris and pain with palpation to nailbeds due to thickness of nails.  Hyperkeratotic lesion submet 1 right foot and b/l hallux with tenderness to palpation. No edema, no erythema, no drainage,  no flocculence.  Hyperkeratosis b/l 5th digits are resolved.  Musculoskeletal: Muscle strength 5/5 to all LE muscle groups.  HAV with bunion b/l. Hammertoe deformity left 5th digit.  No pain, crepitus or joint limitation noted with ROM.   Neurological: Sensation intact with 10 gram monofilament.  Vibratory sensation intact.  Assessment: Painful onychomycosis toenails 1-5 b/l   Plan: 1. Toenails 1-5 b/l were debrided in length and girth without iatrogenic bleeding. 2. Calluses pared submetatarsal head(s) 1 right foot and b/l hallux utilizing sterile scalpel blade without incident. 3. Patient to continue soft, supportive shoe gear daily. 4. Patient to report any pedal injuries to medical professional immediately. 5. Follow up 3 months.  6. Patient/POA to call should there be a concern in the interim.

## 2019-01-30 NOTE — Telephone Encounter (Signed)
Consent for virtual visit was sent to patient's MyChart.  The cell phone number to call is in the appointment notes.

## 2019-02-05 NOTE — Progress Notes (Signed)
Virtual Visit via Video Note   This visit type was conducted due to national recommendations for restrictions regarding the COVID-19 Pandemic (e.g. social distancing) in an effort to limit this patient's exposure and mitigate transmission in our community.  Due to her co-morbid illnesses, this patient is at least at moderate risk for complications without adequate follow up.  This format is felt to be most appropriate for this patient at this time.  All issues noted in this document were discussed and addressed.  A limited physical exam was performed with this format.  Please refer to the patient's chart for her consent to telehealth for [facility].   Date:  02/06/2019   ID:  [name], DOB 16-Nov-1925, MRN [id number]  Patient Location:Home Provider Location: Home  PCP:  Bernerd Limbo, MD  Cardiologist:  Dr Stanford Breed  Evaluation Performed:  Follow-Up Visit  Chief Complaint:  FU atrial fibrillation  History of Present Illness:    Follow-up atrial fibrillation and congestive heart failure. Had fall 12/16. She had rib fractures and fracture of her right scapula. She was treated conservatively. She was noted to be in atrial fibrillation at that time. Echocardiogram revealed Ejection fraction 55-60%, mild aortic insufficiency and mild mitral regurgitation. She was treated with rate control and discharged. Reeadmitted 3/17 with CHF. Echocardiogram repeated and showed ejection fraction 35-40%, biatrial enlargement, moderate aortic insufficiency, severe mitral regurgitation and mild to moderate tricuspid regurgitation. Patienthas requested only conservative measures given her age which we thought was appropriate. She also has renal insufficiency. Patient fell June 2024 and suffered left hip fracture requiring surgical intervention.  Since last seen,she notes some dyspnea on exertion and increased lower extremity edema.  No chest pain, palpitations or syncope.  No bleeding.  The patient does  not have symptoms concerning for COVID-19 infection (fever, chills, cough, or new shortness of breath).    Past Medical History:  Diagnosis Date  .  Rib fractures - right #8-10 05/09/2015  . Acute and chronic respiratory failure (acute-on-chronic) ([place]) 12/23/2017  . Acute on chronic congestive heart failure ([place])   . AKI (acute kidney injury) ([place]) 05/10/2015  . Atrial fibrillation ([place]) 05/09/2015  . Cancer [facility])    breast cancer  . Cardiomyopathy ([place])    a. 04/2015 Echo: EF 55-60%;  b. 07/2015 Echo: EF 35-40%, mod LVH, antsep DK, mod AI, sev MR, sev dil LA, mildly dil RA, mild-mod TR, PASP 13mmHg-->conservatively managed.  . CHF exacerbation ([place]) 08/02/2015  . Chronic systolic CHF (congestive heart failure) ([place])    a. 07/2015 Echo: EF 35-40%.  . CKD (chronic kidney disease), stage IV ([place])   . CKD (chronic kidney disease), stage V ([place]) 12/23/2017  . Closed left hip fracture, initial encounter ([place]) 11/20/2018  . Congestive dilated cardiomyopathy ([place]) 08/24/2015  . Congestive heart failure (CHF) ([place]) 08/24/2015  . Diabetes mellitus without complication ([place])   . Dyspnea 08/02/2015  . Elevated lactic acid level 08/02/2015  . Essential hypertension   . Fall 05/09/2015  . History of breast cancer 05/09/2015  . HLD (hyperlipidemia)   . Mitral regurgitation 08/24/2015  . PAF (paroxysmal atrial fibrillation) ([place])    a. diagnosed 04/2015 --> conservative mgmt with rate control and xarelto.  [name] Permanent atrial fibrillation    a. diagnosed 04/2015 --> conservative mgmt with rate control and xarelto.  [name] Postoperative anemia due to acute blood loss 11/23/2018  . Pulsatile neck mass    a. 11/2015 Neck U/S: pulsatile R neck mass correlates w/ a toruous and  mildy ectatic segment of the R SCA measuring ~ 2.1 cm in max diameter - no change since noted on 12/16 CT.  [name] Scapula fracture 05/09/2015  . Severe mitral regurgitation by prior echocardiogram    a. 07/2015 sev MR   Past Surgical History:   Procedure Laterality Date  . ABDOMINAL HYSTERECTOMY    . BREAST LUMPECTOMY    . FRACTURE SURGERY     L femur  . JOINT REPLACEMENT     knees  . ORIF HIP FRACTURE Left 11/21/2018   Procedure: OPEN REDUCTION INTERNAL FIXATION  OF THE LEFT HIP;  Surgeon: Shona Needles, MD;  Location: [place];  Service: Orthopedics;  Laterality: Left;     Current Meds  Medication Sig  . acetaminophen (TYLENOL) 500 MG tablet Take 1,000 mg by mouth every 6 (six) hours as needed for headache (pain).   [name] apixaban (ELIQUIS) 2.5 MG TABS tablet Take 1 tablet (2.5 mg total) by mouth 2 (two) times daily.  . carvedilol (COREG) 25 MG tablet Take 1 tablet (25 mg total) by mouth at bedtime.  [name] diltiazem (CARDIZEM CD) 120 MG 24 hr capsule Take 1 capsule (120 mg total) by mouth daily.  . ferrous sulfate 325 (65 FE) MG tablet Take 325 mg by mouth 2 (two) times daily with a meal.   . furosemide (LASIX) 40 MG tablet TAKE ONE TABLET BY MOUTH EVERY DAY IN THE MORNING - MAY TAKE AN EXTRA TABLET AT LUNCH AS NEEDED FOR WEIGHT GAIN OF 3 POUNDS OR MORE OR SWELL  . hydrALAZINE (APRESOLINE) 10 MG tablet Take 1 tablet (10 mg total) by mouth 3 (three) times daily.  [name] HYDROcodone-acetaminophen (NORCO/VICODIN) 5-325 MG tablet TAKE 1 TABLET BY MOUTH EVERY 6 TO 8 HOURS AS NEEDED FOR PAIN  . isosorbide mononitrate (IMDUR) 30 MG 24 hr tablet Take 1 tablet (30 mg total) by mouth daily.  [name] LORazepam (ATIVAN) 0.5 MG tablet Take 0.25 mg by mouth at bedtime.   [name] lubiprostone (AMITIZA) 8 MCG capsule Take 1 capsule (8 mcg total) by mouth 2 (two) times daily with a meal.  . LUTEIN PO Take 25 mg by mouth daily.   [name] LYRICA 25 MG capsule Take 25 mg by mouth every evening.   [name] oxyCODONE (OXY IR/ROXICODONE) 5 MG immediate release tablet Take 1 tablet (5 mg total) by mouth every 4 (four) hours as needed for moderate pain.  . OXYGEN Inhale 3 L into the lungs See admin instructions. Use every night at bedtime and during the day if needed for shortness of breath   . polyethylene glycol (GOLYTELY) 236 g solution 240 mL (8 oz) every 10-30 minutes until bowls move  . polyethylene glycol (MIRALAX / GLYCOLAX) packet Take 17 g by mouth daily.   [name] Resveratrol 250 MG CAPS Take 250 mg by mouth daily.  . saxagliptin HCl (ONGLYZA) 2.5 MG TABS tablet Take 2.5 mg by mouth daily.   . sodium chloride (MURO 128) 5 % ophthalmic solution Place 1 drop into the left eye 4 (four) times daily.  [name] torsemide (DEMADEX) 20 MG tablet Take 1 tablet (20 mg total) by mouth daily.     Allergies:   Codeine, Neurontin [gabapentin], and Oxycodone   Social History   Tobacco Use  . Smoking status: Never Smoker  . Smokeless tobacco: Never Used  Substance Use Topics  . Alcohol use: No    Alcohol/week: 0.0 standard drinks  . Drug use: Yes     Family Hx: The patient's family  history includes Coronary artery disease in her father; Stroke in her mother.  ROS:   Please see the history of present illness.    No Fever, chills  or productive cough; patient complains of back pain. All other systems reviewed and are negative.   Recent Labs: 11/21/2018: Magnesium 2.4 12/01/2018: ALT 9; BUN 96; Creatinine, Ser 2.66; Hemoglobin 9.3; Platelets 433; Potassium 4.5; Sodium 135   Wt Readings from Last 3 Encounters:  12/01/18 130 lb (59 kg)  11/24/18 129 lb 10.1 oz (58.8 kg)  07/12/18 132 lb (59.9 kg)     Objective:    Vital Signs:  Pulse (!) 108   Ht 5' (1.524 m)   SpO2 97%   BMI 25.39 kg/m    VITAL SIGNS:  reviewed NAD Answers questions appropriately Normal affect Remainder of physical examination not performed (telehealth visit; coronavirus pandemic)  ASSESSMENT & PLAN:    1. Chronic combined systolic/diastolic congestive heart failure-patient complains of increased dyspnea and bilateral lower extremity edema.  I will increase Demadex to 40 mg alternating with 20 mg daily.  We are trying to balance keeping patient euvolemic with worsening renal insufficiency.  Continue fluid  restriction and low-sodium diet.  Check potassium and renal function in 1 week. 2. Cardiomyopathy-patient declined further evaluation previously.  She does not want aggressive cardiac testing and only medical therapy.  Continue hydralazine/nitrates and beta-blocker.  No ARB or Entresto given severity of renal insufficiency. 3. Permanent atrial fibrillation-continue present medications for rate control.  Continue apixaban. 4. Mitral regurgitation-as outlined above patient does not want aggressive measures or interventions.  She wants only medical therapy.  We have therefore elected not to pursue follow-up echocardiograms as it would not change our management. 5. Chronic stage IV kidney disease-followed by nephrology. 6. No CODE BLUE.  COVID-19 Education: The importance of social distancing was discussed today.  Time:   Today, I have spent 17 minutes with the patient with telehealth technology discussing the above problems.     Medication Adjustments/Labs and Tests Ordered: Current medicines are reviewed at length with the patient today.  Concerns regarding medicines are outlined above.   Tests Ordered: No orders of the defined types were placed in this encounter.   Medication Changes: No orders of the defined types were placed in this encounter.   Follow Up:  Virtual Visit in 3 month(s)  Signed, Kirk Ruths, MD  02/06/2019 8:53 AM    [place]

## 2019-02-06 ENCOUNTER — Encounter: Payer: Self-pay | Admitting: Cardiology

## 2019-02-06 ENCOUNTER — Telehealth (INDEPENDENT_AMBULATORY_CARE_PROVIDER_SITE_OTHER): Payer: Medicare Other | Admitting: Cardiology

## 2019-02-06 VITALS — HR 108 | Ht 60.0 in

## 2019-02-06 DIAGNOSIS — I340 Nonrheumatic mitral (valve) insufficiency: Secondary | ICD-10-CM

## 2019-02-06 DIAGNOSIS — I4821 Permanent atrial fibrillation: Secondary | ICD-10-CM | POA: Diagnosis not present

## 2019-02-06 DIAGNOSIS — N184 Chronic kidney disease, stage 4 (severe): Secondary | ICD-10-CM

## 2019-02-06 DIAGNOSIS — I5042 Chronic combined systolic (congestive) and diastolic (congestive) heart failure: Secondary | ICD-10-CM

## 2019-02-06 DIAGNOSIS — I420 Dilated cardiomyopathy: Secondary | ICD-10-CM | POA: Diagnosis not present

## 2019-02-06 DIAGNOSIS — I10 Essential (primary) hypertension: Secondary | ICD-10-CM

## 2019-02-06 MED ORDER — TORSEMIDE 20 MG PO TABS: ORAL_TABLET | ORAL | 3 refills | Status: DC

## 2019-02-06 NOTE — Patient Instructions (Signed)
Medication Instructions:  Increase Demadex to 40 mg, alternating with the 20 mg every other day.  If you need a refill on your cardiac medications before your next appointment, please call your pharmacy.   Lab work: BMET in 1 week. If you have labs (blood work) drawn today and your tests are completely normal, you will receive your results only by: [name] MyChart Message (if you have MyChart) OR . A paper copy in the mail If you have any lab test that is abnormal or we need to change your treatment, we will call you to review the results.  Follow-Up: At [facility], you and your health needs are our priority.  As part of our continuing mission to provide you with exceptional heart care, we have created designated Provider Care Teams.  These Care Teams include your primary Cardiologist (physician) and Advanced Practice Providers (APPs -  Physician Assistants and Nurse Practitioners) who all work together to provide you with the care you need, when you need it. You will need a follow up appointment in 3 months (virtual visit). You may see Dr.Crendhawor one of the following Advanced Practice Providers on your designated Care Team:   Kerin Ransom, PA-C [place], [place] . Sande Rives, PA-C

## 2019-02-21 ENCOUNTER — Telehealth: Payer: Self-pay | Admitting: Cardiology

## 2019-02-21 NOTE — Telephone Encounter (Signed)
Not sure we need to increase diuretics if she is not short of breath and has minimal pedal edema.  May need PA office visit to evaluate.

## 2019-02-21 NOTE — Telephone Encounter (Signed)
Follow Up:    Pt's daughter called. She said pt had increased her fluid pill as she was told, her weight still have not come down.

## 2019-02-21 NOTE — Telephone Encounter (Signed)
Spoke with pt, she reports her weight has not changed since the change in medications. She is currently taking torsemide 40 mg alternating with 20 mg every other day. She reports little swelling in feet and denies orthopnea. Her oxygen sat is running 95-97% and she denies any SOB. Her weight was 129 prior to breaking her hip. Her bmp is available in care everywhere after requesting update. Will forward for dr Stanford Breed review

## 2019-02-21 NOTE — Telephone Encounter (Signed)
Spoke with pt, Aware of dr Jacalyn Lefevre recommendations. She does not want an appointment at this time. She will watch for change in symptoms or additional weight gain and call with problems.

## 2019-02-28 ENCOUNTER — Telehealth: Payer: Self-pay | Admitting: Cardiology

## 2019-02-28 NOTE — Telephone Encounter (Signed)
New Message  Pt c/o of Chest Pain: STAT if CP now or developed within 24 hours  1. Are you having CP right now? Yes  2. Are you experiencing any other symptoms (ex. SOB, nausea, vomiting, sweating)? SOB, chest tightness, and heart rate between 95-108.  3. How long have you been experiencing CP? This morning  4. Is your CP continuous or coming and going? Coming and Going  5. Have you taken Nitroglycerin? No ?

## 2019-02-28 NOTE — Telephone Encounter (Signed)
Spoke to granddaughter - patient states she is out of breath with activity little chest tightness.  no blood pressure reading reported - heart rate between 110- 180 . O2 sat 97 % an above.    When she sits down no issues.  dx:  perm. A.fib  patient is taking Diltiazem 120 mg daily  aware will discuss with D.O.D

## 2019-02-28 NOTE — Telephone Encounter (Signed)
reviewed with D.O.D -Dr Harrell Gave.  information given to Dr Harrell Gave per order double the dose of  Diltiazem  Until Monday 03/03/19.  Spoke to patient and granddaughter- aware to increase medication until Monday.

## 2019-04-22 ENCOUNTER — Other Ambulatory Visit: Payer: Self-pay

## 2019-04-22 ENCOUNTER — Ambulatory Visit (INDEPENDENT_AMBULATORY_CARE_PROVIDER_SITE_OTHER): Payer: Medicare Other | Admitting: Podiatry

## 2019-04-22 ENCOUNTER — Encounter: Payer: Self-pay | Admitting: Podiatry

## 2019-04-22 DIAGNOSIS — M79675 Pain in left toe(s): Secondary | ICD-10-CM

## 2019-04-22 DIAGNOSIS — L84 Corns and callosities: Secondary | ICD-10-CM | POA: Diagnosis not present

## 2019-04-22 DIAGNOSIS — B351 Tinea unguium: Secondary | ICD-10-CM | POA: Diagnosis not present

## 2019-04-22 DIAGNOSIS — M79674 Pain in right toe(s): Secondary | ICD-10-CM

## 2019-04-22 DIAGNOSIS — E119 Type 2 diabetes mellitus without complications: Secondary | ICD-10-CM

## 2019-04-22 NOTE — Patient Instructions (Signed)
Diabetes Mellitus and Foot Care Foot care is an important part of your health, especially when you have diabetes. Diabetes may cause you to have problems because of poor blood flow (circulation) to your feet and legs, which can cause your skin to:  Become thinner and drier.  Break more easily.  Heal more slowly.  Peel and crack. You may also have nerve damage (neuropathy) in your legs and feet, causing decreased feeling in them. This means that you may not notice minor injuries to your feet that could lead to more serious problems. Noticing and addressing any potential problems early is the best way to prevent future foot problems. How to care for your feet Foot hygiene  Wash your feet daily with warm water and mild soap. Do not use hot water. Then, pat your feet and the areas between your toes until they are completely dry. Do not soak your feet as this can dry your skin.  Trim your toenails straight across. Do not dig under them or around the cuticle. File the edges of your nails with an emery board or nail file.  Apply a moisturizing lotion or petroleum jelly to the skin on your feet and to dry, brittle toenails. Use lotion that does not contain alcohol and is unscented. Do not apply lotion between your toes. Shoes and socks  Wear clean socks or stockings every day. Make sure they are not too tight. Do not wear knee-high stockings since they may decrease blood flow to your legs.  Wear shoes that fit properly and have enough cushioning. Always look in your shoes before you put them on to be sure there are no objects inside.  To break in new shoes, wear them for just a few hours a day. This prevents injuries on your feet. Wounds, scrapes, corns, and calluses  Check your feet daily for blisters, cuts, bruises, sores, and redness. If you cannot see the bottom of your feet, use a mirror or ask someone for help.  Do not cut corns or calluses or try to remove them with medicine.  If you  find a minor scrape, cut, or break in the skin on your feet, keep it and the skin around it clean and dry. You may clean these areas with mild soap and water. Do not clean the area with peroxide, alcohol, or iodine.  If you have a wound, scrape, corn, or callus on your foot, look at it several times a day to make sure it is healing and not infected. Check for: ? Redness, swelling, or pain. ? Fluid or blood. ? Warmth. ? Pus or a bad smell. General instructions  Do not cross your legs. This may decrease blood flow to your feet.  Do not use heating pads or hot water bottles on your feet. They may burn your skin. If you have lost feeling in your feet or legs, you may not know this is happening until it is too late.  Protect your feet from hot and cold by wearing shoes, such as at the beach or on hot pavement.  Schedule a complete foot exam at least once a year (annually) or more often if you have foot problems. If you have foot problems, report any cuts, sores, or bruises to your health care provider immediately. Contact a health care provider if:  You have a medical condition that increases your risk of infection and you have any cuts, sores, or bruises on your feet.  You have an injury that is not   healing.  You have redness on your legs or feet.  You feel burning or tingling in your legs or feet.  You have pain or cramps in your legs and feet.  Your legs or feet are numb.  Your feet always feel cold.  You have pain around a toenail. Get help right away if:  You have a wound, scrape, corn, or callus on your foot and: ? You have pain, swelling, or redness that gets worse. ? You have fluid or blood coming from the wound, scrape, corn, or callus. ? Your wound, scrape, corn, or callus feels warm to the touch. ? You have pus or a bad smell coming from the wound, scrape, corn, or callus. ? You have a fever. ? You have a red line going up your leg. Summary  Check your feet every day  for cuts, sores, red spots, swelling, and blisters.  Moisturize feet and legs daily.  Wear shoes that fit properly and have enough cushioning.  If you have foot problems, report any cuts, sores, or bruises to your health care provider immediately.  Schedule a complete foot exam at least once a year (annually) or more often if you have foot problems. This information is not intended to replace advice given to you by your health care provider. Make sure you discuss any questions you have with your health care provider. Document Released: 05/12/2000 Document Revised: 06/27/2017 Document Reviewed: 06/16/2016 Elsevier Patient Education  2020 [employer]  

## 2019-04-26 NOTE — Progress Notes (Signed)
Subjective: [name] is seen today for preventative diabetic foot care follow up painful, elongated, thickened toenails bilateral feet that she cannot cut. Pain interferes with daily activities. Aggravating factor includes wearing enclosed shoe gear and relieved with periodic debridement.  Medications reviewed in chart.  Allergies  Allergen Reactions  . Codeine Other (See Comments)    "talks out of her head"   . Neurontin [Gabapentin] Swelling    Leg swelling  . Oxycodone Other (See Comments)    confusion     Objective: [name], WD, WN in NAD. AAO x 3.   Vascular Examination: Capillary refill time immediate b/l.  Dorsalis pedis present b/l.  Posterior tibial pulses present b/l.  Digital hair absent b/l.   Skin temperature gradient WNL b/l.   Dermatological Examination: Skin with normal turgor, texture and tone b/l.  Toenails 1-5 b/l discolored, thick, dystrophic with subungual debris and pain with palpation to nailbeds due to thickness of nails.  Hyperkeratotic lesion submet head 1 b/l, b/l hallux and b/l heels with tenderness to palpation. No edema, no erythema, no drainage, no flocculence.  Musculoskeletal: Muscle strength 5/5 to all LE muscle groups b/l.  HAV with bunion deformity b/l.  Hammertoe left 5th digit.  No pain, crepitus or joint limitation noted with ROM.   Neurological Examination: Protective sensation intact 5/5 with 10 gram monofilament bilaterally.  Epicritic sensation present bilaterally.  Vibratory sensation intact bilaterally.   Assessment: Painful onychomycosis toenails 1-5 b/l  Calluses submet head 1 b/l, b/l hallux and b/l heels NIDDM Encounter for diabetic foot exam  Plan: 1. Toenails 1-5 b/l were debrided in length and girth without iatrogenic bleeding.  2. Calluses pared submetatarsal head 1 b/l, b/l hallux and b/l heels utilizing sterile scalpel blade without incident. 3. Patient to continue soft,  supportive shoe gear daily. 4. Patient to report any pedal injuries to medical professional immediately. 5. Follow up 6 months per patient request.  6. Patient/POA to call should there be a concern in the interim.

## 2019-06-10 NOTE — Progress Notes (Signed)
HPI: Follow-up atrial fibrillation and congestive heart failure. Had fall 12/16. She had rib fractures and fracture of her right scapula. She was treated conservatively. She was noted to be in atrial fibrillation at that time. Echocardiogram revealed Ejection fraction 55-60%, mild aortic insufficiency and mild mitral regurgitation. She was treated with rate control and discharged. Reeadmitted 3/17 with CHF. Echocardiogram repeated and showed ejection fraction 35-40%, biatrial enlargement, moderate aortic insufficiency, severe mitral regurgitation and mild to moderate tricuspid regurgitation. Patienthas requested only conservative measures given her age which we thought was appropriate. She also has renal insufficiency. Patient fell June 2020 and suffered left hip fracture requiring surgical intervention.  Since last seen,she denies dyspnea, palpitations, syncope or bleeding.  Occasional brief sharp pain in her chest for seconds.  Current Outpatient Medications  Medication Sig Dispense Refill  . acetaminophen (TYLENOL) 500 MG tablet Take 1,000 mg by mouth every 6 (six) hours as needed for headache (pain).     [name] apixaban (ELIQUIS) 2.5 MG TABS tablet Take 1 tablet (2.5 mg total) by mouth 2 (two) times daily. 180 tablet 3  . calcium carbonate (OS-CAL) 600 MG TABS tablet Take by mouth.    . carvedilol (COREG) 25 MG tablet Take 1 tablet (25 mg total) by mouth at bedtime. 90 tablet 2  . diltiazem (CARDIZEM CD) 120 MG 24 hr capsule Take 1 capsule (120 mg total) by mouth daily. 30 capsule 5  . ferrous sulfate 325 (65 FE) MG tablet Take 325 mg by mouth 2 (two) times daily with a meal.     . furosemide (LASIX) 40 MG tablet TAKE ONE TABLET BY MOUTH EVERY DAY IN THE MORNING - MAY TAKE AN EXTRA TABLET AT LUNCH AS NEEDED FOR WEIGHT GAIN OF 3 POUNDS OR MORE OR SWELL (Patient taking differently: 80 mg. Takes 80 mg every other day) 30 tablet 1  . hydrALAZINE (APRESOLINE) 10 MG tablet Take 1 tablet (10 mg  total) by mouth 3 (three) times daily. 270 tablet 2  . isosorbide mononitrate (IMDUR) 30 MG 24 hr tablet Take 1 tablet (30 mg total) by mouth daily. 30 tablet 5  . LORazepam (ATIVAN) 0.5 MG tablet Take 0.25 mg by mouth at bedtime.   0  . lubiprostone (AMITIZA) 8 MCG capsule Take 1 capsule (8 mcg total) by mouth 2 (two) times daily with a meal. 60 capsule 0  . LUTEIN PO Take 25 mg by mouth daily.     [name] LYRICA 25 MG capsule Take 25 mg by mouth every evening.   1  . OXYGEN Inhale 3 L into the lungs See admin instructions. Use every night at bedtime and during the day if needed for shortness of breath    . polyethylene glycol (MIRALAX / GLYCOLAX) packet Take 17 g by mouth daily.     [name] Resveratrol 250 MG CAPS Take 250 mg by mouth daily.    . saxagliptin HCl (ONGLYZA) 2.5 MG TABS tablet Take 2.5 mg by mouth daily.     . sodium chloride (MURO 128) 5 % ophthalmic solution Place 1 drop into the left eye 4 (four) times daily.    [name] torsemide (DEMADEX) 20 MG tablet Take 2 tablets (40 mg) alternating with 20 mg every other day. 180 tablet 3   No current facility-administered medications for this visit.     Past Medical History:  Diagnosis Date  .  Rib fractures - right #8-10 05/09/2015  . Acute and chronic respiratory failure (acute-on-chronic) ([place]) 12/23/2017  .  Acute on chronic congestive heart failure ([place])   . AKI (acute kidney injury) ([place]) 05/10/2015  . Atrial fibrillation ([place]) 05/09/2015  . Cancer [facility])    breast cancer  . Cardiomyopathy ([place])    a. 04/2015 Echo: EF 55-60%;  b. 07/2015 Echo: EF 35-40%, mod LVH, antsep DK, mod AI, sev MR, sev dil LA, mildly dil RA, mild-mod TR, PASP 32mmHg-->conservatively managed.  . CHF exacerbation ([place]) 08/02/2015  . Chronic systolic CHF (congestive heart failure) ([place])    a. 07/2015 Echo: EF 35-40%.  . CKD (chronic kidney disease), stage IV ([place])   . CKD (chronic kidney disease), stage V ([place]) 12/23/2017  . Closed left hip fracture, initial encounter ([place])  11/20/2018  . Congestive dilated cardiomyopathy ([place]) 08/24/2015  . Congestive heart failure (CHF) ([place]) 08/24/2015  . Diabetes mellitus without complication ([place])   . Dyspnea 08/02/2015  . Elevated lactic acid level 08/02/2015  . Essential hypertension   . Fall 05/09/2015  . History of breast cancer 05/09/2015  . HLD (hyperlipidemia)   . Mitral regurgitation 08/24/2015  . PAF (paroxysmal atrial fibrillation) ([place])    a. diagnosed 04/2015 --> conservative mgmt with rate control and xarelto.  [name] Permanent atrial fibrillation ([place])    a. diagnosed 04/2015 --> conservative mgmt with rate control and xarelto.  [name] Postoperative anemia due to acute blood loss 11/23/2018  . Pulsatile neck mass    a. 11/2015 Neck U/S: pulsatile R neck mass correlates w/ a toruous and mildy ectatic segment of the R SCA measuring ~ 2.1 cm in max diameter - no change since noted on 12/16 CT.  [name] Scapula fracture 05/09/2015  . Severe mitral regurgitation by prior echocardiogram    a. 07/2015 sev MR    Past Surgical History:  Procedure Laterality Date  . ABDOMINAL HYSTERECTOMY    . BREAST LUMPECTOMY    . FRACTURE SURGERY     L femur  . JOINT REPLACEMENT     knees  . ORIF HIP FRACTURE Left 11/21/2018   Procedure: OPEN REDUCTION INTERNAL FIXATION  OF THE LEFT HIP;  Surgeon: Shona Needles, MD;  Location: [place];  Service: Orthopedics;  Laterality: Left;    Social History   Socioeconomic History  . Marital status: Widowed    Spouse name: Not on file  . Number of children: Not on file  . Years of education: Not on file  . Highest education level: Not on file  Occupational History  . Not on file  Tobacco Use  . Smoking status: Never Smoker  . Smokeless tobacco: Never Used  Substance and Sexual Activity  . Alcohol use: No    Alcohol/week: 0.0 standard drinks  . Drug use: Yes  . Sexual activity: Never  Other Topics Concern  . Not on file  Social History Narrative  . Not on file   Social Determinants of Health     Financial Resource Strain:   . Difficulty of Paying Living Expenses: Not on file  Food Insecurity:   . Worried About Charity fundraiser in the Last Year: Not on file  . Ran Out of Food in the Last Year: Not on file  Transportation Needs:   . Lack of Transportation (Medical): Not on file  . Lack of Transportation (Non-Medical): Not on file  Physical Activity:   . Days of Exercise per Week: Not on file  . Minutes of Exercise per Session: Not on file  Stress:   . Feeling of Stress : Not on file  Social Connections:   . Frequency of Communication with Friends and Family: Not on file  . Frequency of Social Gatherings with Friends and Family: Not on file  . Attends Religious Services: Not on file  . Active Member of Clubs or Organizations: Not on file  . Attends Archivist Meetings: Not on file  . Marital Status: Not on file  Intimate Partner Violence:   . Fear of Current or Ex-Partner: Not on file  . Emotionally Abused: Not on file  . Physically Abused: Not on file  . Sexually Abused: Not on file    Family History  Problem Relation Age of Onset  . Coronary artery disease Father   . Stroke Mother     ROS: Back pain but no fevers or chills, productive cough, hemoptysis, dysphasia, odynophagia, melena, hematochezia, dysuria, hematuria, rash, seizure activity, orthopnea, PND, pedal edema, claudication. Remaining systems are negative.  Physical Exam: Well-developed frail in no acute distress.  Skin is warm and dry.  HEENT is normal.  Neck is supple.  Chest is clear to auscultation with normal expansion.  Cardiovascular exam is irregular, 2/6 systolic murmur left sternal border. Abdominal exam nontender or distended. No masses palpated. Extremities show no edema. neuro grossly intact  A/P  1 chronic combined systolic/diastolic congestive heart failure-patient appears to be doing reasonably well from a volume standpoint.  Continue Demadex at present dose.   Discontinue Lasix.  Check potassium and renal function.  Continue fluid restriction and low-sodium diet.  2 cardiomyopathy-patient previously declined further evaluation.  She does not want aggressive cardiac testing or measures.  She wants only medical therapy.  Continue hydralazine/nitrates and beta-blocker.  She is not on an ARB or Entresto given renal insufficiency.  3 permanent atrial fibrillation-continue Cardizem and carvedilol for rate control.  Continue apixaban.  Check hemoglobin and renal function.  4 mitral regurgitation-patient declined aggressive measures in the past.  She only wants medical therapy.  We have therefore not pursue follow-up echocardiograms.  5 chronic stage IV kidney disease-patient is followed by nephrology.  6 no CODE BLUE.  Kirk Ruths, MD

## 2019-06-12 ENCOUNTER — Telehealth: Payer: Self-pay | Admitting: Cardiology

## 2019-06-12 NOTE — Telephone Encounter (Signed)
New Message:    Pt's Daughter called and said she will need to be with pt on Tuesday(06-17-19) for her appt with Dr Stanford Breed. She says somebody have to be with the pt all the time.

## 2019-06-13 NOTE — Telephone Encounter (Signed)
Left message okay to attend appointment.

## 2019-06-17 ENCOUNTER — Other Ambulatory Visit: Payer: Self-pay

## 2019-06-17 ENCOUNTER — Other Ambulatory Visit: Payer: Self-pay | Admitting: Cardiology

## 2019-06-17 ENCOUNTER — Ambulatory Visit (INDEPENDENT_AMBULATORY_CARE_PROVIDER_SITE_OTHER): Payer: Medicare Other | Admitting: Cardiology

## 2019-06-17 ENCOUNTER — Encounter: Payer: Self-pay | Admitting: Cardiology

## 2019-06-17 VITALS — BP 130/72 | HR 55 | Temp 97.2°F | Wt 137.6 lb

## 2019-06-17 DIAGNOSIS — I5042 Chronic combined systolic (congestive) and diastolic (congestive) heart failure: Secondary | ICD-10-CM | POA: Diagnosis not present

## 2019-06-17 DIAGNOSIS — I340 Nonrheumatic mitral (valve) insufficiency: Secondary | ICD-10-CM | POA: Diagnosis not present

## 2019-06-17 DIAGNOSIS — I4821 Permanent atrial fibrillation: Secondary | ICD-10-CM

## 2019-06-17 DIAGNOSIS — I10 Essential (primary) hypertension: Secondary | ICD-10-CM | POA: Diagnosis not present

## 2019-06-17 MED ORDER — HYDRALAZINE HCL 10 MG PO TABS: 10.0000 mg | ORAL_TABLET | Freq: Three times a day (TID) | ORAL | 3 refills | Status: DC

## 2019-06-17 MED ORDER — ISOSORBIDE MONONITRATE ER 30 MG PO TB24: 30.0000 mg | ORAL_TABLET | Freq: Every day | ORAL | 3 refills | Status: AC

## 2019-06-17 MED ORDER — CARVEDILOL 25 MG PO TABS: 25.0000 mg | ORAL_TABLET | Freq: Every day | ORAL | 3 refills | Status: DC

## 2019-06-17 NOTE — Patient Instructions (Signed)
Medication Instructions:  STOP FUROSEMIDE  *If you need a refill on your cardiac medications before your next appointment, please call your pharmacy*  Lab Work: Your physician recommends that you HAVE LAB WORK TODAY  If you have labs (blood work) drawn today and your tests are completely normal, you will receive your results only by: [name] MyChart Message (if you have MyChart) OR . A paper copy in the mail If you have any lab test that is abnormal or we need to change your treatment, we will call you to review the results.  Follow-Up: At [facility], you and your health needs are our priority.  As part of our continuing mission to provide you with exceptional heart care, we have created designated Provider Care Teams.  These Care Teams include your primary Cardiologist (physician) and Advanced Practice Providers (APPs -  Physician Assistants and Nurse Practitioners) who all work together to provide you with the care you need, when you need it.  Your next appointment:   6 month(s)  The format for your next appointment:   Either In Person or Virtual  Provider:   You may see Kirk Ruths MD or one of the following Advanced Practice Providers on your designated Care Team:    Kerin Ransom, PA-C  [place], [place]  Coletta Memos, [place]

## 2019-06-17 NOTE — Telephone Encounter (Signed)
*  STAT* If patient is at the pharmacy, call can be transferred to refill team.   1. Which medications need to be refilled? (please list name of each medication and dose if known) Carvedilol, Hydralazine and Isosorbide  2. Which pharmacy/location (including street and city if local pharmacy) is medication to be sent to? [facility]  3. Do they need a 30 day or 90 day supply? 90 days and refills

## 2019-06-17 NOTE — Telephone Encounter (Signed)
Refill sent to the pharmacy electronically.  

## 2019-06-18 ENCOUNTER — Encounter: Payer: Self-pay | Admitting: *Deleted

## 2019-06-18 ENCOUNTER — Other Ambulatory Visit: Payer: Self-pay | Admitting: *Deleted

## 2019-06-18 DIAGNOSIS — I5042 Chronic combined systolic (congestive) and diastolic (congestive) heart failure: Secondary | ICD-10-CM

## 2019-06-18 LAB — BASIC METABOLIC PANEL
BUN/Creatinine Ratio: 31 — ABNORMAL HIGH (ref 12–28)
BUN: 79 mg/dL (ref 10–36)
CO2: 17 mmol/L — ABNORMAL LOW (ref 20–29)
Calcium: 9.7 mg/dL (ref 8.7–10.3)
Chloride: 105 mmol/L (ref 96–106)
Creatinine, Ser: 2.53 mg/dL — ABNORMAL HIGH (ref 0.57–1.00)
GFR calc Af Amer: 19 mL/min/{1.73_m2} — ABNORMAL LOW (ref >59)
GFR calc non Af Amer: 16 mL/min/{1.73_m2} — ABNORMAL LOW (ref >59)
Glucose: 109 mg/dL — ABNORMAL HIGH (ref 65–99)
Potassium: 4.9 mmol/L (ref 3.5–5.2)
Sodium: 139 mmol/L (ref 134–144)

## 2019-06-18 LAB — CBC
Hematocrit: 33.5 % — ABNORMAL LOW (ref 34.0–46.6)
Hemoglobin: 11.3 g/dL (ref 11.1–15.9)
MCH: 31.8 pg (ref 26.6–33.0)
MCHC: 33.7 g/dL (ref 31.5–35.7)
MCV: 94 fL (ref 79–97)
Platelets: 247 10*3/uL (ref 150–450)
RBC: 3.55 x10E6/uL — ABNORMAL LOW (ref 3.77–5.28)
RDW: 16.3 % — ABNORMAL HIGH (ref 11.7–15.4)
WBC: 8.6 10*3/uL (ref 3.4–10.8)

## 2019-07-02 ENCOUNTER — Telehealth: Payer: Self-pay | Admitting: Cardiology

## 2019-07-02 NOTE — Telephone Encounter (Signed)
Take an additional 20 mg of Demadex today.  Follow weight. Kirk Ruths

## 2019-07-02 NOTE — Telephone Encounter (Signed)
Pt c/o swelling: STAT is pt has developed SOB within 24 hours  1)How much weight have you gained and in what time span? 3 lbs in 24 hours  2) If swelling, where is the swelling located? No  3) Are you currently taking a fluid pill? Yes   4) Are you currently SOB? No  5) Do you have a log of your daily weights (if so, list)? No   6) Have you gained 3 pounds in a day or 5 pounds in a week? Yes, 3 lbs in 24 hours  7) Have you traveled recently? No

## 2019-07-02 NOTE — Telephone Encounter (Signed)
Spoke with pt daughter, Aware of dr crenshaw's recommendations.  

## 2019-07-02 NOTE — Telephone Encounter (Signed)
Pts daughter, Sharyn Lull, called to report to Dr. Stanford Breed that the pt has gained 3 lbs in 24 hours however she is not having any peripheral edema, no SOB, and feels well.  Her weight is usually 137 and this morning it was 140 under the same circumstances. She has not had an increase in her sodium intake.   She is still taking her Demadex 40 mg alt with 20 mg every other day.   Will forward to Dr. Stanford Breed for review.

## 2019-07-11 ENCOUNTER — Telehealth: Payer: Self-pay | Admitting: Cardiology

## 2019-07-11 NOTE — Telephone Encounter (Signed)
Spoke with patient. Patient reports her weight yesterday as 140lb and this morning she is 141.2lbs. Patient reports shortness of breath with exertion but that it is mild. Patient denies dizziness, lightheadedness or chest pain. Patient reports she has been taking 40mg  of torsemide daily instead of alternating as ordered. Patient has no swelling. Patient reports she has been watching her salt intake. BP 152/70, HR 75, O2 97%.  Patient not short of breath on the phone. Patient educated to monitor for swelling, weight gain and increased shortness of breath. Patient to call with worsening symptoms. Patient scheduled for [name] on Monday to follow up.

## 2019-07-11 NOTE — Telephone Encounter (Signed)
Pt c/o swelling: STAT is pt has developed SOB within 24 hours  1) How much weight have you gained and in what time span? 5 lbs in 2 to 3 days  2) If swelling, where is the swelling located?  Not that she knows  3) Are you currently taking a fluid pill?  Yes  4) Are you currently SOB? no  5) Do you have a log of your daily weights (if so, list)? yes  6) Have you gained 3 pounds in a day or 5 pounds in a week? yes  7) Have you traveled recently?no

## 2019-07-13 NOTE — Progress Notes (Deleted)
Cardiology Office Note   Date:  07/13/2019   ID:  [name], DOB Feb 04, 1926, MRN [id number]  PCP:  Bernerd Limbo, MD  Cardiologist:  [facility]  No chief complaint on file.    History of Present Illness: [name] is a 86 y.o. female who presents for ongoing assessment and management of atrial fibrillation on apixaban 2.5 mg BID in the setting of renal insufficiency, chronic systolic CHF EF of 73%-42%, with bilateral enlargement with mild aortic insufficiency, and severe mitral regurgitation and mild moderate tricuspid regurgitation. She has a history of fall with left hip fracture with surgical intervention.  Was last seen by Dr. Stanford [name] 06/17/2019 and was stable from CV standpoint. She was euvolemic. She does not any aggressive cardiac testing or measures, therefore medical therapy is continued. No ACE, ARB, or ARNI due to renal insufficiency. Remains on hydralazine and BB. She is followed by nephrology for CKD Stage IV.   She called our office 07/02/2019 with complaints of weight gain of 3 lbs in 24 hours. Dry weight of 137 lbs, with current weight of 140 lbs. She was told to take an additional dose of Demadex and was to follow weight. She called back to report that she has continued to gain weight, total of 5 lbs. No edema. She was placed on Demadex 40 mg daily instead of QOD. She is here for follow up. She will need labs today.     Past Medical History:  Diagnosis Date  .  Rib fractures - right #8-10 05/09/2015  . Acute and chronic respiratory failure (acute-on-chronic) ([place]) 12/23/2017  . Acute on chronic congestive heart failure ([place])   . AKI (acute kidney injury) ([place]) 05/10/2015  . Atrial fibrillation ([place]) 05/09/2015  . Cancer [facility])    breast cancer  . Cardiomyopathy ([place])    a. 04/2015 Echo: EF 55-60%;  b. 07/2015 Echo: EF 35-40%, mod LVH, antsep DK, mod AI, sev MR, sev dil LA, mildly dil RA, mild-mod TR, PASP 23mmHg-->conservatively managed.  . CHF exacerbation ([place])  08/02/2015  . Chronic systolic CHF (congestive heart failure) ([place])    a. 07/2015 Echo: EF 35-40%.  . CKD (chronic kidney disease), stage IV ([place])   . CKD (chronic kidney disease), stage V ([place]) 12/23/2017  . Closed left hip fracture, initial encounter ([place]) 11/20/2018  . Congestive dilated cardiomyopathy ([place]) 08/24/2015  . Congestive heart failure (CHF) ([place]) 08/24/2015  . Diabetes mellitus without complication ([place])   . Dyspnea 08/02/2015  . Elevated lactic acid level 08/02/2015  . Essential hypertension   . Fall 05/09/2015  . History of breast cancer 05/09/2015  . HLD (hyperlipidemia)   . Mitral regurgitation 08/24/2015  . PAF (paroxysmal atrial fibrillation) ([place])    a. diagnosed 04/2015 --> conservative mgmt with rate control and xarelto.  [name] Permanent atrial fibrillation ([place])    a. diagnosed 04/2015 --> conservative mgmt with rate control and xarelto.  [name] Postoperative anemia due to acute blood loss 11/23/2018  . Pulsatile neck mass    a. 11/2015 Neck U/S: pulsatile R neck mass correlates w/ a toruous and mildy ectatic segment of the R SCA measuring ~ 2.1 cm in max diameter - no change since noted on 12/16 CT.  [name] Scapula fracture 05/09/2015  . Severe mitral regurgitation by prior echocardiogram    a. 07/2015 sev MR    Past Surgical History:  Procedure Laterality Date  . ABDOMINAL HYSTERECTOMY    . BREAST LUMPECTOMY    . FRACTURE SURGERY     L  femur  . JOINT REPLACEMENT     knees  . ORIF HIP FRACTURE Left 11/21/2018   Procedure: OPEN REDUCTION INTERNAL FIXATION  OF THE LEFT HIP;  Surgeon: Shona Needles, MD;  Location: [facility];  Service: Orthopedics;  Laterality: Left;     Current Outpatient Medications  Medication Sig Dispense Refill  . acetaminophen (TYLENOL) 500 MG tablet Take 1,000 mg by mouth every 6 (six) hours as needed for headache (pain).     [name] apixaban (ELIQUIS) 2.5 MG TABS tablet Take 1 tablet (2.5 mg total) by mouth 2 (two) times daily. 180 tablet 3  . calcium carbonate  (OS-CAL) 600 MG TABS tablet Take by mouth.    . carvedilol (COREG) 25 MG tablet Take 1 tablet (25 mg total) by mouth at bedtime. 90 tablet 3  . diltiazem (CARDIZEM CD) 120 MG 24 hr capsule Take 1 capsule (120 mg total) by mouth daily. 30 capsule 5  . ferrous sulfate 325 (65 FE) MG tablet Take 325 mg by mouth 2 (two) times daily with a meal.     . hydrALAZINE (APRESOLINE) 10 MG tablet Take 1 tablet (10 mg total) by mouth 3 (three) times daily. 270 tablet 3  . isosorbide mononitrate (IMDUR) 30 MG 24 hr tablet Take 1 tablet (30 mg total) by mouth daily. 90 tablet 3  . LORazepam (ATIVAN) 0.5 MG tablet Take 0.25 mg by mouth at bedtime.   0  . lubiprostone (AMITIZA) 8 MCG capsule Take 1 capsule (8 mcg total) by mouth 2 (two) times daily with a meal. 60 capsule 0  . LUTEIN PO Take 25 mg by mouth daily.     [name] LYRICA 25 MG capsule Take 25 mg by mouth every evening.   1  . OXYGEN Inhale 3 L into the lungs See admin instructions. Use every night at bedtime and during the day if needed for shortness of breath    . polyethylene glycol (MIRALAX / GLYCOLAX) packet Take 17 g by mouth daily.     [name] Resveratrol 250 MG CAPS Take 250 mg by mouth daily.    . saxagliptin HCl (ONGLYZA) 2.5 MG TABS tablet Take 2.5 mg by mouth daily.     . sodium chloride (MURO 128) 5 % ophthalmic solution Place 1 drop into the left eye 4 (four) times daily.    [name] torsemide (DEMADEX) 20 MG tablet Take 2 tablets (40 mg) alternating with 20 mg every other day. 180 tablet 3   No current facility-administered medications for this visit.    Allergies:   Codeine, Neurontin [gabapentin], and Oxycodone    Social History:  The patient  reports that she has never smoked. She has never used smokeless tobacco. She reports current drug use. She reports that she does not drink alcohol.   Family History:  The patient's family history includes Coronary artery disease in her father; Stroke in her mother.    ROS: All other systems are reviewed and  negative. Unless otherwise mentioned in H&P    PHYSICAL EXAM: VS:  There were no vitals taken for this visit. , BMI There is no height or weight on file to calculate BMI. GEN: Well nourished, well developed, in no acute distress HEENT: normal Neck: no JVD, carotid bruits, or masses Cardiac: ***RRR; no murmurs, rubs, or gallops,no edema  Respiratory:  Clear to auscultation bilaterally, normal work of breathing GI: soft, nontender, nondistended, + BS MS: no deformity or atrophy Skin: warm and dry, no rash Neuro:  Strength and  sensation are intact Psych: euthymic mood, full affect   EKG:  EKG {ACTION; IS/IS ELT:[id number]} ordered today. The ekg ordered today demonstrates ***   Recent Labs: 11/21/2018: Magnesium 2.4 12/01/2018: ALT 9 06/17/2019: BUN 79; Creatinine, Ser 2.53; Hemoglobin 11.3; Platelets 247; Potassium 4.9; Sodium 139    Lipid Panel No results found for: CHOL, TRIG, HDL, CHOLHDL, VLDL, LDLCALC, LDLDIRECT    Wt Readings from Last 3 Encounters:  06/17/19 137 lb 9.6 oz (62.4 kg)  12/01/18 130 lb (59 kg)  11/24/18 129 lb 10.1 oz (58.8 kg)      Other studies Reviewed: Echocardiogram 08/08/2015 Left ventricle: Wall thickness was increased in a pattern of  moderate LVH. There was focal basal hypertrophy. Systolic  function was moderately reduced. The estimated ejection fraction  was in the range of 35% to 40%. Dyskinesis of the anteroseptal  myocardium.  - Aortic valve: There was moderate regurgitation.  - Mitral valve: There was severe regurgitation.  - Left atrium: The atrium was severely dilated.  - Right atrium: The atrium was mildly dilated.  - Tricuspid valve: There was mild-moderate regurgitation.  - Pulmonary arteries: Systolic pressure was mildly increased. PA  peak pressure: 33 mm Hg (S).    ASSESSMENT AND PLAN:  1.  ***   Current medicines are reviewed at length with the patient today.    Labs/ tests ordered today include: *** Phill Myron.  [place] Pugh, ANP, AACC   07/13/2019 9:20 AM    [facility] [address] Office [phone] Fax [phone]  Notice: This dictation was prepared with Dragon dictation along with smaller phrase technology. Any transcriptional errors that result from this process are unintentional and may not be corrected upon review.

## 2019-07-14 ENCOUNTER — Ambulatory Visit: Payer: Medicare Other | Admitting: Adult Health

## 2019-07-14 NOTE — Telephone Encounter (Signed)
Spoke with pt, her weight went back down to 139 lb and she decided not to come to the office. She will call with concerns.

## 2019-08-18 ENCOUNTER — Telehealth: Payer: Self-pay | Admitting: Cardiology

## 2019-08-18 ENCOUNTER — Other Ambulatory Visit: Payer: Self-pay | Admitting: Cardiology

## 2019-08-18 NOTE — Telephone Encounter (Signed)
*  STAT* If patient is at the pharmacy, call can be transferred to refill team.   1. Which medications need to be refilled? (please list name of each medication and dose if known)   isosorbide mononitrate (IMDUR) 30 MG 24 hr tablet    2. Which pharmacy/location (including street and city if local pharmacy) is medication to be sent to? [facility] - [place], [place] - [address]  3. Do they need a 30 day or 90 day supply? 90 day supply  

## 2019-08-18 NOTE — Telephone Encounter (Signed)
Spoke with patient. Patient reports right leg swelling and pain. She states she is wearing a brace on her ankle but that it is hurting. Patient reports she was told it may have been tendonitis in the past. Patient weighs 140.8lbs this morning. She has not gained 3lbs overnight or 5lbs in a week. Patient advised to wear compression stockings and to elevate her lower extremities when sitting. Patient advised to call cardiology back if she gains 3lbs overnight or 5lbs in a week. Patient advised to contact her PCP for right ankle pain. Patient verbalized understanding.

## 2019-08-18 NOTE — Telephone Encounter (Signed)
Medication refill [facility] 06/17/2019 for #90 with 3 refills

## 2019-08-18 NOTE — Telephone Encounter (Signed)
Pt c/o swelling: STAT is pt has developed SOB within 24 hours  1) How much weight have you gained and in what time span? 3 - 4 lbs in 3 weeks  2) If swelling, where is the swelling located? Feet and ankles   3) Are you currently taking a fluid pill? Yes   4) Are you currently SOB? No   5) Do you have a log of your daily weights (if so, list)?   140 lbs  08/17/19 03/22/021  141 lbs  08/14/19 08/15/19  6) Have you gained 3 pounds in a day or 5 pounds in a week? No   7) Have you traveled recently? No   Sharyn Lull the patient's daughter is calling to report the patient's recent swelling. She states the swelling is in her feet and ankles, and she is having pain in her right ankle due to it. Sharyn Lull also noticed the patient is experiencing fatigue along with the swelling. She is requesting [place] be called back in regards to this. Please advise.

## 2019-08-21 ENCOUNTER — Encounter: Payer: Self-pay | Admitting: Orthopaedic Surgery

## 2019-08-21 ENCOUNTER — Other Ambulatory Visit: Payer: Self-pay

## 2019-08-21 ENCOUNTER — Ambulatory Visit (INDEPENDENT_AMBULATORY_CARE_PROVIDER_SITE_OTHER): Payer: Medicare Other | Admitting: Orthopaedic Surgery

## 2019-08-21 ENCOUNTER — Ambulatory Visit: Payer: Self-pay

## 2019-08-21 VITALS — Ht 60.0 in | Wt 140.0 lb

## 2019-08-21 DIAGNOSIS — M7671 Peroneal tendinitis, right leg: Secondary | ICD-10-CM

## 2019-08-21 MED ORDER — DICLOFENAC SODIUM 1 % EX GEL: 2.0000 g | Freq: Four times a day (QID) | CUTANEOUS | 1 refills | Status: DC

## 2019-08-21 MED ORDER — METHYLPREDNISOLONE 4 MG PO TBPK: ORAL_TABLET | ORAL | 0 refills | Status: DC

## 2019-08-21 NOTE — Progress Notes (Signed)
Office Visit Note   Patient: [name]           Date of Birth: 02-25-1926           MRN: [id number] Visit Date: 08/21/2019              Requested by: Bernerd Limbo, MD [place],  [place] [id number] PCP: Bernerd Limbo, MD   Assessment & Plan: Visit Diagnoses:  1. Peroneal tendinitis of lower leg, right     Plan: Impression is right ankle peroneal tendinitis.  We will place patient in an ASO brace weightbearing as tolerated.  I have called in a methylprednisolone taper as well as Voltaren gel.  She will follow-up with [place] as needed.  Follow-Up Instructions: Return if symptoms worsen or fail to improve.   Orders:  Orders Placed This Encounter  Procedures  . XR Ankle Complete Right   Meds ordered this encounter  Medications  . diclofenac Sodium (VOLTAREN) 1 % GEL    Sig: Apply 2 g topically 4 (four) times daily.    Dispense:  150 g    Refill:  1  . methylPREDNISolone (MEDROL DOSEPAK) 4 MG TBPK tablet    Sig: Take as directed    Dispense:  21 tablet    Refill:  0      Procedures: No procedures performed   Clinical Data: No additional findings.   Subjective: Chief Complaint  Patient presents with  . Right Ankle - Pain    HPI patient is a pleasant 86 year old female who comes in today with right lateral ankle pain for the past week which is progressively worsened.  No known injury or change in activity.  She previously ambulating with a walker but is having trouble bearing weight over the past few days.  She has been taking Tylenol without relief of symptoms.  No numbness, tingling or burning.  She does have a history of gout to her knee.  She also has a history of tendinitis to the right ankle.  Review of Systems as detailed in HPI.  All others reviewed and are negative.   Objective: Vital Signs: Ht 5' (1.524 m)   Wt 140 lb (63.5 kg)   BMI 27.34 kg/m   Physical Exam well-developed well-nourished female no acute distress.   Alert and oriented x3.  Ortho Exam examination of the right lower extremity reveals 1+ pitting edema.  Calf soft nontender.  She has moderate tenderness along the peroneal tendon on the right.  Increased pain with resisted inversion and eversion.  No pain with plantar flexion or dorsiflexion.  No tenderness to the medial aspect or to the anterior tibia.  She does have a flexible flatfoot.  She is neurovascularly intact distally.  Specialty Comments:  No specialty comments available.  Imaging: XR Ankle Complete Right  Result Date: 08/21/2019 X-rays demonstrate moderate degenerative changes to the foot.  No acute findings to the ankle.    PMFS History: Patient Active Problem List   Diagnosis Date Noted  . Postoperative anemia due to acute blood loss 11/23/2018  . Closed left hip fracture, initial encounter ([place]) 11/20/2018  . Acute and chronic respiratory failure (acute-on-chronic) ([place]) 12/23/2017  . CKD (chronic kidney disease), stage V ([place]) 12/23/2017  . Essential hypertension   . HLD (hyperlipidemia)   . PAF (paroxysmal atrial fibrillation) ([place])   . Cardiomyopathy ([place])   . Mitral regurgitation 08/24/2015  . Congestive dilated cardiomyopathy ([place]) 08/24/2015  . Congestive heart failure (CHF) ([place])  08/24/2015  . Acute on chronic congestive heart failure ([place])   . Dyspnea 08/02/2015  . AKI (acute kidney injury) ([place]) 05/10/2015  .  Rib fractures - right #8-10 05/09/2015  . Atrial fibrillation ([place]) 05/09/2015  . Scapula fracture 05/09/2015  . Fall 05/09/2015  . Essential hypertension, benign 05/09/2015  . Diabetes mellitus without complication ([place]) 88/89/1694  . History of breast cancer 05/09/2015   Past Medical History:  Diagnosis Date  .  Rib fractures - right #8-10 05/09/2015  . Acute and chronic respiratory failure (acute-on-chronic) ([place]) 12/23/2017  . Acute on chronic congestive heart failure ([place])   . AKI (acute kidney injury) ([place]) 05/10/2015  . Atrial  fibrillation ([place]) 05/09/2015  . Cancer [facility]    breast cancer  . Cardiomyopathy ([place])    a. 04/2015 Echo: EF 55-60%;  b. 07/2015 Echo: EF 35-40%, mod LVH, antsep DK, mod AI, sev MR, sev dil LA, mildly dil RA, mild-mod TR, PASP 51mmHg-->conservatively managed.  . CHF exacerbation ([place]) 08/02/2015  . Chronic systolic CHF (congestive heart failure) ([place])    a. 07/2015 Echo: EF 35-40%.  . CKD (chronic kidney disease), stage IV ([place])   . CKD (chronic kidney disease), stage V ([place]) 12/23/2017  . Closed left hip fracture, initial encounter ([place]) 11/20/2018  . Congestive dilated cardiomyopathy ([place]) 08/24/2015  . Congestive heart failure (CHF) ([place]) 08/24/2015  . Diabetes mellitus without complication ([place])   . Dyspnea 08/02/2015  . Elevated lactic acid level 08/02/2015  . Essential hypertension   . Fall 05/09/2015  . History of breast cancer 05/09/2015  . HLD (hyperlipidemia)   . Mitral regurgitation 08/24/2015  . PAF (paroxysmal atrial fibrillation) ([place])    a. diagnosed 04/2015 --> conservative mgmt with rate control and xarelto.  [name] Permanent atrial fibrillation ([place])    a. diagnosed 04/2015 --> conservative mgmt with rate control and xarelto.  [name] Postoperative anemia due to acute blood loss 11/23/2018  . Pulsatile neck mass    a. 11/2015 Neck U/S: pulsatile R neck mass correlates w/ a toruous and mildy ectatic segment of the R SCA measuring ~ 2.1 cm in max diameter - no change since noted on 12/16 CT.  [name] Scapula fracture 05/09/2015  . Severe mitral regurgitation by prior echocardiogram    a. 07/2015 sev MR    Family History  Problem Relation Age of Onset  . Coronary artery disease Father   . Stroke Mother     Past Surgical History:  Procedure Laterality Date  . ABDOMINAL HYSTERECTOMY    . BREAST LUMPECTOMY    . FRACTURE SURGERY     L femur  . JOINT REPLACEMENT     knees  . ORIF HIP FRACTURE Left 11/21/2018   Procedure: OPEN REDUCTION INTERNAL FIXATION  OF THE LEFT HIP;  Surgeon: Shona Needles, MD;  Location: [facility];  Service: Orthopedics;  Laterality: Left;   Social History   Occupational History  . Not on file  Tobacco Use  . Smoking status: Never Smoker  . Smokeless tobacco: Never Used  Substance and Sexual Activity  . Alcohol use: No    Alcohol/week: 0.0 standard drinks  . Drug use: Yes  . Sexual activity: Never

## 2019-09-22 ENCOUNTER — Telehealth: Payer: Self-pay | Admitting: Cardiology

## 2019-09-22 MED ORDER — CARVEDILOL 6.25 MG PO TABS: 6.2500 mg | ORAL_TABLET | Freq: Two times a day (BID) | ORAL | 3 refills | Status: AC

## 2019-09-22 NOTE — Telephone Encounter (Signed)
Spoke with pt granddaughter, Aware of dr Jacalyn Lefevre recommendations. New script sent to the pharmacy

## 2019-09-22 NOTE — Telephone Encounter (Signed)
   STAT if HR is under 50 or over 120 (normal HR is 60-100 beats per minute)  1) What is your heart rate? 60  2) Do you have a log of your heart rate readings (document readings)? For the last few days pt's HR between 40 to 60  3) Do you have any other symptoms? Pt's daughter calling, she said pt HR has been low, she seems weak and coughing a little, she said to call the pt directly to be able to explain more what she's feeling  Please call

## 2019-09-22 NOTE — Telephone Encounter (Signed)
Change carvedilol to 6.25 mg twice daily and follow blood pressure and pulse. Kirk Ruths

## 2019-09-22 NOTE — Telephone Encounter (Signed)
Spoke with patient and he heart rate has been running low, O2 sat 91-92 % and HR 54. Has been running in the 50's  Denies being dizzy Is feeling shortness of breath up moving around, but this is no change.  Feeling tired and "just not good"  No blood pressure reading available  Weight was up so she took extra Torsemide for few days, back to 1 daily now Will forward to Dr Stanford Breed for review

## 2019-10-08 ENCOUNTER — Other Ambulatory Visit: Payer: Self-pay

## 2019-10-08 ENCOUNTER — Ambulatory Visit (INDEPENDENT_AMBULATORY_CARE_PROVIDER_SITE_OTHER): Payer: Medicare Other | Admitting: Orthopedic Surgery

## 2019-10-08 ENCOUNTER — Other Ambulatory Visit: Payer: Self-pay | Admitting: Cardiology

## 2019-10-08 ENCOUNTER — Encounter: Payer: Self-pay | Admitting: Orthopedic Surgery

## 2019-10-08 DIAGNOSIS — M25571 Pain in right ankle and joints of right foot: Secondary | ICD-10-CM

## 2019-10-08 DIAGNOSIS — M659 Synovitis and tenosynovitis, unspecified: Secondary | ICD-10-CM

## 2019-10-08 MED ORDER — DILTIAZEM HCL ER COATED BEADS 120 MG PO CP24: 120.0000 mg | ORAL_CAPSULE | Freq: Every day | ORAL | 5 refills | Status: DC

## 2019-10-08 NOTE — Progress Notes (Signed)
Office Visit Note   Patient: [name]           Date of Birth: [birth year]           MRN: [id number] Visit Date: 10/08/2019 Requested by: Bernerd Limbo, MD [place],  [place] [id number] PCP: Bernerd Limbo, MD  Subjective: Chief Complaint  Patient presents with  . Right Ankle - Pain    HPI: [name] is a patient with right ankle pain.  Having some heel pain.  Been going on for months.  Interfering with her ability to walk.  She is using a walker which is new.  Has had knee replacement on the right and distal femur replacement on the left.  Tried a brace but it caused her leg to swell.  Steroids she did not tolerate.  She takes vitamin D.  Here with her daughter who states that in general she is having a lot of difficulty getting around.  She also is on Lasix and that has decreased her edema in the bilateral lower extremities but what is left is right ankle swelling.  Prior radiographs from a month ago were reviewed.  No acute findings present.              ROS: All systems reviewed are negative as they relate to the chief complaint within the history of present illness.  Patient denies  fevers or chills.   Assessment & Plan: Visit Diagnoses:  1. Synovitis of ankle     Plan: Impression is right ankle swelling with medial tenderness and functional tendons across the right ankle.  Left ankle definitely smaller in size.  She does not have much if any pitting edema in the legs and feet at this time.  This is a difficult diagnosis.  She may have some ankle synovitis or this could be a medial sided stress reaction on the distal tibia.  She does report having pain with every step.  She is on vitamin D.  This warrants MRI scanning for further evaluation of stress reaction.  No evidence of DVT or other acute pathologic process in the ankle at this time.  Follow-Up Instructions: Return for after MRI.   Orders:  No orders of the defined types were placed in this  encounter.  No orders of the defined types were placed in this encounter.     Procedures: No procedures performed   Clinical Data: No additional findings.  Objective: Vital Signs: There were no vitals taken for this visit.  Physical Exam:   Constitutional: Patient appears well-developed HEENT:  Head: Normocephalic Eyes:EOM are normal Neck: Normal range of motion Cardiovascular: Normal rate Pulmonary/chest: Effort normal Neurologic: Patient is alert Skin: Skin is warm Psychiatric: Patient has normal mood and affect    Ortho Exam: Ortho exam demonstrates palpable pedal pulses bilaterally.  No edema in the forefoot.  No real edema in the right or left leg with negative Homans on the right.  She does have some diffuse ankle swelling and synovitis with mild warmth.  No effusion in the ankle joint.  Medial greater than lateral sided tenderness in the ankle.  Palpable intact nontender anterior to posterior to peroneal and Achilles tendons.  No fluctuance or erythema.  No pain with pronation supination of the forefoot.  Negative squeeze test on the calcaneus.  Negative varus stress testing on the right ankle.  Specialty Comments:  No specialty comments available.  Imaging: No results found.   PMFS History: Patient Active Problem List  Diagnosis Date Noted  . Postoperative anemia due to acute blood loss 11/23/2018  . Closed left hip fracture, initial encounter ([place]) 11/20/2018  . Acute and chronic respiratory failure (acute-on-chronic) ([place]) 12/23/2017  . CKD (chronic kidney disease), stage V ([place]) 12/23/2017  . Essential hypertension   . HLD (hyperlipidemia)   . PAF (paroxysmal atrial fibrillation) ([place])   . Cardiomyopathy ([place])   . Mitral regurgitation 08/24/2015  . Congestive dilated cardiomyopathy ([place]) 08/24/2015  . Congestive heart failure (CHF) ([place]) 08/24/2015  . Acute on chronic congestive heart failure ([place])   . Dyspnea 08/02/2015  . AKI (acute kidney injury)  ([place]) 05/10/2015  .  Rib fractures - right #8-10 05/09/2015  . Atrial fibrillation ([place]) 05/09/2015  . Scapula fracture 05/09/2015  . Fall 05/09/2015  . Essential hypertension, benign 05/09/2015  . Diabetes mellitus without complication ([place]) 54/65/6812  . History of breast cancer 05/09/2015   Past Medical History:  Diagnosis Date  .  Rib fractures - right #8-10 05/09/2015  . Acute and chronic respiratory failure (acute-on-chronic) ([place]) 12/23/2017  . Acute on chronic congestive heart failure ([place])   . AKI (acute kidney injury) ([place]) 05/10/2015  . Atrial fibrillation ([place]) 05/09/2015  . Cancer [facility])    breast cancer  . Cardiomyopathy ([place])    a. 04/2015 Echo: EF 55-60%;  b. 07/2015 Echo: EF 35-40%, mod LVH, antsep DK, mod AI, sev MR, sev dil LA, mildly dil RA, mild-mod TR, PASP 1mmHg-->conservatively managed.  . CHF exacerbation ([place]) 08/02/2015  . Chronic systolic CHF (congestive heart failure) ([place])    a. 07/2015 Echo: EF 35-40%.  . CKD (chronic kidney disease), stage IV ([place])   . CKD (chronic kidney disease), stage V ([place]) 12/23/2017  . Closed left hip fracture, initial encounter ([place]) 11/20/2018  . Congestive dilated cardiomyopathy ([place]) 08/24/2015  . Congestive heart failure (CHF) ([place]) 08/24/2015  . Diabetes mellitus without complication ([place])   . Dyspnea 08/02/2015  . Elevated lactic acid level 08/02/2015  . Essential hypertension   . Fall 05/09/2015  . History of breast cancer 05/09/2015  . HLD (hyperlipidemia)   . Mitral regurgitation 08/24/2015  . PAF (paroxysmal atrial fibrillation) ([place])    a. diagnosed 04/2015 --> conservative mgmt with rate control and xarelto.  [name] Permanent atrial fibrillation ([place])    a. diagnosed 04/2015 --> conservative mgmt with rate control and xarelto.  [name] Postoperative anemia due to acute blood loss 11/23/2018  . Pulsatile neck mass    a. 11/2015 Neck U/S: pulsatile R neck mass correlates w/ a toruous and mildy ectatic segment of the R SCA measuring ~  2.1 cm in max diameter - no change since noted on 12/16 CT.  [name] Scapula fracture 05/09/2015  . Severe mitral regurgitation by prior echocardiogram    a. 07/2015 sev MR    Family History  Problem Relation Age of Onset  . Coronary artery disease Father   . Stroke Mother     Past Surgical History:  Procedure Laterality Date  . ABDOMINAL HYSTERECTOMY    . BREAST LUMPECTOMY    . FRACTURE SURGERY     L femur  . JOINT REPLACEMENT     knees  . ORIF HIP FRACTURE Left 11/21/2018   Procedure: OPEN REDUCTION INTERNAL FIXATION  OF THE LEFT HIP;  Surgeon: Shona Needles, MD;  Location: [facility];  Service: Orthopedics;  Laterality: Left;   Social History   Occupational History  . Not on file  Tobacco Use  . Smoking status: Never Smoker  .  Smokeless tobacco: Never Used  Substance and Sexual Activity  . Alcohol use: No    Alcohol/week: 0.0 standard drinks  . Drug use: Yes  . Sexual activity: Never

## 2019-10-08 NOTE — Telephone Encounter (Signed)
New Message   *STAT* If patient is at the pharmacy, call can be transferred to refill team.   1. Which medications need to be refilled? (please list name of each medication and dose if known) diltiazem (CARDIZEM CD) 120 MG 24 hr capsule  2. Which pharmacy/location (including street and city if local pharmacy) is medication to be sent to? [id number] [place], [place] [address].  3. Do they need a 30 day or 90 day supply? 30 day      *STAT* If patient is at the pharmacy, call can be transferred to refill team.   1. Which medications need to be refilled? (please list name of each medication and dose if known) diltiazem (CARDIZEM CD) 120 MG 24 hr capsule  2. Which pharmacy/location (including street and city if local pharmacy) is medication to be sent to? [facility] - [place], [address]  3. Do they need a 30 day or 90 day supply? 90 day

## 2019-10-10 ENCOUNTER — Other Ambulatory Visit: Payer: Self-pay | Admitting: Cardiology

## 2019-10-10 MED ORDER — DILTIAZEM HCL ER COATED BEADS 120 MG PO CP24: 120.0000 mg | ORAL_CAPSULE | Freq: Every day | ORAL | 5 refills | Status: DC

## 2019-10-10 NOTE — Progress Notes (Signed)
Virtual Visit via Video Note   This visit type was conducted due to national recommendations for restrictions regarding the COVID-19 Pandemic (e.g. social distancing) in an effort to limit this patient's exposure and mitigate transmission in our community.  Due to her co-morbid illnesses, this patient is at least at moderate risk for complications without adequate follow up.  This format is felt to be most appropriate for this patient at this time.  All issues noted in this document were discussed and addressed.  A limited physical exam was performed with this format.  Please refer to the patient's chart for her consent to telehealth for [facility].   Date:  10/16/2019   ID:  [name], DOB 21-Feb-1926, MRN [id number]  Patient Location:Home Provider Location: Home  PCP:  Bernerd Limbo, MD  Cardiologist:  Dr Stanford Breed  Evaluation Performed:  Follow-Up Visit  Chief Complaint:  FU atrial fibrillation and CHF  History of Present Illness:    Follow-up atrial fibrillation and congestive heart failure. Had fall 12/16. She had rib fractures and fracture of her right scapula. She was treated conservatively. She was noted to be in atrial fibrillation at that time. Echocardiogram revealed Ejection fraction 55-60%, mild aortic insufficiency and mild mitral regurgitation. She was treated with rate control and discharged. Reeadmitted 3/17 with CHF. Echocardiogram repeated and showed ejection fraction 35-40%, biatrial enlargement, moderate aortic insufficiency, severe mitral regurgitation and mild to moderate tricuspid regurgitation. Patienthas requested only conservative measures given her age which we thought was appropriate. She also has renal insufficiency.Patient fell June 2020 and suffered left hip fracture requiring surgical intervention. Since last seen,patient has rare dyspnea. Occasional pain in her left chest area but no exertional symptoms. Her pedal edema is well controlled. No  syncope or bleeding.  The patient does not have symptoms concerning for COVID-19 infection (fever, chills, cough, or new shortness of breath).    Past Medical History:  Diagnosis Date  .  Rib fractures - right #8-10 05/09/2015  . Acute and chronic respiratory failure (acute-on-chronic) ([place]) 12/23/2017  . Acute on chronic congestive heart failure ([place])   . AKI (acute kidney injury) ([place]) 05/10/2015  . Atrial fibrillation ([place]) 05/09/2015  . Cancer [facility]    breast cancer  . Cardiomyopathy ([place])    a. 04/2015 Echo: EF 55-60%;  b. 07/2015 Echo: EF 35-40%, mod LVH, antsep DK, mod AI, sev MR, sev dil LA, mildly dil RA, mild-mod TR, PASP 4mmHg-->conservatively managed.  . CHF exacerbation ([place]) 08/02/2015  . Chronic systolic CHF (congestive heart failure) ([place])    a. 07/2015 Echo: EF 35-40%.  . CKD (chronic kidney disease), stage IV ([place])   . CKD (chronic kidney disease), stage V ([place]) 12/23/2017  . Closed left hip fracture, initial encounter ([place]) 11/20/2018  . Congestive dilated cardiomyopathy ([place]) 08/24/2015  . Congestive heart failure (CHF) ([place]) 08/24/2015  . Diabetes mellitus without complication ([place])   . Dyspnea 08/02/2015  . Elevated lactic acid level 08/02/2015  . Essential hypertension   . Fall 05/09/2015  . History of breast cancer 05/09/2015  . HLD (hyperlipidemia)   . Mitral regurgitation 08/24/2015  . PAF (paroxysmal atrial fibrillation) ([place])    a. diagnosed 04/2015 --> conservative mgmt with rate control and xarelto.  [name] Permanent atrial fibrillation ([place])    a. diagnosed 04/2015 --> conservative mgmt with rate control and xarelto.  [name] Postoperative anemia due to acute blood loss 11/23/2018  . Pulsatile neck mass    a. 11/2015 Neck U/S: pulsatile R neck mass  correlates w/ a toruous and mildy ectatic segment of the R SCA measuring ~ 2.1 cm in max diameter - no change since noted on 12/16 CT.  [name] Scapula fracture 05/09/2015  . Severe mitral regurgitation by prior echocardiogram    a.  07/2015 sev MR   Past Surgical History:  Procedure Laterality Date  . ABDOMINAL HYSTERECTOMY    . BREAST LUMPECTOMY    . FRACTURE SURGERY     L femur  . JOINT REPLACEMENT     knees  . ORIF HIP FRACTURE Left 11/21/2018   Procedure: OPEN REDUCTION INTERNAL FIXATION  OF THE LEFT HIP;  Surgeon: Shona Needles, MD;  Location: [facility];  Service: Orthopedics;  Laterality: Left;     Current Meds  Medication Sig  . acetaminophen (TYLENOL) 500 MG tablet Take 1,000 mg by mouth every 6 (six) hours as needed for headache (pain).   [name] apixaban (ELIQUIS) 2.5 MG TABS tablet Take 1 tablet (2.5 mg total) by mouth 2 (two) times daily.  . carvedilol (COREG) 6.25 MG tablet Take 1 tablet (6.25 mg total) by mouth 2 (two) times daily with a meal.  . diclofenac Sodium (VOLTAREN) 1 % GEL Apply 2 g topically 4 (four) times daily.  [name] diltiazem (CARDIZEM CD) 120 MG 24 hr capsule Take 1 capsule (120 mg total) by mouth daily.  . ferrous sulfate 325 (65 FE) MG tablet Take 325 mg by mouth 2 (two) times daily with a meal.   . hydrALAZINE (APRESOLINE) 10 MG tablet Take 1 tablet (10 mg total) by mouth 3 (three) times daily.  . isosorbide mononitrate (IMDUR) 30 MG 24 hr tablet Take 1 tablet (30 mg total) by mouth daily.  [name] LORazepam (ATIVAN) 0.5 MG tablet Take 0.25 mg by mouth at bedtime.   . LUTEIN PO Take 25 mg by mouth daily.   [name] LYRICA 25 MG capsule Take 25 mg by mouth every evening.   . OXYGEN Inhale 3 L into the lungs See admin instructions. Use every night at bedtime and during the day if needed for shortness of breath  . polyethylene glycol (MIRALAX / GLYCOLAX) packet Take 17 g by mouth daily.   [name] Resveratrol 250 MG CAPS Take 250 mg by mouth daily.  . saxagliptin HCl (ONGLYZA) 2.5 MG TABS tablet Take 2.5 mg by mouth daily.   . sodium chloride (MURO 128) 5 % ophthalmic solution Place 1 drop into the left eye 4 (four) times daily.  [name] torsemide (DEMADEX) 20 MG tablet Take 2 tablets (40 mg) alternating with 20 mg every  other day.  . [DISCONTINUED] calcium carbonate (OS-CAL) 600 MG TABS tablet Take by mouth.     Allergies:   Codeine, Neurontin [gabapentin], and Oxycodone   Social History   Tobacco Use  . Smoking status: Never Smoker  . Smokeless tobacco: Never Used  Substance Use Topics  . Alcohol use: No    Alcohol/week: 0.0 standard drinks  . Drug use: Yes     Family Hx: The patient's family history includes Coronary artery disease in her father; Stroke in her mother.  ROS:   Please see the history of present illness.    Patient has right foot pain and is scheduled for an MRI. No Fever, chills  or productive cough All other systems reviewed and are negative.   Recent Labs: 11/21/2018: Magnesium 2.4 12/01/2018: ALT 9 06/17/2019: BUN 79; Creatinine, Ser 2.53; Hemoglobin 11.3; Platelets 247; Potassium 4.9; Sodium 139    Wt Readings from Last 3  Encounters:  10/16/19 132 lb (59.9 kg)  08/21/19 140 lb (63.5 kg)  06/17/19 137 lb 9.6 oz (62.4 kg)     Objective:    Vital Signs:  BP (!) 138/53   Pulse 63   Ht 5' (1.524 m)   Wt 132 lb (59.9 kg)   BMI 25.78 kg/m    VITAL SIGNS:  reviewed NAD Answers questions appropriately Normal affect Remainder of physical examination not performed (telehealth visit; coronavirus pandemic)  ASSESSMENT & PLAN:    1. Chronic combined systolic/diastolic congestive heart failure-by history patient is doing well from a volume standpoint.  Continue Demadex at present dose.  Check potassium and renal function. 2. Permanent atrial fibrillation-continue carvedilol and Cardizem for rate control.  Continue apixaban. Check hemoglobin. 3. Cardiomyopathy-patient has previously declined further evaluation and does not want aggressive testing or other measures.  She requests only medical therapy.  Continue hydralazine/nitrates and beta-blocker.  No ARB or Entresto given baseline renal insufficiency. 4. Mitral regurgitation-patient only wants medical therapy and no  aggressive measures.  We therefore have elected not to pursue follow-up echocardiograms. 5. Chronic stage iV kidney disease-followed by nephrology. 6. No CODE BLUE  COVID-19 Education: The importance of social distancing was discussed today.  Time:   Today, I have spent 16 minutes with the patient with telehealth technology discussing the above problems.     Medication Adjustments/Labs and Tests Ordered: Current medicines are reviewed at length with the patient today.  Concerns regarding medicines are outlined above.   Tests Ordered: No orders of the defined types were placed in this encounter.   Medication Changes: No orders of the defined types were placed in this encounter.   Follow Up:  Either In Person or Virtual in 6 month(s)  Signed, Kirk Ruths, MD  10/16/2019 8:04 AM    [place]

## 2019-10-10 NOTE — Telephone Encounter (Signed)
*  STAT* If patient is at the pharmacy, call can be transferred to refill team.   1. Which medications need to be refilled? (please list name of each medication and dose if known) diltiazem (CARDIZEM CD) 120 MG 24 hr capsule  2. Which pharmacy/location (including street and city if local pharmacy) is medication to be sent to? [id number] [place] [place] [address]  3. Do they need a 30 day or 90 day supply? 12 days  Patient has one day left. Refill was sent to [facility], but will take at least 10 days to arrive so she is requesting enough medication to make it through[facility]

## 2019-10-16 ENCOUNTER — Encounter: Payer: Self-pay | Admitting: Cardiology

## 2019-10-16 ENCOUNTER — Other Ambulatory Visit: Payer: Self-pay | Admitting: Surgical

## 2019-10-16 ENCOUNTER — Telehealth: Payer: Self-pay | Admitting: Orthopedic Surgery

## 2019-10-16 ENCOUNTER — Telehealth (INDEPENDENT_AMBULATORY_CARE_PROVIDER_SITE_OTHER): Payer: Medicare Other | Admitting: Cardiology

## 2019-10-16 VITALS — BP 138/53 | HR 63 | Ht 60.0 in | Wt 132.0 lb

## 2019-10-16 DIAGNOSIS — I5042 Chronic combined systolic (congestive) and diastolic (congestive) heart failure: Secondary | ICD-10-CM | POA: Diagnosis not present

## 2019-10-16 DIAGNOSIS — I340 Nonrheumatic mitral (valve) insufficiency: Secondary | ICD-10-CM

## 2019-10-16 DIAGNOSIS — I10 Essential (primary) hypertension: Secondary | ICD-10-CM

## 2019-10-16 DIAGNOSIS — I4821 Permanent atrial fibrillation: Secondary | ICD-10-CM

## 2019-10-16 DIAGNOSIS — I420 Dilated cardiomyopathy: Secondary | ICD-10-CM

## 2019-10-16 MED ORDER — HYDROCODONE-ACETAMINOPHEN 5-325 MG PO TABS: 1.0000 | ORAL_TABLET | Freq: Two times a day (BID) | ORAL | 0 refills | Status: DC | PRN

## 2019-10-16 NOTE — Telephone Encounter (Signed)
Pt called in stating she's in a lot of pain and doesn't go in for her MRI until 11/06/19 and would like to know if she could be prescribed something to help the pain.   [phone]

## 2019-10-16 NOTE — Telephone Encounter (Signed)
IC advised.  

## 2019-10-16 NOTE — Telephone Encounter (Signed)
Pls advise. Thanks.  

## 2019-10-16 NOTE — Telephone Encounter (Signed)
Sent in RX for norco

## 2019-10-16 NOTE — Patient Instructions (Signed)
Medication Instructions:  NO CHANGE *If you need a refill on your cardiac medications before your next appointment, please call your pharmacy*   Lab Work: Your physician recommends that you return for lab work WHEN CONVENIENT  If you have labs (blood work) drawn today and your tests are completely normal, you will receive your results only by: [name] MyChart Message (if you have MyChart) OR . A paper copy in the mail If you have any lab test that is abnormal or we need to change your treatment, we will call you to review the results.   Follow-Up: At [facility], you and your health needs are our priority.  As part of our continuing mission to provide you with exceptional heart care, we have created designated Provider Care Teams.  These Care Teams include your primary Cardiologist (physician) and Advanced Practice Providers (APPs -  Physician Assistants and Nurse Practitioners) who all work together to provide you with the care you need, when you need it.  We recommend signing up for the patient portal called "MyChart".  Sign up information is provided on this After Visit Summary.  MyChart is used to connect with patients for Virtual Visits (Telemedicine).  Patients are able to view lab/test results, encounter notes, upcoming appointments, etc.  Non-urgent messages can be sent to your provider as well.   To learn more about what you can do with MyChart, go to [email or web address].    Your next appointment:   6 month(s)  The format for your next appointment:   In Person  Provider:   You may see Kirk Ruths MD or one of the following Advanced Practice Providers on your designated Care Team:    Kerin Ransom, PA-C  [place], [place]  Coletta Memos, [place]

## 2019-10-20 ENCOUNTER — Ambulatory Visit (INDEPENDENT_AMBULATORY_CARE_PROVIDER_SITE_OTHER): Payer: Medicare Other | Admitting: Podiatry

## 2019-10-20 ENCOUNTER — Encounter: Payer: Self-pay | Admitting: Podiatry

## 2019-10-20 ENCOUNTER — Other Ambulatory Visit: Payer: Self-pay

## 2019-10-20 DIAGNOSIS — L84 Corns and callosities: Secondary | ICD-10-CM | POA: Diagnosis not present

## 2019-10-20 DIAGNOSIS — B351 Tinea unguium: Secondary | ICD-10-CM

## 2019-10-20 DIAGNOSIS — E119 Type 2 diabetes mellitus without complications: Secondary | ICD-10-CM

## 2019-10-20 DIAGNOSIS — M79674 Pain in right toe(s): Secondary | ICD-10-CM

## 2019-10-20 DIAGNOSIS — M79675 Pain in left toe(s): Secondary | ICD-10-CM | POA: Diagnosis not present

## 2019-10-20 NOTE — Patient Instructions (Signed)
Diabetes Mellitus and Foot Care Foot care is an important part of your health, especially when you have diabetes. Diabetes may cause you to have problems because of poor blood flow (circulation) to your feet and legs, which can cause your skin to:  Become thinner and drier.  Break more easily.  Heal more slowly.  Peel and crack. You may also have nerve damage (neuropathy) in your legs and feet, causing decreased feeling in them. This means that you may not notice minor injuries to your feet that could lead to more serious problems. Noticing and addressing any potential problems early is the best way to prevent future foot problems. How to care for your feet Foot hygiene  Wash your feet daily with warm water and mild soap. Do not use hot water. Then, pat your feet and the areas between your toes until they are completely dry. Do not soak your feet as this can dry your skin.  Trim your toenails straight across. Do not dig under them or around the cuticle. File the edges of your nails with an emery board or nail file.  Apply a moisturizing lotion or petroleum jelly to the skin on your feet and to dry, brittle toenails. Use lotion that does not contain alcohol and is unscented. Do not apply lotion between your toes. Shoes and socks  Wear clean socks or stockings every day. Make sure they are not too tight. Do not wear knee-high stockings since they may decrease blood flow to your legs.  Wear shoes that fit properly and have enough cushioning. Always look in your shoes before you put them on to be sure there are no objects inside.  To break in new shoes, wear them for just a few hours a day. This prevents injuries on your feet. Wounds, scrapes, corns, and calluses  Check your feet daily for blisters, cuts, bruises, sores, and redness. If you cannot see the bottom of your feet, use a mirror or ask someone for help.  Do not cut corns or calluses or try to remove them with medicine.  If you  find a minor scrape, cut, or break in the skin on your feet, keep it and the skin around it clean and dry. You may clean these areas with mild soap and water. Do not clean the area with peroxide, alcohol, or iodine.  If you have a wound, scrape, corn, or callus on your foot, look at it several times a day to make sure it is healing and not infected. Check for: ? Redness, swelling, or pain. ? Fluid or blood. ? Warmth. ? Pus or a bad smell. General instructions  Do not cross your legs. This may decrease blood flow to your feet.  Do not use heating pads or hot water bottles on your feet. They may burn your skin. If you have lost feeling in your feet or legs, you may not know this is happening until it is too late.  Protect your feet from hot and cold by wearing shoes, such as at the beach or on hot pavement.  Schedule a complete foot exam at least once a year (annually) or more often if you have foot problems. If you have foot problems, report any cuts, sores, or bruises to your health care provider immediately. Contact a health care provider if:  You have a medical condition that increases your risk of infection and you have any cuts, sores, or bruises on your feet.  You have an injury that is not   healing.  You have redness on your legs or feet.  You feel burning or tingling in your legs or feet.  You have pain or cramps in your legs and feet.  Your legs or feet are numb.  Your feet always feel cold.  You have pain around a toenail. Get help right away if:  You have a wound, scrape, corn, or callus on your foot and: ? You have pain, swelling, or redness that gets worse. ? You have fluid or blood coming from the wound, scrape, corn, or callus. ? Your wound, scrape, corn, or callus feels warm to the touch. ? You have pus or a bad smell coming from the wound, scrape, corn, or callus. ? You have a fever. ? You have a red line going up your leg. Summary  Check your feet every day  for cuts, sores, red spots, swelling, and blisters.  Moisturize feet and legs daily.  Wear shoes that fit properly and have enough cushioning.  If you have foot problems, report any cuts, sores, or bruises to your health care provider immediately.  Schedule a complete foot exam at least once a year (annually) or more often if you have foot problems. This information is not intended to replace advice given to you by your health care provider. Make sure you discuss any questions you have with your health care provider. Document Revised: 02/05/2019 Document Reviewed: 06/16/2016 Elsevier Patient Education  2020 [employer]  

## 2019-10-22 ENCOUNTER — Encounter (HOSPITAL_COMMUNITY): Payer: Self-pay | Admitting: *Deleted

## 2019-10-22 ENCOUNTER — Other Ambulatory Visit: Payer: Self-pay

## 2019-10-22 ENCOUNTER — Emergency Department (HOSPITAL_COMMUNITY)
Admission: EM | Admit: 2019-10-22 | Discharge: 2019-10-22 | Disposition: A | Discharge status: Home / Self Care | Payer: Medicare Other | Attending: Emergency Medicine | Admitting: Emergency Medicine

## 2019-10-22 ENCOUNTER — Emergency Department (HOSPITAL_COMMUNITY): Payer: Medicare Other

## 2019-10-22 DIAGNOSIS — Y939 Activity, unspecified: Secondary | ICD-10-CM | POA: Diagnosis not present

## 2019-10-22 DIAGNOSIS — S82891A Other fracture of right lower leg, initial encounter for closed fracture: Secondary | ICD-10-CM | POA: Insufficient documentation

## 2019-10-22 DIAGNOSIS — Z79899 Other long term (current) drug therapy: Secondary | ICD-10-CM | POA: Insufficient documentation

## 2019-10-22 DIAGNOSIS — E1122 Type 2 diabetes mellitus with diabetic chronic kidney disease: Secondary | ICD-10-CM | POA: Insufficient documentation

## 2019-10-22 DIAGNOSIS — N185 Chronic kidney disease, stage 5: Secondary | ICD-10-CM | POA: Insufficient documentation

## 2019-10-22 DIAGNOSIS — I5022 Chronic systolic (congestive) heart failure: Secondary | ICD-10-CM | POA: Insufficient documentation

## 2019-10-22 DIAGNOSIS — R531 Weakness: Secondary | ICD-10-CM | POA: Insufficient documentation

## 2019-10-22 DIAGNOSIS — Y929 Unspecified place or not applicable: Secondary | ICD-10-CM | POA: Diagnosis not present

## 2019-10-22 DIAGNOSIS — Z7901 Long term (current) use of anticoagulants: Secondary | ICD-10-CM | POA: Diagnosis not present

## 2019-10-22 DIAGNOSIS — Y999 Unspecified external cause status: Secondary | ICD-10-CM | POA: Diagnosis not present

## 2019-10-22 DIAGNOSIS — S99911A Unspecified injury of right ankle, initial encounter: Secondary | ICD-10-CM | POA: Diagnosis present

## 2019-10-22 DIAGNOSIS — I132 Hypertensive heart and chronic kidney disease with heart failure and with stage 5 chronic kidney disease, or end stage renal disease: Secondary | ICD-10-CM | POA: Diagnosis not present

## 2019-10-22 DIAGNOSIS — X58XXXA Exposure to other specified factors, initial encounter: Secondary | ICD-10-CM | POA: Diagnosis not present

## 2019-10-22 LAB — CBG MONITORING, ED: Glucose-Capillary: 100 mg/dL — ABNORMAL HIGH (ref 70–99)

## 2019-10-22 LAB — BASIC METABOLIC PANEL
Anion gap: 12 (ref 5–15)
BUN: 92 mg/dL — ABNORMAL HIGH (ref 8–23)
CO2: 18 mmol/L — ABNORMAL LOW (ref 22–32)
Calcium: 9.9 mg/dL (ref 8.9–10.3)
Chloride: 108 mmol/L (ref 98–111)
Creatinine, Ser: 2.71 mg/dL — ABNORMAL HIGH (ref 0.44–1.00)
GFR calc Af Amer: 17 mL/min — ABNORMAL LOW (ref >60)
GFR calc non Af Amer: 15 mL/min — ABNORMAL LOW (ref >60)
Glucose, Bld: 110 mg/dL — ABNORMAL HIGH (ref 70–99)
Potassium: 4.8 mmol/L (ref 3.5–5.1)
Sodium: 138 mmol/L (ref 135–145)

## 2019-10-22 LAB — CBC WITH DIFFERENTIAL/PLATELET
Abs Immature Granulocytes: 0.04 10*3/uL (ref 0.00–0.07)
Basophils Absolute: 0.1 10*3/uL (ref 0.0–0.1)
Basophils Relative: 1 %
Eosinophils Absolute: 0.6 10*3/uL — ABNORMAL HIGH (ref 0.0–0.5)
Eosinophils Relative: 7 %
HCT: 37.4 % (ref 36.0–46.0)
Hemoglobin: 12.4 g/dL (ref 12.0–15.0)
Immature Granulocytes: 0 %
Lymphocytes Relative: 13 %
Lymphs Abs: 1.2 10*3/uL (ref 0.7–4.0)
MCH: 30.6 pg (ref 26.0–34.0)
MCHC: 33.2 g/dL (ref 30.0–36.0)
MCV: 92.3 fL (ref 80.0–100.0)
Monocytes Absolute: 1.1 10*3/uL — ABNORMAL HIGH (ref 0.1–1.0)
Monocytes Relative: 12 %
Neutro Abs: 6 10*3/uL (ref 1.7–7.7)
Neutrophils Relative %: 67 %
Platelets: 242 10*3/uL (ref 150–400)
RBC: 4.05 MIL/uL (ref 3.87–5.11)
RDW: 15.4 % (ref 11.5–15.5)
WBC: 9 10*3/uL (ref 4.0–10.5)
nRBC: 0 % (ref 0.0–0.2)

## 2019-10-22 LAB — SEDIMENTATION RATE: Sed Rate: 35 mm/hr — ABNORMAL HIGH (ref 0–22)

## 2019-10-22 NOTE — ED Notes (Signed)
Blue top drawn on pt, sent to main lab

## 2019-10-22 NOTE — ED Triage Notes (Signed)
Pt is here with right leg pain that started in her right foot and now is in leg.  Dopplered pulse and foot warm. Pt states Dr. Marlou Sa said she needed and MRI. Symptoms for 3 weeks

## 2019-10-22 NOTE — Discharge Instructions (Addendum)
You were seen in the emergency department for continued right ankle pain that is now radiating up your leg.  You had blood work that did not show any obvious explanation for your symptoms.  Your MRI of your lumbar spine showed significant degenerative changes along with L1 compression fracture.  Neurosurgery did not feel that there was any operative intervention.  Your MRI of your ankle did show some possible nondisplaced fractures and some tenosynovitis.  Please contact Dr. Marlou Sa to review those images and see what his recommendations are.  Return to the emergency department for any worsening or concerning symptoms

## 2019-10-22 NOTE — ED Notes (Signed)
Pt transported to MRI 

## 2019-10-22 NOTE — ED Provider Notes (Signed)
[facility] EMERGENCY DEPARTMENT Provider Note   CSN: [id number] Arrival date & time: 10/22/19  1152     History Chief Complaint  Patient presents with  . Leg Pain    [name] is a 86 y.o. female. She is brought in by her daughter for evaluation of worsening right leg weakness. Is been going on a few months. Started primarily in her right ankle. Saw Dr. Marlou Sa from orthopedics. He set her up for an outpatient right ankle MRI. That isn't until next month. Having progressively increased weakness and pain in the right leg and today could not walk. No pain at rest. Using a walker and oral narcotics without any improvement. Per Dr. Randel Pigg note had trialed on steroids but could not tolerate them. Plain xrays negative in past.   The history is provided by the patient and a relative.  Leg Pain Location:  Ankle and leg Injury: no   Leg location:  R lower leg Ankle location:  R ankle Pain details:    Quality:  Aching   Severity:  Moderate   Onset quality:  Gradual   Timing:  Constant   Progression:  Worsening Chronicity:  New Dislocation: no   Relieved by:  Nothing Worsened by:  Bearing weight Ineffective treatments: narcotics, steroids. Associated symptoms: swelling   Associated symptoms: no back pain, no fever and no numbness        Past Medical History:  Diagnosis Date  .  Rib fractures - right #8-10 05/09/2015  . Acute and chronic respiratory failure (acute-on-chronic) ([place]) 12/23/2017  . Acute on chronic congestive heart failure ([place])   . AKI (acute kidney injury) ([place]) 05/10/2015  . Atrial fibrillation ([place]) 05/09/2015  . Cancer [facility])    breast cancer  . Cardiomyopathy ([place])    a. 04/2015 Echo: EF 55-60%;  b. 07/2015 Echo: EF 35-40%, mod LVH, antsep DK, mod AI, sev MR, sev dil LA, mildly dil RA, mild-mod TR, PASP 66mmHg-->conservatively managed.  . CHF exacerbation ([place]) 08/02/2015  . Chronic systolic CHF (congestive heart failure) ([place])    a. 07/2015  Echo: EF 35-40%.  . CKD (chronic kidney disease), stage IV ([place])   . CKD (chronic kidney disease), stage V ([place]) 12/23/2017  . Closed left hip fracture, initial encounter ([place]) 11/20/2018  . Congestive dilated cardiomyopathy ([place]) 08/24/2015  . Congestive heart failure (CHF) ([place]) 08/24/2015  . Diabetes mellitus without complication ([place])   . Dyspnea 08/02/2015  . Elevated lactic acid level 08/02/2015  . Essential hypertension   . Fall 05/09/2015  . History of breast cancer 05/09/2015  . HLD (hyperlipidemia)   . Mitral regurgitation 08/24/2015  . PAF (paroxysmal atrial fibrillation) ([place])    a. diagnosed 04/2015 --> conservative mgmt with rate control and xarelto.  [name] Permanent atrial fibrillation ([place])    a. diagnosed 04/2015 --> conservative mgmt with rate control and xarelto.  [name] Postoperative anemia due to acute blood loss 11/23/2018  . Pulsatile neck mass    a. 11/2015 Neck U/S: pulsatile R neck mass correlates w/ a toruous and mildy ectatic segment of the R SCA measuring ~ 2.1 cm in max diameter - no change since noted on 12/16 CT.  [name] Scapula fracture 05/09/2015  . Severe mitral regurgitation by prior echocardiogram    a. 07/2015 sev MR    Patient Active Problem List   Diagnosis Date Noted  . Postoperative anemia due to acute blood loss 11/23/2018  . Closed left hip fracture, initial encounter ([place]) 11/20/2018  .  Acute and chronic respiratory failure (acute-on-chronic) ([place]) 12/23/2017  . CKD (chronic kidney disease), stage V ([place]) 12/23/2017  . Essential hypertension   . HLD (hyperlipidemia)   . PAF (paroxysmal atrial fibrillation) ([place])   . Cardiomyopathy ([place])   . Mitral regurgitation 08/24/2015  . Congestive dilated cardiomyopathy ([place]) 08/24/2015  . Congestive heart failure (CHF) ([place]) 08/24/2015  . Acute on chronic congestive heart failure ([place])   . Dyspnea 08/02/2015  . AKI (acute kidney injury) ([place]) 05/10/2015  .  Rib fractures - right #8-10 05/09/2015  . Atrial  fibrillation ([place]) 05/09/2015  . Scapula fracture 05/09/2015  . Fall 05/09/2015  . Essential hypertension, benign 05/09/2015  . Diabetes mellitus without complication ([place]) 18/84/1660  . History of breast cancer 05/09/2015    Past Surgical History:  Procedure Laterality Date  . ABDOMINAL HYSTERECTOMY    . BREAST LUMPECTOMY    . FRACTURE SURGERY     L femur  . JOINT REPLACEMENT     knees  . ORIF HIP FRACTURE Left 11/21/2018   Procedure: OPEN REDUCTION INTERNAL FIXATION  OF THE LEFT HIP;  Surgeon: Shona Needles, MD;  Location: [facility];  Service: Orthopedics;  Laterality: Left;     OB History   No obstetric history on file.     Family History  Problem Relation Age of Onset  . Coronary artery disease Father   . Stroke Mother     Social History   Tobacco Use  . Smoking status: Never Smoker  . Smokeless tobacco: Never Used  Substance Use Topics  . Alcohol use: No    Alcohol/week: 0.0 standard drinks  . Drug use: Not Currently    Home Medications Prior to Admission medications   Medication Sig Start Date End Date Taking? Authorizing Provider  acetaminophen (TYLENOL) 500 MG tablet Take 1,000 mg by mouth every 6 (six) hours as needed for headache (pain).     [provider]  apixaban (ELIQUIS) 2.5 MG TABS tablet Take 1 tablet (2.5 mg total) by mouth 2 (two) times daily. 08/27/18   Lelon Perla, MD  carvedilol (COREG) 6.25 MG tablet Take 1 tablet (6.25 mg total) by mouth 2 (two) times daily with a meal. 09/22/19   Crenshaw, Denice Bors, MD  diclofenac Sodium (VOLTAREN) 1 % GEL Apply 2 g topically 4 (four) times daily. 08/21/19   Aundra Dubin, PA-C  diltiazem (CARDIZEM CD) 120 MG 24 hr capsule Take 1 capsule (120 mg total) by mouth daily. 10/10/19   Lelon Perla, MD  ferrous sulfate 325 (65 FE) MG tablet Take 325 mg by mouth 2 (two) times daily with a meal.     [provider]  hydrALAZINE (APRESOLINE) 10 MG tablet Take 1 tablet (10 mg total) by mouth 3  (three) times daily. 06/17/19   Lelon Perla, MD  HYDROcodone-acetaminophen (NORCO/VICODIN) 5-325 MG tablet Take 1 tablet by mouth every 12 (twelve) hours as needed for moderate pain. 10/16/19 10/15/20  Magnant, Charles L, PA-C  isosorbide mononitrate (IMDUR) 30 MG 24 hr tablet Take 1 tablet (30 mg total) by mouth daily. 06/17/19   Lelon Perla, MD  LORazepam (ATIVAN) 0.5 MG tablet Take 0.25 mg by mouth at bedtime.  01/01/18   [provider]  LUTEIN PO Take 25 mg by mouth daily.     [provider]  LYRICA 25 MG capsule Take 25 mg by mouth every evening.  12/28/17   [provider]  methylPREDNISolone (MEDROL) 4 MG tablet TAKE  AS DIRECTED SEE ATTACHED SHEET 08/21/19   [provider]  mirtazapine (REMERON) 7.5 MG tablet Take 7.5 mg by mouth at bedtime. 09/09/19   [provider]  OXYGEN Inhale 3 L into the lungs See admin instructions. Use every night at bedtime and during the day if needed for shortness of breath    [provider]  polyethylene glycol (MIRALAX / GLYCOLAX) packet Take 17 g by mouth daily.  05/13/15   [provider]  Resveratrol 250 MG CAPS Take 250 mg by mouth daily.    [provider]  saxagliptin HCl (ONGLYZA) 2.5 MG TABS tablet Take 2.5 mg by mouth daily.  06/05/16   [provider]  sodium chloride (MURO 128) 5 % ophthalmic solution Place 1 drop into the left eye 4 (four) times daily.    [provider]  torsemide (DEMADEX) 20 MG tablet Take 2 tablets (40 mg) alternating with 20 mg every other day. 02/06/19   Lelon Perla, MD    Allergies    Codeine, Neurontin [gabapentin], and Oxycodone  Review of Systems   Review of Systems  Constitutional: Negative for fever.  HENT: Negative for sore throat.   Eyes: Negative for pain.  Respiratory: Negative for shortness of breath.   Cardiovascular: Negative for chest pain.  Gastrointestinal: Negative for abdominal pain.  Genitourinary:  Negative for dysuria.  Musculoskeletal: Positive for gait problem. Negative for back pain.  Skin: Negative for rash.  Neurological: Negative for headaches.    Physical Exam Updated Vital Signs BP (!) 154/85 (BP Location: Right Arm)   Pulse 85   Temp 97.9 F (36.6 C) (Oral)   Resp 16   SpO2 96%   Physical Exam Vitals and nursing note reviewed.  Constitutional:      General: She is not in acute distress.    Appearance: She is well-developed.  HENT:     Head: Normocephalic and atraumatic.  Eyes:     Conjunctiva/sclera: Conjunctivae normal.  Cardiovascular:     Rate and Rhythm: Normal rate. Rhythm irregular.     Heart sounds: No murmur.  Pulmonary:     Effort: Pulmonary effort is normal. No respiratory distress.     Breath sounds: Normal breath sounds.  Abdominal:     Palpations: Abdomen is soft.     Tenderness: There is no abdominal tenderness.  Musculoskeletal:        General: No tenderness.     Cervical back: Neck supple.     Right lower leg: Edema present.     Comments: She is some swelling about the right ankle but no particular warmth or erythema. Distal pulses and cap refill brisk.  Skin:    General: Skin is warm and dry.     Capillary Refill: Capillary refill takes less than 2 seconds.  Neurological:     General: No focal deficit present.     Mental Status: She is alert.     Sensory: No sensory deficit.     Motor: Weakness present.     Comments: Upper extremity strength preserved. Lower extremity she is weak on hip flexion. Good strength with ankle extension and ankle dorsiflexion. Normal patellar reflex.      ED Results / Procedures / Treatments   Labs (all labs ordered are listed, but only abnormal results are displayed) Labs Reviewed  BASIC METABOLIC PANEL - Abnormal; Notable for the following components:      Result Value   CO2 18 (*)    Glucose, Bld 110 (*)  BUN 92 (*)    Creatinine, Ser 2.71 (*)    GFR calc non Af Amer 15 (*)    GFR calc Af  Amer 17 (*)    All other components within normal limits  CBC WITH DIFFERENTIAL/PLATELET - Abnormal; Notable for the following components:   Monocytes Absolute 1.1 (*)    Eosinophils Absolute 0.6 (*)    All other components within normal limits  SEDIMENTATION RATE - Abnormal; Notable for the following components:   Sed Rate 35 (*)    All other components within normal limits  CBG MONITORING, ED - Abnormal; Notable for the following components:   Glucose-Capillary 100 (*)    All other components within normal limits    EKG None  Radiology MR Lumbar Spine Wo Contrast  Result Date: 10/22/2019 CLINICAL DATA:  Initial evaluation for right lower extremity pain, lumbar radiculopathy. EXAM: MRI LUMBAR SPINE WITHOUT CONTRAST TECHNIQUE: Multiplanar, multisequence MR imaging of the lumbar spine was performed. No intravenous contrast was administered. COMPARISON:  None available. FINDINGS: Segmentation: Standard. Lowest well-formed disc space labeled the L5-S1 level. Alignment: Trace scoliotic curvature. 4 mm retrolisthesis of L2 on L3. Trace 2 mm retrolisthesis of L3 on L4 and L4 on L5, with trace anterolisthesis of L5 on S1. Findings chronic and facet mediated. Vertebrae: Severe chronic compression deformity involving the L2 vertebral body with near complete height loss/vertebral plana. Associated mild 3 mm bony retropulsion. This is benign/mechanical in appearance. Otherwise, vertebral body height maintained without evidence for acute or chronic fracture. Underlying bone marrow signal intensity within normal limits. Few scattered benign hemangiomata noted, most prominent of which measures 14 mm within the T11 vertebral body. No other discrete or worrisome osseous lesions. Discogenic reactive endplate changes present about the L3-4 interspace. No other abnormal marrow edema. Conus medullaris and cauda equina: Conus extends to the L1 level. Conus and cauda equina appear normal. Paraspinal and other soft  tissues: Paraspinous soft tissues demonstrate no acute finding. Asymmetric fatty atrophy noted within the left psoas muscle. Few tiny cysts noted within the kidneys bilaterally. Visualized visceral structures otherwise unremarkable. Disc levels: L1-2: Diffuse disc bulge with disc desiccation. Large prominent annular fissure noted anteriorly. Up to 3 mm bony retropulsion related to the chronic L2 compression fracture. Mild facet hypertrophy. Resultant mild spinal stenosis. Foramina remain patent. L2-3: 4 mm retrolisthesis. Diffuse disc bulge with disc desiccation and intervertebral disc space narrowing. Reactive endplate changes with marginal endplate spurring, most notable on the left. Moderate bilateral facet hypertrophy. No significant spinal stenosis. Mild right worse than left foraminal narrowing. L3-4: Chronic intervertebral disc space narrowing with diffuse disc bulge and disc desiccation. Reactive endplate changes with marginal endplate osteophytic spurring. Mild to moderate bilateral facet hypertrophy, greater on the left. Resultant moderate canal with moderate left worse than right lateral recess stenosis. Foramina remain patent. L4-5: Diffuse disc bulge with disc desiccation and intervertebral disc space narrowing. Mild to moderate facet hypertrophy. No significant spinal stenosis. Foramina remain patent. L5-S1: Diffuse disc bulge with disc desiccation. Moderate bilateral facet hypertrophy. No significant canal or lateral recess stenosis. Foramina remain patent. IMPRESSION: 1. Severe chronic compression deformity involving the L2 vertebral body with near complete height loss anteriorly. Associated mild 3 mm bony retropulsion with resultant mild spinal stenosis. This is benign/mechanical in appearance. 2. Disc bulging with facet hypertrophy at L3-4 with resultant moderate canal and left worse than right lateral recess stenosis. 3. Additional mild noncompressive disc bulging at L2-3, L4-5 and L5-S1 without  significant stenosis or neural  impingement. Electronically Signed   By: Jeannine Boga M.D.   On: 10/22/2019 19:33   MR ANKLE RIGHT WO CONTRAST  Result Date: 10/23/2019 CLINICAL DATA:  Three-week history of ankle pain. EXAM: MRI OF THE RIGHT ANKLE WITHOUT CONTRAST TECHNIQUE: Multiplanar, multisequence MR imaging of the ankle was performed. No intravenous contrast was administered. COMPARISON:  Radiographs 08/21/2019 FINDINGS: TENDONS Peroneal: Intact Posteromedial: Intact Anterior: Intact Achilles: Normal Plantar Fascia: Intact. Moderate changes of plantar fasciitis and a moderate-sized calcaneal heel spur but no complete tear/rupture. LIGAMENTS Lateral: Intact Medial: Intact CARTILAGE Ankle Joint: Moderate degenerative chondrosis, joint space narrowing and subchondral cystic change. Small joint effusion. Band of low T1 and T2 signal intensity in the subarticular region of the tibial plafond without significant surrounding marrow edema is most likely a healed or healing subchondral stress fracture or focus of spontaneous osteonecrosis. Subtalar Joints/Sinus Tarsi: Mild degenerative changes. Fluid and edema in the sinus tarsi but the cervical and interosseous ligaments are intact. The spring ligament is intact. Invagination of vascular tissue noted into the adjacent calcaneus. Bones: Probable stress or insufficiency fracture involving the base of the medial malleolus with significant surrounding marrow edema. No through and through fracture is identified. Subacute/healing fracture involving the distal fibular shaft with callus formation. Other: Mild diffuse soft tissue swelling/edema and mild myositis. IMPRESSION: 1. Probable stress or insufficiency fracture involving the base of the medial malleolus with significant surrounding marrow edema. No through and through fracture is identified. 2. Subacute/healing fracture involving the distal fibular shaft with callus formation. 3. Moderate changes of plantar  fasciitis and a moderate-sized calcaneal heel spur but no complete tear/rupture. 4. Moderate tibiotalar joint degenerative changes. 5. Intact medial and lateral ankle ligaments and tendons. Electronically Signed   By: Marijo Sanes M.D.   On: 10/23/2019 08:08    Procedures Procedures (including critical care time)  Medications Ordered in ED Medications - No data to display  ED Course  I have reviewed the triage vital signs and the nursing notes.  Pertinent labs & imaging results that were available during my care of the patient were reviewed by me and considered in my medical decision making (see chart for details).  Clinical Course as of Oct 23 846  Wed Oct 22, 2019  2107 Discussed with PA Mayran from neurosurgery who said there is no surgical intervention for her spinal   [MB]  2139 I did call over to radiology as her MRI ankle has not been read.  Apparently there are not any [facility] radiologist to read MRIs.  There was a pulmonary reading which was given to me.  Question healing fracture of distal fibula.  Question nondisplaced fracture medial malleolus.  Tenosynovitis of posterior tibial sheath.  I reviewed this with the patient and the daughter.  They asked that she can be discharged and then will follow up with Dr. Marlou Sa regarding these findings.  Have ordered her a cam boot.   [MB]    Clinical Course User Index [MB] Hayden Rasmussen, MD   MDM Rules/Calculators/A&P                     This patient complains of right leg weakness difficulty ambulating; this involves an extensive number of treatment Options and is a complaint that carries with it a high risk of complications and Morbidity. The differential includes radiculopathy, musculoskeletal, vascular (less likely is anticoagulated and has intact distal pulses.)  I ordered, reviewed and interpreted labs, which included CBC with white count normal  hemoglobin.  Chemistry with a low bicarb and elevated creatinine close to baseline.   Sed rate nonspecifically elevated at 35. I ordered imaging studies which included MRI lumbar spine and MRI ankle and I independently    visualized and interpreted imaging which showed old compression fracture significant degenerative disease, ankle with increased inflammation Additional history obtained from patient's daughter Previous records obtained and reviewed in epic including last orthopedic notes from Dr. Marlou Sa  After the interventions stated above, I reevaluated the patient and found patient to be medically stable.  Patient and daughter are very frustrated on how long her work-up is taken.  Daughter states that they have plenty of help at home and would like her to return home.  Will place in cam boot and they are going to reach out to Dr. Marlou Sa tomorrow to review final report of MRI and what his recommendations are.   Final Clinical Impression(s) / ED Diagnoses Final diagnoses:  Closed fracture of right ankle, initial encounter    Rx / DC Orders ED Discharge Orders    None       Hayden Rasmussen, MD 10/23/19 [phone]

## 2019-10-22 NOTE — ED Notes (Signed)
Sign pad unavailable upon discharge. Pt provided with and verbalizes understanding of discharge instructions. A&ox4, ambulatory, provided with CAM boot. Wrist band removed.

## 2019-10-23 ENCOUNTER — Telehealth: Payer: Self-pay

## 2019-10-23 NOTE — Telephone Encounter (Signed)
Please advise 

## 2019-10-23 NOTE — Telephone Encounter (Signed)
Patient would like a Rx refill on Hydrocodone?  Cb# [id number].  Please advise.  Thank you.

## 2019-10-24 ENCOUNTER — Other Ambulatory Visit: Payer: Self-pay | Admitting: Surgical

## 2019-10-24 MED ORDER — HYDROCODONE-ACETAMINOPHEN 5-325 MG PO TABS: 1.0000 | ORAL_TABLET | Freq: Two times a day (BID) | ORAL | 0 refills | Status: AC | PRN

## 2019-10-24 NOTE — Telephone Encounter (Signed)
submitted

## 2019-10-25 NOTE — Progress Notes (Signed)
Subjective: [name] presents today at risk foot care. Pt has h/o NIDDM with chronic kidney disease and painful callus(es) b/l feet and painful mycotic toenails b/l that are difficult to trim. Pain interferes with ambulation. Aggravating factors include wearing enclosed shoe gear. Pain is relieved with periodic professional debridement.   She states she is being followed by Ortho for right ankle injury.  Bernerd Limbo, MD is patient's PCP. Last visit was: 09/23/2019.  Past Medical History:  Diagnosis Date  .  Rib fractures - right #8-10 05/09/2015  . Acute and chronic respiratory failure (acute-on-chronic) ([place]) 12/23/2017  . Acute on chronic congestive heart failure ([place])   . AKI (acute kidney injury) ([place]) 05/10/2015  . Atrial fibrillation ([place]) 05/09/2015  . Cancer [facility])    breast cancer  . Cardiomyopathy ([place])    a. 04/2015 Echo: EF 55-60%;  b. 07/2015 Echo: EF 35-40%, mod LVH, antsep DK, mod AI, sev MR, sev dil LA, mildly dil RA, mild-mod TR, PASP 80mmHg-->conservatively managed.  . CHF exacerbation ([place]) 08/02/2015  . Chronic systolic CHF (congestive heart failure) ([place])    a. 07/2015 Echo: EF 35-40%.  . CKD (chronic kidney disease), stage IV ([place])   . CKD (chronic kidney disease), stage V ([place]) 12/23/2017  . Closed left hip fracture, initial encounter ([place]) 11/20/2018  . Congestive dilated cardiomyopathy ([place]) 08/24/2015  . Congestive heart failure (CHF) ([place]) 08/24/2015  . Diabetes mellitus without complication ([place])   . Dyspnea 08/02/2015  . Elevated lactic acid level 08/02/2015  . Essential hypertension   . Fall 05/09/2015  . History of breast cancer 05/09/2015  . HLD (hyperlipidemia)   . Mitral regurgitation 08/24/2015  . PAF (paroxysmal atrial fibrillation) ([place])    a. diagnosed 04/2015 --> conservative mgmt with rate control and xarelto.  [name] Permanent atrial fibrillation ([place])    a. diagnosed 04/2015 --> conservative mgmt with rate control and xarelto.  [name] Postoperative anemia  due to acute blood loss 11/23/2018  . Pulsatile neck mass    a. 11/2015 Neck U/S: pulsatile R neck mass correlates w/ a toruous and mildy ectatic segment of the R SCA measuring ~ 2.1 cm in max diameter - no change since noted on 12/16 CT.  [name] Scapula fracture 05/09/2015  . Severe mitral regurgitation by prior echocardiogram    a. 07/2015 sev MR     Current Outpatient Medications on File Prior to Visit  Medication Sig Dispense Refill  . acetaminophen (TYLENOL) 500 MG tablet Take 1,000 mg by mouth every 6 (six) hours as needed for headache (pain).     [name] apixaban (ELIQUIS) 2.5 MG TABS tablet Take 1 tablet (2.5 mg total) by mouth 2 (two) times daily. 180 tablet 3  . carvedilol (COREG) 6.25 MG tablet Take 1 tablet (6.25 mg total) by mouth 2 (two) times daily with a meal. 180 tablet 3  . diclofenac Sodium (VOLTAREN) 1 % GEL Apply 2 g topically 4 (four) times daily. 150 g 1  . diltiazem (CARDIZEM CD) 120 MG 24 hr capsule Take 1 capsule (120 mg total) by mouth daily. 30 capsule 5  . ferrous sulfate 325 (65 FE) MG tablet Take 325 mg by mouth 2 (two) times daily with a meal.     . hydrALAZINE (APRESOLINE) 10 MG tablet Take 1 tablet (10 mg total) by mouth 3 (three) times daily. 270 tablet 3  . isosorbide mononitrate (IMDUR) 30 MG 24 hr tablet Take 1 tablet (30 mg total) by mouth daily. 90 tablet 3  . LORazepam (  ATIVAN) 0.5 MG tablet Take 0.25 mg by mouth at bedtime.   0  . LUTEIN PO Take 25 mg by mouth daily.     [name] LYRICA 25 MG capsule Take 25 mg by mouth every evening.   1  . methylPREDNISolone (MEDROL) 4 MG tablet TAKE AS DIRECTED SEE ATTACHED SHEET    . mirtazapine (REMERON) 7.5 MG tablet Take 7.5 mg by mouth at bedtime.    . OXYGEN Inhale 3 L into the lungs See admin instructions. Use every night at bedtime and during the day if needed for shortness of breath    . polyethylene glycol (MIRALAX / GLYCOLAX) packet Take 17 g by mouth daily.     [name] Resveratrol 250 MG CAPS Take 250 mg by mouth daily.    .  saxagliptin HCl (ONGLYZA) 2.5 MG TABS tablet Take 2.5 mg by mouth daily.     . sodium chloride (MURO 128) 5 % ophthalmic solution Place 1 drop into the left eye 4 (four) times daily.    [name] torsemide (DEMADEX) 20 MG tablet Take 2 tablets (40 mg) alternating with 20 mg every other day. 180 tablet 3   No current facility-administered medications on file prior to visit.     Allergies  Allergen Reactions  . Codeine Other (See Comments)    "talks out of her head"   . Neurontin [Gabapentin] Swelling    Leg swelling  . Oxycodone Other (See Comments)    confusion     Objective: [name] is a pleasant 86 y.o. y.o. Patient Race: White or Caucasian [1]  female in NAD. AAO x 3.  Vascular Examination: Capillary refill time to digits immediate b/l. Palpable DP pulses b/l. Palpable PT pulses b/l. Pedal hair absent b/l Skin temperature gradient within normal limits b/l.  Dermatological Examination: Pedal skin with normal turgor, texture and tone bilaterally. No open wounds bilaterally. No interdigital macerations bilaterally. Toenails 1-5 b/l elongated, discolored, dystrophic, thickened, crumbly with subungual debris and tenderness to dorsal palpation. Hyperkeratotic lesion(s) L hallux, R hallux, submet head 1 left foot, submet head 1 right foot and plantar aspect b/l heel pads.  No erythema, no edema, no drainage, no flocculence.  Musculoskeletal: Hallux valgus with bunion deformity noted b/l. Hammertoes noted to the L 5th toe. Deferred exam RLE. Treated by Orthopedics. Muscle strength LLE 5/5 to all groups.  Neurological Examination: Protective sensation intact 5/5 intact bilaterally with 10g monofilament b/l.  Assessment: 1. Pain due to onychomycosis of toenails of both feet   2. Callus   3. Diabetes mellitus without complication ([place])    Plan: -Examined patient. -Toenails 1-5 b/l were debrided in length and girth with sterile nail nippers and dremel without iatrogenic bleeding.   -Callus(es) L hallux, R hallux, submet head 1 left foot, submet head 1 right foot and plantar aspect b/l heel pads pared utilizing sterile scalpel blade without complication or incident. Total number debrided =6. -Patient to continue soft, supportive shoe gear daily. -Patient to report any pedal injuries to medical professional immediately. -Patient/POA to call should there be question/concern in the interim.  Return in about 3 months (around 01/20/2020) for diabetic nail and callus trim.  Marzetta Board, DPM

## 2019-11-03 ENCOUNTER — Ambulatory Visit (INDEPENDENT_AMBULATORY_CARE_PROVIDER_SITE_OTHER): Payer: Medicare Other | Admitting: Orthopedic Surgery

## 2019-11-03 DIAGNOSIS — M84371A Stress fracture, right ankle, initial encounter for fracture: Secondary | ICD-10-CM

## 2019-11-05 ENCOUNTER — Other Ambulatory Visit: Payer: Self-pay | Admitting: Surgical

## 2019-11-05 ENCOUNTER — Telehealth: Payer: Self-pay | Admitting: Orthopedic Surgery

## 2019-11-05 MED ORDER — VITAMIN D (ERGOCALCIFEROL) 1.25 MG (50000 UNIT) PO CAPS: 50000.0000 [IU] | ORAL_CAPSULE | ORAL | 0 refills | Status: AC

## 2019-11-05 NOTE — Telephone Encounter (Signed)
Patient's Daughter Sharyn Lull) called stating that Dr. Marlou Sa wanted her mother to take vitamin D, but she did not remember what mg Dr. Marlou Sa wanted her to get.  Michelle's CB#[phone].  Thank you.

## 2019-11-05 NOTE — Telephone Encounter (Signed)
Submitted RX for vit d to their pharmacy

## 2019-11-05 NOTE — Telephone Encounter (Signed)
Pls advise thanks.  

## 2019-11-06 ENCOUNTER — Other Ambulatory Visit: Payer: Medicare Other

## 2019-11-10 ENCOUNTER — Ambulatory Visit: Payer: Medicare Other | Admitting: Orthopedic Surgery

## 2019-11-14 ENCOUNTER — Encounter: Payer: Self-pay | Admitting: Orthopedic Surgery

## 2019-11-14 NOTE — Progress Notes (Signed)
Office Visit Note   Patient: [name]           Date of Birth: 05-21-1926           MRN: [id number] Visit Date: 11/03/2019 Requested by: Bernerd Limbo, MD [address] [place] [place],  [place] [id number] PCP: Bernerd Limbo, MD  Subjective: Chief Complaint  Patient presents with  . Follow-up    HPI: [name] is a 86 y.o. female who presents to the office complaining of right ankle pain.  She presents for MRI review.  MRI of the right ankle revealed stress/insufficiency fracture involving the base of the medial malleolus with surrounding marrow edema as well as a subacute/healing fracture of the distal fibular shaft with callus formation and moderate tibiotalar degenerative changes.  Patient is taking Norco and pain is improving.  She can bear weight for small periods of time.  She uses a walker at baseline.  She is using over-the-counter vitamin D supplements.  She has a history of diabetes and her blood sugar typically runs a little bit over 100.  She denies any low back pain, numbness/tingling but does admit to occasional radicular pain down the leg about once a week..                ROS:  All systems reviewed are negative as they relate to the chief complaint within the history of present illness.  Patient denies fevers or chills.  Assessment & Plan: Visit Diagnoses: No diagnosis found.  Plan: Patient is a 86 year old female presents complaining of right ankle pain.  MRI of the right ankle was reviewed and is as described above in HPI.  Plan for patient to do partial weight.  The boot.  Avoid weightbearing as she can to allow insufficiency fracture to heal.  Also prescribed vitamin D 50,000 units to take once a week for 6 weeks.  Follow-up in 4 weeks.  Follow-Up Instructions: No follow-ups on file.   Orders:  No orders of the defined types were placed in this encounter.  No orders of the defined types were placed in this encounter.     Procedures: No  procedures performed   Clinical Data: No additional findings.  Objective: Vital Signs: There were no vitals taken for this visit.  Physical Exam:  Constitutional: Patient appears well-developed HEENT:  Head: Normocephalic Eyes:EOM are normal Neck: Normal range of motion Cardiovascular: Normal rate Pulmonary/chest: Effort normal Neurologic: Patient is alert Skin: Skin is warm Psychiatric: Patient has normal mood and affect  Ortho Exam:  Tenderness diffusely around the right ankle.  Active dorsiflexion/plantarflexion intact.  1+ DP pulse.  No pain with passive inversion eversion.  Syndesmosis stable.  Negative syndesmotic squeeze.  Compartments are soft and nontender.  Specialty Comments:  No specialty comments available.  Imaging: No results found.   PMFS History: Patient Active Problem List   Diagnosis Date Noted  . Postoperative anemia due to acute blood loss 11/23/2018  . Closed left hip fracture, initial encounter ([place]) 11/20/2018  . Acute and chronic respiratory failure (acute-on-chronic) ([place]) 12/23/2017  . CKD (chronic kidney disease), stage V ([place]) 12/23/2017  . Essential hypertension   . HLD (hyperlipidemia)   . PAF (paroxysmal atrial fibrillation) ([place])   . Cardiomyopathy ([place])   . Mitral regurgitation 08/24/2015  . Congestive dilated cardiomyopathy ([place]) 08/24/2015  . Congestive heart failure (CHF) ([place]) 08/24/2015  . Acute on chronic congestive heart failure ([place])   . Dyspnea 08/02/2015  . AKI (acute kidney injury) ([place])  05/10/2015  .  Rib fractures - right #8-10 05/09/2015  . Atrial fibrillation ([place]) 05/09/2015  . Scapula fracture 05/09/2015  . Fall 05/09/2015  . Essential hypertension, benign 05/09/2015  . Diabetes mellitus without complication ([place]) 34/91/7915  . History of breast cancer 05/09/2015   Past Medical History:  Diagnosis Date  .  Rib fractures - right #8-10 05/09/2015  . Acute and chronic respiratory failure (acute-on-chronic)  ([place]) 12/23/2017  . Acute on chronic congestive heart failure ([place])   . AKI (acute kidney injury) ([place]) 05/10/2015  . Atrial fibrillation ([place]) 05/09/2015  . Cancer [facility])    breast cancer  . Cardiomyopathy ([place])    a. 04/2015 Echo: EF 55-60%;  b. 07/2015 Echo: EF 35-40%, mod LVH, antsep DK, mod AI, sev MR, sev dil LA, mildly dil RA, mild-mod TR, PASP 67mmHg-->conservatively managed.  . CHF exacerbation ([place]) 08/02/2015  . Chronic systolic CHF (congestive heart failure) ([place])    a. 07/2015 Echo: EF 35-40%.  . CKD (chronic kidney disease), stage IV ([place])   . CKD (chronic kidney disease), stage V ([place]) 12/23/2017  . Closed left hip fracture, initial encounter ([place]) 11/20/2018  . Congestive dilated cardiomyopathy ([place]) 08/24/2015  . Congestive heart failure (CHF) ([place]) 08/24/2015  . Diabetes mellitus without complication ([place])   . Dyspnea 08/02/2015  . Elevated lactic acid level 08/02/2015  . Essential hypertension   . Fall 05/09/2015  . History of breast cancer 05/09/2015  . HLD (hyperlipidemia)   . Mitral regurgitation 08/24/2015  . PAF (paroxysmal atrial fibrillation) ([place])    a. diagnosed 04/2015 --> conservative mgmt with rate control and xarelto.  [name] Permanent atrial fibrillation ([place])    a. diagnosed 04/2015 --> conservative mgmt with rate control and xarelto.  [name] Postoperative anemia due to acute blood loss 11/23/2018  . Pulsatile neck mass    a. 11/2015 Neck U/S: pulsatile R neck mass correlates w/ a toruous and mildy ectatic segment of the R SCA measuring ~ 2.1 cm in max diameter - no change since noted on 12/16 CT.  [name] Scapula fracture 05/09/2015  . Severe mitral regurgitation by prior echocardiogram    a. 07/2015 sev MR    Family History  Problem Relation Age of Onset  . Coronary artery disease Father   . Stroke Mother     Past Surgical History:  Procedure Laterality Date  . ABDOMINAL HYSTERECTOMY    . BREAST LUMPECTOMY    . FRACTURE SURGERY     L femur  . JOINT REPLACEMENT      knees  . ORIF HIP FRACTURE Left 11/21/2018   Procedure: OPEN REDUCTION INTERNAL FIXATION  OF THE LEFT HIP;  Surgeon: Shona Needles, MD;  Location: [facility];  Service: Orthopedics;  Laterality: Left;   Social History   Occupational History  . Not on file  Tobacco Use  . Smoking status: Never Smoker  . Smokeless tobacco: Never Used  Vaping Use  . Vaping Use: Never used  Substance and Sexual Activity  . Alcohol use: No    Alcohol/week: 0.0 standard drinks  . Drug use: Not Currently  . Sexual activity: Never

## 2019-11-18 ENCOUNTER — Encounter (HOSPITAL_COMMUNITY): Payer: Self-pay | Admitting: Emergency Medicine

## 2019-11-18 ENCOUNTER — Inpatient Hospital Stay (HOSPITAL_COMMUNITY)
Admission: EM | Admit: 2019-11-18 | Discharge: 2019-11-21 | DRG: 291 | Disposition: A | Discharge status: Home Health | Payer: Medicare Other | Attending: Internal Medicine | Admitting: Internal Medicine

## 2019-11-18 ENCOUNTER — Other Ambulatory Visit: Payer: Self-pay

## 2019-11-18 ENCOUNTER — Emergency Department (HOSPITAL_COMMUNITY): Payer: Medicare Other

## 2019-11-18 DIAGNOSIS — N185 Chronic kidney disease, stage 5: Secondary | ICD-10-CM | POA: Diagnosis present

## 2019-11-18 DIAGNOSIS — Z66 Do not resuscitate: Secondary | ICD-10-CM | POA: Diagnosis present

## 2019-11-18 DIAGNOSIS — I132 Hypertensive heart and chronic kidney disease with heart failure and with stage 5 chronic kidney disease, or end stage renal disease: Secondary | ICD-10-CM | POA: Diagnosis not present

## 2019-11-18 DIAGNOSIS — I509 Heart failure, unspecified: Secondary | ICD-10-CM

## 2019-11-18 DIAGNOSIS — Z853 Personal history of malignant neoplasm of breast: Secondary | ICD-10-CM | POA: Diagnosis not present

## 2019-11-18 DIAGNOSIS — E1122 Type 2 diabetes mellitus with diabetic chronic kidney disease: Secondary | ICD-10-CM | POA: Diagnosis present

## 2019-11-18 DIAGNOSIS — Z79899 Other long term (current) drug therapy: Secondary | ICD-10-CM | POA: Diagnosis not present

## 2019-11-18 DIAGNOSIS — I5042 Chronic combined systolic (congestive) and diastolic (congestive) heart failure: Secondary | ICD-10-CM

## 2019-11-18 DIAGNOSIS — N179 Acute kidney failure, unspecified: Secondary | ICD-10-CM | POA: Diagnosis present

## 2019-11-18 DIAGNOSIS — I447 Left bundle-branch block, unspecified: Secondary | ICD-10-CM | POA: Diagnosis present

## 2019-11-18 DIAGNOSIS — I5043 Acute on chronic combined systolic (congestive) and diastolic (congestive) heart failure: Secondary | ICD-10-CM | POA: Diagnosis present

## 2019-11-18 DIAGNOSIS — I5023 Acute on chronic systolic (congestive) heart failure: Secondary | ICD-10-CM | POA: Diagnosis not present

## 2019-11-18 DIAGNOSIS — Z8249 Family history of ischemic heart disease and other diseases of the circulatory system: Secondary | ICD-10-CM

## 2019-11-18 DIAGNOSIS — I482 Chronic atrial fibrillation, unspecified: Secondary | ICD-10-CM | POA: Diagnosis not present

## 2019-11-18 DIAGNOSIS — Z20822 Contact with and (suspected) exposure to covid-19: Secondary | ICD-10-CM | POA: Diagnosis present

## 2019-11-18 DIAGNOSIS — I083 Combined rheumatic disorders of mitral, aortic and tricuspid valves: Secondary | ICD-10-CM | POA: Diagnosis present

## 2019-11-18 DIAGNOSIS — E872 Acidosis, unspecified: Secondary | ICD-10-CM

## 2019-11-18 DIAGNOSIS — I4821 Permanent atrial fibrillation: Secondary | ICD-10-CM | POA: Diagnosis present

## 2019-11-18 DIAGNOSIS — Z888 Allergy status to other drugs, medicaments and biological substances status: Secondary | ICD-10-CM

## 2019-11-18 DIAGNOSIS — Z7901 Long term (current) use of anticoagulants: Secondary | ICD-10-CM | POA: Diagnosis not present

## 2019-11-18 DIAGNOSIS — N184 Chronic kidney disease, stage 4 (severe): Secondary | ICD-10-CM

## 2019-11-18 DIAGNOSIS — Z885 Allergy status to narcotic agent status: Secondary | ICD-10-CM

## 2019-11-18 DIAGNOSIS — I420 Dilated cardiomyopathy: Secondary | ICD-10-CM | POA: Diagnosis present

## 2019-11-18 DIAGNOSIS — E118 Type 2 diabetes mellitus with unspecified complications: Secondary | ICD-10-CM

## 2019-11-18 DIAGNOSIS — N289 Disorder of kidney and ureter, unspecified: Secondary | ICD-10-CM | POA: Insufficient documentation

## 2019-11-18 DIAGNOSIS — I10 Essential (primary) hypertension: Secondary | ICD-10-CM | POA: Diagnosis not present

## 2019-11-18 DIAGNOSIS — R062 Wheezing: Secondary | ICD-10-CM

## 2019-11-18 DIAGNOSIS — Z823 Family history of stroke: Secondary | ICD-10-CM | POA: Diagnosis not present

## 2019-11-18 DIAGNOSIS — E785 Hyperlipidemia, unspecified: Secondary | ICD-10-CM | POA: Diagnosis present

## 2019-11-18 DIAGNOSIS — I5021 Acute systolic (congestive) heart failure: Secondary | ICD-10-CM | POA: Diagnosis not present

## 2019-11-18 DIAGNOSIS — I428 Other cardiomyopathies: Secondary | ICD-10-CM | POA: Diagnosis not present

## 2019-11-18 DIAGNOSIS — Z96653 Presence of artificial knee joint, bilateral: Secondary | ICD-10-CM | POA: Diagnosis present

## 2019-11-18 DIAGNOSIS — R0602 Shortness of breath: Secondary | ICD-10-CM

## 2019-11-18 DIAGNOSIS — I429 Cardiomyopathy, unspecified: Secondary | ICD-10-CM | POA: Diagnosis not present

## 2019-11-18 LAB — BASIC METABOLIC PANEL
Anion gap: 16 — ABNORMAL HIGH (ref 5–15)
BUN: 109 mg/dL — ABNORMAL HIGH (ref 8–23)
CO2: 15 mmol/L — ABNORMAL LOW (ref 22–32)
Calcium: 9.8 mg/dL (ref 8.9–10.3)
Chloride: 111 mmol/L (ref 98–111)
Creatinine, Ser: 3.27 mg/dL — ABNORMAL HIGH (ref 0.44–1.00)
GFR calc Af Amer: 14 mL/min — ABNORMAL LOW (ref >60)
GFR calc non Af Amer: 12 mL/min — ABNORMAL LOW (ref >60)
Glucose, Bld: 176 mg/dL — ABNORMAL HIGH (ref 70–99)
Potassium: 4.5 mmol/L (ref 3.5–5.1)
Sodium: 142 mmol/L (ref 135–145)

## 2019-11-18 LAB — BRAIN NATRIURETIC PEPTIDE: B Natriuretic Peptide: 781.6 pg/mL — ABNORMAL HIGH (ref 0.0–100.0)

## 2019-11-18 LAB — CBC
HCT: 34.9 % — ABNORMAL LOW (ref 36.0–46.0)
Hemoglobin: 11.5 g/dL — ABNORMAL LOW (ref 12.0–15.0)
MCH: 30.4 pg (ref 26.0–34.0)
MCHC: 33 g/dL (ref 30.0–36.0)
MCV: 92.3 fL (ref 80.0–100.0)
Platelets: 286 10*3/uL (ref 150–400)
RBC: 3.78 MIL/uL — ABNORMAL LOW (ref 3.87–5.11)
RDW: 15.2 % (ref 11.5–15.5)
WBC: 9.8 10*3/uL (ref 4.0–10.5)
nRBC: 0 % (ref 0.0–0.2)

## 2019-11-18 LAB — GLUCOSE, CAPILLARY: Glucose-Capillary: 155 mg/dL — ABNORMAL HIGH (ref 70–99)

## 2019-11-18 LAB — TROPONIN I (HIGH SENSITIVITY)
Troponin I (High Sensitivity): 46 ng/L — ABNORMAL HIGH (ref <18)
Troponin I (High Sensitivity): 50 ng/L — ABNORMAL HIGH (ref <18)

## 2019-11-18 MED ORDER — SODIUM CHLORIDE 0.9% FLUSH: 3.0000 mL | Freq: Once | INTRAVENOUS | Status: DC

## 2019-11-18 MED ORDER — METOPROLOL TARTRATE 5 MG/5ML IV SOLN: 2.5000 mg | Freq: Four times a day (QID) | INTRAVENOUS | Status: DC | PRN

## 2019-11-18 MED ORDER — SODIUM CHLORIDE 0.9 % IV SOLN
2.0000 g | INTRAVENOUS | Status: DC
  Administered 2019-11-18 – 2019-11-19 (×2): 2 g via INTRAVENOUS
  Filled 2019-11-18 (×2): qty 20
  Filled 2019-11-18: qty 2

## 2019-11-18 MED ORDER — APIXABAN 2.5 MG PO TABS
2.5000 mg | ORAL_TABLET | Freq: Two times a day (BID) | ORAL | Status: DC
  Administered 2019-11-18 – 2019-11-21 (×6): 2.5 mg via ORAL
  Filled 2019-11-18 (×7): qty 1

## 2019-11-18 MED ORDER — SODIUM BICARBONATE 650 MG PO TABS
650.0000 mg | ORAL_TABLET | Freq: Three times a day (TID) | ORAL | Status: DC
  Administered 2019-11-18 – 2019-11-21 (×8): 650 mg via ORAL
  Filled 2019-11-18 (×10): qty 1

## 2019-11-18 MED ORDER — SODIUM CHLORIDE (HYPERTONIC) 5 % OP SOLN
1.0000 [drp] | Freq: Four times a day (QID) | OPHTHALMIC | Status: DC
  Administered 2019-11-19 – 2019-11-21 (×9): 1 [drp] via OPHTHALMIC
  Filled 2019-11-18: qty 15

## 2019-11-18 MED ORDER — FUROSEMIDE 10 MG/ML IJ SOLN
40.0000 mg | Freq: Once | INTRAMUSCULAR | Status: AC
  Administered 2019-11-18: 40 mg via INTRAVENOUS
  Filled 2019-11-18: qty 4

## 2019-11-18 MED ORDER — ONDANSETRON HCL 4 MG/2ML IJ SOLN: 4.0000 mg | Freq: Once | INTRAMUSCULAR | Status: DC

## 2019-11-18 MED ORDER — SALINE SPRAY 0.65 % NA SOLN
1.0000 | NASAL | Status: DC | PRN
  Filled 2019-11-18: qty 44

## 2019-11-18 MED ORDER — INSULIN ASPART 100 UNIT/ML ~~LOC~~ SOLN
0.0000 [IU] | Freq: Three times a day (TID) | SUBCUTANEOUS | Status: DC
  Administered 2019-11-19 – 2019-11-20 (×5): 1 [IU] via SUBCUTANEOUS

## 2019-11-18 MED ORDER — SODIUM CHLORIDE 0.9 % IV SOLN: 250.0000 mL | INTRAVENOUS | Status: DC | PRN

## 2019-11-18 MED ORDER — ISOSORBIDE MONONITRATE ER 30 MG PO TB24
30.0000 mg | ORAL_TABLET | Freq: Every day | ORAL | Status: DC
  Administered 2019-11-19 – 2019-11-21 (×3): 30 mg via ORAL
  Filled 2019-11-18 (×3): qty 1

## 2019-11-18 MED ORDER — HYDRALAZINE HCL 25 MG PO TABS
25.0000 mg | ORAL_TABLET | Freq: Three times a day (TID) | ORAL | Status: DC
  Administered 2019-11-18 – 2019-11-21 (×9): 25 mg via ORAL
  Filled 2019-11-18 (×9): qty 1

## 2019-11-18 MED ORDER — POLYETHYLENE GLYCOL 3350 17 G PO PACK
17.0000 g | PACK | Freq: Every day | ORAL | Status: DC
  Filled 2019-11-18 (×2): qty 1

## 2019-11-18 MED ORDER — SODIUM CHLORIDE 0.9% FLUSH
3.0000 mL | Freq: Two times a day (BID) | INTRAVENOUS | Status: DC
  Administered 2019-11-18 – 2019-11-21 (×6): 3 mL via INTRAVENOUS

## 2019-11-18 MED ORDER — SODIUM CHLORIDE 0.9% FLUSH: 3.0000 mL | INTRAVENOUS | Status: DC | PRN

## 2019-11-18 MED ORDER — ONDANSETRON HCL 4 MG/2ML IJ SOLN: 4.0000 mg | Freq: Four times a day (QID) | INTRAMUSCULAR | Status: DC | PRN

## 2019-11-18 MED ORDER — LUTEIN 6 MG PO CAPS: 25.0000 mg | ORAL_CAPSULE | Freq: Every day | ORAL | Status: DC

## 2019-11-18 MED ORDER — ACETAMINOPHEN 500 MG PO TABS
1000.0000 mg | ORAL_TABLET | Freq: Four times a day (QID) | ORAL | Status: DC | PRN
  Administered 2019-11-19 – 2019-11-21 (×5): 1000 mg via ORAL
  Filled 2019-11-18 (×5): qty 2

## 2019-11-18 MED ORDER — PREGABALIN 25 MG PO CAPS
50.0000 mg | ORAL_CAPSULE | Freq: Every day | ORAL | Status: DC
  Administered 2019-11-19 – 2019-11-20 (×2): 50 mg via ORAL
  Filled 2019-11-18 (×3): qty 2

## 2019-11-18 MED ORDER — RESVERATROL 250 MG PO CAPS: 250.0000 mg | ORAL_CAPSULE | Freq: Every day | ORAL | Status: DC

## 2019-11-18 MED ORDER — LEVOFLOXACIN IN D5W 500 MG/100ML IV SOLN: 500.0000 mg | Freq: Once | INTRAVENOUS | Status: DC

## 2019-11-18 MED ORDER — FERROUS SULFATE 325 (65 FE) MG PO TABS
325.0000 mg | ORAL_TABLET | Freq: Two times a day (BID) | ORAL | Status: DC
  Administered 2019-11-18 – 2019-11-21 (×6): 325 mg via ORAL
  Filled 2019-11-18 (×6): qty 1

## 2019-11-18 MED ORDER — LORAZEPAM 0.5 MG PO TABS
0.5000 mg | ORAL_TABLET | Freq: Every day | ORAL | Status: DC
  Administered 2019-11-18 – 2019-11-20 (×3): 0.5 mg via ORAL
  Filled 2019-11-18 (×3): qty 1

## 2019-11-18 MED ORDER — CARVEDILOL 6.25 MG PO TABS
6.2500 mg | ORAL_TABLET | Freq: Two times a day (BID) | ORAL | Status: DC
  Administered 2019-11-19 – 2019-11-21 (×5): 6.25 mg via ORAL
  Filled 2019-11-18 (×5): qty 1

## 2019-11-18 MED ORDER — BUMETANIDE 0.25 MG/ML IJ SOLN
0.5000 mg | Freq: Two times a day (BID) | INTRAMUSCULAR | Status: DC
  Administered 2019-11-18 – 2019-11-21 (×6): 0.5 mg via INTRAVENOUS
  Filled 2019-11-18 (×9): qty 2

## 2019-11-18 NOTE — ED Triage Notes (Signed)
EMS states that a nurse practitioner was at pt's house to evaluated her. Called EMS due to her retaining fluid in abd, tachypnic. O2 85% on room air. 96% on 4L [place]. Pt only wears O2 at night- 3L. Pt denies pain. Pt feels SOB. EKG Afib- with hx. Hr 60-80. BP 150/100. Pt is alert and at mental baseline. Pt has had a cough for a few weeks.

## 2019-11-18 NOTE — ED Provider Notes (Signed)
[facility] EMERGENCY DEPARTMENT Provider Note   CSN: [id number] Arrival date & time: 11/18/19  1230     History No chief complaint on file.   [name] is a 86 y.o. female.  HPI    Presents with her daughter who assists with the HPI. Patient has multiple medical issues including CHF. Patient takes Lasix daily, and alternating doses of 20/40. Now, over the past 2 or 3 days the patient has had increased work of breathing, shortness of breath, and weight gain.  She notes that she has gained about 3 pounds over this time.  Patient does not wear oxygen during the day, but was found to be hypoxic, per EMS on arrival, 85% on room air. With supplemental oxygen improved to 96%. Patient self denies pain, denies fever, denies nausea, vomiting.  Past Medical History:  Diagnosis Date  .  Rib fractures - right #8-10 05/09/2015  . Acute and chronic respiratory failure (acute-on-chronic) ([place]) 12/23/2017  . Acute on chronic congestive heart failure ([place])   . AKI (acute kidney injury) ([place]) 05/10/2015  . Atrial fibrillation ([place]) 05/09/2015  . Cancer [facility])    breast cancer  . Cardiomyopathy ([place])    a. 04/2015 Echo: EF 55-60%;  b. 07/2015 Echo: EF 35-40%, mod LVH, antsep DK, mod AI, sev MR, sev dil LA, mildly dil RA, mild-mod TR, PASP 16mmHg-->conservatively managed.  . CHF exacerbation ([place]) 08/02/2015  . Chronic systolic CHF (congestive heart failure) ([place])    a. 07/2015 Echo: EF 35-40%.  . CKD (chronic kidney disease), stage IV ([place])   . CKD (chronic kidney disease), stage V ([place]) 12/23/2017  . Closed left hip fracture, initial encounter ([place]) 11/20/2018  . Congestive dilated cardiomyopathy ([place]) 08/24/2015  . Congestive heart failure (CHF) ([place]) 08/24/2015  . Diabetes mellitus without complication ([place])   . Dyspnea 08/02/2015  . Elevated lactic acid level 08/02/2015  . Essential hypertension   . Fall 05/09/2015  . History of breast cancer 05/09/2015  . HLD  (hyperlipidemia)   . Mitral regurgitation 08/24/2015  . PAF (paroxysmal atrial fibrillation) ([place])    a. diagnosed 04/2015 --> conservative mgmt with rate control and xarelto.  [name] Permanent atrial fibrillation ([place])    a. diagnosed 04/2015 --> conservative mgmt with rate control and xarelto.  [name] Postoperative anemia due to acute blood loss 11/23/2018  . Pulsatile neck mass    a. 11/2015 Neck U/S: pulsatile R neck mass correlates w/ a toruous and mildy ectatic segment of the R SCA measuring ~ 2.1 cm in max diameter - no change since noted on 12/16 CT.  [name] Scapula fracture 05/09/2015  . Severe mitral regurgitation by prior echocardiogram    a. 07/2015 sev MR    Patient Active Problem List   Diagnosis Date Noted  . Postoperative anemia due to acute blood loss 11/23/2018  . Closed left hip fracture, initial encounter ([place]) 11/20/2018  . Acute and chronic respiratory failure (acute-on-chronic) ([place]) 12/23/2017  . CKD (chronic kidney disease), stage V ([place]) 12/23/2017  . Essential hypertension   . HLD (hyperlipidemia)   . PAF (paroxysmal atrial fibrillation) ([place])   . Cardiomyopathy ([place])   . Mitral regurgitation 08/24/2015  . Congestive dilated cardiomyopathy ([place]) 08/24/2015  . Congestive heart failure (CHF) ([place]) 08/24/2015  . Acute on chronic congestive heart failure ([place])   . Dyspnea 08/02/2015  . AKI (acute kidney injury) ([place]) 05/10/2015  .  Rib fractures - right #8-10 05/09/2015  . Atrial fibrillation ([place]) 05/09/2015  . Scapula  fracture 05/09/2015  . Fall 05/09/2015  . Essential hypertension, benign 05/09/2015  . Diabetes mellitus without complication ([place]) 59/56/3875  . History of breast cancer 05/09/2015    Past Surgical History:  Procedure Laterality Date  . ABDOMINAL HYSTERECTOMY    . BREAST LUMPECTOMY    . FRACTURE SURGERY     L femur  . JOINT REPLACEMENT     knees  . ORIF HIP FRACTURE Left 11/21/2018   Procedure: OPEN REDUCTION INTERNAL FIXATION  OF THE LEFT HIP;   Surgeon: Shona Needles, MD;  Location: [facility];  Service: Orthopedics;  Laterality: Left;     OB History   No obstetric history on file.     Family History  Problem Relation Age of Onset  . Coronary artery disease Father   . Stroke Mother     Social History   Tobacco Use  . Smoking status: Never Smoker  . Smokeless tobacco: Never Used  Vaping Use  . Vaping Use: Never used  Substance Use Topics  . Alcohol use: No    Alcohol/week: 0.0 standard drinks  . Drug use: Not Currently    Home Medications Prior to Admission medications   Medication Sig Start Date End Date Taking? Authorizing Provider  acetaminophen (TYLENOL) 500 MG tablet Take 1,000 mg by mouth every 6 (six) hours as needed for headache (pain).    Yes [provider]  apixaban (ELIQUIS) 2.5 MG TABS tablet Take 1 tablet (2.5 mg total) by mouth 2 (two) times daily. 08/27/18  Yes Lelon Perla, MD  carvedilol (COREG) 6.25 MG tablet Take 1 tablet (6.25 mg total) by mouth 2 (two) times daily with a meal. 09/22/19  Yes Crenshaw, Denice Bors, MD  diltiazem (CARDIZEM CD) 120 MG 24 hr capsule Take 1 capsule (120 mg total) by mouth daily. 10/10/19  Yes Lelon Perla, MD  ferrous sulfate 325 (65 FE) MG tablet Take 325 mg by mouth 2 (two) times daily with a meal.    Yes [provider]  hydrALAZINE (APRESOLINE) 10 MG tablet Take 1 tablet (10 mg total) by mouth 3 (three) times daily. 06/17/19  Yes Lelon Perla, MD  isosorbide mononitrate (IMDUR) 30 MG 24 hr tablet Take 1 tablet (30 mg total) by mouth daily. 06/17/19  Yes Lelon Perla, MD  LORazepam (ATIVAN) 0.5 MG tablet Take 0.5 mg by mouth at bedtime.  01/01/18  Yes [provider]  LUTEIN PO Take 25 mg by mouth daily.    Yes [provider]  OXYGEN Inhale 3 L into the lungs See admin instructions. Use every night at bedtime and during the day if needed for shortness of breath   Yes [provider]  polyethylene glycol (MIRALAX /  GLYCOLAX) packet Take 17 g by mouth daily.  05/13/15  Yes [provider]  pregabalin (LYRICA) 50 MG capsule Take 50 mg by mouth daily.   Yes [provider]  Resveratrol 250 MG CAPS Take 250 mg by mouth daily.   Yes [provider]  saxagliptin HCl (ONGLYZA) 2.5 MG TABS tablet Take 2.5 mg by mouth daily.  06/05/16  Yes [provider]  sodium chloride (MURO 128) 5 % ophthalmic solution Place 1 drop into the left eye 4 (four) times daily.   Yes [provider]  sodium chloride (OCEAN) 0.65 % SOLN nasal spray Place 1 spray into both nostrils as needed for congestion.   Yes [provider]  torsemide (DEMADEX) 20 MG tablet Take 2  tablets (40 mg) alternating with 20 mg every other day. Patient taking differently: Take 20-40 mg by mouth See admin instructions. Take 2 tablets (40 mg) alternating with 20 mg every other day. 02/06/19  Yes Lelon Perla, MD  diclofenac Sodium (VOLTAREN) 1 % GEL Apply 2 g topically 4 (four) times daily. Patient not taking: Reported on 11/18/2019 08/21/19   Aundra Dubin, PA-C  HYDROcodone-acetaminophen (NORCO/VICODIN) 5-325 MG tablet Take 1 tablet by mouth every 12 (twelve) hours as needed for moderate pain. Patient not taking: Reported on 11/18/2019 10/24/19 10/23/20  Magnant, Gerrianne Scale, PA-C  methylPREDNISolone (MEDROL) 4 MG tablet TAKE AS DIRECTED SEE ATTACHED SHEET Patient not taking: Reported on 11/18/2019 08/21/19   [provider]  Vitamin D, Ergocalciferol, (DRISDOL) 1.25 MG (50000 UNIT) CAPS capsule Take 1 capsule (50,000 Units total) by mouth every 7 (seven) days. Patient not taking: Reported on 11/18/2019 11/05/19   Magnant, Gerrianne Scale, PA-C    Allergies    Codeine, Neurontin [gabapentin], and Oxycodone  Review of Systems   Review of Systems  Constitutional:       Per HPI, otherwise negative  HENT:       Per HPI, otherwise negative  Respiratory:       Per HPI, otherwise negative  Cardiovascular:         Per HPI, otherwise negative  Gastrointestinal: Negative for vomiting.  Endocrine:       Negative aside from HPI  Genitourinary:       Neg aside from HPI   Musculoskeletal:       Per HPI, otherwise negative  Skin: Negative.   Neurological: Positive for weakness. Negative for syncope.    Physical Exam Updated Vital Signs BP (!) 157/133   Pulse 84   Temp 97.8 F (36.6 C) (Oral)   Resp (!) 32   Ht 5\' 2"  (1.575 m)   Wt 61.7 kg   SpO2 96%   BMI 24.87 kg/m   Physical Exam Vitals and nursing note reviewed.  Constitutional:      Appearance: She is well-developed. She is ill-appearing and diaphoretic.  HENT:     Head: Normocephalic and atraumatic.  Eyes:     Conjunctiva/sclera: Conjunctivae normal.  Cardiovascular:     Rate and Rhythm: Regular rhythm. Tachycardia present.  Pulmonary:     Effort: Tachypnea and accessory muscle usage present.     Breath sounds: Decreased air movement present. Decreased breath sounds present.  Abdominal:     General: There is no distension.  Skin:    General: Skin is warm.       Neurological:     Mental Status: She is alert and oriented to person, place, and time.     Cranial Nerves: No cranial nerve deficit.      ED Results / Procedures / Treatments   Labs (all labs ordered are listed, but only abnormal results are displayed) Labs Reviewed  BASIC METABOLIC PANEL - Abnormal; Notable for the following components:      Result Value   CO2 15 (*)    Glucose, Bld 176 (*)    BUN 109 (*)    Creatinine, Ser 3.27 (*)    GFR calc non Af Amer 12 (*)    GFR calc Af Amer 14 (*)    Anion gap 16 (*)    All other components within normal limits  CBC - Abnormal; Notable for the following components:   RBC 3.78 (*)    Hemoglobin 11.5 (*)  HCT 34.9 (*)    All other components within normal limits  TROPONIN I (HIGH SENSITIVITY) - Abnormal; Notable for the following components:   Troponin I (High Sensitivity) 46 (*)    All other  components within normal limits  BRAIN NATRIURETIC PEPTIDE  TROPONIN I (HIGH SENSITIVITY)    EKG EKG Interpretation  Date/Time:  Tuesday November 18 2019 12:38:45 EDT Ventricular Rate:  83 PR Interval:    QRS Duration: 163 QT Interval:  402 QTC Calculation: 473 R Axis:   -44 Text Interpretation: Atrial fibrillation Multiple ventricular premature complexes Left bundle branch block Abnormal ECG Confirmed by Carmin Muskrat [phone]) on 11/18/2019 1:03:51 PM   Radiology DG Chest 2 View  Result Date: 11/18/2019 CLINICAL DATA:  86 year old female with history of shortness of breath. EXAM: CHEST - 2 VIEW COMPARISON:  Chest x-ray 11/20/2018. FINDINGS: Lung volumes are low. Opacity at the left base which may reflect atelectasis and/or consolidation. Right lung is clear. No definite pleural effusions. Cephalization of the pulmonary vasculature, without frank pulmonary edema. No pneumothorax. Moderate cardiomegaly. Upper mediastinal contours are distorted by patient's position. Aortic atherosclerosis. Multiple old healed left-sided rib fractures. Advanced degenerative changes in the glenohumeral joints bilaterally (left greater than right). IMPRESSION: 1. Low lung volumes with atelectasis and/or consolidation in the left lower lobe. 2. Moderate cardiomegaly with pulmonary venous congestion, but no frank pulmonary edema. 3. Aortic atherosclerosis. Electronically Signed   By: Vinnie Langton M.D.   On: 11/18/2019 13:47    Procedures Procedures (including critical care time)  Medications Ordered in ED Medications  sodium chloride flush (NS) 0.9 % injection 3 mL (3 mLs Intravenous Not Given 11/18/19 1243)    ED Course  I have reviewed the triage vital signs and the nursing notes.  Pertinent labs & imaging results that were available during my care of the patient were reviewed by me and considered in my medical decision making (see chart for details).    MDM Rules/Calculators/A&P                           3:09 PM Patient in similar condition. Initial findings notable for worsening renal function with a creatinine greater than 3, substantially from baseline, BUN 109. In addition, patient found to have elevated troponin value, though she continues to deny any chest pain. EKG, nonischemic.  X-ray concerning for heart failure exacerbation, consistent with presentation, labs. She has received Lasix, IV given her evidence for heart failure exacerbation, new oxygen requirement.  Echocardiogram from 2017 as below: Study Conclusions   - Left ventricle: Wall thickness was increased in a pattern of    moderate LVH. There was focal basal hypertrophy. Systolic    function was moderately reduced. The estimated ejection fraction    was in the range of 35% to 40%. Dyskinesis of the anteroseptal    myocardium.  - Aortic valve: There was moderate regurgitation.  - Mitral valve: There was severe regurgitation.  - Left atrium: The atrium was severely dilated.  - Right atrium: The atrium was mildly dilated.  - Tricuspid valve: There was mild-moderate regurgitation.  - Pulmonary arteries: Systolic pressure was mildly increased. PA    peak pressure: 33 mm Hg (S).    Update: Patient in improved condition. I discussed the findings with the patient's granddaughter who is her caregiver. With evidence for both worsening heart failure, and possible pneumonia, the patient has received IV Lasix, will receive antibiotics. With new oxygen requirement she will  require admission for further monitoring, management.  Final Clinical Impression(s) / ED Diagnoses Final diagnoses:  Acute on chronic systolic congestive heart failure ([place])   MDM Number of Diagnoses or Management Options Acute on chronic systolic congestive heart failure ([place]): new, needed workup   Amount and/or Complexity of Data Reviewed Clinical lab tests: reviewed Tests in the radiology section of CPT: reviewed Tests in the medicine section  of CPT: reviewed Decide to obtain previous medical records or to obtain history from someone other than the patient: yes Obtain history from someone other than the patient: yes Review and summarize past medical records: yes Discuss the patient with other providers: yes Independent visualization of images, tracings, or specimens: yes  Risk of Complications, Morbidity, and/or Mortality Presenting problems: high Diagnostic procedures: high Management options: high  Critical Care Total time providing critical care: < 30 minutes  Patient Progress Patient progress: improved    Carmin Muskrat, MD 11/18/19 1653

## 2019-11-18 NOTE — H&P (Addendum)
History and Physical    [name] FGH:[id number] DOB: 01-03-1926 DOA: 11/18/2019  PCP: Bernerd Limbo, MD (Confirm with patient/family/NH records and if not entered, this has to be entered at [facility] point of entry) Patient coming from: Home  I have personally briefly reviewed patient's old medical records in [facility]  Chief Complaint: SOB  HPI: [name] is a 86 y.o. female with medical history significant of systolic CHF, hypertension, CKD stage IV, IIDM, Chronic a-fib on Eliquis, resented with increasing short of breath and weight gaining.  Patient been feeling short of breath started about 3 days ago, increasingly getting worse.  Patient had a recent stress fracture on right ankle was treated conservatively with orthopedic surgery, and she has not been as active as before the fracture.  She says mostly the pain is controlled, she denies any chest pain no cough no fever or chills. ED Course: Chest x-ray showed cardiomegaly and pulmonary congestion, blood work showed elevation of creatinine level from 2.7-3.2 today BUN 109.  Review of Systems: As per HPI otherwise 10 point review of systems negative.    Past Medical History:  Diagnosis Date  .  Rib fractures - right #8-10 05/09/2015  . Acute and chronic respiratory failure (acute-on-chronic) ([place]) 12/23/2017  . Acute on chronic congestive heart failure ([place])   . AKI (acute kidney injury) ([place]) 05/10/2015  . Atrial fibrillation ([place]) 05/09/2015  . Cancer [facility])    breast cancer  . Cardiomyopathy ([place])    a. 04/2015 Echo: EF 55-60%;  b. 07/2015 Echo: EF 35-40%, mod LVH, antsep DK, mod AI, sev MR, sev dil LA, mildly dil RA, mild-mod TR, PASP 62mmHg-->conservatively managed.  . CHF exacerbation ([place]) 08/02/2015  . Chronic systolic CHF (congestive heart failure) ([place])    a. 07/2015 Echo: EF 35-40%.  . CKD (chronic kidney disease), stage IV ([place])   . CKD (chronic kidney disease), stage V ([place]) 12/23/2017  . Closed left hip fracture,  initial encounter ([place]) 11/20/2018  . Congestive dilated cardiomyopathy ([place]) 08/24/2015  . Congestive heart failure (CHF) ([place]) 08/24/2015  . Diabetes mellitus without complication ([place])   . Dyspnea 08/02/2015  . Elevated lactic acid level 08/02/2015  . Essential hypertension   . Fall 05/09/2015  . History of breast cancer 05/09/2015  . HLD (hyperlipidemia)   . Mitral regurgitation 08/24/2015  . PAF (paroxysmal atrial fibrillation) ([place])    a. diagnosed 04/2015 --> conservative mgmt with rate control and xarelto.  [name] Permanent atrial fibrillation ([place])    a. diagnosed 04/2015 --> conservative mgmt with rate control and xarelto.  [name] Postoperative anemia due to acute blood loss 11/23/2018  . Pulsatile neck mass    a. 11/2015 Neck U/S: pulsatile R neck mass correlates w/ a toruous and mildy ectatic segment of the R SCA measuring ~ 2.1 cm in max diameter - no change since noted on 12/16 CT.  [name] Scapula fracture 05/09/2015  . Severe mitral regurgitation by prior echocardiogram    a. 07/2015 sev MR    Past Surgical History:  Procedure Laterality Date  . ABDOMINAL HYSTERECTOMY    . BREAST LUMPECTOMY    . FRACTURE SURGERY     L femur  . JOINT REPLACEMENT     knees  . ORIF HIP FRACTURE Left 11/21/2018   Procedure: OPEN REDUCTION INTERNAL FIXATION  OF THE LEFT HIP;  Surgeon: Shona Needles, MD;  Location: [place];  Service: Orthopedics;  Laterality: Left;     reports that she has never smoked.  She has never used smokeless tobacco. She reports previous drug use. She reports that she does not drink alcohol.  Allergies  Allergen Reactions  . Codeine Other (See Comments)    "talks out of her head"   . Neurontin [Gabapentin] Swelling    Leg swelling  . Oxycodone Other (See Comments)    confusion     Family History  Problem Relation Age of Onset  . Coronary artery disease Father   . Stroke Mother      Prior to Admission medications   Medication Sig Start Date End Date Taking? Authorizing  Provider  acetaminophen (TYLENOL) 500 MG tablet Take 1,000 mg by mouth every 6 (six) hours as needed for headache (pain).    Yes [provider]  apixaban (ELIQUIS) 2.5 MG TABS tablet Take 1 tablet (2.5 mg total) by mouth 2 (two) times daily. 08/27/18  Yes Lelon Perla, MD  carvedilol (COREG) 6.25 MG tablet Take 1 tablet (6.25 mg total) by mouth 2 (two) times daily with a meal. 09/22/19  Yes Crenshaw, Denice Bors, MD  diltiazem (CARDIZEM CD) 120 MG 24 hr capsule Take 1 capsule (120 mg total) by mouth daily. 10/10/19  Yes Lelon Perla, MD  ferrous sulfate 325 (65 FE) MG tablet Take 325 mg by mouth 2 (two) times daily with a meal.    Yes [provider]  hydrALAZINE (APRESOLINE) 10 MG tablet Take 1 tablet (10 mg total) by mouth 3 (three) times daily. 06/17/19  Yes Lelon Perla, MD  isosorbide mononitrate (IMDUR) 30 MG 24 hr tablet Take 1 tablet (30 mg total) by mouth daily. 06/17/19  Yes Lelon Perla, MD  LORazepam (ATIVAN) 0.5 MG tablet Take 0.5 mg by mouth at bedtime.  01/01/18  Yes [provider]  LUTEIN PO Take 25 mg by mouth daily.    Yes [provider]  OXYGEN Inhale 3 L into the lungs See admin instructions. Use every night at bedtime and during the day if needed for shortness of breath   Yes [provider]  polyethylene glycol (MIRALAX / GLYCOLAX) packet Take 17 g by mouth daily.  05/13/15  Yes [provider]  pregabalin (LYRICA) 50 MG capsule Take 50 mg by mouth daily.   Yes [provider]  Resveratrol 250 MG CAPS Take 250 mg by mouth daily.   Yes [provider]  saxagliptin HCl (ONGLYZA) 2.5 MG TABS tablet Take 2.5 mg by mouth daily.  06/05/16  Yes [provider]  sodium chloride (MURO 128) 5 % ophthalmic solution Place 1 drop into the left eye 4 (four) times daily.   Yes [provider]  sodium chloride (OCEAN) 0.65 % SOLN nasal spray Place 1 spray into both nostrils as needed for  congestion.   Yes [provider]  torsemide (DEMADEX) 20 MG tablet Take 2 tablets (40 mg) alternating with 20 mg every other day. Patient taking differently: Take 20-40 mg by mouth See admin instructions. Take 2 tablets (40 mg) alternating with 20 mg every other day. 02/06/19  Yes Lelon Perla, MD  diclofenac Sodium (VOLTAREN) 1 % GEL Apply 2 g topically 4 (four) times daily. Patient not taking: Reported on 11/18/2019 08/21/19   Aundra Dubin, PA-C  HYDROcodone-acetaminophen (NORCO/VICODIN) 5-325 MG tablet Take 1 tablet by mouth every 12 (twelve) hours as needed for moderate pain. Patient not taking: Reported on 11/18/2019 10/24/19 10/23/20  Magnant, Gerrianne Scale, PA-C  methylPREDNISolone (MEDROL) 4 MG tablet TAKE AS DIRECTED  SEE ATTACHED SHEET Patient not taking: Reported on 11/18/2019 08/21/19   [provider]  Vitamin D, Ergocalciferol, (DRISDOL) 1.25 MG (50000 UNIT) CAPS capsule Take 1 capsule (50,000 Units total) by mouth every 7 (seven) days. Patient not taking: Reported on 11/18/2019 11/05/19   Donella Stade, PA-C    Physical Exam: Vitals:   11/18/19 1234 11/18/19 1239 11/18/19 1243 11/18/19 1300  BP:  (!) 155/95  (!) 157/133  Pulse:    84  Resp:  (!) 21  (!) 32  Temp:  97.8 F (36.6 C)    TempSrc:  Oral    SpO2:  97%  96%  Weight:   61.7 kg   Height: 5\' 2"  (1.575 m)       Constitutional: NAD, calm, comfortable Vitals:   11/18/19 1234 11/18/19 1239 11/18/19 1243 11/18/19 1300  BP:  (!) 155/95  (!) 157/133  Pulse:    84  Resp:  (!) 21  (!) 32  Temp:  97.8 F (36.6 C)    TempSrc:  Oral    SpO2:  97%  96%  Weight:   61.7 kg   Height: 5\' 2"  (1.575 m)      Eyes: PERRL, lids and conjunctivae normal ENMT: Mucous membranes are moist. Posterior pharynx clear of any exudate or lesions.Normal dentition.  Neck: normal, supple, no masses, no thyromegaly Respiratory: Bilateral crackles. Normal respiratory effort. No accessory muscle use.  Cardiovascular:  Irregular rate and rhythm, no murmurs / rubs / gallops. No extremity edema. 2+ pedal pulses. No carotid bruits.  Abdomen: no tenderness, no masses palpated. No hepatosplenomegaly. Bowel sounds positive.  Musculoskeletal: no clubbing / cyanosis. No joint deformity upper and lower extremities. Good ROM, no contractures. Normal muscle tone.  Right ankle tenderness Skin: no rashes, lesions, ulcers. No induration Neurologic: CN 2-12 grossly intact. Sensation intact, DTR normal. Strength 5/5 in all 4.  Psychiatric: Normal judgment and insight. Alert and oriented x 3. Normal mood.     Labs on Admission: I have personally reviewed following labs and imaging studies  CBC: Recent Labs  Lab 11/18/19 1245  WBC 9.8  HGB 11.5*  HCT 34.9*  MCV 92.3  PLT 161   Basic Metabolic Panel: Recent Labs  Lab 11/18/19 1245  NA 142  K 4.5  CL 111  CO2 15*  GLUCOSE 176*  BUN 109*  CREATININE 3.27*  CALCIUM 9.8   GFR: Estimated Creatinine Clearance: 9.7 mL/min (A) (by C-G formula based on SCr of 3.27 mg/dL (H)). Liver Function Tests: No results for input(s): AST, ALT, ALKPHOS, BILITOT, PROT, ALBUMIN in the last 168 hours. No results for input(s): LIPASE, AMYLASE in the last 168 hours. No results for input(s): AMMONIA in the last 168 hours. Coagulation Profile: No results for input(s): INR, PROTIME in the last 168 hours. Cardiac Enzymes: No results for input(s): CKTOTAL, CKMB, CKMBINDEX, TROPONINI in the last 168 hours. BNP (last 3 results) No results for input(s): PROBNP in the last 8760 hours. HbA1C: No results for input(s): HGBA1C in the last 72 hours. CBG: No results for input(s): GLUCAP in the last 168 hours. Lipid Profile: No results for input(s): CHOL, HDL, LDLCALC, TRIG, CHOLHDL, LDLDIRECT in the last 72 hours. Thyroid Function Tests: No results for input(s): TSH, T4TOTAL, FREET4, T3FREE, THYROIDAB in the last 72 hours. Anemia Panel: No results for input(s): VITAMINB12, FOLATE,  FERRITIN, TIBC, IRON, RETICCTPCT in the last 72 hours. Urine analysis:    Component Value Date/Time   COLORURINE YELLOW 12/01/2018 2321   APPEARANCEUR  CLEAR 12/01/2018 2321   LABSPEC 1.012 12/01/2018 2321   PHURINE 5.0 12/01/2018 2321   GLUCOSEU NEGATIVE 12/01/2018 2321   HGBUR NEGATIVE 12/01/2018 2321   BILIRUBINUR NEGATIVE 12/01/2018 2321   KETONESUR NEGATIVE 12/01/2018 2321   PROTEINUR NEGATIVE 12/01/2018 2321   NITRITE NEGATIVE 12/01/2018 2321   LEUKOCYTESUR NEGATIVE 12/01/2018 2321    Radiological Exams on Admission: DG Chest 2 View  Result Date: 11/18/2019 CLINICAL DATA:  86 year old female with history of shortness of breath. EXAM: CHEST - 2 VIEW COMPARISON:  Chest x-ray 11/20/2018. FINDINGS: Lung volumes are low. Opacity at the left base which may reflect atelectasis and/or consolidation. Right lung is clear. No definite pleural effusions. Cephalization of the pulmonary vasculature, without frank pulmonary edema. No pneumothorax. Moderate cardiomegaly. Upper mediastinal contours are distorted by patient's position. Aortic atherosclerosis. Multiple old healed left-sided rib fractures. Advanced degenerative changes in the glenohumeral joints bilaterally (left greater than right). IMPRESSION: 1. Low lung volumes with atelectasis and/or consolidation in the left lower lobe. 2. Moderate cardiomegaly with pulmonary venous congestion, but no frank pulmonary edema. 3. Aortic atherosclerosis. Electronically Signed   By: Vinnie Langton M.D.   On: 11/18/2019 13:47    EKG: Independently reviewed.  Chronic A. fib and chronic LBBB  Assessment/Plan Active Problems:   CHF (congestive heart failure) ([place])  (please populate well all problems here in Problem List. (For example, if patient is on BP meds at home and you resume or decide to hold them, it is a problem that needs to be her. Same for CAD, COPD, HLD and so on)  Acute on chronic systolic CHF decompensation -Fluid overload, received 40  mg IV Lasix in the ED -Switched to Bumex for mild diuresis given patient age -Stop CCB for CHF reason, may use metoprolol for rate control. -Consider cardio consult for follow-up as outpatient for fine-tuning her CHF medications -Repeat chest x-ray and electrolytes by consults tomorrow morning -Check echo  AKI on CKD stage IV -Has signs of fluid overload, suspect cardiorenal syndrome, diuresis as above and monitor volume status and input and output -Discussed patient and her daughter at bedside, patient is DNR and declined HD in the past, so as of now will hold off consult Nephro -Echo to making sure no uremic pericarditis.  HTN uncontrolled -Resume home BP meds, Bumex as above  IIDM -Hold DPP-4 for CHF. -Sliding scale  Chronic A. Fib -Continue Eliquis  DVT prophylaxis: On Eliquis Code Status: DNR  family Communication: Daughter at bedside Disposition Plan: Patient with multiple medical conditions came with CHF and AKI, likely will need more than 2 midnight hospital stay to optimize his CHF meds and stabilize his kidney function. Consults called: None Admission status: Tele admit   Lequita Halt MD [facility] Pager [phone]    11/18/2019, 5:35 PM

## 2019-11-19 ENCOUNTER — Inpatient Hospital Stay (HOSPITAL_COMMUNITY): Payer: Medicare Other

## 2019-11-19 DIAGNOSIS — E118 Type 2 diabetes mellitus with unspecified complications: Secondary | ICD-10-CM

## 2019-11-19 DIAGNOSIS — N179 Acute kidney failure, unspecified: Secondary | ICD-10-CM

## 2019-11-19 DIAGNOSIS — I5021 Acute systolic (congestive) heart failure: Secondary | ICD-10-CM

## 2019-11-19 DIAGNOSIS — E872 Acidosis, unspecified: Secondary | ICD-10-CM

## 2019-11-19 DIAGNOSIS — I482 Chronic atrial fibrillation, unspecified: Secondary | ICD-10-CM

## 2019-11-19 DIAGNOSIS — N184 Chronic kidney disease, stage 4 (severe): Secondary | ICD-10-CM

## 2019-11-19 DIAGNOSIS — I5023 Acute on chronic systolic (congestive) heart failure: Secondary | ICD-10-CM

## 2019-11-19 LAB — BASIC METABOLIC PANEL
Anion gap: 15 (ref 5–15)
BUN: 107 mg/dL — ABNORMAL HIGH (ref 8–23)
CO2: 17 mmol/L — ABNORMAL LOW (ref 22–32)
Calcium: 9.7 mg/dL (ref 8.9–10.3)
Chloride: 108 mmol/L (ref 98–111)
Creatinine, Ser: 2.99 mg/dL — ABNORMAL HIGH (ref 0.44–1.00)
GFR calc Af Amer: 15 mL/min — ABNORMAL LOW (ref >60)
GFR calc non Af Amer: 13 mL/min — ABNORMAL LOW (ref >60)
Glucose, Bld: 154 mg/dL — ABNORMAL HIGH (ref 70–99)
Potassium: 3.7 mmol/L (ref 3.5–5.1)
Sodium: 140 mmol/L (ref 135–145)

## 2019-11-19 LAB — GLUCOSE, CAPILLARY
Glucose-Capillary: 135 mg/dL — ABNORMAL HIGH (ref 70–99)
Glucose-Capillary: 134 mg/dL — ABNORMAL HIGH (ref 70–99)
Glucose-Capillary: 126 mg/dL — ABNORMAL HIGH (ref 70–99)
Glucose-Capillary: 141 mg/dL — ABNORMAL HIGH (ref 70–99)

## 2019-11-19 LAB — HEMOGLOBIN A1C
Hgb A1c MFr Bld: 6.8 % — ABNORMAL HIGH (ref 4.8–5.6)
Mean Plasma Glucose: 148.46 mg/dL

## 2019-11-19 LAB — ECHOCARDIOGRAM COMPLETE
Height: 62 in
Weight: 1742.52 oz

## 2019-11-19 LAB — SARS CORONAVIRUS 2 BY RT PCR (HOSPITAL ORDER, PERFORMED IN ~~LOC~~ HOSPITAL LAB): SARS Coronavirus 2: NEGATIVE

## 2019-11-19 LAB — PHOSPHORUS: Phosphorus: 5.2 mg/dL — ABNORMAL HIGH (ref 2.5–4.6)

## 2019-11-19 LAB — MAGNESIUM: Magnesium: 2.6 mg/dL — ABNORMAL HIGH (ref 1.7–2.4)

## 2019-11-19 NOTE — Progress Notes (Signed)
PT Cancellation Note  Patient Details Name: [name] MRN: [id number] DOB: Feb 02, 1926   Cancelled Treatment:    Reason Eval/Treat Not Completed: Other (comment).  Has bedrest order and has R foot boot with pt report of fracture. Will follow up for further guidance.   Ramond Dial 11/19/2019, 9:45 AM   Mee Hives, PT MS Acute Rehab Dept. Number: [place] and [place]

## 2019-11-19 NOTE — Progress Notes (Signed)
Physical Therapy Evaluation Patient Details Name: [name] MRN: [id number] DOB: 1926/05/27 Today's Date: 11/19/2019   History of Present Illness  86 yo female with onset of SOB and weight gain at home was admitted, noting acute renal failure, acute CHF and AKI with acidosis from renal failure.  PMHx:  R ankle stress fracture without information about WB, CKD 4, HTN, a-fib, DM,   Clinical Impression  Pt was on side of bed and had not yet received guidance on WB on RLE but now is permitted [facility] with information from MD office note.  Pt will wear her R ankle boot and be PWB on RW, and will work toward better control of gait and balance to transition to home ASAP.  Follow acutely for these needss.    Follow Up Recommendations SNF;Supervision for mobility/OOB    Equipment Recommendations  Rolling walker with 5" wheels    Recommendations for Other Services       Precautions / Restrictions Precautions Precautions: Fall Precaution Comments: PWB on R foot Required Braces or Orthoses: Splint/Cast Splint/Cast: R ankle boot for WB Splint/Cast - Date Prophylactic Dressing Applied (if applicable): 26/71/24 Restrictions Weight Bearing Restrictions: Yes      Mobility  Bed Mobility Overal bed mobility: Needs Assistance Bed Mobility: Supine to Sit;Sit to Supine     Supine to sit: Mod assist Sit to supine: Mod assist   General bed mobility comments: mod assist to support trunk and avoid use of R ankle  Transfers Overall transfer level: Needs assistance Equipment used: 1 person hand held assist Transfers: Sit to/from Stand Sit to Stand: Min assist         General transfer comment: min assist to maintain NWB on R ankle pre-order to do only PWB on R anlke  Ambulation/Gait             General Gait Details: unable  Stairs            Wheelchair Mobility    Modified Rankin (Stroke Patients Only)       Balance Overall balance assessment: Needs  assistance Sitting-balance support: Feet supported Sitting balance-Leahy Scale: Fair                                       Pertinent Vitals/Pain Pain Assessment: No/denies pain    Home Living Family/patient expects to be discharged to:: Private residence Living Arrangements: Other relatives Available Help at Discharge: Family;Available 24 hours/day Type of Home: House Home Access: Ramped entrance     Home Layout: One level Home Equipment: Cane - single point;Walker - 4 wheels Additional Comments: Per MD after evaluation is PWB    Prior Function Level of Independence: Needs assistance   Gait / Transfers Assistance Needed: has been assisted to transfer on rollator  ADL's / Homemaking Assistance Needed: has family with her at home        Hand Dominance   Dominant Hand: Right    Extremity/Trunk Assessment   Upper Extremity Assessment Upper Extremity Assessment: Generalized weakness    Lower Extremity Assessment Lower Extremity Assessment: Generalized weakness;RLE deficits/detail RLE Deficits / Details: R ankle stress fracture RLE: Unable to fully assess due to immobilization RLE Coordination: decreased fine motor;decreased gross motor    Cervical / Trunk Assessment Cervical / Trunk Assessment: Kyphotic  Communication   Communication: No difficulties  Cognition Arousal/Alertness: Awake/alert Behavior During Therapy: Flat affect Overall Cognitive Status: Within Functional Limits  for tasks assessed                                 General Comments: not clear what cognition was prev but could answer history questions      General Comments General comments (skin integrity, edema, etc.): Pt was able to scoot on side of bed and make partial standing attempt, will be able to walk on boot with PWB    Exercises     Assessment/Plan    PT Assessment Patient needs continued PT services  PT Problem List Decreased strength;Decreased range  of motion;Decreased activity tolerance;Decreased balance;Decreased mobility;Decreased coordination;Decreased cognition;Decreased knowledge of use of DME;Decreased safety awareness;Decreased knowledge of precautions;Cardiopulmonary status limiting activity;Decreased skin integrity       PT Treatment Interventions DME instruction;Gait training;Functional mobility training;Therapeutic activities;Therapeutic exercise;Balance training;Neuromuscular re-education;Patient/family education    PT Goals (Current goals can be found in the Care Plan section)  Acute Rehab PT Goals Patient Stated Goal: to get home and feel stronger PT Goal Formulation: With patient Time For Goal Achievement: 12/03/19 Potential to Achieve Goals: Good    Frequency Min 3X/week   Barriers to discharge Inaccessible home environment;Decreased caregiver support will potentially need 2 person assist to walk    Co-evaluation               AM-PAC PT "6 Clicks" Mobility  Outcome Measure Help needed turning from your back to your side while in a flat bed without using bedrails?: A Little Help needed moving from lying on your back to sitting on the side of a flat bed without using bedrails?: A Lot Help needed moving to and from a bed to a chair (including a wheelchair)?: A Lot Help needed standing up from a chair using your arms (e.g., wheelchair or bedside chair)?: A Lot Help needed to walk in hospital room?: A Lot Help needed climbing 3-5 steps with a railing? : Total 6 Click Score: 12    End of Session Equipment Utilized During Treatment: Gait belt Activity Tolerance: Patient limited by fatigue;Treatment limited secondary to medical complications (Comment) Patient left: in bed;with call bell/phone within reach;with bed alarm set Nurse Communication: Mobility status PT Visit Diagnosis: Unsteadiness on feet (R26.81);Muscle weakness (generalized) (M62.81);Difficulty in walking, not elsewhere classified (R26.2)     Time: 9390-3009 PT Time Calculation (min) (ACUTE ONLY): 20 min   Charges:   PT Evaluation $PT Eval Moderate Complexity: 1 Mod         Ramond Dial 11/19/2019, 9:12 PM  [name] Hives, PT MS Acute Rehab Dept. Number: [place] and [place]

## 2019-11-19 NOTE — Discharge Instructions (Signed)

## 2019-11-19 NOTE — Progress Notes (Signed)
  Echocardiogram 2D Echocardiogram has been performed.  [name] M 11/19/2019, 11:15 AM

## 2019-11-19 NOTE — Progress Notes (Signed)
PROGRESS NOTE    [name]  FUX:[id number] DOB: [birth year] DOA: 11/18/2019 PCP: Bernerd Limbo, MD    No chief complaint on file.   Brief Narrative:  HPI per Dr. [name] is a 86 y.o. female with medical history significant of systolic CHF, hypertension, CKD stage IV, IIDM, Chronic a-fib on Eliquis, resented with increasing short of breath and weight gaining.  Patient been feeling short of breath started about 3 days ago, increasingly getting worse.  Patient had a recent stress fracture on right ankle was treated conservatively with orthopedic surgery, and she has not been as active as before the fracture.  She says mostly the pain is controlled, she denies any chest pain no cough no fever or chills. ED Course: Chest x-ray showed cardiomegaly and pulmonary congestion, blood work showed elevation of creatinine level from 2.7-3.2 today BUN 109.    Assessment & Plan:   Active Problems:   CHF (congestive heart failure) (HCC)   Acute renal failure superimposed on stage 4 chronic kidney disease (HCC)   Chronic a-fib (HCC)   Acidosis   DM (diabetes mellitus), type 2 with complications (HCC)  1 acute on chronic systolic heart failure Patient presented with worsening shortness of breath, increased weight gain, chest x-ray concerning for cardiomegaly and pulmonary congestion.  Patient noted to have been volume overloaded on presentation.  BNP elevated at 781.6 on admission.  Troponins slightly elevated but seem to be plateaued.  Patient denied any ongoing chest pain.  2D echo pending.  Patient received a dose of IV Lasix in the ED on presentation.  Urine output not properly recorded.  Current weight of 108.91 pounds from 136 pounds on admission which I do not think is accurate.  Patient improving clinically.  IV Bumex, Coreg, hydralazine, Imdur. Consult with cardiology for further evaluation and management.  2.  Acute kidney injury on chronic kidney disease stage IV Likely  secondary to problem #1/cardiorenal syndrome.  Per admitting physician patient is a DNR and has declined HD in the past.  Patient on IV diuretics currently on Bumex with urine output and some clinical improvement.  2D echo pending.  Creatinine trending down currently at 2.99 from 3.27 on admission.  Continue diuresis.  Avoid nephrotoxic agents.  Follow.  3. hypertension Currently well controlled.  Continue Coreg, Bumex, hydralazine, Imdur.  4.  Acidosis Secondary to problem #2.  Continue bicarb tablets.  Follow.  5.  Diabetes mellitus type 2 Hemoglobin A1c 6.8.  CBG of 135.  Hold saxagliptin.  Continue sliding scale insulin.  Follow.  6.  Chronic A. fib Continue Coreg for rate control.  Eliquis for anticoagulation.   DVT prophylaxis: Eliquis Code Status: DNR Family Communication: Updated patient and daughter at bedside. Disposition:   Status is: Inpatient    Dispo: [name] from: Home              Anticipated d/c is to: To be determined.              Anticipated d/c date is: 2 to 3 days.              Patient currently in acute CHF exacerbation currently on IV diuretics, cardiology consultation pending, and acute on chronic kidney disease.       Consultants:   Cardiology: Pending  Procedures:   2D echo pending  Chest x-ray 11/18/2019, 11/19/2019    Antimicrobials:  None   Subjective: Sitting up at bedside.  Feels shortness of breath has improved  since admission.  Denies any chest pain.  No abdominal pain.  Feeling better than on admission.  Currently getting the echo done.  Objective: Vitals:   11/19/19 0031 11/19/19 0317 11/19/19 0806 11/19/19 1211  BP: (!) 167/96 (!) 152/78 (!) 142/77 125/81  Pulse: 94 98 86 94  Resp: 19 19 18 18   Temp: 97.9 F (36.6 C) 97.6 F (36.4 C) 97.9 F (36.6 C) 97.9 F (36.6 C)  TempSrc: Oral Oral Oral Oral  SpO2: 96% 95% 96% 93%  Weight:  49.4 kg    Height:        Intake/Output Summary (Last 24 hours) at 11/19/2019  1604 Last data filed at 11/19/2019 1400 Gross per 24 hour  Intake 470 ml  Output 751 ml  Net -281 ml   Filed Weights   11/18/19 1243 11/19/19 0317  Weight: 61.7 kg 49.4 kg    Examination:  General exam: Appears calm and comfortable  Respiratory system: Bibasilar crackles. Cardiovascular system: Irregularly irregular. + JVD, no murmurs, rubs, gallops or clicks.  Trace to 1+ lower extremity edema. Gastrointestinal system: Abdomen is nondistended, soft and nontender. No organomegaly or masses felt. Normal bowel sounds heard. Central nervous system: Alert and oriented. No focal neurological deficits. Extremities: Symmetric 5 x 5 power. Skin: No rashes, lesions or ulcers Psychiatry: Judgement and insight appear normal. Mood & affect appropriate.     Data Reviewed: I have personally reviewed following labs and imaging studies  CBC: Recent Labs  Lab 11/18/19 1245  WBC 9.8  HGB 11.5*  HCT 34.9*  MCV 92.3  PLT 627    Basic Metabolic Panel: Recent Labs  Lab 11/18/19 1245 11/19/19 0006  NA 142 140  K 4.5 3.7  CL 111 108  CO2 15* 17*  GLUCOSE 176* 154*  BUN 109* 107*  CREATININE 3.27* 2.99*  CALCIUM 9.8 9.7  MG  --  2.6*  PHOS  --  5.2*    GFR: Estimated Creatinine Clearance: 9.6 mL/min (A) (by C-G formula based on SCr of 2.99 mg/dL (H)).  Liver Function Tests: No results for input(s): AST, ALT, ALKPHOS, BILITOT, PROT, ALBUMIN in the last 168 hours.  CBG: Recent Labs  Lab 11/18/19 2143 11/19/19 0620 11/19/19 1208  GLUCAP 155* 135* 134*     Recent Results (from the past 240 hour(s))  SARS Coronavirus 2 by RT PCR (hospital order, performed in [facility] hospital lab) Nasopharyngeal Nasopharyngeal Swab     Status: None   Collection Time: 11/19/19  8:00 AM   Specimen: Nasopharyngeal Swab  Result Value Ref Range Status   SARS Coronavirus 2 NEGATIVE NEGATIVE Final    Comment: (NOTE) SARS-CoV-2 target nucleic acids are NOT DETECTED.  The SARS-CoV-2 RNA is  generally detectable in upper and lower respiratory specimens during the acute phase of infection. The lowest concentration of SARS-CoV-2 viral copies this assay can detect is 250 copies / mL. A negative result does not preclude SARS-CoV-2 infection and should not be used as the sole basis for treatment or other patient management decisions.  A negative result may occur with improper specimen collection / handling, submission of specimen other than nasopharyngeal swab, presence of viral mutation(s) within the areas targeted by this assay, and inadequate number of viral copies (<250 copies / mL). A negative result must be combined with clinical observations, patient history, and epidemiological information.  Fact Sheet for Patients:   StrictlyIdeas.no  Fact Sheet for Healthcare Providers: BankingDealers.co.za  This test is not yet approved or  cleared by  the [employer] and has been authorized for detection and/or diagnosis of SARS-CoV-2 by FDA under an Emergency Use Authorization (EUA).  This EUA will remain in effect (meaning this test can be used) for the duration of the COVID-19 declaration under Section 564(b)(1) of the Act, 21 U.S.C. section 360bbb-3(b)(1), unless the authorization is terminated or revoked sooner.  Performed at [facility], [place] [address], [place], [place] [address]          Radiology Studies: DG Chest 2 View  Result Date: 11/19/2019 CLINICAL DATA:  Shortness of breath EXAM: CHEST - 2 VIEW COMPARISON:  11/18/2019 FINDINGS: Cardiac shadow remains enlarged. Aortic calcifications are again seen. Prominent central vascularity is noted with mild interstitial edema stable from the previous exam. Left-sided pleural effusion is noted and stable. No new focal abnormality is seen. Old rib fractures and chronic shoulder degenerative changes are seen. IMPRESSION: Stable appearance of the chest with mild changes  of CHF. Electronically Signed   By: Inez Catalina M.D.   On: 11/19/2019 09:49   DG Chest 2 View  Result Date: 11/18/2019 CLINICAL DATA:  86 year old female with history of shortness of breath. EXAM: CHEST - 2 VIEW COMPARISON:  Chest x-ray 11/20/2018. FINDINGS: Lung volumes are low. Opacity at the left base which may reflect atelectasis and/or consolidation. Right lung is clear. No definite pleural effusions. Cephalization of the pulmonary vasculature, without frank pulmonary edema. No pneumothorax. Moderate cardiomegaly. Upper mediastinal contours are distorted by patient's position. Aortic atherosclerosis. Multiple old healed left-sided rib fractures. Advanced degenerative changes in the glenohumeral joints bilaterally (left greater than right). IMPRESSION: 1. Low lung volumes with atelectasis and/or consolidation in the left lower lobe. 2. Moderate cardiomegaly with pulmonary venous congestion, but no frank pulmonary edema. 3. Aortic atherosclerosis. Electronically Signed   By: Vinnie Langton M.D.   On: 11/18/2019 13:47   ECHOCARDIOGRAM COMPLETE  Result Date: 11/19/2019    ECHOCARDIOGRAM REPORT   Patient Name:   [name] Date of Exam: 11/19/2019 Medical Rec #:  [id number]     Height:       62.0 in Accession #:    [id number]    Weight:       108.9 lb Date of Birth:  [birth year]    BSA:          1.476 m Patient Age:    89 years      BP:           142/77 mmHg Patient Gender: F             HR:           86 bpm. Exam Location:  Inpatient Procedure: 2D Echo and 3D Echo Indications:    CHF-Acute Systolic 644.03 / K74.25  History:        Patient has prior history of Echocardiogram examinations, most                 recent 08/03/2015. Arrythmias:Atrial Fibrillation; Risk                 Factors:Hypertension, Dyslipidemia and Diabetes. Chronic kidney                 disease, past history of breast cancer, cardiomyopathy.  Sonographer:    Darlina Sicilian RDCS Referring Phys: [id number] [place]  1. Left  ventricular ejection fraction, by estimation, is 40 to 45%. The left ventricle has mildly decreased function. The left ventricle demonstrates global hypokinesis. Left ventricular diastolic function  could not be evaluated.  2. Right ventricular systolic function is mildly reduced. The right ventricular size is mildly enlarged. There is moderately elevated pulmonary artery systolic pressure. The estimated right ventricular systolic pressure is 11.9 mmHg.  3. Left atrial size was severely dilated.  4. Right atrial size was severely dilated.  5. The mitral valve is normal in structure. Mild mitral valve regurgitation. No evidence of mitral stenosis.  6. Tricuspid valve regurgitation is moderate to severe.  7. The aortic valve is normal in structure. Aortic valve regurgitation is moderate. No aortic stenosis is present.  8. Aortic dilatation noted. There is moderate to severe dilatation of the ascending aorta measuring 46 mm. FINDINGS  Left Ventricle: Left ventricular ejection fraction, by estimation, is 40 to 45%. The left ventricle has mildly decreased function. The left ventricle demonstrates global hypokinesis. The left ventricular internal cavity size was normal in size. There is  no left ventricular hypertrophy. Left ventricular diastolic function could not be evaluated due to atrial fibrillation. Left ventricular diastolic function could not be evaluated. The ratio of pulmonic flow to systemic flow (Qp/Qs ratio) is 0.60. Right Ventricle: The right ventricular size is mildly enlarged. No increase in right ventricular wall thickness. Right ventricular systolic function is mildly reduced. There is moderately elevated pulmonary artery systolic pressure. The tricuspid regurgitant velocity is 3.52 m/s, and with an assumed right atrial pressure of 8 mmHg, the estimated right ventricular systolic pressure is 14.7 mmHg. Left Atrium: Left atrial size was severely dilated. Right Atrium: Right atrial size was severely  dilated. Pericardium: There is no evidence of pericardial effusion. Mitral Valve: The mitral valve is normal in structure. Mild mitral valve regurgitation. No evidence of mitral valve stenosis. Tricuspid Valve: The tricuspid valve is normal in structure. Tricuspid valve regurgitation is moderate to severe. No evidence of tricuspid stenosis. Aortic Valve: The aortic valve is normal in structure. Aortic valve regurgitation is moderate. No aortic stenosis is present. Pulmonic Valve: The pulmonic valve was grossly normal. Pulmonic valve regurgitation is mild. No evidence of pulmonic stenosis. Aorta: Aortic dilatation noted. There is moderate to severe dilatation of the ascending aorta measuring 46 mm. IAS/Shunts: The atrial septum is grossly normal. The ratio of pulmonic flow to systemic flow (Qp/Qs ratio) is 0.60.  LEFT VENTRICLE PLAX 2D LVIDd:         5.20 cm LVIDs:         4.00 cm LV PW:         0.90 cm LV IVS:        1.00 cm LVOT diam:     2.20 cm LV SV:         57 LV SV Index:   38 LVOT Area:     3.80 cm  LV Volumes (MOD) LV vol d, MOD A2C: 155.0 ml LV vol d, MOD A4C: 93.0 ml LV vol s, MOD A2C: 83.5 ml LV vol s, MOD A4C: 57.0 ml LV SV MOD A2C:     71.5 ml LV SV MOD A4C:     93.0 ml LV SV MOD BP:      55.4 ml RIGHT VENTRICLE RVOT diam:      2.10 cm LEFT ATRIUM             Index       RIGHT ATRIUM           Index LA diam:        3.80 cm 2.57 cm/m  RA Area:     30.00  cm LA Vol (A2C):   92.1 ml 62.38 ml/m RA Volume:   105.00 ml 71.11 ml/m LA Vol (A4C):   74.0 ml 50.12 ml/m LA Biplane Vol: 84.7 ml 57.37 ml/m  AORTIC VALVE             PULMONIC VALVE LVOT Vmax:   98.80 cm/s  RVOT Peak grad: 2 mmHg LVOT Vmean:  64.300 cm/s LVOT VTI:    0.149 m  AORTA Ao Root diam: 4.10 cm Ao Asc diam:  4.60 cm MITRAL VALVE               TRICUSPID VALVE MV Area (PHT): 6.00 cm    TR Peak grad:   49.6 mmHg MV Decel Time: 127 msec    TR Vmax:        352.00 cm/s MV E velocity: 95.30 cm/s                            SHUNTS                             Systemic VTI:  0.15 m                            Systemic Diam: 2.20 cm                            Pulmonic VTI:  0.096 m                            Pulmonic Diam: 2.10 cm                            Qp/Qs:         0.58 Mertie Moores MD Electronically signed by Mertie Moores MD Signature Date/Time: 11/19/2019/1:34:03 PM    Final         Scheduled Meds: . apixaban  2.5 mg Oral BID  . bumetanide (BUMEX) IV  0.5 mg Intravenous Q12H  . carvedilol  6.25 mg Oral BID WC  . ferrous sulfate  325 mg Oral BID WC  . hydrALAZINE  25 mg Oral TID  . insulin aspart  0-9 Units Subcutaneous TID WC  . isosorbide mononitrate  30 mg Oral Daily  . LORazepam  0.5 mg Oral QHS  . polyethylene glycol  17 g Oral Daily  . pregabalin  50 mg Oral Daily  . sodium bicarbonate  650 mg Oral TID  . sodium chloride  1 drop Left Eye QID  . sodium chloride flush  3 mL Intravenous Once  . sodium chloride flush  3 mL Intravenous Q12H   Continuous Infusions: . sodium chloride    . cefTRIAXone (ROCEPHIN)  IV Stopped (11/18/19 1933)     LOS: 1 day    Time spent: 40 minutes    Irine Seal, MD [facility]   To contact the attending provider between 7A-7P or the covering provider during after hours 7P-7A, please log into the web site [email or web address] and access using universal [place] password for that web site. If you do not have the password, please call the hospital operator.  11/19/2019, 4:04 PM

## 2019-11-19 NOTE — Consult Note (Addendum)
Cardiology Consultation:   Patient ID: [name] MRN: [id number]; DOB: 09-Oct-1925  Admit date: 11/18/2019 Date of Consult: 11/19/2019  Primary Care Provider: Bernerd Limbo, MD [facility] Cardiologist: Kirk Ruths, MD  [facility] Electrophysiologist:  None    Patient Profile:   [name] is a 86 y.o. female with a hx of atrial fibrillation, congestive heart failure (EF 55 to 60%), mild AI, and mild MR who is being seen today for the evaluation of CHF at the request of Dr. Grandville Silos.  History of Present Illness:   [name] followed by Dr. Stanford Breed for the above cardiac issues.  She had a fall in 04/2015 resulting in rib fractures and fracture of right scapula, treated conservatively.  She was noted to be in A. fib at that time.  Echo revealed EF of 55 to 60%, mild AI and mild MR.  She was treated with rate control and discharged.  Readmitted 07/2015 with CHF.  Echo repeated showed decreased EF to 35 to 40%, biatrial enlargement, moderate AI, severe MR and mild to moderate tricuspid regurgitation.  Patient requested only conservative measures given her age which was thought to be appropriate.  Also has renal insufficiency.  Patient fell June 2020 and suffered a left hip fracture require surgical intervention.  She was last seen 10/16/2019 and overall doing well.  Reported rare dyspnea and occasional pain in her left chest with no exertional symptoms.  She takes Demadex.  The patient presented to the ER 11/18/2019 for shortness of breath.  She reported over the last 2 to 3 days she has had worsening shortness of breath and weight gain. She reports a weight gain of 3 pounds. Denies orthopnea. No chest pain, fevers, chills, nausea, vomiting. She does not use oxygen at baseline.  EMS was called and found her to be hypoxic, 85% on room air, and started on supplemental oxygen.  In the ED blood pressure 157/133, pulse 84, afebrile, respiratory rate 32.  Labs showed CO2 15, glucose 176,  creatinine 3.27, BUN 109, hemoglobin 11.5. HS troponin 46>50.  EKG showed A. Fib, 83 bpm, LBBB.  Chest x-ray showing possible consolidation in the left lower lobe, moderate cardiomegaly with pulmonary venous congestion but no frank pulmonary edema. she received IV Lasix in the ED.   Past Medical History:  Diagnosis Date  .  Rib fractures - right #8-10 05/09/2015  . Acute and chronic respiratory failure (acute-on-chronic) ([place]) 12/23/2017  . Acute on chronic congestive heart failure ([place])   . AKI (acute kidney injury) ([place]) 05/10/2015  . Atrial fibrillation ([place]) 05/09/2015  . Cancer [facility])    breast cancer  . Cardiomyopathy ([place])    a. 04/2015 Echo: EF 55-60%;  b. 07/2015 Echo: EF 35-40%, mod LVH, antsep DK, mod AI, sev MR, sev dil LA, mildly dil RA, mild-mod TR, PASP 40mmHg-->conservatively managed.  . CHF exacerbation ([place]) 08/02/2015  . Chronic systolic CHF (congestive heart failure) ([place])    a. 07/2015 Echo: EF 35-40%.  . CKD (chronic kidney disease), stage IV ([place])   . CKD (chronic kidney disease), stage V ([place]) 12/23/2017  . Closed left hip fracture, initial encounter ([place]) 11/20/2018  . Congestive dilated cardiomyopathy ([place]) 08/24/2015  . Congestive heart failure (CHF) ([place]) 08/24/2015  . Diabetes mellitus without complication ([place])   . Dyspnea 08/02/2015  . Elevated lactic acid level 08/02/2015  . Essential hypertension   . Fall 05/09/2015  . History of breast cancer 05/09/2015  . HLD (hyperlipidemia)   . Mitral regurgitation 08/24/2015  .  PAF (paroxysmal atrial fibrillation) ([place])    a. diagnosed 04/2015 --> conservative mgmt with rate control and xarelto.  [name] Permanent atrial fibrillation ([place])    a. diagnosed 04/2015 --> conservative mgmt with rate control and xarelto.  [name] Postoperative anemia due to acute blood loss 11/23/2018  . Pulsatile neck mass    a. 11/2015 Neck U/S: pulsatile R neck mass correlates w/ a toruous and mildy ectatic segment of the R SCA measuring ~ 2.1 cm in max  diameter - no change since noted on 12/16 CT.  [name] Scapula fracture 05/09/2015  . Severe mitral regurgitation by prior echocardiogram    a. 07/2015 sev MR    Past Surgical History:  Procedure Laterality Date  . ABDOMINAL HYSTERECTOMY    . BREAST LUMPECTOMY    . FRACTURE SURGERY     L femur  . JOINT REPLACEMENT     knees  . ORIF HIP FRACTURE Left 11/21/2018   Procedure: OPEN REDUCTION INTERNAL FIXATION  OF THE LEFT HIP;  Surgeon: Shona Needles, MD;  Location: [facility];  Service: Orthopedics;  Laterality: Left;     Home Medications:  Prior to Admission medications   Medication Sig Start Date End Date Taking? Authorizing Provider  acetaminophen (TYLENOL) 500 MG tablet Take 1,000 mg by mouth every 6 (six) hours as needed for headache (pain).    Yes [provider]  apixaban (ELIQUIS) 2.5 MG TABS tablet Take 1 tablet (2.5 mg total) by mouth 2 (two) times daily. 08/27/18  Yes Lelon Perla, MD  carvedilol (COREG) 6.25 MG tablet Take 1 tablet (6.25 mg total) by mouth 2 (two) times daily with a meal. 09/22/19  Yes Crenshaw, Denice Bors, MD  diltiazem (CARDIZEM CD) 120 MG 24 hr capsule Take 1 capsule (120 mg total) by mouth daily. 10/10/19  Yes Lelon Perla, MD  ferrous sulfate 325 (65 FE) MG tablet Take 325 mg by mouth 2 (two) times daily with a meal.    Yes [provider]  hydrALAZINE (APRESOLINE) 10 MG tablet Take 1 tablet (10 mg total) by mouth 3 (three) times daily. 06/17/19  Yes Lelon Perla, MD  isosorbide mononitrate (IMDUR) 30 MG 24 hr tablet Take 1 tablet (30 mg total) by mouth daily. 06/17/19  Yes Lelon Perla, MD  LORazepam (ATIVAN) 0.5 MG tablet Take 0.5 mg by mouth at bedtime.  01/01/18  Yes [provider]  LUTEIN PO Take 25 mg by mouth daily.    Yes [provider]  OXYGEN Inhale 3 L into the lungs See admin instructions. Use every night at bedtime and during the day if needed for shortness of breath   Yes [provider]    polyethylene glycol (MIRALAX / GLYCOLAX) packet Take 17 g by mouth daily.  05/13/15  Yes [provider]  pregabalin (LYRICA) 50 MG capsule Take 50 mg by mouth daily.   Yes [provider]  Resveratrol 250 MG CAPS Take 250 mg by mouth daily.   Yes [provider]  saxagliptin HCl (ONGLYZA) 2.5 MG TABS tablet Take 2.5 mg by mouth daily.  06/05/16  Yes [provider]  sodium chloride (MURO 128) 5 % ophthalmic solution Place 1 drop into the left eye 4 (four) times daily.   Yes [provider]  sodium chloride (OCEAN) 0.65 % SOLN nasal spray Place 1 spray into both nostrils as needed for congestion.   Yes [provider]  torsemide (DEMADEX) 20 MG tablet Take 2 tablets (  40 mg) alternating with 20 mg every other day. Patient taking differently: Take 20-40 mg by mouth See admin instructions. Take 2 tablets (40 mg) alternating with 20 mg every other day. 02/06/19  Yes Lelon Perla, MD  diclofenac Sodium (VOLTAREN) 1 % GEL Apply 2 g topically 4 (four) times daily. Patient not taking: Reported on 11/18/2019 08/21/19   Aundra Dubin, PA-C  HYDROcodone-acetaminophen (NORCO/VICODIN) 5-325 MG tablet Take 1 tablet by mouth every 12 (twelve) hours as needed for moderate pain. Patient not taking: Reported on 11/18/2019 10/24/19 10/23/20  Magnant, Gerrianne Scale, PA-C  methylPREDNISolone (MEDROL) 4 MG tablet TAKE AS DIRECTED SEE ATTACHED SHEET Patient not taking: Reported on 11/18/2019 08/21/19   [provider]  Vitamin D, Ergocalciferol, (DRISDOL) 1.25 MG (50000 UNIT) CAPS capsule Take 1 capsule (50,000 Units total) by mouth every 7 (seven) days. Patient not taking: Reported on 11/18/2019 11/05/19   Donella Stade, PA-C    Inpatient Medications: Scheduled Meds: . apixaban  2.5 mg Oral BID  . bumetanide (BUMEX) IV  0.5 mg Intravenous Q12H  . carvedilol  6.25 mg Oral BID WC  . ferrous sulfate  325 mg Oral BID WC  . hydrALAZINE  25 mg Oral TID  .  insulin aspart  0-9 Units Subcutaneous TID WC  . isosorbide mononitrate  30 mg Oral Daily  . LORazepam  0.5 mg Oral QHS  . polyethylene glycol  17 g Oral Daily  . pregabalin  50 mg Oral Daily  . sodium bicarbonate  650 mg Oral TID  . sodium chloride  1 drop Left Eye QID  . sodium chloride flush  3 mL Intravenous Once  . sodium chloride flush  3 mL Intravenous Q12H   Continuous Infusions: . sodium chloride    . cefTRIAXone (ROCEPHIN)  IV Stopped (11/18/19 1933)   PRN Meds: sodium chloride, acetaminophen, metoprolol tartrate, ondansetron (ZOFRAN) IV, sodium chloride, sodium chloride flush  Allergies:    Allergies  Allergen Reactions  . Codeine Other (See Comments)    "talks out of her head"   . Neurontin [Gabapentin] Swelling    Leg swelling  . Oxycodone Other (See Comments)    confusion     Social History:   Social History   Socioeconomic History  . Marital status: Widowed    Spouse name: Not on file  . Number of children: Not on file  . Years of education: Not on file  . Highest education level: Not on file  Occupational History  . Not on file  Tobacco Use  . Smoking status: Never Smoker  . Smokeless tobacco: Never Used  Vaping Use  . Vaping Use: Never used  Substance and Sexual Activity  . Alcohol use: No    Alcohol/week: 0.0 standard drinks  . Drug use: Not Currently  . Sexual activity: Never  Other Topics Concern  . Not on file  Social History Narrative  . Not on file   Social Determinants of Health   Financial Resource Strain:   . Difficulty of Paying Living Expenses:   Food Insecurity:   . Worried About Charity fundraiser in the Last Year:   . Arboriculturist in the Last Year:   Transportation Needs:   . Film/video editor (Medical):   [name] Lack of Transportation (Non-Medical):   Physical Activity:   . Days of Exercise per Week:   . Minutes of Exercise per Session:   Stress:   . Feeling of Stress :  Social Connections:   . Frequency of  Communication with Friends and Family:   . Frequency of Social Gatherings with Friends and Family:   . Attends Religious Services:   . Active Member of Clubs or Organizations:   . Attends Archivist Meetings:   [name] Marital Status:   Intimate Partner Violence:   . Fear of Current or Ex-Partner:   . Emotionally Abused:   [name] Physically Abused:   . Sexually Abused:     Family History:   Family History  Problem Relation Age of Onset  . Coronary artery disease Father   . Stroke Mother      ROS:  Please see the history of present illness.  All other ROS reviewed and negative.     Physical Exam/Data:   Vitals:   11/19/19 0031 11/19/19 0317 11/19/19 0806 11/19/19 1211  BP: (!) 167/96 (!) 152/78 (!) 142/77 125/81  Pulse: 94 98 86 94  Resp: 19 19 18 18   Temp: 97.9 F (36.6 C) 97.6 F (36.4 C) 97.9 F (36.6 C) 97.9 F (36.6 C)  TempSrc: Oral Oral Oral Oral  SpO2: 96% 95% 96% 93%  Weight:  49.4 kg    Height:        Intake/Output Summary (Last 24 hours) at 11/19/2019 1512 Last data filed at 11/19/2019 1400 Gross per 24 hour  Intake 470 ml  Output 751 ml  Net -281 ml   Last 3 Weights 11/19/2019 11/18/2019 10/16/2019  Weight (lbs) 108 lb 14.5 oz 136 lb 132 lb  Weight (kg) 49.4 kg 61.689 kg 59.875 kg     Body mass index is 19.92 kg/m.  General:  Well nourished, well developed, in no acute distress HEENT: normal Lymph: no adenopathy Neck: no JVD Endocrine:  No thryomegaly Vascular: No carotid bruits; FA pulses 2+ bilaterally without bruits  Cardiac:  normal S1, S2; Irreg Irreg; + murmur  Lungs:  Mild crackles  Abd: soft, nontender, no hepatomegaly  Ext: TRace edema Musculoskeletal:  No deformities, BUE and BLE strength normal and equal Skin: warm and dry  Neuro:  CNs 2-12 intact, no focal abnormalities noted Psych:  Normal affect   EKG:  The EKG was personally reviewed and demonstrates: A. fib, 83 bpm, LBBB, no acute changes Telemetry:  Telemetry was personally  reviewed and demonstrates:  NSR, HR 80-90, PVCs  Relevant CV Studies:  Echo 07/2015 Study Conclusions   - Left ventricle: Wall thickness was increased in a pattern of  moderate LVH. There was focal basal hypertrophy. Systolic  function was moderately reduced. The estimated ejection fraction  was in the range of 35% to 40%. Dyskinesis of the anteroseptal  myocardium.  - Aortic valve: There was moderate regurgitation.  - Mitral valve: There was severe regurgitation.  - Left atrium: The atrium was severely dilated.  - Right atrium: The atrium was mildly dilated.  - Tricuspid valve: There was mild-moderate regurgitation.  - Pulmonary arteries: Systolic pressure was mildly increased. PA  peak pressure: 33 mm Hg (S).   Laboratory Data:  High Sensitivity Troponin:   Recent Labs  Lab 11/18/19 1245 11/18/19 1432  TROPONINIHS 46* 50*     Chemistry Recent Labs  Lab 11/18/19 1245 11/19/19 0006  NA 142 140  K 4.5 3.7  CL 111 108  CO2 15* 17*  GLUCOSE 176* 154*  BUN 109* 107*  CREATININE 3.27* 2.99*  CALCIUM 9.8 9.7  GFRNONAA 12* 13*  GFRAA 14* 15*  ANIONGAP 16* 15    No results  for input(s): PROT, ALBUMIN, AST, ALT, ALKPHOS, BILITOT in the last 168 hours. Hematology Recent Labs  Lab 11/18/19 1245  WBC 9.8  RBC 3.78*  HGB 11.5*  HCT 34.9*  MCV 92.3  MCH 30.4  MCHC 33.0  RDW 15.2  PLT 286   BNP Recent Labs  Lab 11/18/19 1245  BNP 781.6*    DDimer No results for input(s): DDIMER in the last 168 hours.   Radiology/Studies:  DG Chest 2 View  Result Date: 11/19/2019 CLINICAL DATA:  Shortness of breath EXAM: CHEST - 2 VIEW COMPARISON:  11/18/2019 FINDINGS: Cardiac shadow remains enlarged. Aortic calcifications are again seen. Prominent central vascularity is noted with mild interstitial edema stable from the previous exam. Left-sided pleural effusion is noted and stable. No new focal abnormality is seen. Old rib fractures and chronic shoulder degenerative  changes are seen. IMPRESSION: Stable appearance of the chest with mild changes of CHF. Electronically Signed   By: Inez Catalina M.D.   On: 11/19/2019 09:49   DG Chest 2 View  Result Date: 11/18/2019 CLINICAL DATA:  86 year old female with history of shortness of breath. EXAM: CHEST - 2 VIEW COMPARISON:  Chest x-ray 11/20/2018. FINDINGS: Lung volumes are low. Opacity at the left base which may reflect atelectasis and/or consolidation. Right lung is clear. No definite pleural effusions. Cephalization of the pulmonary vasculature, without frank pulmonary edema. No pneumothorax. Moderate cardiomegaly. Upper mediastinal contours are distorted by patient's position. Aortic atherosclerosis. Multiple old healed left-sided rib fractures. Advanced degenerative changes in the glenohumeral joints bilaterally (left greater than right). IMPRESSION: 1. Low lung volumes with atelectasis and/or consolidation in the left lower lobe. 2. Moderate cardiomegaly with pulmonary venous congestion, but no frank pulmonary edema. 3. Aortic atherosclerosis. Electronically Signed   By: Vinnie Langton M.D.   On: 11/18/2019 13:47   ECHOCARDIOGRAM COMPLETE  Result Date: 11/19/2019    ECHOCARDIOGRAM REPORT   Patient Name:   YANELI KEITHLEY Date of Exam: 11/19/2019 Medical Rec #:  [id number]     Height:       62.0 in Accession #:    [id number]    Weight:       108.9 lb Date of Birth:  [birth year]    BSA:          1.476 m Patient Age:    66 years      BP:           142/77 mmHg Patient Gender: F             HR:           86 bpm. Exam Location:  Inpatient Procedure: 2D Echo and 3D Echo Indications:    CHF-Acute Systolic 829.56 / O13.08  History:        Patient has prior history of Echocardiogram examinations, most                 recent 08/03/2015. Arrythmias:Atrial Fibrillation; Risk                 Factors:Hypertension, Dyslipidemia and Diabetes. Chronic kidney                 disease, past history of breast cancer, cardiomyopathy.  Sonographer:     Darlina Sicilian RDCS Referring Phys: [id number] [place]  1. Left ventricular ejection fraction, by estimation, is 40 to 45%. The left ventricle has mildly decreased function. The left ventricle demonstrates global hypokinesis. Left ventricular diastolic function could not be evaluated.  2. Right ventricular  systolic function is mildly reduced. The right ventricular size is mildly enlarged. There is moderately elevated pulmonary artery systolic pressure. The estimated right ventricular systolic pressure is 32.2 mmHg.  3. Left atrial size was severely dilated.  4. Right atrial size was severely dilated.  5. The mitral valve is normal in structure. Mild mitral valve regurgitation. No evidence of mitral stenosis.  6. Tricuspid valve regurgitation is moderate to severe.  7. The aortic valve is normal in structure. Aortic valve regurgitation is moderate. No aortic stenosis is present.  8. Aortic dilatation noted. There is moderate to severe dilatation of the ascending aorta measuring 46 mm. FINDINGS  Left Ventricle: Left ventricular ejection fraction, by estimation, is 40 to 45%. The left ventricle has mildly decreased function. The left ventricle demonstrates global hypokinesis. The left ventricular internal cavity size was normal in size. There is  no left ventricular hypertrophy. Left ventricular diastolic function could not be evaluated due to atrial fibrillation. Left ventricular diastolic function could not be evaluated. The ratio of pulmonic flow to systemic flow (Qp/Qs ratio) is 0.60. Right Ventricle: The right ventricular size is mildly enlarged. No increase in right ventricular wall thickness. Right ventricular systolic function is mildly reduced. There is moderately elevated pulmonary artery systolic pressure. The tricuspid regurgitant velocity is 3.52 m/s, and with an assumed right atrial pressure of 8 mmHg, the estimated right ventricular systolic pressure is 02.5 mmHg. Left Atrium: Left atrial  size was severely dilated. Right Atrium: Right atrial size was severely dilated. Pericardium: There is no evidence of pericardial effusion. Mitral Valve: The mitral valve is normal in structure. Mild mitral valve regurgitation. No evidence of mitral valve stenosis. Tricuspid Valve: The tricuspid valve is normal in structure. Tricuspid valve regurgitation is moderate to severe. No evidence of tricuspid stenosis. Aortic Valve: The aortic valve is normal in structure. Aortic valve regurgitation is moderate. No aortic stenosis is present. Pulmonic Valve: The pulmonic valve was grossly normal. Pulmonic valve regurgitation is mild. No evidence of pulmonic stenosis. Aorta: Aortic dilatation noted. There is moderate to severe dilatation of the ascending aorta measuring 46 mm. IAS/Shunts: The atrial septum is grossly normal. The ratio of pulmonic flow to systemic flow (Qp/Qs ratio) is 0.60.  LEFT VENTRICLE PLAX 2D LVIDd:         5.20 cm LVIDs:         4.00 cm LV PW:         0.90 cm LV IVS:        1.00 cm LVOT diam:     2.20 cm LV SV:         57 LV SV Index:   38 LVOT Area:     3.80 cm  LV Volumes (MOD) LV vol d, MOD A2C: 155.0 ml LV vol d, MOD A4C: 93.0 ml LV vol s, MOD A2C: 83.5 ml LV vol s, MOD A4C: 57.0 ml LV SV MOD A2C:     71.5 ml LV SV MOD A4C:     93.0 ml LV SV MOD BP:      55.4 ml RIGHT VENTRICLE RVOT diam:      2.10 cm LEFT ATRIUM             Index       RIGHT ATRIUM           Index LA diam:        3.80 cm 2.57 cm/m  RA Area:     30.00 cm LA Vol (A2C):   92.1 ml  62.38 ml/m RA Volume:   105.00 ml 71.11 ml/m LA Vol (A4C):   74.0 ml 50.12 ml/m LA Biplane Vol: 84.7 ml 57.37 ml/m  AORTIC VALVE             PULMONIC VALVE LVOT Vmax:   98.80 cm/s  RVOT Peak grad: 2 mmHg LVOT Vmean:  64.300 cm/s LVOT VTI:    0.149 m  AORTA Ao Root diam: 4.10 cm Ao Asc diam:  4.60 cm MITRAL VALVE               TRICUSPID VALVE MV Area (PHT): 6.00 cm    TR Peak grad:   49.6 mmHg MV Decel Time: 127 msec    TR Vmax:        352.00 cm/s MV  E velocity: 95.30 cm/s                            SHUNTS                            Systemic VTI:  0.15 m                            Systemic Diam: 2.20 cm                            Pulmonic VTI:  0.096 m                            Pulmonic Diam: 2.10 cm                            Qp/Qs:         0.58 Mertie Moores MD Electronically signed by Mertie Moores MD Signature Date/Time: 11/19/2019/1:34:03 PM    Final    {    Assessment and Plan:   Chronic combined systolic and diastolic CHF -BNP 616 and chest x-ray showing possible consolidation in the left lower lobe, moderate cardiomegaly with pulmonary venous congestion but no frank pulmonary edema -Received IV Lasix 40 in the ED, switched to Bumex 0.5 mg IV twice daily for mild diuresis given patient's age - CCB stopped -Coreg 6.25 twice daily -Echo repeated, it showed EF 40 to 45%, global hypokinesis, mildly elevated pulmonary artery systolic pressure -Chest x-ray today shows stable appearance with mild changes of CHF -Patient has had poor urine output only 400 mL overnight although suspect this is incomplete because patient reports she had a lot of urine output. Weight also might not be accurate. 136lbs>108lbs -Creatinine improved 3.27> 2.99 - continue with daily weights, strict I/Os and monitor creatinine with diuresis - MD to see  Permanent atrial fibrillation -Cardizem held -Toprol for rate control -Eliquis 2.5 mg twice daily for stroke prophylaxis  Cardiomyopathy -Patient has previously declined further evaluation and does not want aggressive testing or other measures.  He wants medical therapy -Continue beta-blocker, hydralazine 25 mg 3 times daily, Imdur 30 mg daily -No ARB or Entresto given baseline renal insufficiency - Echo with similar from prior with EF at 40-45%  Mild MR -Patient has requested medical therapy with no aggressive measures  AKI /CKD stage IV  -Creatinine baseline around 2.6.  Was 3.27 on admission -Followed  by nephrology as outpatient -Patient is  DNR and has declined HD in the past -Improving with diuresis - Might need nephrology consult  Goals of care -Patient is DNR  For questions or updates, please contact [place] Please consult [email or web address] for contact info under    Signed, Cadence Ninfa Meeker, PA-C  11/19/2019 3:12 PM   I have seen and examined the patient along with Cadence Ninfa Meeker, PA-C .  I have reviewed the chart, notes and new data.  I agree with PA/NP's note.  Key new complaints: "breathing is much better" (but she is clearly tachypneic and sitting straight up. Had edema ( but no dependent edema anymore). Key examination changes: JVP 11-12 cm, but no edema. Clear lungs. Irregular rhythm with paradoxically split S2, systolic murmurs at the apex, LLSB and RUSB. Today's recorded weight and in/out are clearly unreliable. Key new findings / data: echo reviewed. EF is probably worse than reported in my opinion - around 35%. There is marked systolic dyssynchrony due to LBBB.  PLAN: She restated her position firmly against invasive procedures/aggressive care and wishes DNR status. Responding well to diuresis with improving renal function (baseline creatinine appears to be 2.6-2.7). Judging by chart records, "dry weight" is around 130 lb (which confirms her estimation that she has gained only about 5 lb. AFib is well rate controlled.  Sanda Klein, MD, [place] [phone] 11/19/2019, 6:38 PM

## 2019-11-20 ENCOUNTER — Inpatient Hospital Stay (HOSPITAL_COMMUNITY): Payer: Medicare Other

## 2019-11-20 DIAGNOSIS — I4821 Permanent atrial fibrillation: Secondary | ICD-10-CM

## 2019-11-20 DIAGNOSIS — I428 Other cardiomyopathies: Secondary | ICD-10-CM

## 2019-11-20 DIAGNOSIS — I447 Left bundle-branch block, unspecified: Secondary | ICD-10-CM

## 2019-11-20 LAB — BASIC METABOLIC PANEL
Anion gap: 17 — ABNORMAL HIGH (ref 5–15)
BUN: 109 mg/dL — ABNORMAL HIGH (ref 8–23)
CO2: 17 mmol/L — ABNORMAL LOW (ref 22–32)
Calcium: 9.5 mg/dL (ref 8.9–10.3)
Chloride: 110 mmol/L (ref 98–111)
Creatinine, Ser: 3.12 mg/dL — ABNORMAL HIGH (ref 0.44–1.00)
GFR calc Af Amer: 14 mL/min — ABNORMAL LOW (ref >60)
GFR calc non Af Amer: 12 mL/min — ABNORMAL LOW (ref >60)
Glucose, Bld: 110 mg/dL — ABNORMAL HIGH (ref 70–99)
Potassium: 3.4 mmol/L — ABNORMAL LOW (ref 3.5–5.1)
Sodium: 144 mmol/L (ref 135–145)

## 2019-11-20 LAB — CBC
HCT: 31.8 % — ABNORMAL LOW (ref 36.0–46.0)
Hemoglobin: 10.4 g/dL — ABNORMAL LOW (ref 12.0–15.0)
MCH: 30.1 pg (ref 26.0–34.0)
MCHC: 32.7 g/dL (ref 30.0–36.0)
MCV: 92.2 fL (ref 80.0–100.0)
Platelets: 235 10*3/uL (ref 150–400)
RBC: 3.45 MIL/uL — ABNORMAL LOW (ref 3.87–5.11)
RDW: 15.1 % (ref 11.5–15.5)
WBC: 7.3 10*3/uL (ref 4.0–10.5)
nRBC: 0 % (ref 0.0–0.2)

## 2019-11-20 LAB — GLUCOSE, CAPILLARY
Glucose-Capillary: 108 mg/dL — ABNORMAL HIGH (ref 70–99)
Glucose-Capillary: 134 mg/dL — ABNORMAL HIGH (ref 70–99)
Glucose-Capillary: 133 mg/dL — ABNORMAL HIGH (ref 70–99)
Glucose-Capillary: 154 mg/dL — ABNORMAL HIGH (ref 70–99)

## 2019-11-20 LAB — BRAIN NATRIURETIC PEPTIDE: B Natriuretic Peptide: 911.9 pg/mL — ABNORMAL HIGH (ref 0.0–100.0)

## 2019-11-20 MED ORDER — LEVALBUTEROL HCL 0.63 MG/3ML IN NEBU
0.6300 mg | INHALATION_SOLUTION | Freq: Three times a day (TID) | RESPIRATORY_TRACT | Status: DC
  Administered 2019-11-20 (×2): 0.63 mg via RESPIRATORY_TRACT
  Filled 2019-11-20 (×2): qty 3

## 2019-11-20 MED ORDER — IPRATROPIUM BROMIDE 0.02 % IN SOLN
0.5000 mg | Freq: Two times a day (BID) | RESPIRATORY_TRACT | Status: DC
  Administered 2019-11-21: 0.5 mg via RESPIRATORY_TRACT
  Filled 2019-11-20: qty 2.5

## 2019-11-20 MED ORDER — POTASSIUM CHLORIDE CRYS ER 20 MEQ PO TBCR
40.0000 meq | EXTENDED_RELEASE_TABLET | Freq: Once | ORAL | Status: AC
  Administered 2019-11-20: 40 meq via ORAL
  Filled 2019-11-20: qty 2

## 2019-11-20 MED ORDER — IPRATROPIUM BROMIDE 0.02 % IN SOLN
0.5000 mg | Freq: Three times a day (TID) | RESPIRATORY_TRACT | Status: DC
  Administered 2019-11-20 (×2): 0.5 mg via RESPIRATORY_TRACT
  Filled 2019-11-20 (×2): qty 2.5

## 2019-11-20 MED ORDER — SODIUM CHLORIDE 0.9 % IV SOLN: 1.0000 g | INTRAVENOUS | Status: DC

## 2019-11-20 MED ORDER — LEVALBUTEROL HCL 0.63 MG/3ML IN NEBU
0.6300 mg | INHALATION_SOLUTION | Freq: Two times a day (BID) | RESPIRATORY_TRACT | Status: DC
  Administered 2019-11-21: 0.63 mg via RESPIRATORY_TRACT
  Filled 2019-11-20: qty 3

## 2019-11-20 NOTE — TOC Initial Note (Signed)
Transition of Care [facility]) - Initial/Assessment Note    Patient Details  Name: [name] MRN: [id number] Date of Birth: 09/15/1925  Transition of Care [facility]) CM/SW Contact:    [name], LCSW Phone Number: 11/20/2019, 9:55 AM  Clinical Narrative:                  Pt admitted from home. PT recommending SNF rehab. Spoke with pt today to assess and discuss PT recommendation. Per pt, she lives with her gdtr at home. Pt uses a walker for ambulation. Patient states that she does not think she needs rehab and she plans to go home at dc. Offered HH PT and pt refused. Pt agreeable to this LCSW contacting her granddaughter to update.  Spoke with gdtr, [name], who is agreeable to pt discharging home with her. She states that pt is having once weekly RN visits arranged through her insurance for the next four weeks. Discussed pt's refusal of [place] PT and gdtr asks that TOC attempt to arrange it for pt and she will talk to pt about having it. Will update RN CM of this request.   Expected Discharge Plan: Home/Self Care Barriers to Discharge: Continued Medical Work up   Patient Goals and CMS Choice        Expected Discharge Plan and Services Expected Discharge Plan: Home/Self Care In-house Referral: Clinical Social Work   Post Acute Care Choice: Resumption of Svcs/PTA Provider Living arrangements for the past 2 months: Single Family Home                                      Prior Living Arrangements/Services Living arrangements for the past 2 months: Single Family Home Lives with:: Relatives Patient language and need for interpreter reviewed:: Yes Do you feel safe going back to the place where you live?: Yes      Need for Family Participation in Patient Care: Yes (Comment) Care giver support system in place?: Yes (comment) Current home services: DME Criminal Activity/Legal Involvement Pertinent to Current Situation/Hospitalization: No - Comment as needed  Activities of  Daily Living Home Assistive Devices/Equipment: Environmental consultant (specify type), Eyeglasses, Bedside commode/3-in-1, Wheelchair, Scales, CBG Meter, Shower chair with back, Oxygen, Grab bars in shower, Hand-held shower hose ADL Screening (condition at time of admission) Patient's cognitive ability adequate to safely complete daily activities?: Yes Is the patient deaf or have difficulty hearing?: No Does the patient have difficulty seeing, even when wearing glasses/contacts?: No Does the patient have difficulty concentrating, remembering, or making decisions?: No Patient able to express need for assistance with ADLs?: Yes Does the patient have difficulty dressing or bathing?: Yes Independently performs ADLs?: No Communication: Needs assistance Is this a change from baseline?: Pre-admission baseline Dressing (OT): Needs assistance Is this a change from baseline?: Pre-admission baseline Grooming: Needs assistance Is this a change from baseline?: Pre-admission baseline Feeding: Independent Bathing: Needs assistance Is this a change from baseline?: Pre-admission baseline Toileting: Needs assistance Is this a change from baseline?: Pre-admission baseline In/Out Bed: Needs assistance Is this a change from baseline?: Pre-admission baseline Walks in Home: Needs assistance Is this a change from baseline?: Pre-admission baseline Does the patient have difficulty walking or climbing stairs?: Yes (unable climb stairs) Weakness of Legs: Right (recent stress fracture right ankle) Weakness of Arms/Hands: None  Permission Sought/Granted                  Emotional  Assessment   Attitude/Demeanor/Rapport: Engaged Affect (typically observed): Pleasant Orientation: : Oriented to Self, Oriented to Place, Oriented to  Time, Oriented to Situation Alcohol / Substance Use: Not Applicable Psych Involvement: No (comment)  Admission diagnosis:  CHF (congestive heart failure) (HCC) [I50.9] Renal dysfunction  [N28.9] Acute on chronic systolic congestive heart failure ([place]) [I50.23] Patient Active Problem List   Diagnosis Date Noted  . Acute renal failure superimposed on stage 4 chronic kidney disease ([place])   . Chronic a-fib ([place])   . Acidosis   . DM (diabetes mellitus), type 2 with complications ([place])   . CHF (congestive heart failure) ([place]) 11/18/2019  . Renal dysfunction   . Postoperative anemia due to acute blood loss 11/23/2018  . Closed left hip fracture, initial encounter ([place]) 11/20/2018  . Acute and chronic respiratory failure (acute-on-chronic) ([place]) 12/23/2017  . CKD (chronic kidney disease), stage V ([place]) 12/23/2017  . Essential hypertension   . HLD (hyperlipidemia)   . PAF (paroxysmal atrial fibrillation) ([place])   . Cardiomyopathy ([place])   . Mitral regurgitation 08/24/2015  . Congestive dilated cardiomyopathy ([place]) 08/24/2015  . Congestive heart failure (CHF) ([place]) 08/24/2015  . Acute on chronic congestive heart failure ([place])   . Dyspnea 08/02/2015  . AKI (acute kidney injury) ([place]) 05/10/2015  .  Rib fractures - right #8-10 05/09/2015  . Atrial fibrillation ([place]) 05/09/2015  . Scapula fracture 05/09/2015  . Fall 05/09/2015  . Essential hypertension, benign 05/09/2015  . Diabetes mellitus without complication ([place]) 53/97/6734  . History of breast cancer 05/09/2015   PCP:  Bernerd Limbo, MD Pharmacy:   [id number] - [place], [place] [address] Phone: [phone] Fax: [phone]     Social Determinants of Health (SDOH) Interventions    Readmission Risk Interventions Readmission Risk Prevention Plan 11/20/2019  Transportation Screening Complete  HRI or Home Care Consult Patient refused  Social Work Consult for [place] Planning/Counseling Complete  Palliative Care Screening Not Applicable  Medication Review Press photographer) Complete  Some recent data might be hidden

## 2019-11-20 NOTE — Progress Notes (Signed)
Physical Therapy Treatment Patient Details Name: [name] MRN: [id number] DOB: 10/28/1925 Today's Date: 11/20/2019    History of Present Illness 86 yo female with onset of SOB and weight gain at home was admitted, noting acute renal failure, acute CHF and AKI with acidosis from renal failure.  PMHx:  CKD 4, HTN, a-fib, DM, and pt with recent R ankle/foot stress fracture.    PT Comments    Pt making good progress.  She was able to ambulate short distances with RW, min guard, cues for PWB (step to gait), and stable O2 sats.  Granddaughter was present and reports she is able to provide 24 hr support and is comfortable taking the pt home.  They have necessary DME.  Granddaughter also reports they had been told pt could bear weight in CAM boot - advised partial wbing with step to pattern and use of RW for limited distances per MD note on 11/03/19.  Continue POC while hospitalized, did update d/c recommendations.     Follow Up Recommendations  Home health PT;Supervision/Assistance - 24 hour     Equipment Recommendations  None recommended by PT (pt has RW, shower bench, bsc, and w/c)    Recommendations for Other Services       Precautions / Restrictions Precautions Precautions: Fall Precaution Comments: PWB on R foot Required Braces or Orthoses: Other Brace Splint/Cast: R ankle boot for WB Restrictions Weight Bearing Restrictions: Yes RLE Weight Bearing: Partial weight bearing RLE Partial Weight Bearing Percentage or Pounds:  Family reports that they were told at MD office pt could WB in boot.  Reviewed Dr. Randel Pigg note for 11/03/19 which stated "Plan for patient to do partial weight.  The boot.  Avoid weightbearing as she can to allow insufficiency fracture to heal."  Advised on PWB at this time based on note.    Mobility  Bed Mobility Overal bed mobility: Needs Assistance Bed Mobility: Supine to Sit;Sit to Supine     Supine to sit: Min assist Sit to supine: Min assist    General bed mobility comments: Gave hand to pull up on to sit and assist for R leg with boot on for back to bed  Transfers Overall transfer level: Needs assistance Equipment used: Rolling walker (2 wheeled) Transfers: Sit to/from Stand Sit to Stand: Min guard         General transfer comment: CAM boot in place  Ambulation/Gait Ambulation/Gait assistance: Min guard Gait Distance (Feet): 20 Feet Assistive device: Rolling walker (2 wheeled) Gait Pattern/deviations: Step-to pattern;Decreased weight shift to right;Trunk flexed Gait velocity: decreased   General Gait Details: cued for step to pattern and PWB; pt with forward trunk/kyphotic; pt mildly unsteady requiring min guard for safety   Stairs             Wheelchair Mobility    Modified Rankin (Stroke Patients Only)       Balance Overall balance assessment: Needs assistance Sitting-balance support: Feet supported Sitting balance-Leahy Scale: Good     Standing balance support: Bilateral upper extremity supported;During functional activity Standing balance-Leahy Scale: Fair Standing balance comment: required RW                            Cognition Arousal/Alertness: Awake/alert Behavior During Therapy: WFL for tasks assessed/performed Overall Cognitive Status: Within Functional Limits for tasks assessed  Exercises      General Comments General comments (skin integrity, edema, etc.): Pt's granddaughter present.  She reports that the pt lives with her and has 24 hr support.  She is comfortable taking the pt home with HHPT and HHnursing. They have RW, w/c, bsc, and shower bench.  Pt pivots to bsc at night and only walks short distances 20-50' with supervision at this time.  Pt was on RA at arrival with sats 96% and 94% with ambulation.      Pertinent Vitals/Pain Pain Assessment: No/denies pain    Home Living                       Prior Function            PT Goals (current goals can now be found in the care plan section) Acute Rehab PT Goals Patient Stated Goal: to get home and feel stronger PT Goal Formulation: With patient/family Time For Goal Achievement: 12/03/19 Potential to Achieve Goals: Good Progress towards PT goals: Progressing toward goals    Frequency    Min 3X/week      PT Plan Discharge plan needs to be updated;Equipment recommendations need to be updated    Co-evaluation              AM-PAC PT "6 Clicks" Mobility   Outcome Measure  Help needed turning from your back to your side while in a flat bed without using bedrails?: A Little Help needed moving from lying on your back to sitting on the side of a flat bed without using bedrails?: A Little Help needed moving to and from a bed to a chair (including a wheelchair)?: A Little Help needed standing up from a chair using your arms (e.g., wheelchair or bedside chair)?: A Little Help needed to walk in hospital room?: A Little Help needed climbing 3-5 steps with a railing? : Total 6 Click Score: 16    End of Session Equipment Utilized During Treatment: Gait belt Activity Tolerance: Patient tolerated treatment well Patient left: in bed;with call bell/phone within reach;with bed alarm set;with family/visitor present Nurse Communication: Mobility status;Other (comment) (o2 sats) PT Visit Diagnosis: Unsteadiness on feet (R26.81);Muscle weakness (generalized) (M62.81);Difficulty in walking, not elsewhere classified (R26.2)     Time: 4497-5300 PT Time Calculation (min) (ACUTE ONLY): 27 min  Charges:  $Gait Training: 8-22 mins $Therapeutic Activity: 8-22 mins                     Abran Richard, PT Acute Rehab Services Pager [phone] [facility] 11/20/2019, 4:36 PM

## 2019-11-20 NOTE — TOC Initial Note (Addendum)
Transition of Care [facility]) - Initial/Assessment Note    Patient Details  Name: [name] MRN: [id number] Date of Birth: [birth year]  Transition of Care [facility]) CM/SW Contact:    Zenon Mayo, RN Phone Number: 11/20/2019, 5:12 PM  Clinical Narrative:                 From home, her grand daughter lives with her and is there with her for 24 hrs,  She has transportation home and she has no issues with getting her meds.  She has PCP.  NCM offered choice for HHPT, HHOT, SW, she states she does not have a preference.  NCM made referral to [facility] with Nanine Means , he state he will call me tomorrow.    Expected Discharge Plan: [place] Barriers to Discharge: Continued Medical Work up   Patient Goals and CMS Choice Patient states their goals for this hospitalization and ongoing recovery are:: get better, be able to move around like she did before CMS Medicare.gov Compare Post Acute Care list provided to:: Patient Represenative (must comment) Choice offered to / list presented to : Adult Children  Expected Discharge Plan and Services Expected Discharge Plan: [place] In-house Referral: NA Discharge Planning Services: CM Consult Post Acute Care Choice: [place] arrangements for the past 2 months: Single Family Home                   DME Agency: NA       HH Arranged: PT HH Agency: [facility] Date [place]: 11/20/19 Time [place]: 65 Representative spoke with at [place]: [place] Arrangements/Services Living arrangements for the past 2 months: [place] with:: Adult Children Patient language and need for interpreter reviewed:: Yes Do you feel safe going back to the place where you live?: Yes      Need for Family Participation in Patient Care: Yes (Comment) Care giver support system in place?: Yes (comment) Current home services: DME, Home RN (has w/chair, BSC, walker,  shower bench, has a Therapist, sports with [employer] to come out once a week to check on her) Criminal Activity/Legal Involvement Pertinent to Current Situation/Hospitalization: No - Comment as needed  Activities of Daily Living Home Assistive Devices/Equipment: Environmental consultant (specify type), Eyeglasses, Bedside commode/3-in-1, Wheelchair, Scales, CBG Meter, Shower chair with back, Oxygen, Grab bars in shower, Hand-held shower hose ADL Screening (condition at time of admission) Patient's cognitive ability adequate to safely complete daily activities?: Yes Is the patient deaf or have difficulty hearing?: No Does the patient have difficulty seeing, even when wearing glasses/contacts?: No Does the patient have difficulty concentrating, remembering, or making decisions?: No Patient able to express need for assistance with ADLs?: Yes Does the patient have difficulty dressing or bathing?: Yes Independently performs ADLs?: No Communication: Needs assistance Is this a change from baseline?: Pre-admission baseline Dressing (OT): Needs assistance Is this a change from baseline?: Pre-admission baseline Grooming: Needs assistance Is this a change from baseline?: Pre-admission baseline Feeding: Independent Bathing: Needs assistance Is this a change from baseline?: Pre-admission baseline Toileting: Needs assistance Is this a change from baseline?: Pre-admission baseline In/Out Bed: Needs assistance Is this a change from baseline?: Pre-admission baseline Walks in Home: Needs assistance Is this a change from baseline?: Pre-admission baseline Does the patient have difficulty walking or climbing stairs?: Yes (unable climb stairs) Weakness of Legs: Right (recent stress fracture right ankle) Weakness of Arms/Hands: None  Permission Sought/Granted  Emotional Assessment Appearance:: Appears stated age Attitude/Demeanor/Rapport: Engaged Affect (typically observed): Appropriate Orientation: : Oriented to  Self, Oriented to Place, Oriented to  Time, Oriented to Situation Alcohol / Substance Use: Not Applicable Psych Involvement: No (comment)  Admission diagnosis:  CHF (congestive heart failure) (HCC) [I50.9] Renal dysfunction [N28.9] Acute on chronic systolic congestive heart failure ([place]) [I50.23] Patient Active Problem List   Diagnosis Date Noted  . Acute renal failure superimposed on stage 4 chronic kidney disease ([place])   . Chronic a-fib ([place])   . Acidosis   . DM (diabetes mellitus), type 2 with complications ([place])   . CHF (congestive heart failure) ([place]) 11/18/2019  . Renal dysfunction   . Postoperative anemia due to acute blood loss 11/23/2018  . Closed left hip fracture, initial encounter ([place]) 11/20/2018  . Acute and chronic respiratory failure (acute-on-chronic) ([place]) 12/23/2017  . CKD (chronic kidney disease), stage V ([place]) 12/23/2017  . Essential hypertension   . HLD (hyperlipidemia)   . PAF (paroxysmal atrial fibrillation) ([place])   . Cardiomyopathy ([place])   . Mitral regurgitation 08/24/2015  . Congestive dilated cardiomyopathy ([place]) 08/24/2015  . Congestive heart failure (CHF) ([place]) 08/24/2015  . Acute on chronic congestive heart failure ([place])   . Dyspnea 08/02/2015  . AKI (acute kidney injury) ([place]) 05/10/2015  .  Rib fractures - right #8-10 05/09/2015  . Atrial fibrillation ([place]) 05/09/2015  . Scapula fracture 05/09/2015  . Fall 05/09/2015  . Essential hypertension, benign 05/09/2015  . Diabetes mellitus without complication ([place]) 05/13/2445  . History of breast cancer 05/09/2015   PCP:  Bernerd Limbo, MD Pharmacy:   [id number] [place], [place] [address] Phone: [phone] Fax: [phone]     Social Determinants of Health (SDOH) Interventions    Readmission Risk Interventions Readmission Risk Prevention Plan 11/20/2019 11/20/2019  Transportation Screening Complete Complete  PCP or Specialist Appt  within 3-5 Days Complete -  HRI or Home Care Consult Complete Patient refused  Social Work Consult for [place] Planning/Counseling Complete Complete  Palliative Care Screening Complete Not Applicable  Medication Review Press photographer) Complete Complete  Some recent data might be hidden

## 2019-11-20 NOTE — Progress Notes (Signed)
PROGRESS NOTE    [name]  LEX:[id number] DOB: Jul 24, 1925 DOA: 11/18/2019 PCP: Bernerd Limbo, MD    No chief complaint on file.   Brief Narrative:  HPI per Dr. [name] is a 86 y.o. female with medical history significant of systolic CHF, hypertension, CKD stage IV, IIDM, Chronic a-fib on Eliquis, resented with increasing short of breath and weight gaining.  Patient been feeling short of breath started about 3 days ago, increasingly getting worse.  Patient had a recent stress fracture on right ankle was treated conservatively with orthopedic surgery, and she has not been as active as before the fracture.  She says mostly the pain is controlled, she denies any chest pain no cough no fever or chills. ED Course: Chest x-ray showed cardiomegaly and pulmonary congestion, blood work showed elevation of creatinine level from 2.7-3.2 today BUN 109.    Assessment & Plan:   Active Problems:   CHF (congestive heart failure) (HCC)   Acute renal failure superimposed on stage 4 chronic kidney disease (HCC)   Chronic a-fib (HCC)   Acidosis   DM (diabetes mellitus), type 2 with complications (HCC)  1 acute on chronic systolic heart failure Patient presented with worsening shortness of breath, increased weight gain, chest x-ray concerning for cardiomegaly and pulmonary congestion.  Patient noted to have been volume overloaded on presentation.  BNP elevated at 781.6 on admission.  Troponins slightly elevated but seem to be plateaued.  Patient denied any ongoing chest pain.  Patient with some expiratory wheezing, still volume overloaded on examination.  2D echo done with a EF of 40 to 45%, left ventricle with global hypokinesis, right ventricular systolic function mildly reduced, right ventricular size mildly enlarged, moderate elevated pulmonary artery systolic pressure with right ventricular systolic pressure 44.9 mmHg.  Left atrial size severely dilated, right atrial size severely  dilated, no evidence of mitral stenosis, moderate to severe tricuspid valve regurgitation, moderate to severe dilatation of the ascending aorta measuring 46 mm.  Patient received a dose of IV Lasix in the ED.  Currently on IV Bumex.  Urine output of 925 cc over the past 24 hours.  Current weight of 101.63 pounds from 108.91 pounds from 136 pounds.  Patient with some wheezing concern for cardiac etiology.  Repeat BNP.  Check a chest x-ray.  Placed on scheduled nebulizer treatments.  Continue IV diuresis, Coreg, hydralazine, Imdur.  Cardiology following and appreciate input and recommendations.   2.  Acute kidney injury on chronic kidney disease stage IV Likely secondary to problem #1/cardiorenal syndrome.  Baseline creatinine approximately 2.6.  Per admitting physician patient is a DNR and has declined HD in the past.  Patient on IV diuretics of Bumex with some clinical improvement however still volume overloaded on examination.  Urine output of 925 cc over the past 24 hours.  Creatinine fluctuating currently at 3.12 from 2.99 from 3.27 on admission.  Continue diuresis with IV Bumex.  Avoid nephrotoxic agents.  Outpatient follow-up with nephrology.  Follow.  3. hypertension Controlled.  Continue IV Bumex, Coreg, hydralazine, Imdur.   4.  Acidosis Secondary to problem #2.  Bicarb tablets.    5.  Diabetes mellitus type 2 Hemoglobin A1c 6.8.  CBG of 108.  Continue to hold saxagliptin.  Sliding scale insulin.   6.  Chronic A. fib Cardizem discontinued due to low EF.  Continue Coreg for rate control.  Eliquis for anticoagulation.  15.  Cardiomyopathy Being followed by cardiology.  Per cardiology patient with a broad  left bundle branch block and may benefit from CRT however patient has declined further evaluation and does not want aggressive testing or other measures only medical therapy at this time.  Continue Coreg, hydralazine, Imdur.  No ARB or Entresto secondary to renal insufficiency.  Per cardiology  2D echo with EF of 40 to 45% similar to prior 2D echo.   DVT prophylaxis: Eliquis Code Status: DNR Family Communication: Updated patient.  No family at bedside.  Disposition:   Status is: Inpatient    Dispo: [name] from: Home              Anticipated d/c is to: SNF              Anticipated d/c date is: 2 to 3 days.              Patient currently in acute CHF exacerbation currently on IV diuretics, cardiology following.  Patient with acute on chronic kidney disease.  Patient not medically stable for discharge.       Consultants:   Cardiology: Dr. [name] 11/19/2019  Procedures:   2D echo 11/19/2019  Chest x-ray 11/18/2019, 11/19/2019, 11/20/2019    Antimicrobials:  None   Subjective: Patient sitting up in bed.  States she is feeling better.  Denies shortness of breath however looks clinically short of breath.  No chest pain.  States she is feeling better than on admission.    Objective: Vitals:   11/20/19 0545 11/20/19 0548 11/20/19 0549 11/20/19 0848  BP:  100/62 (!) 147/71 (!) 149/79  Pulse:  92 79   Resp:  20  14  Temp:  97.7 F (36.5 C)    TempSrc:  Oral    SpO2:  97% 96%   Weight: 46.1 kg     Height:        Intake/Output Summary (Last 24 hours) at 11/20/2019 0936 Last data filed at 11/20/2019 0548 Gross per 24 hour  Intake 660 ml  Output 726 ml  Net -66 ml   Filed Weights   11/18/19 1243 11/19/19 0317 11/20/19 0545  Weight: 61.7 kg 49.4 kg 46.1 kg    Examination:  General exam: NAD Respiratory system: Bibasilar crackles.  Minimal expiratory wheezing.  No rhonchi.  Speaking in full sentences.  Cardiovascular system: Irregularly irregular.  Positive JVD.  No murmurs rubs or gallops.  Trace bilateral lower extremity edema.   Gastrointestinal system: Abdomen is soft, nontender, nondistended, positive bowel sounds.  No rebound.  No guarding Central nervous system: Alert and oriented. No focal neurological deficits. Extremities: Symmetric 5  x 5 power. Skin: No rashes, lesions or ulcers Psychiatry: Judgement and insight appear normal. Mood & affect appropriate.     Data Reviewed: I have personally reviewed following labs and imaging studies  CBC: Recent Labs  Lab 11/18/19 1245 11/20/19 0416  WBC 9.8 7.3  HGB 11.5* 10.4*  HCT 34.9* 31.8*  MCV 92.3 92.2  PLT 286 111    Basic Metabolic Panel: Recent Labs  Lab 11/18/19 1245 11/19/19 0006 11/20/19 0416  NA 142 140 144  K 4.5 3.7 3.4*  CL 111 108 110  CO2 15* 17* 17*  GLUCOSE 176* 154* 110*  BUN 109* 107* 109*  CREATININE 3.27* 2.99* 3.12*  CALCIUM 9.8 9.7 9.5  MG  --  2.6*  --   PHOS  --  5.2*  --     GFR: Estimated Creatinine Clearance: 8.5 mL/min (A) (by C-G formula based on SCr of 3.12 mg/dL (H)).  Liver Function Tests: No results for input(s): AST, ALT, ALKPHOS, BILITOT, PROT, ALBUMIN in the last 168 hours.  CBG: Recent Labs  Lab 11/19/19 0620 11/19/19 1208 11/19/19 1636 11/19/19 2144 11/20/19 0619  GLUCAP 135* 134* 126* 141* 108*     Recent Results (from the past 240 hour(s))  SARS Coronavirus 2 by RT PCR (hospital order, performed in [facility] hospital lab) Nasopharyngeal Nasopharyngeal Swab     Status: None   Collection Time: 11/19/19  8:00 AM   Specimen: Nasopharyngeal Swab  Result Value Ref Range Status   SARS Coronavirus 2 NEGATIVE NEGATIVE Final    Comment: (NOTE) SARS-CoV-2 target nucleic acids are NOT DETECTED.  The SARS-CoV-2 RNA is generally detectable in upper and lower respiratory specimens during the acute phase of infection. The lowest concentration of SARS-CoV-2 viral copies this assay can detect is 250 copies / mL. A negative result does not preclude SARS-CoV-2 infection and should not be used as the sole basis for treatment or other patient management decisions.  A negative result may occur with improper specimen collection / handling, submission of specimen other than nasopharyngeal swab, presence of viral  mutation(s) within the areas targeted by this assay, and inadequate number of viral copies (<250 copies / mL). A negative result must be combined with clinical observations, patient history, and epidemiological information.  Fact Sheet for Patients:   StrictlyIdeas.no  Fact Sheet for Healthcare Providers: BankingDealers.co.za  This test is not yet approved or  cleared by the [place] FDA and has been authorized for detection and/or diagnosis of SARS-CoV-2 by FDA under an Emergency Use Authorization (EUA).  This EUA will remain in effect (meaning this test can be used) for the duration of the COVID-19 declaration under Section 564(b)(1) of the Act, 21 U.S.C. section 360bbb-3(b)(1), unless the authorization is terminated or revoked sooner.  Performed at [facility], [place] [address], [place], [place] [address]          Radiology Studies: DG Chest 2 View  Result Date: 11/19/2019 CLINICAL DATA:  Shortness of breath EXAM: CHEST - 2 VIEW COMPARISON:  11/18/2019 FINDINGS: Cardiac shadow remains enlarged. Aortic calcifications are again seen. Prominent central vascularity is noted with mild interstitial edema stable from the previous exam. Left-sided pleural effusion is noted and stable. No new focal abnormality is seen. Old rib fractures and chronic shoulder degenerative changes are seen. IMPRESSION: Stable appearance of the chest with mild changes of CHF. Electronically Signed   By: Inez Catalina M.D.   On: 11/19/2019 09:49   DG Chest 2 View  Result Date: 11/18/2019 CLINICAL DATA:  86 year old female with history of shortness of breath. EXAM: CHEST - 2 VIEW COMPARISON:  Chest x-ray 11/20/2018. FINDINGS: Lung volumes are low. Opacity at the left base which may reflect atelectasis and/or consolidation. Right lung is clear. No definite pleural effusions. Cephalization of the pulmonary vasculature, without frank pulmonary edema. No  pneumothorax. Moderate cardiomegaly. Upper mediastinal contours are distorted by patient's position. Aortic atherosclerosis. Multiple old healed left-sided rib fractures. Advanced degenerative changes in the glenohumeral joints bilaterally (left greater than right). IMPRESSION: 1. Low lung volumes with atelectasis and/or consolidation in the left lower lobe. 2. Moderate cardiomegaly with pulmonary venous congestion, but no frank pulmonary edema. 3. Aortic atherosclerosis. Electronically Signed   By: Vinnie Langton M.D.   On: 11/18/2019 13:47   ECHOCARDIOGRAM COMPLETE  Result Date: 11/19/2019    ECHOCARDIOGRAM REPORT   Patient Name:   [name] Date of Exam: 11/19/2019 Medical  Rec #:  [id number]     Height:       62.0 in Accession #:    [id number]    Weight:       108.9 lb Date of Birth:  Nov 02, 1925    BSA:          1.476 m Patient Age:    63 years      BP:           142/77 mmHg Patient Gender: F             HR:           86 bpm. Exam Location:  Inpatient Procedure: 2D Echo and 3D Echo Indications:    CHF-Acute Systolic 017.79 / T90.30  History:        Patient has prior history of Echocardiogram examinations, most                 recent 08/03/2015. Arrythmias:Atrial Fibrillation; Risk                 Factors:Hypertension, Dyslipidemia and Diabetes. Chronic kidney                 disease, past history of breast cancer, cardiomyopathy.  Sonographer:    Darlina Sicilian RDCS Referring Phys: [id number] [facility]  1. Left ventricular ejection fraction, by estimation, is 40 to 45%. The left ventricle has mildly decreased function. The left ventricle demonstrates global hypokinesis. Left ventricular diastolic function could not be evaluated.  2. Right ventricular systolic function is mildly reduced. The right ventricular size is mildly enlarged. There is moderately elevated pulmonary artery systolic pressure. The estimated right ventricular systolic pressure is 76.2 mmHg.  3. Left atrial size was severely  dilated.  4. Right atrial size was severely dilated.  5. The mitral valve is normal in structure. Mild mitral valve regurgitation. No evidence of mitral stenosis.  6. Tricuspid valve regurgitation is moderate to severe.  7. The aortic valve is normal in structure. Aortic valve regurgitation is moderate. No aortic stenosis is present.  8. Aortic dilatation noted. There is moderate to severe dilatation of the ascending aorta measuring 46 mm. FINDINGS  Left Ventricle: Left ventricular ejection fraction, by estimation, is 40 to 45%. The left ventricle has mildly decreased function. The left ventricle demonstrates global hypokinesis. The left ventricular internal cavity size was normal in size. There is  no left ventricular hypertrophy. Left ventricular diastolic function could not be evaluated due to atrial fibrillation. Left ventricular diastolic function could not be evaluated. The ratio of pulmonic flow to systemic flow (Qp/Qs ratio) is 0.60. Right Ventricle: The right ventricular size is mildly enlarged. No increase in right ventricular wall thickness. Right ventricular systolic function is mildly reduced. There is moderately elevated pulmonary artery systolic pressure. The tricuspid regurgitant velocity is 3.52 m/s, and with an assumed right atrial pressure of 8 mmHg, the estimated right ventricular systolic pressure is 26.3 mmHg. Left Atrium: Left atrial size was severely dilated. Right Atrium: Right atrial size was severely dilated. Pericardium: There is no evidence of pericardial effusion. Mitral Valve: The mitral valve is normal in structure. Mild mitral valve regurgitation. No evidence of mitral valve stenosis. Tricuspid Valve: The tricuspid valve is normal in structure. Tricuspid valve regurgitation is moderate to severe. No evidence of tricuspid stenosis. Aortic Valve: The aortic valve is normal in structure. Aortic valve regurgitation is moderate. No aortic stenosis is present. Pulmonic Valve: The pulmonic  valve was grossly normal. Pulmonic valve regurgitation  is mild. No evidence of pulmonic stenosis. Aorta: Aortic dilatation noted. There is moderate to severe dilatation of the ascending aorta measuring 46 mm. IAS/Shunts: The atrial septum is grossly normal. The ratio of pulmonic flow to systemic flow (Qp/Qs ratio) is 0.60.  LEFT VENTRICLE PLAX 2D LVIDd:         5.20 cm LVIDs:         4.00 cm LV PW:         0.90 cm LV IVS:        1.00 cm LVOT diam:     2.20 cm LV SV:         57 LV SV Index:   38 LVOT Area:     3.80 cm  LV Volumes (MOD) LV vol d, MOD A2C: 155.0 ml LV vol d, MOD A4C: 93.0 ml LV vol s, MOD A2C: 83.5 ml LV vol s, MOD A4C: 57.0 ml LV SV MOD A2C:     71.5 ml LV SV MOD A4C:     93.0 ml LV SV MOD BP:      55.4 ml RIGHT VENTRICLE RVOT diam:      2.10 cm LEFT ATRIUM             Index       RIGHT ATRIUM           Index LA diam:        3.80 cm 2.57 cm/m  RA Area:     30.00 cm LA Vol (A2C):   92.1 ml 62.38 ml/m RA Volume:   105.00 ml 71.11 ml/m LA Vol (A4C):   74.0 ml 50.12 ml/m LA Biplane Vol: 84.7 ml 57.37 ml/m  AORTIC VALVE             PULMONIC VALVE LVOT Vmax:   98.80 cm/s  RVOT Peak grad: 2 mmHg LVOT Vmean:  64.300 cm/s LVOT VTI:    0.149 m  AORTA Ao Root diam: 4.10 cm Ao Asc diam:  4.60 cm MITRAL VALVE               TRICUSPID VALVE MV Area (PHT): 6.00 cm    TR Peak grad:   49.6 mmHg MV Decel Time: 127 msec    TR Vmax:        352.00 cm/s MV E velocity: 95.30 cm/s                            SHUNTS                            Systemic VTI:  0.15 m                            Systemic Diam: 2.20 cm                            Pulmonic VTI:  0.096 m                            Pulmonic Diam: 2.10 cm                            Qp/Qs:         0.58 Mertie Moores MD Electronically signed by Mertie Moores MD  Signature Date/Time: 11/19/2019/1:34:03 PM    Final         Scheduled Meds:  apixaban  2.5 mg Oral BID   bumetanide (BUMEX) IV  0.5 mg Intravenous Q12H   carvedilol  6.25 mg Oral BID WC    ferrous sulfate  325 mg Oral BID WC   hydrALAZINE  25 mg Oral TID   insulin aspart  0-9 Units Subcutaneous TID WC   ipratropium  0.5 mg Nebulization Q8H   isosorbide mononitrate  30 mg Oral Daily   levalbuterol  0.63 mg Nebulization Q8H   LORazepam  0.5 mg Oral QHS   polyethylene glycol  17 g Oral Daily   pregabalin  50 mg Oral Daily   sodium bicarbonate  650 mg Oral TID   sodium chloride  1 drop Left Eye QID   sodium chloride flush  3 mL Intravenous Once   sodium chloride flush  3 mL Intravenous Q12H   Continuous Infusions:  sodium chloride       LOS: 2 days    Time spent: 40 minutes    Irine Seal, MD [facility]   To contact the attending provider between 7A-7P or the covering provider during after hours 7P-7A, please log into the web site [email or web address] and access using universal [place] password for that web site. If you do not have the password, please call the hospital operator.  11/20/2019, 9:36 AM

## 2019-11-20 NOTE — Progress Notes (Signed)
Progress Note  Patient Name: [name] Date of Encounter: 11/20/2019  [facility] Cardiologist: Kirk Ruths, MD   Subjective   She reports breathing is now normal, but she is still speaking in interrupted sentences and sitting almost straight up.  Inpatient Medications    Scheduled Meds: . apixaban  2.5 mg Oral BID  . bumetanide (BUMEX) IV  0.5 mg Intravenous Q12H  . carvedilol  6.25 mg Oral BID WC  . ferrous sulfate  325 mg Oral BID WC  . hydrALAZINE  25 mg Oral TID  . insulin aspart  0-9 Units Subcutaneous TID WC  . ipratropium  0.5 mg Nebulization Q8H  . isosorbide mononitrate  30 mg Oral Daily  . levalbuterol  0.63 mg Nebulization Q8H  . LORazepam  0.5 mg Oral QHS  . polyethylene glycol  17 g Oral Daily  . pregabalin  50 mg Oral Daily  . sodium bicarbonate  650 mg Oral TID  . sodium chloride  1 drop Left Eye QID  . sodium chloride flush  3 mL Intravenous Once  . sodium chloride flush  3 mL Intravenous Q12H   Continuous Infusions: . sodium chloride     PRN Meds: sodium chloride, acetaminophen, metoprolol tartrate, ondansetron (ZOFRAN) IV, sodium chloride, sodium chloride flush   Vital Signs    Vitals:   11/20/19 0545 11/20/19 0548 11/20/19 0549 11/20/19 0848  BP:  100/62 (!) 147/71 (!) 149/79  Pulse:  92 79   Resp:  20  14  Temp:  97.7 F (36.5 C)    TempSrc:  Oral    SpO2:  97% 96%   Weight: 46.1 kg     Height:        Intake/Output Summary (Last 24 hours) at 11/20/2019 1014 Last data filed at 11/20/2019 0548 Gross per 24 hour  Intake 660 ml  Output 726 ml  Net -66 ml   Last 3 Weights 11/20/2019 11/19/2019 11/18/2019  Weight (lbs) 101 lb 10.1 oz 108 lb 14.5 oz 136 lb  Weight (kg) 46.1 kg 49.4 kg 61.689 kg      Telemetry    AF rate controlled - Personally Reviewed  ECG    No new tracing - Personally Reviewed  Physical Exam  Smiling GEN: No acute distress.   Neck: 9-10 cm JVD Cardiac: irregular, paradoxically split S2, systolic  murmurs at the apex, LLSB and RUSB, no rubs or gallops.  Respiratory: Clear to auscultation bilaterally. GI: Soft, nontender, non-distended  MS: No edema; No deformity. Neuro:  Nonfocal  Psych: Normal affect   Labs    High Sensitivity Troponin:   Recent Labs  Lab 11/18/19 1245 11/18/19 1432  TROPONINIHS 46* 50*      Chemistry Recent Labs  Lab 11/18/19 1245 11/19/19 0006 11/20/19 0416  NA 142 140 144  K 4.5 3.7 3.4*  CL 111 108 110  CO2 15* 17* 17*  GLUCOSE 176* 154* 110*  BUN 109* 107* 109*  CREATININE 3.27* 2.99* 3.12*  CALCIUM 9.8 9.7 9.5  GFRNONAA 12* 13* 12*  GFRAA 14* 15* 14*  ANIONGAP 16* 15 17*     Hematology Recent Labs  Lab 11/18/19 1245 11/20/19 0416  WBC 9.8 7.3  RBC 3.78* 3.45*  HGB 11.5* 10.4*  HCT 34.9* 31.8*  MCV 92.3 92.2  MCH 30.4 30.1  MCHC 33.0 32.7  RDW 15.2 15.1  PLT 286 235    BNP Recent Labs  Lab 11/18/19 1245  BNP 781.6*     DDimer No results  for input(s): DDIMER in the last 168 hours.   Radiology    DG Chest 2 View  Result Date: 11/19/2019 CLINICAL DATA:  Shortness of breath EXAM: CHEST - 2 VIEW COMPARISON:  11/18/2019 FINDINGS: Cardiac shadow remains enlarged. Aortic calcifications are again seen. Prominent central vascularity is noted with mild interstitial edema stable from the previous exam. Left-sided pleural effusion is noted and stable. No new focal abnormality is seen. Old rib fractures and chronic shoulder degenerative changes are seen. IMPRESSION: Stable appearance of the chest with mild changes of CHF. Electronically Signed   By: Inez Catalina M.D.   On: 11/19/2019 09:49   DG Chest 2 View  Result Date: 11/18/2019 CLINICAL DATA:  86 year old female with history of shortness of breath. EXAM: CHEST - 2 VIEW COMPARISON:  Chest x-ray 11/20/2018. FINDINGS: Lung volumes are low. Opacity at the left base which may reflect atelectasis and/or consolidation. Right lung is clear. No definite pleural effusions. Cephalization  of the pulmonary vasculature, without frank pulmonary edema. No pneumothorax. Moderate cardiomegaly. Upper mediastinal contours are distorted by patient's position. Aortic atherosclerosis. Multiple old healed left-sided rib fractures. Advanced degenerative changes in the glenohumeral joints bilaterally (left greater than right). IMPRESSION: 1. Low lung volumes with atelectasis and/or consolidation in the left lower lobe. 2. Moderate cardiomegaly with pulmonary venous congestion, but no frank pulmonary edema. 3. Aortic atherosclerosis. Electronically Signed   By: Vinnie Langton M.D.   On: 11/18/2019 13:47   ECHOCARDIOGRAM COMPLETE  Result Date: 11/19/2019    ECHOCARDIOGRAM REPORT   Patient Name:   [name] Date of Exam: 11/19/2019 Medical Rec #:  [id number]     Height:       62.0 in Accession #:    [id number]    Weight:       108.9 lb Date of Birth:  Dec 27, 1925    BSA:          1.476 m Patient Age:    86 years      BP:           142/77 mmHg Patient Gender: F             HR:           86 bpm. Exam Location:  Inpatient Procedure: 2D Echo and 3D Echo Indications:    CHF-Acute Systolic 793.90 / Z00.92  History:        Patient has prior history of Echocardiogram examinations, most                 recent 08/03/2015. Arrythmias:Atrial Fibrillation; Risk                 Factors:Hypertension, Dyslipidemia and Diabetes. Chronic kidney                 disease, past history of breast cancer, cardiomyopathy.  Sonographer:    Darlina Sicilian RDCS Referring Phys: [id number] [place]  1. Left ventricular ejection fraction, by estimation, is 40 to 45%. The left ventricle has mildly decreased function. The left ventricle demonstrates global hypokinesis. Left ventricular diastolic function could not be evaluated.  2. Right ventricular systolic function is mildly reduced. The right ventricular size is mildly enlarged. There is moderately elevated pulmonary artery systolic pressure. The estimated right ventricular  systolic pressure is 26.3 mmHg.  3. Left atrial size was severely dilated.  4. Right atrial size was severely dilated.  5. The mitral valve is normal in structure. Mild mitral valve regurgitation. No evidence of mitral stenosis.  6. Tricuspid valve regurgitation is moderate to severe.  7. The aortic valve is normal in structure. Aortic valve regurgitation is moderate. No aortic stenosis is present.  8. Aortic dilatation noted. There is moderate to severe dilatation of the ascending aorta measuring 46 mm. FINDINGS  Left Ventricle: Left ventricular ejection fraction, by estimation, is 40 to 45%. The left ventricle has mildly decreased function. The left ventricle demonstrates global hypokinesis. The left ventricular internal cavity size was normal in size. There is  no left ventricular hypertrophy. Left ventricular diastolic function could not be evaluated due to atrial fibrillation. Left ventricular diastolic function could not be evaluated. The ratio of pulmonic flow to systemic flow (Qp/Qs ratio) is 0.60. Right Ventricle: The right ventricular size is mildly enlarged. No increase in right ventricular wall thickness. Right ventricular systolic function is mildly reduced. There is moderately elevated pulmonary artery systolic pressure. The tricuspid regurgitant velocity is 3.52 m/s, and with an assumed right atrial pressure of 8 mmHg, the estimated right ventricular systolic pressure is 09.6 mmHg. Left Atrium: Left atrial size was severely dilated. Right Atrium: Right atrial size was severely dilated. Pericardium: There is no evidence of pericardial effusion. Mitral Valve: The mitral valve is normal in structure. Mild mitral valve regurgitation. No evidence of mitral valve stenosis. Tricuspid Valve: The tricuspid valve is normal in structure. Tricuspid valve regurgitation is moderate to severe. No evidence of tricuspid stenosis. Aortic Valve: The aortic valve is normal in structure. Aortic valve regurgitation is  moderate. No aortic stenosis is present. Pulmonic Valve: The pulmonic valve was grossly normal. Pulmonic valve regurgitation is mild. No evidence of pulmonic stenosis. Aorta: Aortic dilatation noted. There is moderate to severe dilatation of the ascending aorta measuring 46 mm. IAS/Shunts: The atrial septum is grossly normal. The ratio of pulmonic flow to systemic flow (Qp/Qs ratio) is 0.60.  LEFT VENTRICLE PLAX 2D LVIDd:         5.20 cm LVIDs:         4.00 cm LV PW:         0.90 cm LV IVS:        1.00 cm LVOT diam:     2.20 cm LV SV:         57 LV SV Index:   38 LVOT Area:     3.80 cm  LV Volumes (MOD) LV vol d, MOD A2C: 155.0 ml LV vol d, MOD A4C: 93.0 ml LV vol s, MOD A2C: 83.5 ml LV vol s, MOD A4C: 57.0 ml LV SV MOD A2C:     71.5 ml LV SV MOD A4C:     93.0 ml LV SV MOD BP:      55.4 ml RIGHT VENTRICLE RVOT diam:      2.10 cm LEFT ATRIUM             Index       RIGHT ATRIUM           Index LA diam:        3.80 cm 2.57 cm/m  RA Area:     30.00 cm LA Vol (A2C):   92.1 ml 62.38 ml/m RA Volume:   105.00 ml 71.11 ml/m LA Vol (A4C):   74.0 ml 50.12 ml/m LA Biplane Vol: 84.7 ml 57.37 ml/m  AORTIC VALVE             PULMONIC VALVE LVOT Vmax:   98.80 cm/s  RVOT Peak grad: 2 mmHg LVOT Vmean:  64.300 cm/s LVOT VTI:  0.149 m  AORTA Ao Root diam: 4.10 cm Ao Asc diam:  4.60 cm MITRAL VALVE               TRICUSPID VALVE MV Area (PHT): 6.00 cm    TR Peak grad:   49.6 mmHg MV Decel Time: 127 msec    TR Vmax:        352.00 cm/s MV E velocity: 95.30 cm/s                            SHUNTS                            Systemic VTI:  0.15 m                            Systemic Diam: 2.20 cm                            Pulmonic VTI:  0.096 m                            Pulmonic Diam: 2.10 cm                            Qp/Qs:         0.58 Mertie Moores MD Electronically signed by Mertie Moores MD Signature Date/Time: 11/19/2019/1:34:03 PM    Final     Cardiac Studies   Echo 07/2015 Study Conclusions   - Left ventricle: Wall  thickness was increased in a pattern of  moderate LVH. There was focal basal hypertrophy. Systolic  function was moderately reduced. The estimated ejection fraction  was in the range of 35% to 40%. Dyskinesis of the anteroseptal  myocardium.  - Aortic valve: There was moderate regurgitation.  - Mitral valve: There was severe regurgitation.  - Left atrium: The atrium was severely dilated.  - Right atrium: The atrium was mildly dilated.  - Tricuspid valve: There was mild-moderate regurgitation.  - Pulmonary arteries: Systolic pressure was mildly increased. PA  peak pressure: 33 mm Hg (S).   ECHO 11/19/2019  1. Left ventricular ejection fraction, by estimation, is 40 to 45%. The  left ventricle has mildly decreased function. The left ventricle  demonstrates global hypokinesis. Left ventricular diastolic function could  not be evaluated.  2. Right ventricular systolic function is mildly reduced. The right  ventricular size is mildly enlarged. There is moderately elevated  pulmonary artery systolic pressure. The estimated right ventricular  systolic pressure is 24.0 mmHg.  3. Left atrial size was severely dilated.  4. Right atrial size was severely dilated.  5. The mitral valve is normal in structure. Mild mitral valve  regurgitation. No evidence of mitral stenosis.  6. Tricuspid valve regurgitation is moderate to severe.  7. The aortic valve is normal in structure. Aortic valve regurgitation is  moderate. No aortic stenosis is present.  8. Aortic dilatation noted. There is moderate to severe dilatation of the  ascending aorta measuring 46 mm.  ...estimated right ventricular systolic pressure is  97.3 mmHg.   Patient Profile     86 y.o. female with a hx of permanent atrial fibrillation, congestive heart failure (EF 40%), mild MR, moderate AI, moderate -severe TR, CKD 4 admitted  with acute exacerbation of HF, trigger unclear.   Assessment & Plan    Acute on  chronic systolic CHF - she carefully avoids sodium in her diet and has been compliant w meds - valvular abnormalities are stable - afib is well rate controlled - wants conservative treatment - her true weight appears to have dropped considerably. Will try to shoot for a "dry weight" around 100 lb. Management harder due to CKD. - still has some signs of hypervolemia, one more day of IV diuretics  Permanent atrial fibrillation - diltiazem stopped due to low EF - carvedilol for rate control and CHF - Eliquis 2.5 mg twice daily for stroke prophylaxis  Cardiomyopathy - Has a very broad LBBB and may benefit from CRT, but she has previously declined further evaluation and does not want aggressive testing or other measures.  wants medical therapy -Continue beta-blocker, hydralazine 25 mg 3 times daily, Imdur 30 mg daily - No ARB or Entresto given baseline renal insufficiency - Echo with similar from prior with EF at 40-45%  AKI /CKD stage IV  -Creatinine baseline around 2.6.  Little change during this admission -Followed by nephrology as outpatient -Patient is DNR and has declined HD in the past      For questions or updates, please contact [place] Please consult [email or web address] for contact info under        Signed, Sanda Klein, MD  11/20/2019, 10:14 AM

## 2019-11-21 ENCOUNTER — Telehealth: Payer: Self-pay | Admitting: Cardiology

## 2019-11-21 DIAGNOSIS — I429 Cardiomyopathy, unspecified: Secondary | ICD-10-CM

## 2019-11-21 DIAGNOSIS — E872 Acidosis: Secondary | ICD-10-CM

## 2019-11-21 DIAGNOSIS — I10 Essential (primary) hypertension: Secondary | ICD-10-CM

## 2019-11-21 DIAGNOSIS — E118 Type 2 diabetes mellitus with unspecified complications: Secondary | ICD-10-CM

## 2019-11-21 DIAGNOSIS — I5042 Chronic combined systolic (congestive) and diastolic (congestive) heart failure: Secondary | ICD-10-CM

## 2019-11-21 LAB — BASIC METABOLIC PANEL
Anion gap: 17 — ABNORMAL HIGH (ref 5–15)
BUN: 105 mg/dL — ABNORMAL HIGH (ref 8–23)
CO2: 16 mmol/L — ABNORMAL LOW (ref 22–32)
Calcium: 9.3 mg/dL (ref 8.9–10.3)
Chloride: 108 mmol/L (ref 98–111)
Creatinine, Ser: 3.07 mg/dL — ABNORMAL HIGH (ref 0.44–1.00)
GFR calc Af Amer: 15 mL/min — ABNORMAL LOW (ref >60)
GFR calc non Af Amer: 13 mL/min — ABNORMAL LOW (ref >60)
Glucose, Bld: 118 mg/dL — ABNORMAL HIGH (ref 70–99)
Potassium: 3.9 mmol/L (ref 3.5–5.1)
Sodium: 141 mmol/L (ref 135–145)

## 2019-11-21 LAB — GLUCOSE, CAPILLARY
Glucose-Capillary: 110 mg/dL — ABNORMAL HIGH (ref 70–99)
Glucose-Capillary: 130 mg/dL — ABNORMAL HIGH (ref 70–99)

## 2019-11-21 LAB — MAGNESIUM: Magnesium: 2.3 mg/dL (ref 1.7–2.4)

## 2019-11-21 MED ORDER — TORSEMIDE 20 MG PO TABS: 40.0000 mg | ORAL_TABLET | Freq: Every day | ORAL | 1 refills | Status: AC

## 2019-11-21 MED ORDER — SODIUM BICARBONATE 650 MG PO TABS: 650.0000 mg | ORAL_TABLET | Freq: Three times a day (TID) | ORAL | 1 refills | Status: AC

## 2019-11-21 MED ORDER — HYDRALAZINE HCL 25 MG PO TABS: 25.0000 mg | ORAL_TABLET | Freq: Three times a day (TID) | ORAL | 1 refills | Status: AC

## 2019-11-21 NOTE — Discharge Summary (Signed)
Physician Discharge Summary  [name] BOF:[id number] DOB: 1926-05-29 DOA: 11/18/2019  PCP: Kendra Limbo, MD  Admit date: 11/18/2019 Discharge date: 11/21/2019  Time spent: 55 minutes  Recommendations for Outpatient Follow-up:  1. Follow-up with Kendra Limbo, MD in 2 weeks.  On follow-up patient need a basic metabolic profile done to follow-up on electrolytes and renal function. 2. Follow-up at [facility] visit with APP, cardiology in 1 to 2 weeks. 3. Follow-up with Dr. Stanford Breed, cardiology in 2 to 3 months.   Discharge Diagnoses:  Active Problems:   CHF (congestive heart failure) (HCC)   Acute renal failure superimposed on stage 4 chronic kidney disease (HCC)   Chronic a-fib (HCC)   Acidosis   DM (diabetes mellitus), type 2 with complications [facility])   Discharge Condition: Stable and improved  Diet recommendation: Heart healthy  Filed Weights   11/19/19 0317 11/20/19 0545 11/21/19 0552  Weight: 49.4 kg 46.1 kg 42.8 kg    History of present illness:  HPI per Dr. Conley [name] is a 86 y.o. female with medical history significant of systolic CHF, hypertension, CKD stage IV, IIDM, Chronic a-fib on Eliquis, resented with increasing short of breath and weight gaining.  Patient had been feeling short of breath started about 3 days ago, increasingly getting worse.  Patient had a recent stress fracture on right ankle was treated conservatively with orthopedic surgery, and she has not been as active as before the fracture.  She says mostly the pain is controlled, she denies any chest pain no cough no fever or chills. ED Course: Chest x-ray showed cardiomegaly and pulmonary congestion, blood work showed elevation of creatinine level from 2.7-3.2 today BUN 109.  Hospital Course:  1 acute on chronic systolic heart failure Patient presented with worsening shortness of breath, increased weight gain, chest x-ray concerning for cardiomegaly and pulmonary congestion.  Patient noted to have  been volume overloaded on presentation.  BNP elevated at 781.6 on admission.  Troponins slightly elevated but seem to be plateaued.  Patient denied any ongoing chest pain.  Patient with some expiratory wheezing, still volume overloaded on examination.  2D echo done with a EF of 40 to 45%, left ventricle with global hypokinesis, right ventricular systolic function mildly reduced, right ventricular size mildly enlarged, moderate elevated pulmonary artery systolic pressure with right ventricular systolic pressure 77.8 mmHg.  Left atrial size severely dilated, right atrial size severely dilated, no evidence of mitral stenosis, moderate to severe tricuspid valve regurgitation, moderate to severe dilatation of the ascending aorta measuring 46 mm.  Patient received a dose of IV Lasix in the ED.   patient was maintained on IV Bumex during the hospitalization with good urine output.  Patient's weight was down to 94.36 pounds on day of discharge from 101.63 pounds from 108.91 pounds from 136 pounds.  Patient noted to have some wheezing and was placed on some scheduled nebs and diuresis with improvement.  Patient also maintained on Coreg, hydralazine, Imdur.  Patient was followed by cardiology throughout the hospitalization and will follow up closely cardiology in the outpatient setting.  2.  Acute kidney injury on chronic kidney disease stage IV Likely secondary to problem #1/cardiorenal syndrome.  Baseline creatinine approximately 2.6.  Per admitting physician patient is a DNR and has declined HD in the past.  Patient on IV diuretics of Bumex with some clinical improvement however still volume overloaded on examination.    Renal output during the hospitalization.  Creatinine fluctuated and was down to 3.07 by day  of discharge from 3.12 from 2.99 from 3.27 on admission.  Likely patient's new baseline.  Patient be discharged home on oral torsemide 40 mg daily as recommended per cardiology.  Outpatient follow-up with PCP  and nephrologist.  3. hypertension Remained controlled on Coreg, hydralazine, Imdur and IV Bumex.  Outpatient follow-up.    4.  Acidosis Secondary to problem #2.    Patient maintained on bicarb tablets.  Patient be discharged on bicarb tablets.  Outpatient follow-up with PCP.   5.  Diabetes mellitus type 2 Hemoglobin A1c 6.8.  CBG of 108.    Patient's saxagliptin was held during the hospitalization and patient maintained on sliding scale insulin.  Outpatient follow-up with PCP.  6.  Chronic A. fib Cardizem discontinued due to low EF.    Patient maintained on Coreg for rate control.  Eliquis for anticoagulation.  Outpatient follow-up.   21.  Cardiomyopathy Being followed by cardiology.  Per cardiology patient with a broad left bundle branch block and may benefit from CRT however patient has declined further evaluation and does not want aggressive testing or other measures only medical therapy at this time.    Patient maintained on Coreg, hydralazine, Imdur.  No ARB or Entresto secondary to renal insufficiency.  Per cardiology 2D echo with EF of 40 to 45% similar to prior 2D echo.  No further cardiac work-up needed.  Patient will follow up with cardiology in the outpatient setting.    Procedures:  2D echo 11/19/2019  Chest x-ray 11/18/2019, 11/19/2019, 11/20/2019  Consultations:  Cardiology: Dr. Sallyanne [name] 11/19/2019   Discharge Exam: Vitals:   11/21/19 0840 11/21/19 1107  BP: (!) 153/80 136/90  Pulse: 98 (!) 106  Resp:  18  Temp: 97.7 F (36.5 C) (!) 97.4 F (36.3 C)  SpO2: 94% 96%    General: NAD Cardiovascular: Irregularly irregular.  Positive JVD. Respiratory: CTAB.  Discharge Instructions   Discharge Instructions    Diet - low sodium heart healthy   Complete by: As directed    Increase activity slowly   Complete by: As directed      Allergies as of 11/21/2019      Reactions   Codeine Other (See Comments)   "talks out of her head"   Neurontin  [gabapentin] Swelling   Leg swelling   Oxycodone Other (See Comments)   confusion      Medication List    STOP taking these medications   diclofenac Sodium 1 % Gel Commonly known as: Voltaren   diltiazem 120 MG 24 hr capsule Commonly known as: CARDIZEM CD   methylPREDNISolone 4 MG tablet Commonly known as: MEDROL     TAKE these medications   acetaminophen 500 MG tablet Commonly known as: TYLENOL Take 1,000 mg by mouth every 6 (six) hours as needed for headache (pain).   apixaban 2.5 MG Tabs tablet Commonly known as: ELIQUIS Take 1 tablet (2.5 mg total) by mouth 2 (two) times daily.   carvedilol 6.25 MG tablet Commonly known as: COREG Take 1 tablet (6.25 mg total) by mouth 2 (two) times daily with a meal.   ferrous sulfate 325 (65 FE) MG tablet Take 325 mg by mouth 2 (two) times daily with a meal.   hydrALAZINE 25 MG tablet Commonly known as: APRESOLINE Take 1 tablet (25 mg total) by mouth 3 (three) times daily. What changed:   medication strength  how much to take   HYDROcodone-acetaminophen 5-325 MG tablet Commonly known as: NORCO/VICODIN Take 1 tablet by mouth every 12 (twelve)  hours as needed for moderate pain.   isosorbide mononitrate 30 MG 24 hr tablet Commonly known as: IMDUR Take 1 tablet (30 mg total) by mouth daily.   LORazepam 0.5 MG tablet Commonly known as: ATIVAN Take 0.5 mg by mouth at bedtime.   LUTEIN PO Take 25 mg by mouth daily.   OXYGEN Inhale 3 L into the lungs See admin instructions. Use every night at bedtime and during the day if needed for shortness of breath   polyethylene glycol 17 g packet Commonly known as: MIRALAX / GLYCOLAX Take 17 g by mouth daily.   pregabalin 50 MG capsule Commonly known as: LYRICA Take 50 mg by mouth daily.   Resveratrol 250 MG Caps Take 250 mg by mouth daily.   saxagliptin HCl 2.5 MG Tabs tablet Commonly known as: ONGLYZA Take 2.5 mg by mouth daily.   sodium bicarbonate 650 MG tablet Take  1 tablet (650 mg total) by mouth 3 (three) times daily.   sodium chloride 0.65 % Soln nasal spray Commonly known as: OCEAN Place 1 spray into both nostrils as needed for congestion.   sodium chloride 5 % ophthalmic solution Commonly known as: MURO 128 Place 1 drop into the left eye 4 (four) times daily.   torsemide 20 MG tablet Commonly known as: DEMADEX Take 2 tablets (40 mg total) by mouth daily. What changed:   how much to take  how to take this  when to take this  additional instructions   Vitamin D (Ergocalciferol) 1.25 MG (50000 UNIT) Caps capsule Commonly known as: DRISDOL Take 1 capsule (50,000 Units total) by mouth every 7 (seven) days.            Durable Medical Equipment  (From admission, onward)         Start     Ordered   11/20/19 0740  For home use only DME 4 wheeled rolling walker with seat  Once       Question:  Patient needs a walker to treat with the following condition  Answer:  Fracture   11/20/19 0739         Allergies  Allergen Reactions  . Codeine Other (See Comments)    "talks out of her head"   . Neurontin [Gabapentin] Swelling    Leg swelling  . Oxycodone Other (See Comments)    confusion     Follow-up Information    Kendra Limbo, MD.   Specialty: Family Medicine Why: The office will call patient Contact information: [address] [place] [address] [phone]                The results of significant diagnostics from this hospitalization (including imaging, microbiology, ancillary and laboratory) are listed below for reference.    Significant Diagnostic Studies: DG Chest 2 View  Result Date: 11/19/2019 CLINICAL DATA:  Shortness of breath EXAM: CHEST - 2 VIEW COMPARISON:  11/18/2019 FINDINGS: Cardiac shadow remains enlarged. Aortic calcifications are again seen. Prominent central vascularity is noted with mild interstitial edema stable from the previous exam. Left-sided pleural effusion  is noted and stable. No new focal abnormality is seen. Old rib fractures and chronic shoulder degenerative changes are seen. IMPRESSION: Stable appearance of the chest with mild changes of CHF. Electronically Signed   By: Inez Catalina M.D.   On: 11/19/2019 09:49   DG Chest 2 View  Result Date: 11/18/2019 CLINICAL DATA:  86 year old female with history of shortness of breath. EXAM: CHEST - 2 VIEW  COMPARISON:  Chest x-ray 11/20/2018. FINDINGS: Lung volumes are low. Opacity at the left base which may reflect atelectasis and/or consolidation. Right lung is clear. No definite pleural effusions. Cephalization of the pulmonary vasculature, without frank pulmonary edema. No pneumothorax. Moderate cardiomegaly. Upper mediastinal contours are distorted by patient's position. Aortic atherosclerosis. Multiple old healed left-sided rib fractures. Advanced degenerative changes in the glenohumeral joints bilaterally (left greater than right). IMPRESSION: 1. Low lung volumes with atelectasis and/or consolidation in the left lower lobe. 2. Moderate cardiomegaly with pulmonary venous congestion, but no frank pulmonary edema. 3. Aortic atherosclerosis. Electronically Signed   By: Vinnie Langton M.D.   On: 11/18/2019 13:47   MR Lumbar Spine Wo Contrast  Result Date: 10/22/2019 CLINICAL DATA:  Initial evaluation for right lower extremity pain, lumbar radiculopathy. EXAM: MRI LUMBAR SPINE WITHOUT CONTRAST TECHNIQUE: Multiplanar, multisequence MR imaging of the lumbar spine was performed. No intravenous contrast was administered. COMPARISON:  None available. FINDINGS: Segmentation: Standard. Lowest well-formed disc space labeled the L5-S1 level. Alignment: Trace scoliotic curvature. 4 mm retrolisthesis of L2 on L3. Trace 2 mm retrolisthesis of L3 on L4 and L4 on L5, with trace anterolisthesis of L5 on S1. Findings chronic and facet mediated. Vertebrae: Severe chronic compression deformity involving the L2 vertebral body with  near complete height loss/vertebral plana. Associated mild 3 mm bony retropulsion. This is benign/mechanical in appearance. Otherwise, vertebral body height maintained without evidence for acute or chronic fracture. Underlying bone marrow signal intensity within normal limits. Few scattered benign hemangiomata noted, most prominent of which measures 14 mm within the T11 vertebral body. No other discrete or worrisome osseous lesions. Discogenic reactive endplate changes present about the L3-4 interspace. No other abnormal marrow edema. Conus medullaris and cauda equina: Conus extends to the L1 level. Conus and cauda equina appear normal. Paraspinal and other soft tissues: Paraspinous soft tissues demonstrate no acute finding. Asymmetric fatty atrophy noted within the left psoas muscle. Few tiny cysts noted within the kidneys bilaterally. Visualized visceral structures otherwise unremarkable. Disc levels: L1-2: Diffuse disc bulge with disc desiccation. Large prominent annular fissure noted anteriorly. Up to 3 mm bony retropulsion related to the chronic L2 compression fracture. Mild facet hypertrophy. Resultant mild spinal stenosis. Foramina remain patent. L2-3: 4 mm retrolisthesis. Diffuse disc bulge with disc desiccation and intervertebral disc space narrowing. Reactive endplate changes with marginal endplate spurring, most notable on the left. Moderate bilateral facet hypertrophy. No significant spinal stenosis. Mild right worse than left foraminal narrowing. L3-4: Chronic intervertebral disc space narrowing with diffuse disc bulge and disc desiccation. Reactive endplate changes with marginal endplate osteophytic spurring. Mild to moderate bilateral facet hypertrophy, greater on the left. Resultant moderate canal with moderate left worse than right lateral recess stenosis. Foramina remain patent. L4-5: Diffuse disc bulge with disc desiccation and intervertebral disc space narrowing. Mild to moderate facet  hypertrophy. No significant spinal stenosis. Foramina remain patent. L5-S1: Diffuse disc bulge with disc desiccation. Moderate bilateral facet hypertrophy. No significant canal or lateral recess stenosis. Foramina remain patent. IMPRESSION: 1. Severe chronic compression deformity involving the L2 vertebral body with near complete height loss anteriorly. Associated mild 3 mm bony retropulsion with resultant mild spinal stenosis. This is benign/mechanical in appearance. 2. Disc bulging with facet hypertrophy at L3-4 with resultant moderate canal and left worse than right lateral recess stenosis. 3. Additional mild noncompressive disc bulging at L2-3, L4-5 and L5-S1 without significant stenosis or neural impingement. Electronically Signed   By: Jeannine Boga M.D.   On: 10/22/2019  19:33   MR ANKLE RIGHT WO CONTRAST  Result Date: 10/23/2019 CLINICAL DATA:  Three-week history of ankle pain. EXAM: MRI OF THE RIGHT ANKLE WITHOUT CONTRAST TECHNIQUE: Multiplanar, multisequence MR imaging of the ankle was performed. No intravenous contrast was administered. COMPARISON:  Radiographs 08/21/2019 FINDINGS: TENDONS Peroneal: Intact Posteromedial: Intact Anterior: Intact Achilles: Normal Plantar Fascia: Intact. Moderate changes of plantar fasciitis and a moderate-sized calcaneal heel spur but no complete tear/rupture. LIGAMENTS Lateral: Intact Medial: Intact CARTILAGE Ankle Joint: Moderate degenerative chondrosis, joint space narrowing and subchondral cystic change. Small joint effusion. Band of low T1 and T2 signal intensity in the subarticular region of the tibial plafond without significant surrounding marrow edema is most likely a healed or healing subchondral stress fracture or focus of spontaneous osteonecrosis. Subtalar Joints/Sinus Tarsi: Mild degenerative changes. Fluid and edema in the sinus tarsi but the cervical and interosseous ligaments are intact. The spring ligament is intact. Invagination of vascular  tissue noted into the adjacent calcaneus. Bones: Probable stress or insufficiency fracture involving the base of the medial malleolus with significant surrounding marrow edema. No through and through fracture is identified. Subacute/healing fracture involving the distal fibular shaft with callus formation. Other: Mild diffuse soft tissue swelling/edema and mild myositis. IMPRESSION: 1. Probable stress or insufficiency fracture involving the base of the medial malleolus with significant surrounding marrow edema. No through and through fracture is identified. 2. Subacute/healing fracture involving the distal fibular shaft with callus formation. 3. Moderate changes of plantar fasciitis and a moderate-sized calcaneal heel spur but no complete tear/rupture. 4. Moderate tibiotalar joint degenerative changes. 5. Intact medial and lateral ankle ligaments and tendons. Electronically Signed   By: Marijo Sanes M.D.   On: 10/23/2019 08:08   DG CHEST PORT 1 VIEW  Result Date: 11/20/2019 CLINICAL DATA:  86 year old female with history of shortness of breath on exertion. Wheezing. EXAM: PORTABLE CHEST 1 VIEW COMPARISON:  Chest x-ray 06/21/2019. FINDINGS: Lung volumes are low. Ill-defined opacity at the left base which may reflect atelectasis and/or consolidation. Small left pleural effusion. No definite right pleural effusion. No evidence of pulmonary edema. No pneumothorax. Moderate cardiomegaly. [name] rotated to the right on today's exam, resulting in distortion of the mediastinal contours and reduced diagnostic sensitivity and specificity for mediastinal pathology. Aortic atherosclerosis. IMPRESSION: 1. Atelectasis and/or consolidation in the left lower lobe with small left pleural effusion. 2. Moderate cardiomegaly. 3. Aortic atherosclerosis. Electronically Signed   By: Vinnie Langton M.D.   On: 11/20/2019 10:30   ECHOCARDIOGRAM COMPLETE  Result Date: 11/19/2019    ECHOCARDIOGRAM REPORT   Patient Name:    [name] Date of Exam: 11/19/2019 Medical Rec #:  [id number]     Height:       62.0 in Accession #:    [id number]    Weight:       108.9 lb Date of Birth:  05-06-1926    BSA:          1.476 m Patient Age:    49 years      BP:           142/77 mmHg Patient Gender: F             HR:           86 bpm. Exam Location:  Inpatient Procedure: 2D Echo and 3D Echo Indications:    CHF-Acute Systolic 268.34 / H96.22  History:        Patient has prior history of Echocardiogram examinations, most  recent 08/03/2015. Arrythmias:Atrial Fibrillation; Risk                 Factors:Hypertension, Dyslipidemia and Diabetes. Chronic kidney                 disease, past history of breast cancer, cardiomyopathy.  Sonographer:    Darlina Sicilian RDCS Referring Phys: [id number] [place]  1. Left ventricular ejection fraction, by estimation, is 40 to 45%. The left ventricle has mildly decreased function. The left ventricle demonstrates global hypokinesis. Left ventricular diastolic function could not be evaluated.  2. Right ventricular systolic function is mildly reduced. The right ventricular size is mildly enlarged. There is moderately elevated pulmonary artery systolic pressure. The estimated right ventricular systolic pressure is 93.7 mmHg.  3. Left atrial size was severely dilated.  4. Right atrial size was severely dilated.  5. The mitral valve is normal in structure. Mild mitral valve regurgitation. No evidence of mitral stenosis.  6. Tricuspid valve regurgitation is moderate to severe.  7. The aortic valve is normal in structure. Aortic valve regurgitation is moderate. No aortic stenosis is present.  8. Aortic dilatation noted. There is moderate to severe dilatation of the ascending aorta measuring 46 mm. FINDINGS  Left Ventricle: Left ventricular ejection fraction, by estimation, is 40 to 45%. The left ventricle has mildly decreased function. The left ventricle demonstrates global hypokinesis. The left  ventricular internal cavity size was normal in size. There is  no left ventricular hypertrophy. Left ventricular diastolic function could not be evaluated due to atrial fibrillation. Left ventricular diastolic function could not be evaluated. The ratio of pulmonic flow to systemic flow (Qp/Qs ratio) is 0.60. Right Ventricle: The right ventricular size is mildly enlarged. No increase in right ventricular wall thickness. Right ventricular systolic function is mildly reduced. There is moderately elevated pulmonary artery systolic pressure. The tricuspid regurgitant velocity is 3.52 m/s, and with an assumed right atrial pressure of 8 mmHg, the estimated right ventricular systolic pressure is 34.2 mmHg. Left Atrium: Left atrial size was severely dilated. Right Atrium: Right atrial size was severely dilated. Pericardium: There is no evidence of pericardial effusion. Mitral Valve: The mitral valve is normal in structure. Mild mitral valve regurgitation. No evidence of mitral valve stenosis. Tricuspid Valve: The tricuspid valve is normal in structure. Tricuspid valve regurgitation is moderate to severe. No evidence of tricuspid stenosis. Aortic Valve: The aortic valve is normal in structure. Aortic valve regurgitation is moderate. No aortic stenosis is present. Pulmonic Valve: The pulmonic valve was grossly normal. Pulmonic valve regurgitation is mild. No evidence of pulmonic stenosis. Aorta: Aortic dilatation noted. There is moderate to severe dilatation of the ascending aorta measuring 46 mm. IAS/Shunts: The atrial septum is grossly normal. The ratio of pulmonic flow to systemic flow (Qp/Qs ratio) is 0.60.  LEFT VENTRICLE PLAX 2D LVIDd:         5.20 cm LVIDs:         4.00 cm LV PW:         0.90 cm LV IVS:        1.00 cm LVOT diam:     2.20 cm LV SV:         57 LV SV Index:   38 LVOT Area:     3.80 cm  LV Volumes (MOD) LV vol d, MOD A2C: 155.0 ml LV vol d, MOD A4C: 93.0 ml LV vol s, MOD A2C: 83.5 ml LV vol s, MOD A4C:  57.0 ml LV SV  MOD A2C:     71.5 ml LV SV MOD A4C:     93.0 ml LV SV MOD BP:      55.4 ml RIGHT VENTRICLE RVOT diam:      2.10 cm LEFT ATRIUM             Index       RIGHT ATRIUM           Index LA diam:        3.80 cm 2.57 cm/m  RA Area:     30.00 cm LA Vol (A2C):   92.1 ml 62.38 ml/m RA Volume:   105.00 ml 71.11 ml/m LA Vol (A4C):   74.0 ml 50.12 ml/m LA Biplane Vol: 84.7 ml 57.37 ml/m  AORTIC VALVE             PULMONIC VALVE LVOT Vmax:   98.80 cm/s  RVOT Peak grad: 2 mmHg LVOT Vmean:  64.300 cm/s LVOT VTI:    0.149 m  AORTA Ao Root diam: 4.10 cm Ao Asc diam:  4.60 cm MITRAL VALVE               TRICUSPID VALVE MV Area (PHT): 6.00 cm    TR Peak grad:   49.6 mmHg MV Decel Time: 127 msec    TR Vmax:        352.00 cm/s MV E velocity: 95.30 cm/s                            SHUNTS                            Systemic VTI:  0.15 m                            Systemic Diam: 2.20 cm                            Pulmonic VTI:  0.096 m                            Pulmonic Diam: 2.10 cm                            Qp/Qs:         0.58 Mertie Moores MD Electronically signed by Mertie Moores MD Signature Date/Time: 11/19/2019/1:34:03 PM    Final     Microbiology: Recent Results (from the past 240 hour(s))  SARS Coronavirus 2 by RT PCR (hospital order, performed in [place] hospital lab) Nasopharyngeal Nasopharyngeal Swab     Status: None   Collection Time: 11/19/19  8:00 AM   Specimen: Nasopharyngeal Swab  Result Value Ref Range Status   SARS Coronavirus 2 NEGATIVE NEGATIVE Final    Comment: (NOTE) SARS-CoV-2 target nucleic acids are NOT DETECTED.  The SARS-CoV-2 RNA is generally detectable in upper and lower respiratory specimens during the acute phase of infection. The lowest concentration of SARS-CoV-2 viral copies this assay can detect is 250 copies / mL. A negative result does not preclude SARS-CoV-2 infection and should not be used as the sole basis for treatment or other patient management decisions.  A  negative result may occur with improper specimen collection / handling, submission of specimen other than  nasopharyngeal swab, presence of viral mutation(s) within the areas targeted by this assay, and inadequate number of viral copies (<250 copies / mL). A negative result must be combined with clinical observations, patient history, and epidemiological information.  Fact Sheet for Patients:   StrictlyIdeas.no  Fact Sheet for Healthcare Providers: BankingDealers.co.za  This test is not yet approved or  cleared by the [place] FDA and has been authorized for detection and/or diagnosis of SARS-CoV-2 by FDA under an Emergency Use Authorization (EUA).  This EUA will remain in effect (meaning this test can be used) for the duration of the COVID-19 declaration under Section 564(b)(1) of the Act, 21 U.S.C. section 360bbb-3(b)(1), unless the authorization is terminated or revoked sooner.  Performed at [facility], [place] [address] [place], [place] [address]      Labs: Basic Metabolic Panel: Recent Labs  Lab 11/18/19 1245 11/19/19 0006 11/20/19 0416 11/21/19 0550  NA 142 140 144 141  K 4.5 3.7 3.4* 3.9  CL 111 108 110 108  CO2 15* 17* 17* 16*  GLUCOSE 176* 154* 110* 118*  BUN 109* 107* 109* 105*  CREATININE 3.27* 2.99* 3.12* 3.07*  CALCIUM 9.8 9.7 9.5 9.3  MG  --  2.6*  --  2.3  PHOS  --  5.2*  --   --    Liver Function Tests: No results for input(s): AST, ALT, ALKPHOS, BILITOT, PROT, ALBUMIN in the last 168 hours. No results for input(s): LIPASE, AMYLASE in the last 168 hours. No results for input(s): AMMONIA in the last 168 hours. CBC: Recent Labs  Lab 11/18/19 1245 11/20/19 0416  WBC 9.8 7.3  HGB 11.5* 10.4*  HCT 34.9* 31.8*  MCV 92.3 92.2  PLT 286 235   Cardiac Enzymes: No results for input(s): CKTOTAL, CKMB, CKMBINDEX, TROPONINI in the last 168 hours. BNP: BNP (last 3 results) Recent Labs     11/18/19 1245 11/20/19 0934  BNP 781.6* 911.9*    ProBNP (last 3 results) No results for input(s): PROBNP in the last 8760 hours.  CBG: Recent Labs  Lab 11/20/19 1133 11/20/19 1559 11/20/19 2102 11/21/19 0558 11/21/19 1109  GLUCAP 134* 133* 154* 110* 130*       Signed:  Irine Seal MD.  [facility] 11/21/2019, 11:23 AM

## 2019-11-21 NOTE — TOC Transition Note (Signed)
Transition of Care [facility]) - CM/SW Discharge Note   Patient Details  Name: [name] MRN: [id number] Date of Birth: [birth year]  [facility] CM/SW Contact:  Zenon Mayo, RN Phone Number: 11/21/2019, 12:19 PM   Clinical Narrative:    NCM spoke with Dian Situ at [place], he states he is unable to take referral.  NCM made referral to [place] with [facility], she is able to take referral for [place], [place], [place].  Soc will begin next week.     Final next level of care: [place] Barriers to Discharge: No Barriers Identified   Patient Goals and CMS Choice Patient states their goals for this hospitalization and ongoing recovery are:: get better, be able to move around like she did before CMS Medicare.gov Compare Post Acute Care list provided to:: Patient Represenative (must comment) Choice offered to / list presented to : Adult Children  Discharge Placement                       Discharge Plan and Services In-house Referral: NA Discharge Planning Services: CM Consult Post Acute Care Choice: Home Health            DME Agency: NA       HH Arranged: PT, OT, Social Work [employer] Agency: [facility] [employer] [facility] [employer] Date [place]: 11/21/19 Time [place]: 1219 Representative spoke with at [place]: [place]) Interventions     Readmission Risk Interventions Readmission Risk Prevention Plan 11/20/2019 11/20/2019  Transportation Screening Complete Complete  PCP or Specialist Appt within 3-5 Days Complete -  [place] or [place] Complete Patient refused  Social Work Consult for [place] Planning/Counseling Complete Complete  Palliative Care Screening Complete Not Applicable  Medication Review Press photographer) Complete Complete  Some recent data might be hidden

## 2019-11-21 NOTE — Telephone Encounter (Signed)
Patient's daughter calling stating she was told to call and give the patient's base weight when she got home from the hospital. She states it is 128.5 lbs

## 2019-11-21 NOTE — Progress Notes (Addendum)
Progress Note  Patient Name: [name] Date of Encounter: 11/21/2019  [facility] Cardiologist: Kirk Ruths, MD   Subjective   Feels well, wants to go home. Slight net negative fluid balance after changing to oral diuretics. JVP lower. Creat stable at 3.07. Weight has decreased further.  Inpatient Medications    Scheduled Meds: . apixaban  2.5 mg Oral BID  . bumetanide (BUMEX) IV  0.5 mg Intravenous Q12H  . carvedilol  6.25 mg Oral BID WC  . ferrous sulfate  325 mg Oral BID WC  . hydrALAZINE  25 mg Oral TID  . insulin aspart  0-9 Units Subcutaneous TID WC  . ipratropium  0.5 mg Nebulization BID  . isosorbide mononitrate  30 mg Oral Daily  . levalbuterol  0.63 mg Nebulization BID  . LORazepam  0.5 mg Oral QHS  . polyethylene glycol  17 g Oral Daily  . pregabalin  50 mg Oral Daily  . sodium bicarbonate  650 mg Oral TID  . sodium chloride  1 drop Left Eye QID  . sodium chloride flush  3 mL Intravenous Once  . sodium chloride flush  3 mL Intravenous Q12H   Continuous Infusions: . sodium chloride     PRN Meds: sodium chloride, acetaminophen, metoprolol tartrate, ondansetron (ZOFRAN) IV, sodium chloride, sodium chloride flush   Vital Signs    Vitals:   11/21/19 0556 11/21/19 0734 11/21/19 0738 11/21/19 0840  BP: (!) 141/80   (!) 153/80  Pulse: 99   98  Resp: 20     Temp: 97.7 F (36.5 C)   97.7 F (36.5 C)  TempSrc: Oral   Oral  SpO2: 96% 93% 93% 94%  Weight:      Height:        Intake/Output Summary (Last 24 hours) at 11/21/2019 1028 Last data filed at 11/21/2019 0849 Gross per 24 hour  Intake 660 ml  Output 1051 ml  Net -391 ml   Last 3 Weights 11/21/2019 11/20/2019 11/19/2019  Weight (lbs) 94 lb 5.7 oz 101 lb 10.1 oz 108 lb 14.5 oz  Weight (kg) 42.8 kg 46.1 kg 49.4 kg      Telemetry    AFib, rate controlled - Personally Reviewed  ECG    No new tracing - Personally Reviewed  Physical Exam  Elderly, frail GEN: No acute distress.     Neck: 5-6 cm JVD Cardiac: irregular, no murmurs, rubs, or gallops.  Respiratory: Clear to auscultation bilaterally. GI: Soft, nontender, non-distended  MS: No edema; No deformity. Neuro:  Nonfocal  Psych: Normal affect   Labs    High Sensitivity Troponin:   Recent Labs  Lab 11/18/19 1245 11/18/19 1432  TROPONINIHS 46* 50*      Chemistry Recent Labs  Lab 11/19/19 0006 11/20/19 0416 11/21/19 0550  NA 140 144 141  K 3.7 3.4* 3.9  CL 108 110 108  CO2 17* 17* 16*  GLUCOSE 154* 110* 118*  BUN 107* 109* 105*  CREATININE 2.99* 3.12* 3.07*  CALCIUM 9.7 9.5 9.3  GFRNONAA 13* 12* 13*  GFRAA 15* 14* 15*  ANIONGAP 15 17* 17*     Hematology Recent Labs  Lab 11/18/19 1245 11/20/19 0416  WBC 9.8 7.3  RBC 3.78* 3.45*  HGB 11.5* 10.4*  HCT 34.9* 31.8*  MCV 92.3 92.2  MCH 30.4 30.1  MCHC 33.0 32.7  RDW 15.2 15.1  PLT 286 235    BNP Recent Labs  Lab 11/18/19 1245 11/20/19 0934  BNP 781.6* 911.9*  DDimer No results for input(s): DDIMER in the last 168 hours.   Radiology    DG CHEST PORT 1 VIEW  Result Date: 11/20/2019 CLINICAL DATA:  86 year old female with history of shortness of breath on exertion. Wheezing. EXAM: PORTABLE CHEST 1 VIEW COMPARISON:  Chest x-ray 06/21/2019. FINDINGS: Lung volumes are low. Ill-defined opacity at the left base which may reflect atelectasis and/or consolidation. Small left pleural effusion. No definite right pleural effusion. No evidence of pulmonary edema. No pneumothorax. Moderate cardiomegaly. [name] rotated to the right on today's exam, resulting in distortion of the mediastinal contours and reduced diagnostic sensitivity and specificity for mediastinal pathology. Aortic atherosclerosis. IMPRESSION: 1. Atelectasis and/or consolidation in the left lower lobe with small left pleural effusion. 2. Moderate cardiomegaly. 3. Aortic atherosclerosis. Electronically Signed   By: Vinnie Langton M.D.   On: 11/20/2019 10:30    ECHOCARDIOGRAM COMPLETE  Result Date: 11/19/2019    ECHOCARDIOGRAM REPORT   Patient Name:   [name] Date of Exam: 11/19/2019 Medical Rec #:  [id number]     Height:       62.0 in Accession #:    [id number]    Weight:       108.9 lb Date of Birth:  07/20/1925    BSA:          1.476 m Patient Age:    86 years      BP:           142/77 mmHg Patient Gender: F             HR:           86 bpm. Exam Location:  Inpatient Procedure: 2D Echo and 3D Echo Indications:    CHF-Acute Systolic 295.28 / U13.24  History:        Patient has prior history of Echocardiogram examinations, most                 recent 08/03/2015. Arrythmias:Atrial Fibrillation; Risk                 Factors:Hypertension, Dyslipidemia and Diabetes. Chronic kidney                 disease, past history of breast cancer, cardiomyopathy.  Sonographer:    Darlina Sicilian RDCS Referring Phys: [id number] [place]  1. Left ventricular ejection fraction, by estimation, is 40 to 45%. The left ventricle has mildly decreased function. The left ventricle demonstrates global hypokinesis. Left ventricular diastolic function could not be evaluated.  2. Right ventricular systolic function is mildly reduced. The right ventricular size is mildly enlarged. There is moderately elevated pulmonary artery systolic pressure. The estimated right ventricular systolic pressure is 53.6 mmHg.  3. Left atrial size was severely dilated.  4. Right atrial size was severely dilated.  5. The mitral valve is normal in structure. Mild mitral valve regurgitation. No evidence of mitral stenosis.  6. Tricuspid valve regurgitation is moderate to severe.  7. The aortic valve is normal in structure. Aortic valve regurgitation is moderate. No aortic stenosis is present.  8. Aortic dilatation noted. There is moderate to severe dilatation of the ascending aorta measuring 46 mm. FINDINGS  Left Ventricle: Left ventricular ejection fraction, by estimation, is 40 to 45%. The left  ventricle has mildly decreased function. The left ventricle demonstrates global hypokinesis. The left ventricular internal cavity size was normal in size. There is  no left ventricular hypertrophy. Left ventricular diastolic function could not be evaluated due  to atrial fibrillation. Left ventricular diastolic function could not be evaluated. The ratio of pulmonic flow to systemic flow (Qp/Qs ratio) is 0.60. Right Ventricle: The right ventricular size is mildly enlarged. No increase in right ventricular wall thickness. Right ventricular systolic function is mildly reduced. There is moderately elevated pulmonary artery systolic pressure. The tricuspid regurgitant velocity is 3.52 m/s, and with an assumed right atrial pressure of 8 mmHg, the estimated right ventricular systolic pressure is 01.7 mmHg. Left Atrium: Left atrial size was severely dilated. Right Atrium: Right atrial size was severely dilated. Pericardium: There is no evidence of pericardial effusion. Mitral Valve: The mitral valve is normal in structure. Mild mitral valve regurgitation. No evidence of mitral valve stenosis. Tricuspid Valve: The tricuspid valve is normal in structure. Tricuspid valve regurgitation is moderate to severe. No evidence of tricuspid stenosis. Aortic Valve: The aortic valve is normal in structure. Aortic valve regurgitation is moderate. No aortic stenosis is present. Pulmonic Valve: The pulmonic valve was grossly normal. Pulmonic valve regurgitation is mild. No evidence of pulmonic stenosis. Aorta: Aortic dilatation noted. There is moderate to severe dilatation of the ascending aorta measuring 46 mm. IAS/Shunts: The atrial septum is grossly normal. The ratio of pulmonic flow to systemic flow (Qp/Qs ratio) is 0.60.  LEFT VENTRICLE PLAX 2D LVIDd:         5.20 cm LVIDs:         4.00 cm LV PW:         0.90 cm LV IVS:        1.00 cm LVOT diam:     2.20 cm LV SV:         57 LV SV Index:   38 LVOT Area:     3.80 cm  LV Volumes (MOD) LV  vol d, MOD A2C: 155.0 ml LV vol d, MOD A4C: 93.0 ml LV vol s, MOD A2C: 83.5 ml LV vol s, MOD A4C: 57.0 ml LV SV MOD A2C:     71.5 ml LV SV MOD A4C:     93.0 ml LV SV MOD BP:      55.4 ml RIGHT VENTRICLE RVOT diam:      2.10 cm LEFT ATRIUM             Index       RIGHT ATRIUM           Index LA diam:        3.80 cm 2.57 cm/m  RA Area:     30.00 cm LA Vol (A2C):   92.1 ml 62.38 ml/m RA Volume:   105.00 ml 71.11 ml/m LA Vol (A4C):   74.0 ml 50.12 ml/m LA Biplane Vol: 84.7 ml 57.37 ml/m  AORTIC VALVE             PULMONIC VALVE LVOT Vmax:   98.80 cm/s  RVOT Peak grad: 2 mmHg LVOT Vmean:  64.300 cm/s LVOT VTI:    0.149 m  AORTA Ao Root diam: 4.10 cm Ao Asc diam:  4.60 cm MITRAL VALVE               TRICUSPID VALVE MV Area (PHT): 6.00 cm    TR Peak grad:   49.6 mmHg MV Decel Time: 127 msec    TR Vmax:        352.00 cm/s MV E velocity: 95.30 cm/s  SHUNTS                            Systemic VTI:  0.15 m                            Systemic Diam: 2.20 cm                            Pulmonic VTI:  0.096 m                            Pulmonic Diam: 2.10 cm                            Qp/Qs:         0.58 Mertie Moores MD Electronically signed by Mertie Moores MD Signature Date/Time: 11/19/2019/1:34:03 PM    Final     Cardiac Studies   Echo 07/2015 Study Conclusions   - Left ventricle: Wall thickness was increased in a pattern of  moderate LVH. There was focal basal hypertrophy. Systolic  function was moderately reduced. The estimated ejection fraction  was in the range of 35% to 40%. Dyskinesis of the anteroseptal  myocardium.  - Aortic valve: There was moderate regurgitation.  - Mitral valve: There was severe regurgitation.  - Left atrium: The atrium was severely dilated.  - Right atrium: The atrium was mildly dilated.  - Tricuspid valve: There was mild-moderate regurgitation.  - Pulmonary arteries: Systolic pressure was mildly increased. PA  peak pressure: 33 mm Hg  (S).  ECHO 11/19/2019  1. Left ventricular ejection fraction, by estimation, is 40 to 45%. The  left ventricle has mildly decreased function. The left ventricle  demonstrates global hypokinesis. Left ventricular diastolic function could  not be evaluated.  2. Right ventricular systolic function is mildly reduced. The right  ventricular size is mildly enlarged. There is moderately elevated  pulmonary artery systolic pressure. The estimated right ventricular  systolic pressure is 26.8 mmHg.  3. Left atrial size was severely dilated.  4. Right atrial size was severely dilated.  5. The mitral valve is normal in structure. Mild mitral valve  regurgitation. No evidence of mitral stenosis.  6. Tricuspid valve regurgitation is moderate to severe.  7. The aortic valve is normal in structure. Aortic valve regurgitation is  moderate. No aortic stenosis is present.  8. Aortic dilatation noted. There is moderate to severe dilatation of the  ascending aorta measuring 46 mm.  ...estimated right ventricular systolic pressure is  34.1 mmHg.   Patient Profile     86 y.o. female with a hx of permanent atrial fibrillation, congestive heart failure(EF 40%),mild MR, moderate AI, moderate -severe TR, CKD 4 admitted with acute exacerbation of HF, trigger unclear.  Assessment & Plan    Acute on chronic systolic CHF - I think this is the right balance between CHF and renal function - our hospital scale reports 94 kg, much lower than the previous estimate of her "dry weight". She should weigh as soon as she gets home. - OK for DC home on 40 mg torsemide daily, but call office if weight increases 3 lb/24 h or ever increases  >5 lb over today's weight.   Permanent atrial fibrillation - diltiazem stopped due to low EF - carvedilol working reasonably  well for rate control and CHF - Eliquis 2.5 mg twice daily for stroke prophylaxis  Cardiomyopathy - Has a very broad LBBB and may benefit from  CRT, but she has previously declined further evaluation and does not want aggressive testing or other measures.wants medical therapy -Continue beta-blocker, hydralazine 25 mg 3 times daily, Imdur 30 mg daily - No ARB or Entresto or spironolactone given baseline renal insufficiency - Echo with similar from prior with EF at 40-45%  AKI/CKD stage IV -Creatinine baseline reportedly around 2.6.Has been steady around 3.0 during this admission -Followed by nephrology as outpatient -Patient is DNR and has declined HD in the past  [place] will sign off.   Medication Recommendations:  Torsemide 40 mg daily, carvedilol 6.25 mg twice daily, isosorbide mononitrate 30 mg daily, hydralazine 25 mg 3 times daily, apixaban 2.5 mg twice daily Stop diltiazem Other recommendations (labs, testing, etc):  Daily weights and keep written log, bring it to appointments. Weigh as soon as she gets home and please call our office to document her weight on home scale. Call office if weight increases 3 lb/24 h or ever increases  >5 lb over today's weight.  Follow up as an outpatient:  TOC visit with APP in 1-2 weeks, Dr. Stanford Breed in 2-3 months  For questions or updates, please contact [place] Please consult [email or web address] for contact info under        Signed, Sanda Klein, MD  11/21/2019, 10:28 AM

## 2019-11-21 NOTE — Telephone Encounter (Signed)
Spoke with pt daughter, per discharge they need to call if weight increase from this number 3 lbs overnight or 5 lbs in one week.

## 2019-11-23 ENCOUNTER — Telehealth: Payer: Self-pay | Admitting: Medical

## 2019-11-23 ENCOUNTER — Other Ambulatory Visit: Payer: Self-pay | Admitting: Medical

## 2019-11-23 DIAGNOSIS — I5033 Acute on chronic diastolic (congestive) heart failure: Secondary | ICD-10-CM

## 2019-11-23 NOTE — Telephone Encounter (Signed)
Patient's daughter called regarding 3.8lbs weight gain overnight. Patient takes Torsemide 20mg  BID. She also reported O2 sats dropped to 89% while walking to the bathroom. However patient uses 3LO2 at night and sometimes during the day and did not have O2 on when sats dropped. Once they put her back on 3L her O2 went up to 97%. Denies chest pain or worsening LLE/orthopnea.   She was recently admitted 6/22-6/25 for Acute CHF, AKI (with new baseline cr around 3 up from 2.6), acidosis. Diuresed about 40lbs.   I recommended she double her Torsemide for 3 days and then go back to original dose. Will check labs Wednesday. She will also see her PCP this week. And I encouraged her to see nephrologist (follows with [facility]) if she doesn't have a follow-up. She has a TOC apt with Kerin Ransom 7/8. Also reminded daughter/pt should not hesitate to call 911 or bring her to the ED if she has worsening symptoms (sob, LLE, CP).   Maryuri Warnke Kathlen Mody, PA-C

## 2019-11-24 ENCOUNTER — Telehealth: Payer: Self-pay

## 2019-11-24 DIAGNOSIS — I5033 Acute on chronic diastolic (congestive) heart failure: Secondary | ICD-10-CM

## 2019-11-24 NOTE — Telephone Encounter (Signed)
-----   Message from [place], PA-C sent at 11/23/2019  9:39 AM EDT ----- Regarding: labs Pt called over the weekend for weight gain. Advised double torsemide for 3 days and need re-check labs Wednesday Jun30th. Orders in for BMET. Please call and schedule if needed.   thanks

## 2019-11-24 NOTE — Telephone Encounter (Signed)
Called and d/w Toya Smothers (daughter)she will bring in pt to the lab on Wednesday at the PCP she will call if lab orders need to be faxed over.  she states that pt's weight is up again today 1# informed her to call back tomorrow with status/weight gain tomorrow. She will elevate while sitting, etc. Denies any SOB at this time. Will await CB tomorrow

## 2019-11-26 ENCOUNTER — Telehealth: Payer: Self-pay | Admitting: Cardiology

## 2019-11-26 NOTE — Telephone Encounter (Signed)
New Message:     Pt's daughter called. She said pt's primary doctor was going to do lab and send the results to Dr Stanford Breed.

## 2019-11-28 NOTE — Telephone Encounter (Signed)
Spoke with pt daughter, aware printed the lab work from care everywhere and will show to dr Stanford Breed next week on his return.

## 2019-12-03 ENCOUNTER — Ambulatory Visit (INDEPENDENT_AMBULATORY_CARE_PROVIDER_SITE_OTHER): Payer: Medicare Other | Admitting: Orthopedic Surgery

## 2019-12-03 ENCOUNTER — Ambulatory Visit: Payer: Self-pay

## 2019-12-03 DIAGNOSIS — M84371A Stress fracture, right ankle, initial encounter for fracture: Secondary | ICD-10-CM | POA: Diagnosis not present

## 2019-12-04 ENCOUNTER — Ambulatory Visit (INDEPENDENT_AMBULATORY_CARE_PROVIDER_SITE_OTHER): Payer: Medicare Other | Admitting: Cardiology

## 2019-12-04 ENCOUNTER — Other Ambulatory Visit: Payer: Self-pay

## 2019-12-04 ENCOUNTER — Encounter: Payer: Self-pay | Admitting: Cardiology

## 2019-12-04 DIAGNOSIS — N185 Chronic kidney disease, stage 5: Secondary | ICD-10-CM | POA: Diagnosis not present

## 2019-12-04 DIAGNOSIS — I4821 Permanent atrial fibrillation: Secondary | ICD-10-CM | POA: Diagnosis not present

## 2019-12-04 DIAGNOSIS — I5042 Chronic combined systolic (congestive) and diastolic (congestive) heart failure: Secondary | ICD-10-CM | POA: Diagnosis not present

## 2019-12-04 NOTE — Assessment & Plan Note (Signed)
She appears compensated on exam.  The family has instructions on adjusting her diuretics based on her weight

## 2019-12-04 NOTE — Assessment & Plan Note (Signed)
BUN/SCr done 11/26/2019- 86/2.87 GFR 13.  She is followed at [facility]

## 2019-12-04 NOTE — Patient Instructions (Addendum)
Medication Instructions:  Your physician recommends that you continue on your current medications as directed. Please refer to the Current Medication list given to you today.  *If you need a refill on your cardiac medications before your next appointment, please call your pharmacy*  Lab Work: NONE ordered at this time of appointment   If you have labs (blood work) drawn today and your tests are completely normal, you will receive your results only by: [name] MyChart Message (if you have MyChart) OR . A paper copy in the mail If you have any lab test that is abnormal or we need to change your treatment, we will call you to review the results.  Testing/Procedures: NONE ordered at this time of appointment   Follow-Up: At [facility], you and your health needs are our priority.  As part of our continuing mission to provide you with exceptional heart care, we have created designated Provider Care Teams.  These Care Teams include your primary Cardiologist (physician) and Advanced Practice Providers (APPs -  Physician Assistants and Nurse Practitioners) who all work together to provide you with the care you need, when you need it.  Your next appointment:   As scheduled    The format for your next appointment:   In Person  Provider:   Kirk Ruths, MD  Other Instructions

## 2019-12-04 NOTE — Assessment & Plan Note (Signed)
Conservative mgmt with rate control and Eliquis.

## 2019-12-04 NOTE — Progress Notes (Signed)
Cardiology Office Note:    Date:  12/04/2019   ID:  [name], DOB 12/08/1925, MRN [id number]  PCP:  Bernerd Limbo, MD  Cardiologist:  Kirk Ruths, MD  Electrophysiologist:  None   Referring MD: Bernerd Limbo, MD   CC:  Post hospital follow up  History of Present Illness:    [name] is a 86 y.o. female with a hx of chronic atrial fibrillation on low-dose Eliquis, chronic renal insufficiency stage IV, and mixed chronic heart failure.  She was sent from her facility 11/21/2019 because of low oxygen levels and a left pleural effusion on exam.  Echocardiogram 11/19/2019 showed an ejection fraction of 40 to 45% with PA pressures of 57.6 mm, severe left atrial enlargement, and aortic root enlargement of 46 mm.  She was gently diuresed and discharged 11/21/2019.  Patient is frail and in a wheelchair.  She is seen in the office today in follow-up.  The patient's family accompanying her.  They indicate that [name] does not want dialysis and "never wants to go to the hospital again".  Home hospice is now involved.  Since discharge she has been stable.  Past Medical History:  Diagnosis Date  .  Rib fractures - right #8-10 05/09/2015  . Acute and chronic respiratory failure (acute-on-chronic) ([place]) 12/23/2017  . Acute on chronic congestive heart failure ([place])   . AKI (acute kidney injury) ([place]) 05/10/2015  . Atrial fibrillation ([place]) 05/09/2015  . Cancer [facility])    breast cancer  . Cardiomyopathy ([place])    a. 04/2015 Echo: EF 55-60%;  b. 07/2015 Echo: EF 35-40%, mod LVH, antsep DK, mod AI, sev MR, sev dil LA, mildly dil RA, mild-mod TR, PASP 19mmHg-->conservatively managed.  . CHF exacerbation ([place]) 08/02/2015  . Chronic systolic CHF (congestive heart failure) ([place])    a. 07/2015 Echo: EF 35-40%.  . CKD (chronic kidney disease), stage IV ([place])   . CKD (chronic kidney disease), stage V ([place]) 12/23/2017  . Closed left hip fracture, initial encounter ([place]) 11/20/2018  . Congestive dilated  cardiomyopathy ([place]) 08/24/2015  . Congestive heart failure (CHF) ([place]) 08/24/2015  . Diabetes mellitus without complication ([place])   . Dyspnea 08/02/2015  . Elevated lactic acid level 08/02/2015  . Essential hypertension   . Fall 05/09/2015  . History of breast cancer 05/09/2015  . HLD (hyperlipidemia)   . Mitral regurgitation 08/24/2015  . PAF (paroxysmal atrial fibrillation) ([place])    a. diagnosed 04/2015 --> conservative mgmt with rate control and xarelto.  [name] Permanent atrial fibrillation ([place])    a. diagnosed 04/2015 --> conservative mgmt with rate control and xarelto.  [name] Postoperative anemia due to acute blood loss 11/23/2018  . Pulsatile neck mass    a. 11/2015 Neck U/S: pulsatile R neck mass correlates w/ a toruous and mildy ectatic segment of the R SCA measuring ~ 2.1 cm in max diameter - no change since noted on 12/16 CT.  [name] Scapula fracture 05/09/2015  . Severe mitral regurgitation by prior echocardiogram    a. 07/2015 sev MR    Past Surgical History:  Procedure Laterality Date  . ABDOMINAL HYSTERECTOMY    . BREAST LUMPECTOMY    . FRACTURE SURGERY     L femur  . JOINT REPLACEMENT     knees  . ORIF HIP FRACTURE Left 11/21/2018   Procedure: OPEN REDUCTION INTERNAL FIXATION  OF THE LEFT HIP;  Surgeon: Shona Needles, MD;  Location: [facility];  Service: Orthopedics;  Laterality: Left;  Current Medications: Current Meds  Medication Sig  . acetaminophen (TYLENOL) 500 MG tablet Take 1,000 mg by mouth every 6 (six) hours as needed for headache (pain).   [name] apixaban (ELIQUIS) 2.5 MG TABS tablet Take 1 tablet (2.5 mg total) by mouth 2 (two) times daily.  . carvedilol (COREG) 6.25 MG tablet Take 1 tablet (6.25 mg total) by mouth 2 (two) times daily with a meal.  . ferrous sulfate 325 (65 FE) MG tablet Take 325 mg by mouth 2 (two) times daily with a meal.   . hydrALAZINE (APRESOLINE) 25 MG tablet Take 1 tablet (25 mg total) by mouth 3 (three) times daily.  [name] HYDROcodone-acetaminophen  (NORCO/VICODIN) 5-325 MG tablet Take 1 tablet by mouth every 12 (twelve) hours as needed for moderate pain.  . isosorbide mononitrate (IMDUR) 30 MG 24 hr tablet Take 1 tablet (30 mg total) by mouth daily.  [name] LORazepam (ATIVAN) 0.5 MG tablet Take 0.5 mg by mouth at bedtime.   . LUTEIN PO Take 25 mg by mouth daily.   . nitroGLYCERIN (NITROSTAT) 0.4 MG SL tablet SMARTSIG:Tablet(s) Sublingual As Directed  . OXYGEN Inhale 3 L into the lungs See admin instructions. Use every night at bedtime and during the day if needed for shortness of breath  . polyethylene glycol (MIRALAX / GLYCOLAX) packet Take 17 g by mouth daily.   . pregabalin (LYRICA) 50 MG capsule Take 50 mg by mouth daily.  [name] Resveratrol 250 MG CAPS Take 250 mg by mouth daily.  . saxagliptin HCl (ONGLYZA) 2.5 MG TABS tablet Take 2.5 mg by mouth daily.   . sodium bicarbonate 650 MG tablet Take 1 tablet (650 mg total) by mouth 3 (three) times daily.  . sodium chloride (MURO 128) 5 % ophthalmic solution Place 1 drop into the left eye 4 (four) times daily.  . sodium chloride (OCEAN) 0.65 % SOLN nasal spray Place 1 spray into both nostrils as needed for congestion.  . torsemide (DEMADEX) 20 MG tablet Take 2 tablets (40 mg total) by mouth daily.  . Vitamin D, Ergocalciferol, (DRISDOL) 1.25 MG (50000 UNIT) CAPS capsule Take 1 capsule (50,000 Units total) by mouth every 7 (seven) days.     Allergies:   Codeine, Neurontin [gabapentin], and Oxycodone   Social History   Socioeconomic History  . Marital status: Widowed    Spouse name: Not on file  . Number of children: Not on file  . Years of education: Not on file  . Highest education level: Not on file  Occupational History  . Not on file  Tobacco Use  . Smoking status: Never Smoker  . Smokeless tobacco: Never Used  Vaping Use  . Vaping Use: Never used  Substance and Sexual Activity  . Alcohol use: No    Alcohol/week: 0.0 standard drinks  . Drug use: Not Currently  . Sexual activity:  Never  Other Topics Concern  . Not on file  Social History Narrative  . Not on file   Social Determinants of Health   Financial Resource Strain:   . Difficulty of Paying Living Expenses:   Food Insecurity:   . Worried About Charity fundraiser in the Last Year:   . Arboriculturist in the Last Year:   Transportation Needs: No Transportation Needs  . Lack of Transportation (Medical): No  . Lack of Transportation (Non-Medical): No  Physical Activity:   . Days of Exercise per Week:   . Minutes of Exercise per Session:   Stress:   .  Feeling of Stress :   Social Connections: Unknown  . Frequency of Communication with Friends and Family: More than three times a week  . Frequency of Social Gatherings with Friends and Family: More than three times a week  . Attends Religious Services: Not on file  . Active Member of Clubs or Organizations: Not on file  . Attends Archivist Meetings: Not on file  . Marital Status: Not on file     Family History: The patient's family history includes Coronary artery disease in her father; Stroke in her mother.  ROS:   Please see the history of present illness.     All other systems reviewed and are negative.  EKGs/Labs/Other Studies Reviewed:    The following studies were reviewed today:  Echo 11/19/2019- IMPRESSIONS    1. Left ventricular ejection fraction, by estimation, is 40 to 45%. The  left ventricle has mildly decreased function. The left ventricle  demonstrates global hypokinesis. Left ventricular diastolic function could  not be evaluated.  2. Right ventricular systolic function is mildly reduced. The right  ventricular size is mildly enlarged. There is moderately elevated  pulmonary artery systolic pressure. The estimated right ventricular  systolic pressure is 14.4 mmHg.  3. Left atrial size was severely dilated.  4. Right atrial size was severely dilated.  5. The mitral valve is normal in structure. Mild mitral  valve  regurgitation. No evidence of mitral stenosis.  6. Tricuspid valve regurgitation is moderate to severe.  7. The aortic valve is normal in structure. Aortic valve regurgitation is  moderate. No aortic stenosis is present.  8. Aortic dilatation noted. There is moderate to severe dilatation of the  ascending aorta measuring 46 mm.   EKG:  EKG is not ordered today.  The ekg ordered 11/18/2019 demonstrates AF with LBBB, rate 80  Recent Labs: 11/20/2019: B Natriuretic Peptide 911.9; Hemoglobin 10.4; Platelets 235 11/21/2019: BUN 105; Creatinine, Ser 3.07; Magnesium 2.3; Potassium 3.9; Sodium 141  Recent Lipid Panel No results found for: CHOL, TRIG, HDL, CHOLHDL, VLDL, LDLCALC, LDLDIRECT  Physical Exam:    VS:  BP 135/66   Pulse 74   Ht 5\' 3"  (1.6 m)   Wt 138 lb (62.6 kg)   SpO2 90%   BMI 24.45 kg/m     Wt Readings from Last 3 Encounters:  12/04/19 138 lb (62.6 kg)  11/21/19 94 lb 5.7 oz (42.8 kg)  10/16/19 132 lb (59.9 kg)     GEN:  Elderly, frail Caucasian female ina wheel chair,  in no acute distress HEENT: Normal NECK: No JVD CARDIAC: irregularly irregular, no murmurs, rubs, gallops RESPIRATORY:  Decreased breath sounds 1/3 up on Lt, clear on Rt  ABDOMEN: non-distended MUSCULOSKELETAL:  No edema; No deformity  SKIN: Warm and dry NEUROLOGIC:  Alert and oriented x 3 PSYCHIATRIC:  Normal affect   ASSESSMENT:    Chronic combined systolic and diastolic CHF (congestive heart failure) (HCC) She appears compensated on exam.  The family has instructions on adjusting her diuretics based on her weight  Permanent atrial fibrillation (HCC) Conservative mgmt with rate control and Eliquis.  CKD (chronic kidney disease), stage V ([place]) BUN/SCr done 11/26/2019- 86/2.87 GFR 13.  She is followed at [facility]  PLAN:    The patient appears stable though she is chronically ill.  She has indicated her desire to avoid further hospitalizations and does not want dialysis.  Home  Hospice is involved.  No change in her current medications- no labs ordered today.  She has a f/u with Dr Stanford Breed in Oct and will keep that.   Medication Adjustments/Labs and Tests Ordered: Current medicines are reviewed at length with the patient today.  Concerns regarding medicines are outlined above.  No orders of the defined types were placed in this encounter.  No orders of the defined types were placed in this encounter.   Patient Instructions  Medication Instructions:  Your physician recommends that you continue on your current medications as directed. Please refer to the Current Medication list given to you today.  *If you need a refill on your cardiac medications before your next appointment, please call your pharmacy*  Lab Work: NONE ordered at this time of appointment   If you have labs (blood work) drawn today and your tests are completely normal, you will receive your results only by: [name] MyChart Message (if you have MyChart) OR . A paper copy in the mail If you have any lab test that is abnormal or we need to change your treatment, we will call you to review the results.  Testing/Procedures: NONE ordered at this time of appointment   Follow-Up: At [facility], you and your health needs are our priority.  As part of our continuing mission to provide you with exceptional heart care, we have created designated Provider Care Teams.  These Care Teams include your primary Cardiologist (physician) and Advanced Practice Providers (APPs -  Physician Assistants and Nurse Practitioners) who all work together to provide you with the care you need, when you need it.  Your next appointment:   As scheduled    The format for your next appointment:   In Person  Provider:   Kirk Ruths, MD  Other Instructions      Signed, Kerin Ransom, PA-C  12/04/2019 2:46 PM    [place]

## 2019-12-06 ENCOUNTER — Encounter: Payer: Self-pay | Admitting: Orthopedic Surgery

## 2019-12-06 NOTE — Progress Notes (Signed)
Office Visit Note   Patient: [name]           Date of Birth: 07/20/1925           MRN: [id number] Visit Date: 12/03/2019 Requested by: Bernerd Limbo, MD [address] [place],  [place] [id number] PCP: Bernerd Limbo, MD  Subjective: Chief Complaint  Patient presents with  . Right Ankle - Follow-up    HPI: [name] is a 86 year old patient with stress reaction in the ankle on the right-hand side.  She is doing well.  She has been in a fracture boot.  Has no complaints of pain.  Swelling is also diminished.              ROS: All systems reviewed are negative as they relate to the chief complaint within the history of present illness.  Patient denies  fevers or chills.   Assessment & Plan: Visit Diagnoses:  1. Stress fracture of right ankle, initial encounter     Plan: Impression is tibial and fibular stress fracture with abundant callus formation noted on radiographs taken today.  No real tenderness.  Ankle range of motion is intact.  For her level of activity I think she is fine to be weightbearing as tolerated.  I do not think she needs the fracture boot.  She does have help and assist at home.  Follow-up with [place] as needed.  Follow-Up Instructions: Return if symptoms worsen or fail to improve.   Orders:  Orders Placed This Encounter  Procedures  . XR Ankle Complete Right   No orders of the defined types were placed in this encounter.     Procedures: No procedures performed   Clinical Data: No additional findings.  Objective: Vital Signs: There were no vitals taken for this visit.  Physical Exam:   Constitutional: Patient appears well-developed HEENT:  Head: Normocephalic Eyes:EOM are normal Neck: Normal range of motion Cardiovascular: Normal rate Pulmonary/chest: Effort normal Neurologic: Patient is alert Skin: Skin is warm Psychiatric: Patient has normal mood and affect    Ortho Exam: Ortho exam demonstrates no real tenderness to  palpation of the distal tibia or fibula on the right-hand side.  Negative calf tenderness and negative Homans.  Ankle dorsiflexion plantarflexion intact.  Pedal pulses palpable.  No masses lymphadenopathy or skin changes noted in that right ankle region.  Specialty Comments:  No specialty comments available.  Imaging: No results found.   PMFS History: Patient Active Problem List   Diagnosis Date Noted  . Chronic combined systolic and diastolic CHF (congestive heart failure) ([place])   . Acute renal failure superimposed on stage 4 chronic kidney disease ([place])   . Chronic a-fib ([place])   . Acidosis   . DM (diabetes mellitus), type 2 with complications ([place])   . CHF (congestive heart failure) ([place]) 11/18/2019  . Renal dysfunction   . Postoperative anemia due to acute blood loss 11/23/2018  . Closed left hip fracture, initial encounter ([place]) 11/20/2018  . Acute and chronic respiratory failure (acute-on-chronic) ([place]) 12/23/2017  . CKD (chronic kidney disease), stage V ([place]) 12/23/2017  . Essential hypertension   . HLD (hyperlipidemia)   . Permanent atrial fibrillation ([place])   . Cardiomyopathy ([place])   . Mitral regurgitation 08/24/2015  . Congestive dilated cardiomyopathy ([place]) 08/24/2015  . Congestive heart failure (CHF) ([place]) 08/24/2015  . Acute on chronic congestive heart failure ([place])   . Dyspnea 08/02/2015  . AKI (acute kidney injury) ([place]) 05/10/2015  .  Rib fractures -  right #8-10 05/09/2015  . Atrial fibrillation ([place]) 05/09/2015  . Scapula fracture 05/09/2015  . Fall 05/09/2015  . Essential hypertension, benign 05/09/2015  . Diabetes mellitus without complication ([place]) 27/25/3664  . History of breast cancer 05/09/2015   Past Medical History:  Diagnosis Date  .  Rib fractures - right #8-10 05/09/2015  . Acute and chronic respiratory failure (acute-on-chronic) ([place]) 12/23/2017  . Acute on chronic congestive heart failure ([place])   . AKI (acute kidney injury) ([place]) 05/10/2015    . Atrial fibrillation ([place]) 05/09/2015  . Cancer [facility])    breast cancer  . Cardiomyopathy ([place])    a. 04/2015 Echo: EF 55-60%;  b. 07/2015 Echo: EF 35-40%, mod LVH, antsep DK, mod AI, sev MR, sev dil LA, mildly dil RA, mild-mod TR, PASP 34mmHg-->conservatively managed.  . CHF exacerbation ([place]) 08/02/2015  . Chronic systolic CHF (congestive heart failure) ([place])    a. 07/2015 Echo: EF 35-40%.  . CKD (chronic kidney disease), stage IV ([place])   . CKD (chronic kidney disease), stage V ([place]) 12/23/2017  . Closed left hip fracture, initial encounter ([place]) 11/20/2018  . Congestive dilated cardiomyopathy ([place]) 08/24/2015  . Congestive heart failure (CHF) ([place]) 08/24/2015  . Diabetes mellitus without complication ([place])   . Dyspnea 08/02/2015  . Elevated lactic acid level 08/02/2015  . Essential hypertension   . Fall 05/09/2015  . History of breast cancer 05/09/2015  . HLD (hyperlipidemia)   . Mitral regurgitation 08/24/2015  . PAF (paroxysmal atrial fibrillation) ([place])    a. diagnosed 04/2015 --> conservative mgmt with rate control and xarelto.  [name] Permanent atrial fibrillation ([place])    a. diagnosed 04/2015 --> conservative mgmt with rate control and xarelto.  [name] Postoperative anemia due to acute blood loss 11/23/2018  . Pulsatile neck mass    a. 11/2015 Neck U/S: pulsatile R neck mass correlates w/ a toruous and mildy ectatic segment of the R SCA measuring ~ 2.1 cm in max diameter - no change since noted on 12/16 CT.  [name] Scapula fracture 05/09/2015  . Severe mitral regurgitation by prior echocardiogram    a. 07/2015 sev MR    Family History  Problem Relation Age of Onset  . Coronary artery disease Father   . Stroke Mother     Past Surgical History:  Procedure Laterality Date  . ABDOMINAL HYSTERECTOMY    . BREAST LUMPECTOMY    . FRACTURE SURGERY     L femur  . JOINT REPLACEMENT     knees  . ORIF HIP FRACTURE Left 11/21/2018   Procedure: OPEN REDUCTION INTERNAL FIXATION  OF THE LEFT HIP;  Surgeon:  Shona Needles, MD;  Location: [facility];  Service: Orthopedics;  Laterality: Left;   Social History   Occupational History  . Not on file  Tobacco Use  . Smoking status: Never Smoker  . Smokeless tobacco: Never Used  Vaping Use  . Vaping Use: Never used  Substance and Sexual Activity  . Alcohol use: No    Alcohol/week: 0.0 standard drinks  . Drug use: Not Currently  . Sexual activity: Never

## 2019-12-12 ENCOUNTER — Other Ambulatory Visit: Payer: Self-pay | Admitting: Cardiology

## 2019-12-12 MED ORDER — APIXABAN 2.5 MG PO TABS: 2.5000 mg | ORAL_TABLET | Freq: Two times a day (BID) | ORAL | 3 refills | Status: DC

## 2019-12-12 NOTE — Telephone Encounter (Signed)
New Message   *STAT* If patient is at the pharmacy, call can be transferred to refill team.   1. Which medications need to be refilled? (please list name of each medication and dose if known) apixaban (ELIQUIS) 2.5 MG TABS tablet  2. Which pharmacy/location (including street and city if local pharmacy) is medication to be sent to? [facility] - [place], [place] [address]  [place], [place] [address]   3. Do they need a 30 day or 90 day supply? 90 day

## 2019-12-19 ENCOUNTER — Telehealth: Payer: Self-pay | Admitting: Cardiology

## 2019-12-19 MED ORDER — APIXABAN 2.5 MG PO TABS: 2.5000 mg | ORAL_TABLET | Freq: Two times a day (BID) | ORAL | 1 refills | Status: AC

## 2019-12-19 NOTE — Telephone Encounter (Signed)
86yo Female Scr =  3.0 Wt = 62.6kg  AFib

## 2019-12-19 NOTE — Telephone Encounter (Signed)
*  STAT* If patient is at the pharmacy, call can be transferred to refill team.   1. Which medications need to be refilled? (please list name of each medication and dose if known)   apixaban (ELIQUIS) 2.5 MG TABS tablet   2. Which pharmacy/location (including street and city if local pharmacy) is medication to be sent to? [facility]  3. Do they need a 30 day or 90 day supply? [place]

## 2019-12-19 NOTE — Addendum Note (Signed)
Addended by: Harrington Challenger on: 12/19/2019 03:34 PM   Modules accepted: Orders

## 2019-12-23 ENCOUNTER — Other Ambulatory Visit: Payer: Self-pay | Admitting: Surgical

## 2019-12-23 NOTE — Telephone Encounter (Signed)
Pls advise.  

## 2019-12-23 NOTE — Telephone Encounter (Signed)
This is a GD pt

## 2019-12-24 NOTE — Telephone Encounter (Signed)
Does not need large dose for supplementation at this point, can just transition to OTC supplementation.

## 2020-01-19 ENCOUNTER — Ambulatory Visit: Payer: Medicare Other | Admitting: Podiatry

## 2020-01-28 DEATH — deceased

## 2020-02-19 NOTE — Progress Notes (Deleted)
HPI: Follow-up atrial fibrillation and congestive heart failure. Had fall 12/16. She had rib fractures and fracture of her right scapula. She was treated conservatively. She was noted to be in atrial fibrillation at that time. Echocardiogram revealed Ejection fraction 55-60%, mild aortic insufficiency and mild mitral regurgitation. She was treated with rate control and discharged. Reeadmitted 3/17 with CHF. Echocardiogram repeated and showed ejection fraction 35-40%, biatrial enlargement, moderate aortic insufficiency, severe mitral regurgitation and mild to moderate tricuspid regurgitation. Patienthas requested only conservative measures given her age which we thought was appropriate. She also has renal insufficiency.Patient fell June 2020and suffered left hip fracture requiring surgical intervention.  Echocardiogram June 2021 showed ejection fraction 40 to 45%, mild RV dysfunction, severe biatrial enlargement, mild mitral regurgitation, moderate to severe tricuspid regurgitation, moderate aortic insufficiency and dilated ascending aorta measuring 46 mm.  Since last seen,  Current Outpatient Medications  Medication Sig Dispense Refill  . acetaminophen (TYLENOL) 500 MG tablet Take 1,000 mg by mouth every 6 (six) hours as needed for headache (pain).     [name] apixaban (ELIQUIS) 2.5 MG TABS tablet Take 1 tablet (2.5 mg total) by mouth 2 (two) times daily. 180 tablet 1  . carvedilol (COREG) 6.25 MG tablet Take 1 tablet (6.25 mg total) by mouth 2 (two) times daily with a meal. 180 tablet 3  . ferrous sulfate 325 (65 FE) MG tablet Take 325 mg by mouth 2 (two) times daily with a meal.     . hydrALAZINE (APRESOLINE) 25 MG tablet Take 1 tablet (25 mg total) by mouth 3 (three) times daily. 90 tablet 1  . HYDROcodone-acetaminophen (NORCO/VICODIN) 5-325 MG tablet Take 1 tablet by mouth every 12 (twelve) hours as needed for moderate pain. 20 tablet 0  . isosorbide mononitrate (IMDUR) 30 MG 24 hr tablet Take  1 tablet (30 mg total) by mouth daily. 90 tablet 3  . LORazepam (ATIVAN) 0.5 MG tablet Take 0.5 mg by mouth at bedtime.   0  . LUTEIN PO Take 25 mg by mouth daily.     . nitroGLYCERIN (NITROSTAT) 0.4 MG SL tablet SMARTSIG:Tablet(s) Sublingual As Directed    . OXYGEN Inhale 3 L into the lungs See admin instructions. Use every night at bedtime and during the day if needed for shortness of breath    . polyethylene glycol (MIRALAX / GLYCOLAX) packet Take 17 g by mouth daily.     . pregabalin (LYRICA) 50 MG capsule Take 50 mg by mouth daily.    [name] Resveratrol 250 MG CAPS Take 250 mg by mouth daily.    . saxagliptin HCl (ONGLYZA) 2.5 MG TABS tablet Take 2.5 mg by mouth daily.     . sodium bicarbonate 650 MG tablet Take 1 tablet (650 mg total) by mouth 3 (three) times daily. 90 tablet 1  . sodium chloride (MURO 128) 5 % ophthalmic solution Place 1 drop into the left eye 4 (four) times daily.    . sodium chloride (OCEAN) 0.65 % SOLN nasal spray Place 1 spray into both nostrils as needed for congestion.    . torsemide (DEMADEX) 20 MG tablet Take 2 tablets (40 mg total) by mouth daily. 60 tablet 1  . Vitamin D, Ergocalciferol, (DRISDOL) 1.25 MG (50000 UNIT) CAPS capsule Take 1 capsule (50,000 Units total) by mouth every 7 (seven) days. 6 capsule 0   No current facility-administered medications for this visit.     Past Medical History:  Diagnosis Date  .  Rib fractures -  right #8-10 05/09/2015  . Acute and chronic respiratory failure (acute-on-chronic) ([place]) 12/23/2017  . Acute on chronic congestive heart failure ([place])   . AKI (acute kidney injury) ([place]) 05/10/2015  . Atrial fibrillation ([place]) 05/09/2015  . Cancer [facility])    breast cancer  . Cardiomyopathy ([place])    a. 04/2015 Echo: EF 55-60%;  b. 07/2015 Echo: EF 35-40%, mod LVH, antsep DK, mod AI, sev MR, sev dil LA, mildly dil RA, mild-mod TR, PASP 67mmHg-->conservatively managed.  . CHF exacerbation ([place]) 08/02/2015  . Chronic systolic CHF  (congestive heart failure) ([place])    a. 07/2015 Echo: EF 35-40%.  . CKD (chronic kidney disease), stage IV ([place])   . CKD (chronic kidney disease), stage V ([place]) 12/23/2017  . Closed left hip fracture, initial encounter ([place]) 11/20/2018  . Congestive dilated cardiomyopathy ([place]) 08/24/2015  . Congestive heart failure (CHF) ([place]) 08/24/2015  . Diabetes mellitus without complication ([place])   . Dyspnea 08/02/2015  . Elevated lactic acid level 08/02/2015  . Essential hypertension   . Fall 05/09/2015  . History of breast cancer 05/09/2015  . HLD (hyperlipidemia)   . Mitral regurgitation 08/24/2015  . PAF (paroxysmal atrial fibrillation) ([place])    a. diagnosed 04/2015 --> conservative mgmt with rate control and xarelto.  [name] Permanent atrial fibrillation ([place])    a. diagnosed 04/2015 --> conservative mgmt with rate control and xarelto.  [name] Postoperative anemia due to acute blood loss 11/23/2018  . Pulsatile neck mass    a. 11/2015 Neck U/S: pulsatile R neck mass correlates w/ a toruous and mildy ectatic segment of the R SCA measuring ~ 2.1 cm in max diameter - no change since noted on 12/16 CT.  [name] Scapula fracture 05/09/2015  . Severe mitral regurgitation by prior echocardiogram    a. 07/2015 sev MR    Past Surgical History:  Procedure Laterality Date  . ABDOMINAL HYSTERECTOMY    . BREAST LUMPECTOMY    . FRACTURE SURGERY     L femur  . JOINT REPLACEMENT     knees  . ORIF HIP FRACTURE Left 11/21/2018   Procedure: OPEN REDUCTION INTERNAL FIXATION  OF THE LEFT HIP;  Surgeon: Shona Needles, MD;  Location: [facility];  Service: Orthopedics;  Laterality: Left;    Social History   Socioeconomic History  . Marital status: Widowed    Spouse name: Not on file  . Number of children: Not on file  . Years of education: Not on file  . Highest education level: Not on file  Occupational History  . Not on file  Tobacco Use  . Smoking status: Never Smoker  . Smokeless tobacco: Never Used  Vaping Use  . Vaping  Use: Never used  Substance and Sexual Activity  . Alcohol use: No    Alcohol/week: 0.0 standard drinks  . Drug use: Not Currently  . Sexual activity: Never  Other Topics Concern  . Not on file  Social History Narrative  . Not on file   Social Determinants of Health   Financial Resource Strain:   . Difficulty of Paying Living Expenses: Not on file  Food Insecurity:   . Worried About Charity fundraiser in the Last Year: Not on file  . Ran Out of Food in the Last Year: Not on file  Transportation Needs: No Transportation Needs  . Lack of Transportation (Medical): No  . Lack of Transportation (Non-Medical): No  Physical Activity:   . Days of Exercise per Week: Not on file  .  Minutes of Exercise per Session: Not on file  Stress:   . Feeling of Stress : Not on file  Social Connections: Unknown  . Frequency of Communication with Friends and Family: More than three times a week  . Frequency of Social Gatherings with Friends and Family: More than three times a week  . Attends Religious Services: Not on file  . Active Member of Clubs or Organizations: Not on file  . Attends Archivist Meetings: Not on file  . Marital Status: Not on file  Intimate Partner Violence:   . Fear of Current or Ex-Partner: Not on file  . Emotionally Abused: Not on file  . Physically Abused: Not on file  . Sexually Abused: Not on file    Family History  Problem Relation Age of Onset  . Coronary artery disease Father   . Stroke Mother     ROS: no fevers or chills, productive cough, hemoptysis, dysphasia, odynophagia, melena, hematochezia, dysuria, hematuria, rash, seizure activity, orthopnea, PND, pedal edema, claudication. Remaining systems are negative.  Physical Exam: Well-developed well-nourished in no acute distress.  Skin is warm and dry.  HEENT is normal.  Neck is supple.  Chest is clear to auscultation with normal expansion.  Cardiovascular exam is regular rate and rhythm.    Abdominal exam nontender or distended. No masses palpated. Extremities show no edema. neuro grossly intact  ECG- personally reviewed  A/P  1 chronic combined systolic/diastolic congestive heart failure-patient doing reasonably well from a volume standpoint.  Continue Demadex at present dose.  Check potassium and renal function.  Continue fluid restriction and low-sodium diet.  2 cardiomyopathy-as outlined in previous notes patient requests only medical therapy and does not want aggressive procedures.  Continue hydralazine/nitrates and beta-blocker.  She is not on Entresto or an ARB due to renal insufficiency.  3 permanent atrial fibrillation-continue Cardizem and carvedilol for rate control.  Continue apixaban.  4 chronic stage IV kidney disease-managed by nephrology.  5 mitral regurgitation-mild on most recent echocardiogram.  6 thoracic aortic aneurysm-we will not do follow-up imaging as she would not be a candidate for aortic root replacement nor which she want to be aggressive.  7 patient is a no CODE BLUE.  Kirk Ruths, MD

## 2020-02-27 ENCOUNTER — Ambulatory Visit: Payer: Medicare Other | Admitting: Cardiology

## 2020-09-13 IMAGING — CT CT HEAD WITHOUT CONTRAST
3 of 4 series · 13 of 47 positions shown, 15 images · non-contrast
Comparison: Head CT 05/09/2015.

CLINICAL DATA: [AGE] female with history of minor head
trauma.

EXAM:
CT HEAD WITHOUT CONTRAST
TECHNIQUE: Contiguous axial images were obtained from the base of the skull
through the vertex without intravenous contrast.

[Series 3: head without · axial · non-contrast · 0.49mm/px · z∈[-154,-14]mm · 7 of 38 slices shown, 9 images]
[im 5/38  brain]
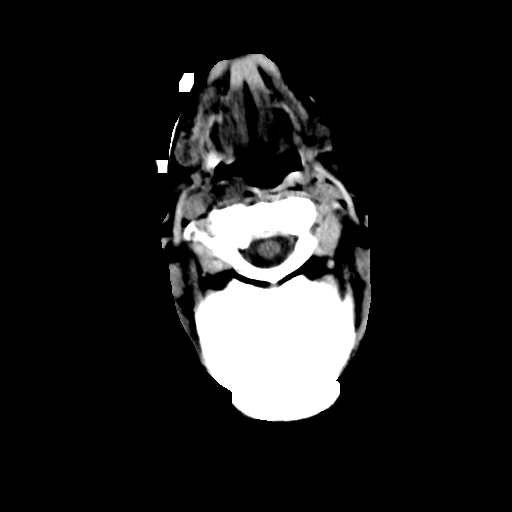
[im 5/38  bone]
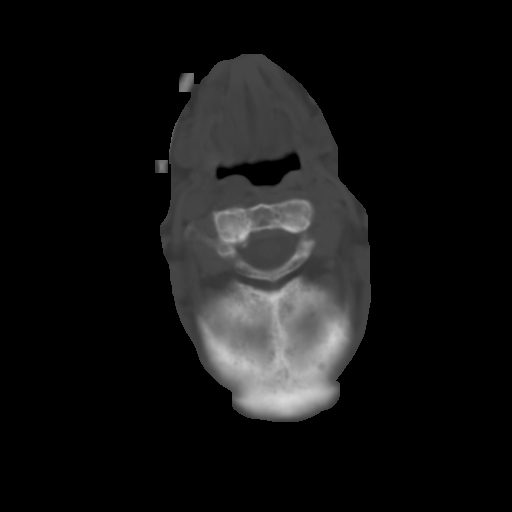
[im 10/38  brain]
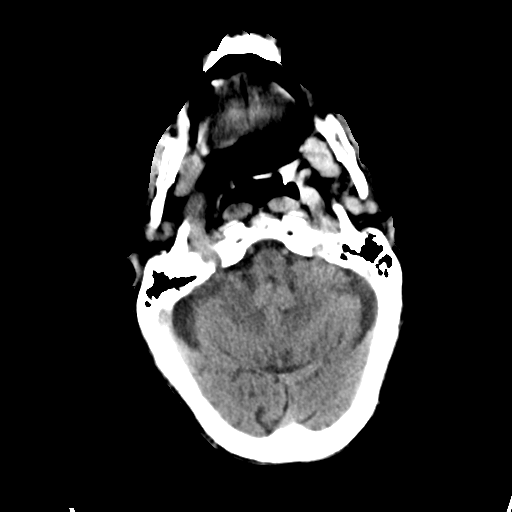
[im 14/38  brain]
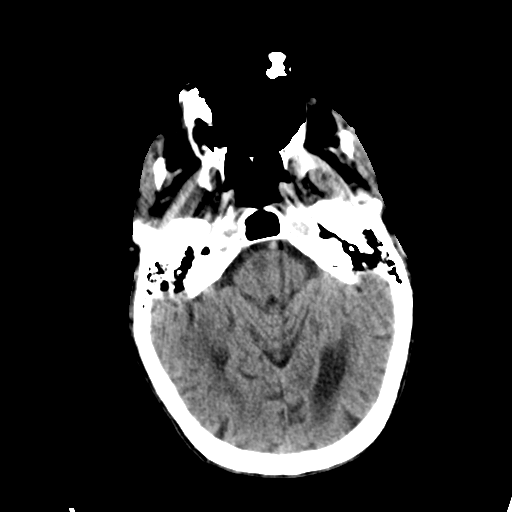
[im 19/38  brain]
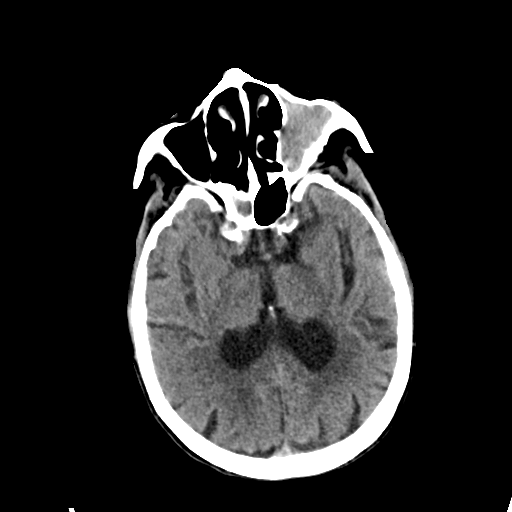
[im 24/38  brain]
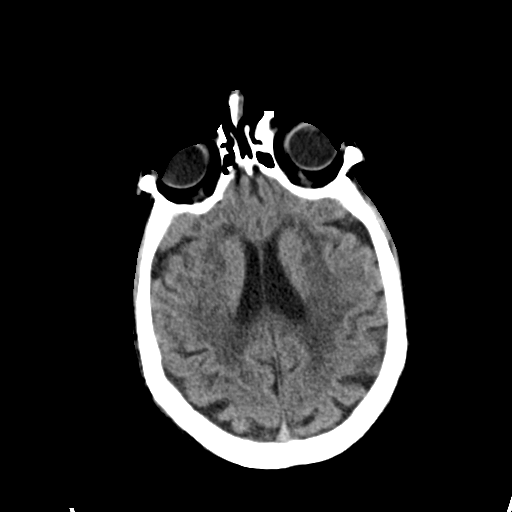
[im 24/38  bone]
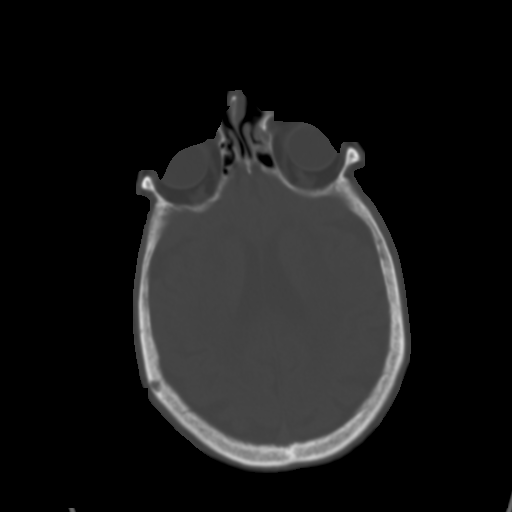
[im 28/38  brain]
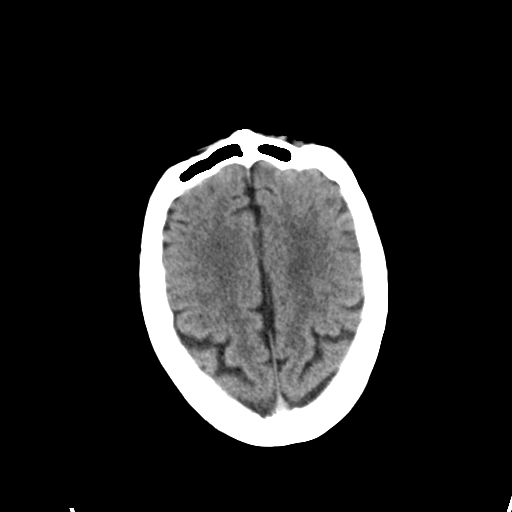
[im 33/38  brain]
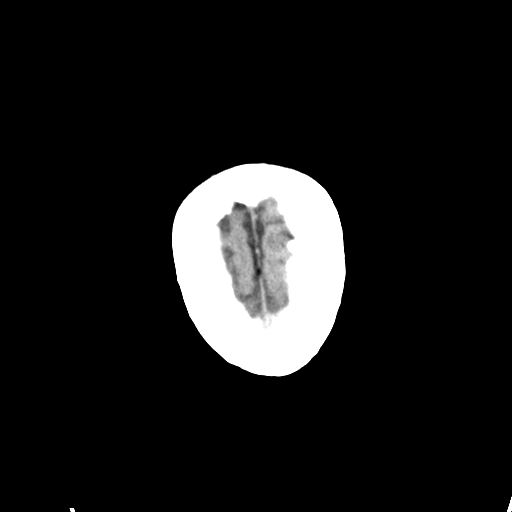

[Series 5: head without cor · coronal · non-contrast · 0.37mm/px · 3 of 78 slices shown]
[im 26/78  brain]
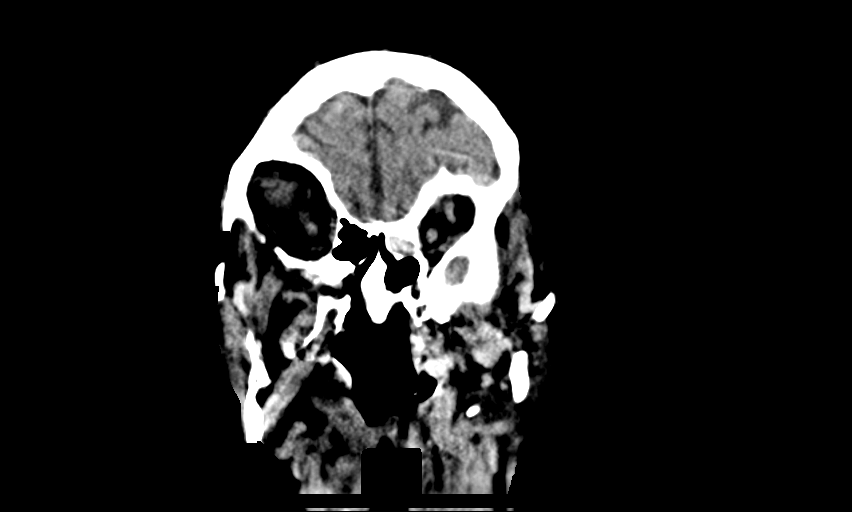
[im 35/78  brain]
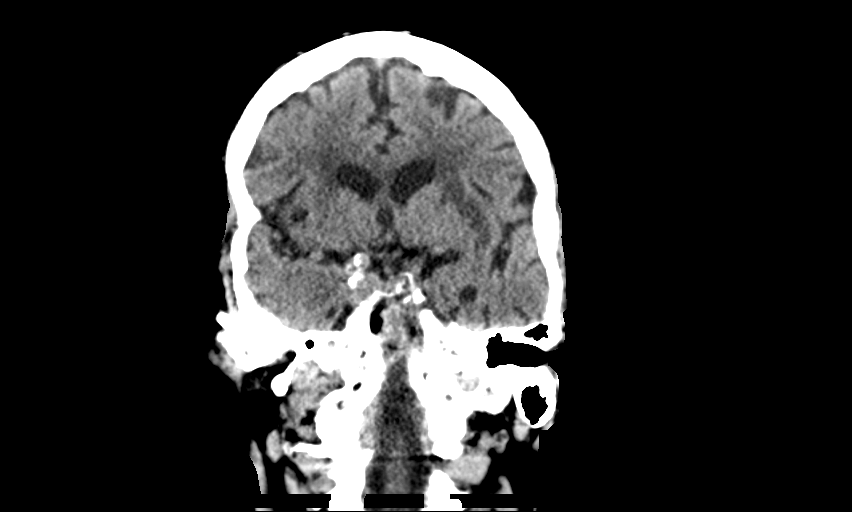
[im 43/78  brain]
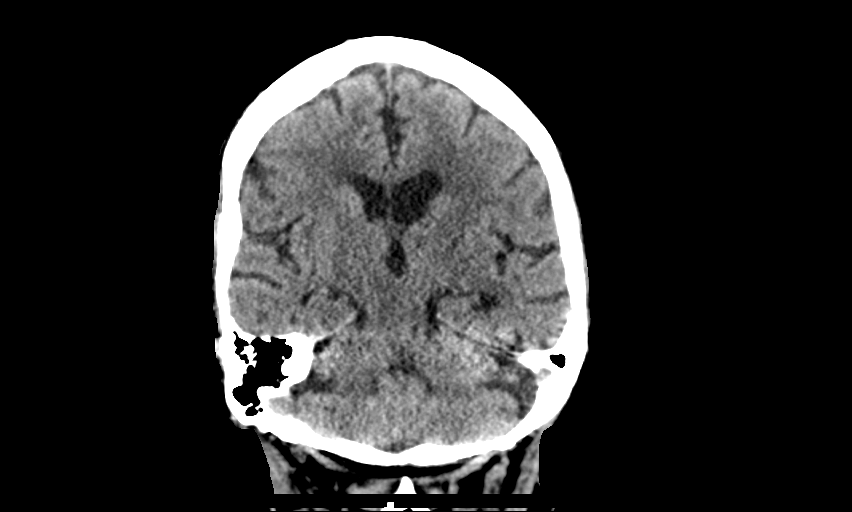

[Series 6: head without sag · sagittal · non-contrast · 0.37mm/px · 3 of 64 slices shown]
[im 22/64  brain]
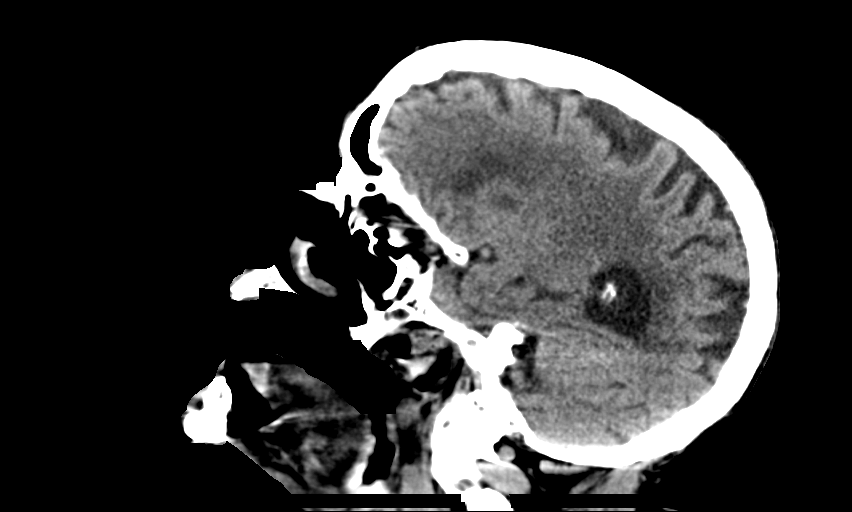
[im 32/64  brain]
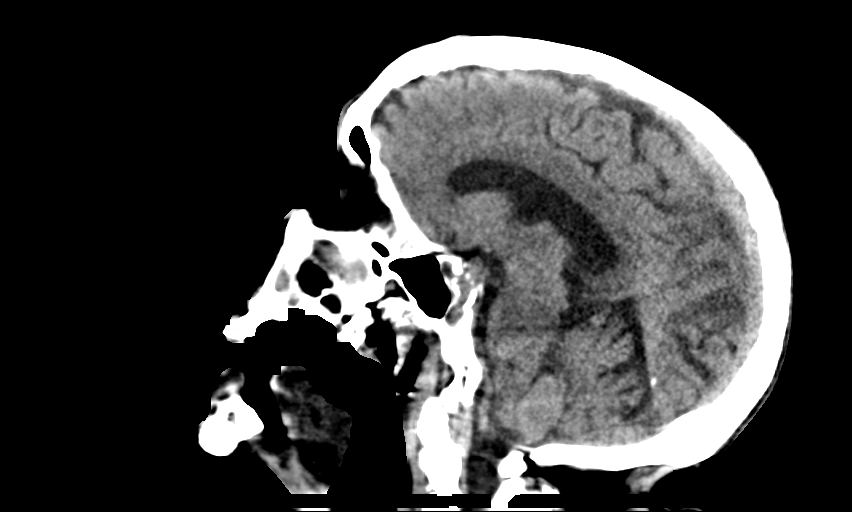
[im 43/64  brain]
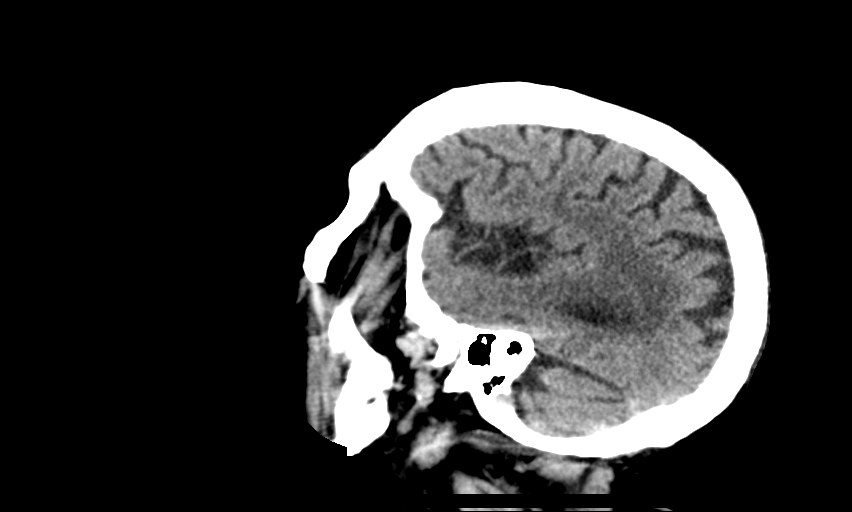

[13 of 47 positions shown; findings below may reference images not displayed]

FINDINGS: Brain: Mild cerebral atrophy. Patchy and confluent areas of
decreased attenuation are noted throughout the deep and
periventricular white matter of the cerebral hemispheres
bilaterally, compatible with chronic microvascular ischemic disease.
No evidence of acute infarction, hemorrhage, hydrocephalus,
extra-axial collection or mass lesion/mass effect.

Vascular: No hyperdense vessel or unexpected calcification.

Skull: Normal. Negative for fracture or focal lesion.

Sinuses/Orbits: Complete opacification of the left maxillary sinus
and right sphenoid sinus where there is also extensive
mucoperiosteal thickening, compatible with chronic sinusitis.
Multifocal opacification of the left ethmoid sinuses also noted. No
air-fluid levels. Orbits are unremarkable in appearance.

Other: None.
IMPRESSION: 1. No acute displaced skull fractures or findings to suggest
significant acute intracranial trauma.
2. Mild cerebral atrophy.
3. Chronic microvascular ischemic changes in the cerebral white
matter, as above.

## 2020-09-13 IMAGING — DX DG HIP (WITH OR WITHOUT PELVIS) 2-3V LEFT
2 series · 2 of 2 positions shown · non-contrast
Comparison: 04/08/2006

CLINICAL DATA: Pain post fall

EXAM:
DG HIP (WITH OR WITHOUT PELVIS) 2-3V LEFT

[t pelvis ap]
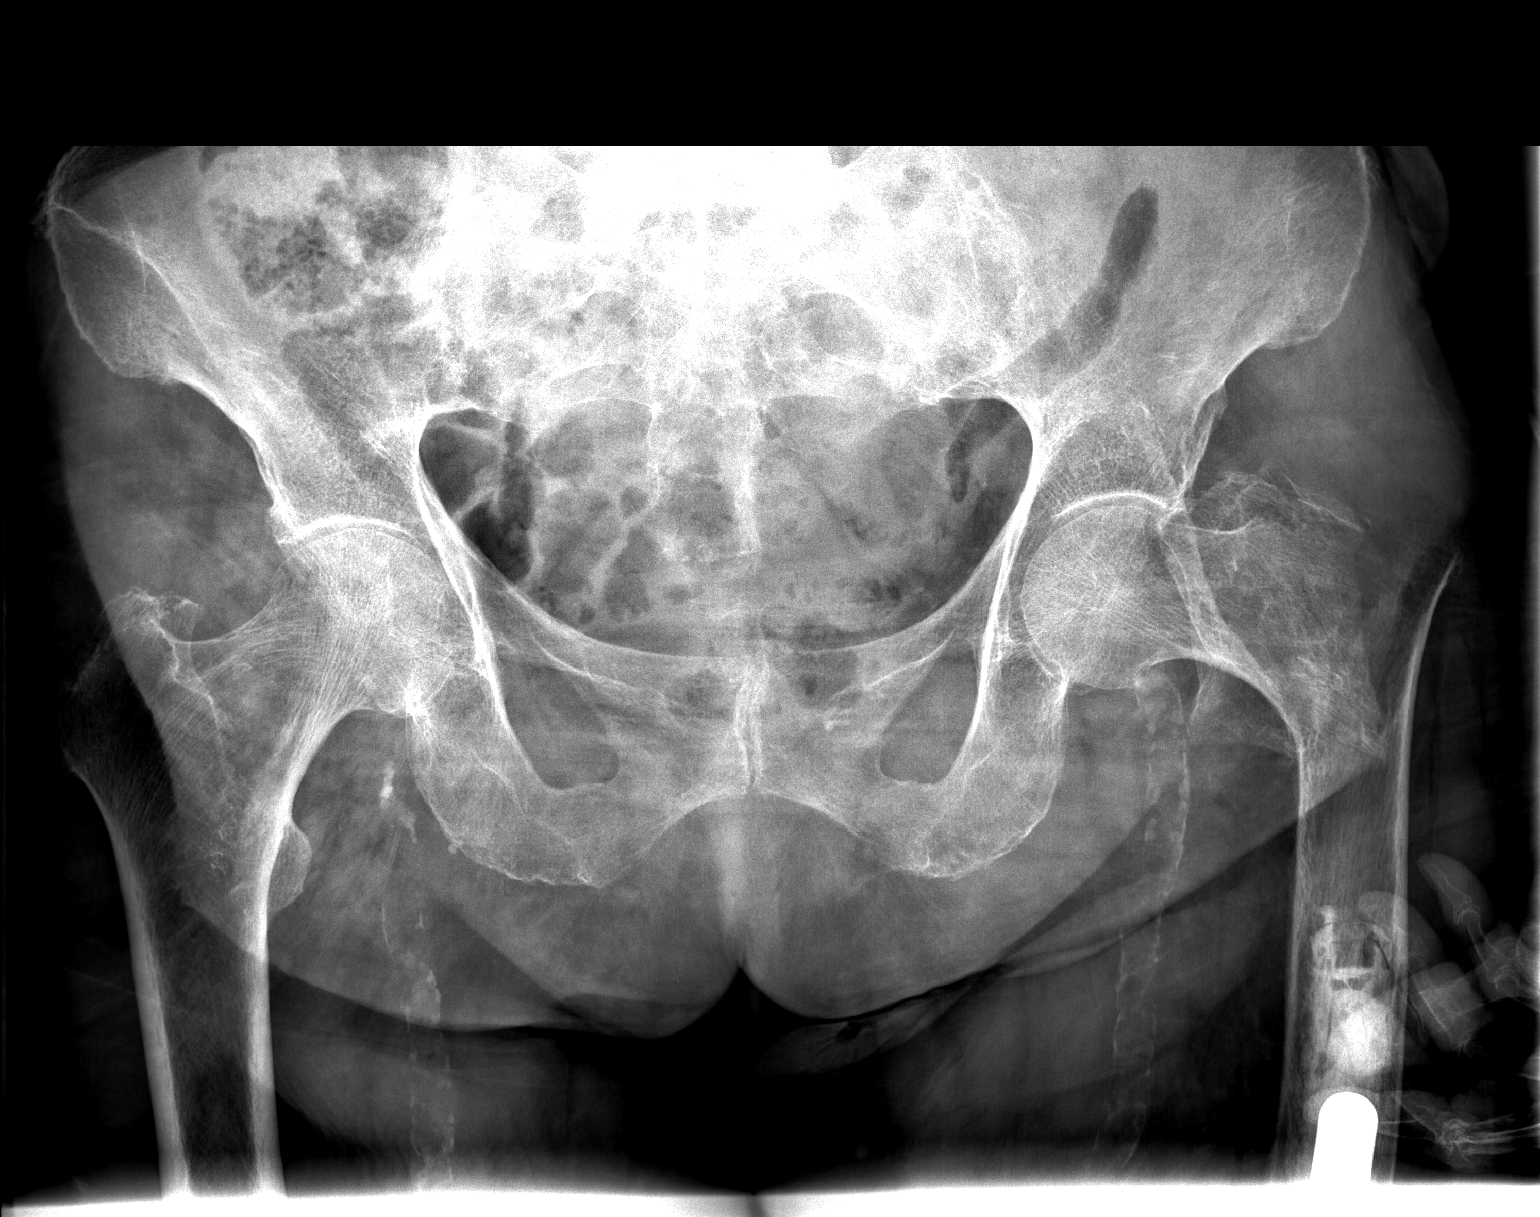

[t hip ap left]
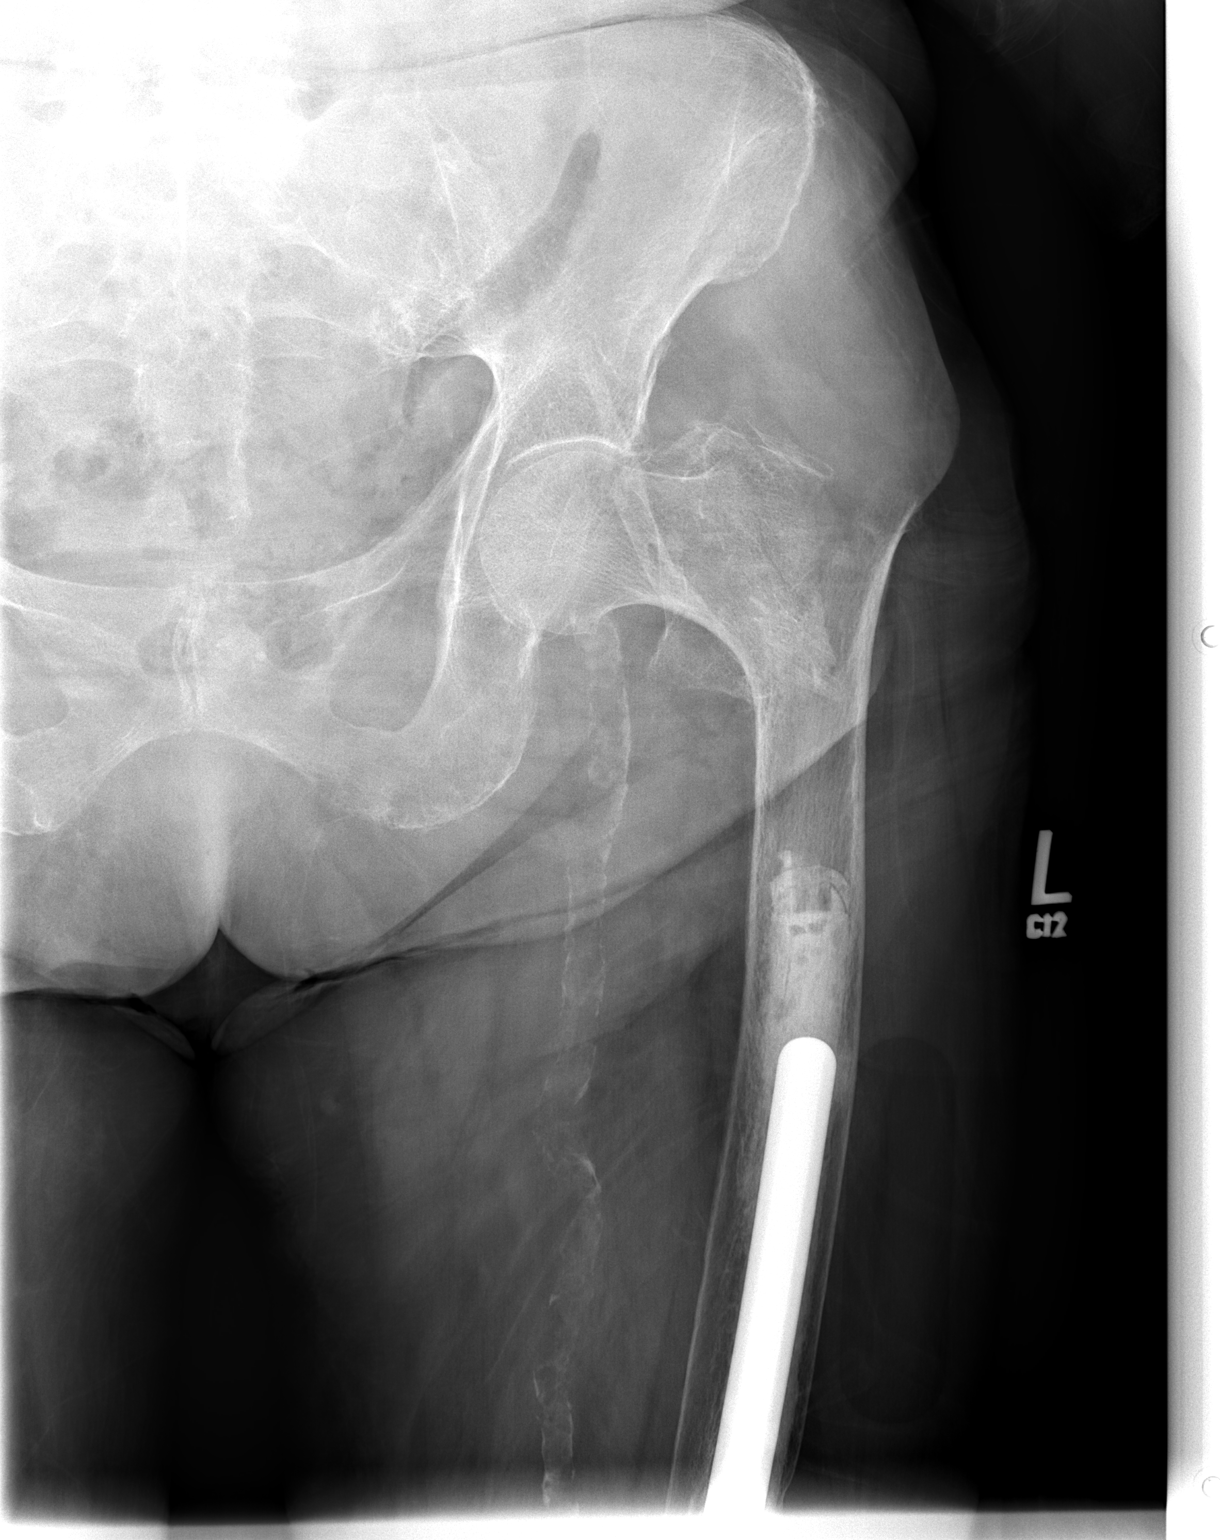

[2 of 2 positions shown; findings below may reference images not displayed]

FINDINGS: Comminuted left intertrochanteric femur fracture, mildly distracted
with mild varus deformity. No dislocation. Bony pelvis appears
intact. Diffuse osteopenia. Orthopedic hardware in the distal
femoral shaft is partially visualized. Patchy bilateral femoral
arterial calcifications.
IMPRESSION: 1. Comminuted left intertrochanteric femur fracture.
